# Patient Record
Sex: Female | Born: 1955
Health system: Southern US, Community
[De-identification: ages and names within clinical notes are randomized; demographics above are authoritative.]

## PROBLEM LIST (undated history)

## (undated) DIAGNOSIS — J309 Allergic rhinitis, unspecified: Secondary | ICD-10-CM

## (undated) DIAGNOSIS — K529 Noninfective gastroenteritis and colitis, unspecified: Secondary | ICD-10-CM

## (undated) DIAGNOSIS — K219 Gastro-esophageal reflux disease without esophagitis: Secondary | ICD-10-CM

## (undated) DIAGNOSIS — M459 Ankylosing spondylitis of unspecified sites in spine: Secondary | ICD-10-CM

## (undated) DIAGNOSIS — G4730 Sleep apnea, unspecified: Secondary | ICD-10-CM

## (undated) DIAGNOSIS — M2550 Pain in unspecified joint: Secondary | ICD-10-CM

## (undated) DIAGNOSIS — E669 Obesity, unspecified: Secondary | ICD-10-CM

## (undated) DIAGNOSIS — R252 Cramp and spasm: Secondary | ICD-10-CM

## (undated) DIAGNOSIS — R109 Unspecified abdominal pain: Secondary | ICD-10-CM

## (undated) DIAGNOSIS — Z9884 Bariatric surgery status: Secondary | ICD-10-CM

## (undated) DIAGNOSIS — D7289 Other specified disorders of white blood cells: Secondary | ICD-10-CM

## (undated) DIAGNOSIS — R599 Enlarged lymph nodes, unspecified: Secondary | ICD-10-CM

## (undated) DIAGNOSIS — Z8639 Personal history of other endocrine, nutritional and metabolic disease: Secondary | ICD-10-CM

## (undated) DIAGNOSIS — IMO0001 Reserved for inherently not codable concepts without codable children: Secondary | ICD-10-CM

## (undated) DIAGNOSIS — M1990 Unspecified osteoarthritis, unspecified site: Secondary | ICD-10-CM

## (undated) DIAGNOSIS — R079 Chest pain, unspecified: Secondary | ICD-10-CM

## (undated) DIAGNOSIS — Z5189 Encounter for other specified aftercare: Secondary | ICD-10-CM

## (undated) DIAGNOSIS — I10 Essential (primary) hypertension: Secondary | ICD-10-CM

## (undated) DIAGNOSIS — M549 Dorsalgia, unspecified: Secondary | ICD-10-CM

## (undated) DIAGNOSIS — K449 Diaphragmatic hernia without obstruction or gangrene: Secondary | ICD-10-CM

## (undated) DIAGNOSIS — M069 Rheumatoid arthritis, unspecified: Secondary | ICD-10-CM

## (undated) DIAGNOSIS — E041 Nontoxic single thyroid nodule: Secondary | ICD-10-CM

## (undated) DIAGNOSIS — G4733 Obstructive sleep apnea (adult) (pediatric): Principal | ICD-10-CM

## (undated) DIAGNOSIS — Z862 Personal history of diseases of the blood and blood-forming organs and certain disorders involving the immune mechanism: Secondary | ICD-10-CM

## (undated) DIAGNOSIS — K2970 Gastritis, unspecified, without bleeding: Secondary | ICD-10-CM

## (undated) DIAGNOSIS — E559 Vitamin D deficiency, unspecified: Secondary | ICD-10-CM

## (undated) DIAGNOSIS — S058X9A Other injuries of unspecified eye and orbit, initial encounter: Secondary | ICD-10-CM

## (undated) DIAGNOSIS — E162 Hypoglycemia, unspecified: Secondary | ICD-10-CM

## (undated) DIAGNOSIS — K921 Melena: Secondary | ICD-10-CM

## (undated) DIAGNOSIS — M450 Ankylosing spondylitis of multiple sites in spine: Secondary | ICD-10-CM

## (undated) DIAGNOSIS — K5900 Constipation, unspecified: Secondary | ICD-10-CM

## (undated) DIAGNOSIS — IMO0002 Reserved for concepts with insufficient information to code with codable children: Secondary | ICD-10-CM

## (undated) DIAGNOSIS — M79669 Pain in unspecified lower leg: Secondary | ICD-10-CM

## (undated) DIAGNOSIS — K589 Irritable bowel syndrome without diarrhea: Secondary | ICD-10-CM

## (undated) DIAGNOSIS — E785 Hyperlipidemia, unspecified: Secondary | ICD-10-CM

## (undated) DIAGNOSIS — J302 Other seasonal allergic rhinitis: Secondary | ICD-10-CM

## (undated) DIAGNOSIS — E739 Lactose intolerance, unspecified: Secondary | ICD-10-CM

## (undated) HISTORY — DX: Melena: K92.1

## (undated) HISTORY — DX: Diaphragmatic hernia without obstruction or gangrene: K44.9

## (undated) HISTORY — DX: Sleep apnea, unspecified: G47.30

## (undated) HISTORY — DX: Dorsalgia, unspecified: M54.9

## (undated) HISTORY — PX: UPPER GASTROINTESTINAL ENDOSCOPY: SHX188

## (undated) HISTORY — DX: Noninfective gastroenteritis and colitis, unspecified: K52.9

## (undated) HISTORY — DX: Pain in unspecified lower leg: M79.669

## (undated) HISTORY — DX: Cramp and spasm: R25.2

## (undated) HISTORY — DX: Unspecified osteoarthritis, unspecified site: M19.90

## (undated) HISTORY — DX: Personal history of diseases of the blood and blood-forming organs and certain disorders involving the immune mechanism: Z86.2

## (undated) HISTORY — DX: Allergic rhinitis, unspecified: J30.9

## (undated) HISTORY — DX: Reserved for concepts with insufficient information to code with codable children: IMO0002

## (undated) HISTORY — PX: TOTAL HIP ARTHROPLASTY: SHX124

## (undated) HISTORY — DX: Nontoxic single thyroid nodule: E04.1

## (undated) HISTORY — DX: Lactose intolerance, unspecified: E73.9

## (undated) HISTORY — DX: Ankylosing spondylitis of multiple sites in spine: M45.0

## (undated) HISTORY — DX: Hypoglycemia, unspecified: E16.2

## (undated) HISTORY — DX: Obstructive sleep apnea (adult) (pediatric): G47.33

## (undated) HISTORY — PX: BARIATRIC SURGERY: SHX1103

## (undated) HISTORY — DX: Hyperlipidemia, unspecified: E78.5

## (undated) HISTORY — DX: Essential (primary) hypertension: I10

## (undated) HISTORY — DX: Unspecified abdominal pain: R10.9

## (undated) HISTORY — DX: Chest pain, unspecified: R07.9

## (undated) HISTORY — DX: Rheumatoid arthritis, unspecified: M06.9

## (undated) HISTORY — DX: Other specified disorders of white blood cells: D72.89

## (undated) HISTORY — DX: Gastro-esophageal reflux disease without esophagitis: K21.9

## (undated) HISTORY — DX: Pain in unspecified joint: M25.50

## (undated) HISTORY — DX: Morbid (severe) obesity due to excess calories: E66.01

## (undated) HISTORY — DX: Irritable bowel syndrome without diarrhea: K58.9

## (undated) HISTORY — DX: Encounter for other specified aftercare: Z51.89

## (undated) HISTORY — PX: ESOPHAGOGASTRODUODENOSCOPY: SHX1529

## (undated) HISTORY — DX: Gastritis, unspecified, without bleeding: K29.70

## (undated) HISTORY — PX: COLONOSCOPY: SHX174

## (undated) HISTORY — DX: Ankylosing spondylitis of unspecified sites in spine: M45.9

## (undated) HISTORY — DX: Other seasonal allergic rhinitis: J30.2

## (undated) HISTORY — DX: Bariatric surgery status: Z98.84

## (undated) HISTORY — DX: Vitamin D deficiency, unspecified: E55.9

## (undated) HISTORY — DX: Other injuries of unspecified eye and orbit, initial encounter: S05.8X9A

## (undated) HISTORY — DX: Enlarged lymph nodes, unspecified: R59.9

## (undated) HISTORY — DX: Constipation, unspecified: K59.00

## (undated) HISTORY — DX: Reserved for inherently not codable concepts without codable children: IMO0001

## (undated) HISTORY — DX: Personal history of other endocrine, nutritional and metabolic disease: Z86.39

## (undated) HISTORY — DX: Obesity, unspecified: E66.9

---

## 1994-09-30 HISTORY — PX: BREAST REDUCTION SURGERY: SHX8

## 1998-03-29 ENCOUNTER — Emergency Department (HOSPITAL_COMMUNITY): Admission: EM | Admit: 1998-03-29 | Discharge: 1998-03-29 | Payer: Self-pay | Admitting: Emergency Medicine

## 1998-08-19 ENCOUNTER — Emergency Department (HOSPITAL_COMMUNITY): Admission: EM | Admit: 1998-08-19 | Discharge: 1998-08-19 | Payer: Self-pay | Admitting: Emergency Medicine

## 1998-10-10 ENCOUNTER — Ambulatory Visit (HOSPITAL_COMMUNITY): Admission: RE | Admit: 1998-10-10 | Discharge: 1998-10-10 | Payer: Self-pay | Admitting: Obstetrics & Gynecology

## 1998-10-10 ENCOUNTER — Encounter: Payer: Self-pay | Admitting: Obstetrics & Gynecology

## 1998-10-16 ENCOUNTER — Encounter: Payer: Self-pay | Admitting: Obstetrics & Gynecology

## 1998-10-16 ENCOUNTER — Ambulatory Visit (HOSPITAL_COMMUNITY): Admission: RE | Admit: 1998-10-16 | Discharge: 1998-10-16 | Payer: Self-pay | Admitting: Obstetrics & Gynecology

## 1998-10-20 ENCOUNTER — Encounter: Payer: Self-pay | Admitting: Obstetrics & Gynecology

## 1998-10-20 ENCOUNTER — Ambulatory Visit (HOSPITAL_COMMUNITY): Admission: RE | Admit: 1998-10-20 | Discharge: 1998-10-20 | Payer: Self-pay | Admitting: Obstetrics & Gynecology

## 1999-05-16 ENCOUNTER — Encounter: Payer: Self-pay | Admitting: Obstetrics & Gynecology

## 1999-05-16 ENCOUNTER — Ambulatory Visit (HOSPITAL_COMMUNITY): Admission: RE | Admit: 1999-05-16 | Discharge: 1999-05-16 | Payer: Self-pay | Admitting: Psychology

## 1999-11-20 ENCOUNTER — Ambulatory Visit (HOSPITAL_COMMUNITY): Admission: RE | Admit: 1999-11-20 | Discharge: 1999-11-20 | Payer: Self-pay | Admitting: Obstetrics & Gynecology

## 1999-11-20 ENCOUNTER — Encounter: Payer: Self-pay | Admitting: Obstetrics & Gynecology

## 2000-04-01 ENCOUNTER — Encounter: Payer: Self-pay | Admitting: Cardiology

## 2000-04-01 ENCOUNTER — Ambulatory Visit (HOSPITAL_COMMUNITY): Admission: RE | Admit: 2000-04-01 | Discharge: 2000-04-01 | Payer: Self-pay | Admitting: Cardiology

## 2000-10-23 ENCOUNTER — Ambulatory Visit (HOSPITAL_COMMUNITY): Admission: RE | Admit: 2000-10-23 | Discharge: 2000-10-23 | Payer: Self-pay | Admitting: Cardiology

## 2000-10-23 ENCOUNTER — Encounter: Payer: Self-pay | Admitting: Cardiology

## 2001-02-25 ENCOUNTER — Ambulatory Visit (HOSPITAL_COMMUNITY): Admission: RE | Admit: 2001-02-25 | Discharge: 2001-02-25 | Payer: Self-pay | Admitting: Obstetrics and Gynecology

## 2001-02-25 ENCOUNTER — Encounter: Payer: Self-pay | Admitting: Obstetrics and Gynecology

## 2001-04-15 ENCOUNTER — Emergency Department (HOSPITAL_COMMUNITY): Admission: EM | Admit: 2001-04-15 | Discharge: 2001-04-15 | Payer: Self-pay | Admitting: Emergency Medicine

## 2001-04-17 ENCOUNTER — Encounter: Payer: Self-pay | Admitting: Cardiology

## 2001-04-17 ENCOUNTER — Ambulatory Visit (HOSPITAL_COMMUNITY): Admission: RE | Admit: 2001-04-17 | Discharge: 2001-04-17 | Payer: Self-pay | Admitting: Cardiology

## 2001-04-25 ENCOUNTER — Encounter: Payer: Self-pay | Admitting: Cardiology

## 2001-04-25 ENCOUNTER — Encounter: Admission: RE | Admit: 2001-04-25 | Discharge: 2001-04-25 | Payer: Self-pay | Admitting: Cardiology

## 2001-05-01 ENCOUNTER — Encounter: Admission: RE | Admit: 2001-05-01 | Discharge: 2001-07-30 | Payer: Self-pay | Admitting: Cardiology

## 2001-05-04 ENCOUNTER — Other Ambulatory Visit: Admission: RE | Admit: 2001-05-04 | Discharge: 2001-05-04 | Payer: Self-pay | Admitting: Obstetrics and Gynecology

## 2001-06-13 ENCOUNTER — Emergency Department (HOSPITAL_COMMUNITY): Admission: EM | Admit: 2001-06-13 | Discharge: 2001-06-13 | Payer: Self-pay | Admitting: Emergency Medicine

## 2002-01-08 ENCOUNTER — Encounter: Payer: Self-pay | Admitting: Gastroenterology

## 2002-01-08 ENCOUNTER — Ambulatory Visit (HOSPITAL_COMMUNITY): Admission: RE | Admit: 2002-01-08 | Discharge: 2002-01-08 | Payer: Self-pay | Admitting: Gastroenterology

## 2002-06-04 ENCOUNTER — Other Ambulatory Visit: Admission: RE | Admit: 2002-06-04 | Discharge: 2002-06-04 | Payer: Self-pay | Admitting: Obstetrics and Gynecology

## 2002-07-12 ENCOUNTER — Encounter: Payer: Self-pay | Admitting: Obstetrics and Gynecology

## 2002-07-12 ENCOUNTER — Ambulatory Visit (HOSPITAL_COMMUNITY): Admission: RE | Admit: 2002-07-12 | Discharge: 2002-07-12 | Payer: Self-pay | Admitting: Obstetrics and Gynecology

## 2002-12-16 ENCOUNTER — Encounter: Payer: Self-pay | Admitting: Emergency Medicine

## 2002-12-16 ENCOUNTER — Emergency Department (HOSPITAL_COMMUNITY): Admission: EM | Admit: 2002-12-16 | Discharge: 2002-12-16 | Payer: Self-pay | Admitting: Emergency Medicine

## 2003-05-05 ENCOUNTER — Other Ambulatory Visit: Admission: RE | Admit: 2003-05-05 | Discharge: 2003-05-05 | Payer: Self-pay | Admitting: Obstetrics and Gynecology

## 2003-05-31 ENCOUNTER — Encounter: Admission: RE | Admit: 2003-05-31 | Discharge: 2003-08-29 | Payer: Self-pay | Admitting: Cardiology

## 2003-08-18 ENCOUNTER — Encounter: Admission: RE | Admit: 2003-08-18 | Discharge: 2003-09-21 | Payer: Self-pay | Admitting: Internal Medicine

## 2003-11-01 ENCOUNTER — Encounter: Admission: RE | Admit: 2003-11-01 | Discharge: 2003-11-01 | Payer: Self-pay | Admitting: Obstetrics and Gynecology

## 2004-04-30 ENCOUNTER — Ambulatory Visit (HOSPITAL_COMMUNITY): Admission: RE | Admit: 2004-04-30 | Discharge: 2004-04-30 | Payer: Self-pay | Admitting: General Surgery

## 2004-05-15 ENCOUNTER — Ambulatory Visit (HOSPITAL_COMMUNITY): Admission: RE | Admit: 2004-05-15 | Discharge: 2004-05-15 | Payer: Self-pay | Admitting: Obstetrics and Gynecology

## 2004-05-16 ENCOUNTER — Ambulatory Visit (HOSPITAL_COMMUNITY): Admission: RE | Admit: 2004-05-16 | Discharge: 2004-05-16 | Payer: Self-pay | Admitting: General Surgery

## 2004-05-16 ENCOUNTER — Encounter: Payer: Self-pay | Admitting: Cardiology

## 2004-05-21 ENCOUNTER — Encounter: Admission: RE | Admit: 2004-05-21 | Discharge: 2004-05-21 | Payer: Self-pay | Admitting: General Surgery

## 2004-08-16 ENCOUNTER — Emergency Department (HOSPITAL_COMMUNITY): Admission: EM | Admit: 2004-08-16 | Discharge: 2004-08-16 | Payer: Self-pay | Admitting: Family Medicine

## 2004-09-29 ENCOUNTER — Emergency Department (HOSPITAL_COMMUNITY): Admission: EM | Admit: 2004-09-29 | Discharge: 2004-09-29 | Payer: Self-pay | Admitting: Emergency Medicine

## 2004-10-09 ENCOUNTER — Other Ambulatory Visit: Admission: RE | Admit: 2004-10-09 | Discharge: 2004-10-09 | Payer: Self-pay | Admitting: Obstetrics and Gynecology

## 2005-01-30 ENCOUNTER — Ambulatory Visit: Payer: Self-pay | Admitting: Gastroenterology

## 2005-03-05 ENCOUNTER — Ambulatory Visit: Payer: Self-pay | Admitting: Gastroenterology

## 2005-06-13 ENCOUNTER — Emergency Department (HOSPITAL_COMMUNITY): Admission: EM | Admit: 2005-06-13 | Discharge: 2005-06-13 | Payer: Self-pay | Admitting: *Deleted

## 2005-06-14 ENCOUNTER — Emergency Department (HOSPITAL_COMMUNITY): Admission: EM | Admit: 2005-06-14 | Discharge: 2005-06-14 | Payer: Self-pay | Admitting: Emergency Medicine

## 2005-06-15 ENCOUNTER — Emergency Department (HOSPITAL_COMMUNITY): Admission: EM | Admit: 2005-06-15 | Discharge: 2005-06-16 | Payer: Self-pay | Admitting: Emergency Medicine

## 2005-06-18 ENCOUNTER — Encounter: Admission: RE | Admit: 2005-06-18 | Discharge: 2005-06-18 | Payer: Self-pay | Admitting: Internal Medicine

## 2005-07-07 ENCOUNTER — Emergency Department (HOSPITAL_COMMUNITY): Admission: EM | Admit: 2005-07-07 | Discharge: 2005-07-08 | Payer: Self-pay | Admitting: Emergency Medicine

## 2005-07-08 ENCOUNTER — Encounter: Admission: RE | Admit: 2005-07-08 | Discharge: 2005-09-29 | Payer: Self-pay | Admitting: General Surgery

## 2005-07-13 ENCOUNTER — Emergency Department (HOSPITAL_COMMUNITY): Admission: AD | Admit: 2005-07-13 | Discharge: 2005-07-13 | Payer: Self-pay | Admitting: Family Medicine

## 2005-07-14 ENCOUNTER — Emergency Department (HOSPITAL_COMMUNITY): Admission: EM | Admit: 2005-07-14 | Discharge: 2005-07-14 | Payer: Self-pay | Admitting: Emergency Medicine

## 2005-08-31 ENCOUNTER — Encounter: Admission: RE | Admit: 2005-08-31 | Discharge: 2005-08-31 | Payer: Self-pay | Admitting: Internal Medicine

## 2005-09-03 ENCOUNTER — Ambulatory Visit (HOSPITAL_COMMUNITY): Admission: RE | Admit: 2005-09-03 | Discharge: 2005-09-03 | Payer: Self-pay | Admitting: Internal Medicine

## 2005-10-28 ENCOUNTER — Emergency Department (HOSPITAL_COMMUNITY): Admission: EM | Admit: 2005-10-28 | Discharge: 2005-10-28 | Payer: Self-pay | Admitting: Emergency Medicine

## 2005-12-16 ENCOUNTER — Encounter: Admission: RE | Admit: 2005-12-16 | Discharge: 2005-12-16 | Payer: Self-pay | Admitting: Internal Medicine

## 2005-12-26 ENCOUNTER — Other Ambulatory Visit: Admission: RE | Admit: 2005-12-26 | Discharge: 2005-12-26 | Payer: Self-pay | Admitting: Obstetrics and Gynecology

## 2006-05-29 ENCOUNTER — Ambulatory Visit: Payer: Self-pay | Admitting: Family Medicine

## 2006-06-05 ENCOUNTER — Ambulatory Visit: Payer: Self-pay | Admitting: Family Medicine

## 2006-06-23 ENCOUNTER — Ambulatory Visit: Payer: Self-pay | Admitting: Family Medicine

## 2006-08-04 ENCOUNTER — Ambulatory Visit: Payer: Self-pay | Admitting: Family Medicine

## 2006-08-26 ENCOUNTER — Ambulatory Visit: Payer: Self-pay | Admitting: Cardiology

## 2006-08-27 ENCOUNTER — Ambulatory Visit: Payer: Self-pay | Admitting: Family Medicine

## 2006-08-27 LAB — CONVERTED CEMR LAB
ALT: 48 units/L — ABNORMAL HIGH (ref 0–40)
Albumin: 3.8 g/dL (ref 3.5–5.2)
Alkaline Phosphatase: 87 units/L (ref 39–117)
Hemoglobin: 13.2 g/dL (ref 12.0–15.0)
MCHC: 33.6 g/dL (ref 30.0–36.0)
Monocytes Absolute: 0.5 10*3/uL (ref 0.2–0.7)
Monocytes Relative: 12.9 % — ABNORMAL HIGH (ref 3.0–11.0)
Platelets: 242 10*3/uL (ref 150–400)
RBC: 4.05 M/uL (ref 3.87–5.11)
RDW: 11.9 % (ref 11.5–14.6)
Total Bilirubin: 0.9 mg/dL (ref 0.3–1.2)

## 2006-09-01 ENCOUNTER — Ambulatory Visit: Payer: Self-pay | Admitting: Family Medicine

## 2006-09-12 ENCOUNTER — Ambulatory Visit: Payer: Self-pay | Admitting: Oncology

## 2006-09-15 ENCOUNTER — Ambulatory Visit (HOSPITAL_COMMUNITY): Admission: RE | Admit: 2006-09-15 | Discharge: 2006-09-15 | Payer: Self-pay | Admitting: Obstetrics and Gynecology

## 2006-10-15 ENCOUNTER — Ambulatory Visit: Payer: Self-pay | Admitting: Endocrinology

## 2006-11-12 ENCOUNTER — Ambulatory Visit: Payer: Self-pay | Admitting: Oncology

## 2006-11-17 LAB — CBC WITH DIFFERENTIAL/PLATELET
Basophils Absolute: 0 10*3/uL (ref 0.0–0.1)
EOS%: 1.9 % (ref 0.0–7.0)
HCT: 39.1 % (ref 34.8–46.6)
HGB: 13.3 g/dL (ref 11.6–15.9)
MCH: 32.6 pg (ref 26.0–34.0)
MCV: 95.5 fL (ref 81.0–101.0)
MONO%: 12.2 % (ref 0.0–13.0)
NEUT%: 43 % (ref 39.6–76.8)
Platelets: 256 10*3/uL (ref 145–400)

## 2006-11-18 LAB — COMPREHENSIVE METABOLIC PANEL
Albumin: 4 g/dL (ref 3.5–5.2)
Alkaline Phosphatase: 100 U/L (ref 39–117)
BUN: 20 mg/dL (ref 6–23)
Glucose, Bld: 88 mg/dL (ref 70–99)
Potassium: 4.2 mEq/L (ref 3.5–5.3)
Total Bilirubin: 0.6 mg/dL (ref 0.3–1.2)

## 2006-11-18 LAB — ANA: Anti Nuclear Antibody(ANA): NEGATIVE

## 2006-12-10 ENCOUNTER — Ambulatory Visit: Payer: Self-pay | Admitting: Family Medicine

## 2007-02-03 ENCOUNTER — Ambulatory Visit: Payer: Self-pay | Admitting: Family Medicine

## 2007-02-04 ENCOUNTER — Ambulatory Visit: Payer: Self-pay | Admitting: Family Medicine

## 2007-02-04 LAB — CONVERTED CEMR LAB
Basophils Absolute: 0 10*3/uL (ref 0.0–0.1)
Bilirubin, Direct: 0.1 mg/dL (ref 0.0–0.3)
CO2: 30 meq/L (ref 19–32)
Eosinophils Absolute: 0.1 10*3/uL (ref 0.0–0.6)
GFR calc Af Amer: 169 mL/min
Glucose, Bld: 87 mg/dL (ref 70–99)
HCT: 38.6 % (ref 36.0–46.0)
Hemoglobin: 13.2 g/dL (ref 12.0–15.0)
Iron: 113 ug/dL (ref 42–145)
MCHC: 34.2 g/dL (ref 30.0–36.0)
MCV: 96.2 fL (ref 78.0–100.0)
Monocytes Absolute: 0.5 10*3/uL (ref 0.2–0.7)
Monocytes Relative: 10 % (ref 3.0–11.0)
Neutrophils Relative %: 35.1 % — ABNORMAL LOW (ref 43.0–77.0)
Potassium: 3.8 meq/L (ref 3.5–5.1)
RBC: 4.02 M/uL (ref 3.87–5.11)
Saturation Ratios: 27.9 % (ref 20.0–50.0)
Total Protein: 6.7 g/dL (ref 6.0–8.3)
Transferrin: 289.1 mg/dL (ref >=212.0)
Zinc: 769 (ref 600–1200)

## 2007-04-30 ENCOUNTER — Encounter: Admission: RE | Admit: 2007-04-30 | Discharge: 2007-06-30 | Payer: Self-pay | Admitting: General Surgery

## 2007-06-10 ENCOUNTER — Ambulatory Visit: Payer: Self-pay | Admitting: Family Medicine

## 2007-06-10 DIAGNOSIS — R599 Enlarged lymph nodes, unspecified: Secondary | ICD-10-CM | POA: Insufficient documentation

## 2007-06-10 DIAGNOSIS — Z862 Personal history of diseases of the blood and blood-forming organs and certain disorders involving the immune mechanism: Secondary | ICD-10-CM

## 2007-06-10 DIAGNOSIS — Z8639 Personal history of other endocrine, nutritional and metabolic disease: Secondary | ICD-10-CM

## 2007-06-10 HISTORY — DX: Enlarged lymph nodes, unspecified: R59.9

## 2007-06-10 HISTORY — DX: Personal history of diseases of the blood and blood-forming organs and certain disorders involving the immune mechanism: Z86.2

## 2007-06-10 HISTORY — DX: Personal history of diseases of the blood and blood-forming organs and certain disorders involving the immune mechanism: Z86.39

## 2007-06-26 ENCOUNTER — Encounter: Payer: Self-pay | Admitting: *Deleted

## 2007-06-26 DIAGNOSIS — K589 Irritable bowel syndrome without diarrhea: Secondary | ICD-10-CM

## 2007-06-26 DIAGNOSIS — K219 Gastro-esophageal reflux disease without esophagitis: Secondary | ICD-10-CM

## 2007-06-26 DIAGNOSIS — I10 Essential (primary) hypertension: Secondary | ICD-10-CM | POA: Insufficient documentation

## 2007-06-26 DIAGNOSIS — J309 Allergic rhinitis, unspecified: Secondary | ICD-10-CM | POA: Insufficient documentation

## 2007-06-26 DIAGNOSIS — E041 Nontoxic single thyroid nodule: Secondary | ICD-10-CM

## 2007-06-26 HISTORY — DX: Gastro-esophageal reflux disease without esophagitis: K21.9

## 2007-06-26 HISTORY — DX: Allergic rhinitis, unspecified: J30.9

## 2007-06-26 HISTORY — DX: Morbid (severe) obesity due to excess calories: E66.01

## 2007-06-26 HISTORY — DX: Nontoxic single thyroid nodule: E04.1

## 2007-06-26 HISTORY — DX: Irritable bowel syndrome, unspecified: K58.9

## 2007-07-06 ENCOUNTER — Encounter: Admission: RE | Admit: 2007-07-06 | Discharge: 2007-07-06 | Payer: Self-pay | Admitting: Family Medicine

## 2007-07-15 ENCOUNTER — Telehealth: Payer: Self-pay | Admitting: Family Medicine

## 2007-07-16 ENCOUNTER — Encounter: Payer: Self-pay | Admitting: Family Medicine

## 2007-11-05 ENCOUNTER — Ambulatory Visit (HOSPITAL_COMMUNITY): Admission: RE | Admit: 2007-11-05 | Discharge: 2007-11-05 | Payer: Self-pay | Admitting: Obstetrics and Gynecology

## 2008-01-14 ENCOUNTER — Ambulatory Visit: Payer: Self-pay | Admitting: Family Medicine

## 2008-01-14 DIAGNOSIS — Z9884 Bariatric surgery status: Secondary | ICD-10-CM

## 2008-01-14 HISTORY — DX: Bariatric surgery status: Z98.84

## 2008-01-19 ENCOUNTER — Encounter: Admission: RE | Admit: 2008-01-19 | Discharge: 2008-01-19 | Payer: Self-pay | Admitting: Family Medicine

## 2008-01-20 ENCOUNTER — Encounter (INDEPENDENT_AMBULATORY_CARE_PROVIDER_SITE_OTHER): Payer: Self-pay | Admitting: *Deleted

## 2008-01-20 ENCOUNTER — Telehealth (INDEPENDENT_AMBULATORY_CARE_PROVIDER_SITE_OTHER): Payer: Self-pay | Admitting: *Deleted

## 2008-01-25 ENCOUNTER — Ambulatory Visit: Payer: Self-pay | Admitting: Family Medicine

## 2008-01-27 ENCOUNTER — Encounter (INDEPENDENT_AMBULATORY_CARE_PROVIDER_SITE_OTHER): Payer: Self-pay | Admitting: *Deleted

## 2008-01-27 LAB — CONVERTED CEMR LAB
AST: 38 units/L — ABNORMAL HIGH (ref 0–37)
Albumin: 4.4 g/dL (ref 3.5–5.2)
BUN: 9 mg/dL (ref 6–23)
Chloride: 105 meq/L (ref 96–112)
Cholesterol: 153 mg/dL (ref 0–200)
Creatinine, Ser: 0.6 mg/dL (ref 0.4–1.2)
Free T4: 0.9 ng/dL (ref 0.6–1.6)
GFR calc non Af Amer: 113 mL/min
HDL: 92.4 mg/dL (ref >39.0)
MCHC: 33.3 g/dL (ref 30.0–36.0)
MCV: 97.1 fL (ref 78.0–100.0)
Platelets: 274 10*3/uL (ref 150–400)
RBC: 4.53 M/uL (ref 3.87–5.11)
Total Protein: 7.9 g/dL (ref 6.0–8.3)
VLDL: 12 mg/dL (ref 0–40)
Vit D, 1,25-Dihydroxy: 43 (ref 30–89)
Vitamin B-12: 1047 pg/mL — ABNORMAL HIGH (ref 211–911)

## 2008-01-28 ENCOUNTER — Telehealth (INDEPENDENT_AMBULATORY_CARE_PROVIDER_SITE_OTHER): Payer: Self-pay | Admitting: *Deleted

## 2008-01-29 ENCOUNTER — Ambulatory Visit: Payer: Self-pay | Admitting: Family Medicine

## 2008-02-09 ENCOUNTER — Encounter (INDEPENDENT_AMBULATORY_CARE_PROVIDER_SITE_OTHER): Payer: Self-pay | Admitting: *Deleted

## 2008-02-10 ENCOUNTER — Encounter: Payer: Self-pay | Admitting: Internal Medicine

## 2008-02-10 ENCOUNTER — Telehealth (INDEPENDENT_AMBULATORY_CARE_PROVIDER_SITE_OTHER): Payer: Self-pay | Admitting: *Deleted

## 2008-02-13 LAB — CONVERTED CEMR LAB
Basophils Absolute: 0 10*3/uL (ref 0.0–0.1)
Eosinophils Absolute: 0.1 10*3/uL (ref 0.0–0.7)
HCT: 40.8 % (ref 36.0–46.0)
MCHC: 33.2 g/dL (ref 30.0–36.0)
MCV: 97.9 fL (ref 78.0–100.0)
Monocytes Absolute: 0.5 10*3/uL (ref 0.1–1.0)
Platelets: 267 10*3/uL (ref 150–400)
RDW: 12 % (ref 11.5–14.6)

## 2008-02-15 ENCOUNTER — Encounter (INDEPENDENT_AMBULATORY_CARE_PROVIDER_SITE_OTHER): Payer: Self-pay | Admitting: *Deleted

## 2008-03-01 ENCOUNTER — Encounter: Payer: Self-pay | Admitting: Family Medicine

## 2008-03-24 ENCOUNTER — Encounter: Payer: Self-pay | Admitting: Family Medicine

## 2008-03-24 ENCOUNTER — Encounter: Admission: RE | Admit: 2008-03-24 | Discharge: 2008-03-24 | Payer: Self-pay | Admitting: Endocrinology

## 2008-03-24 ENCOUNTER — Encounter (INDEPENDENT_AMBULATORY_CARE_PROVIDER_SITE_OTHER): Payer: Self-pay | Admitting: Interventional Radiology

## 2008-03-24 ENCOUNTER — Other Ambulatory Visit: Admission: RE | Admit: 2008-03-24 | Discharge: 2008-03-24 | Payer: Self-pay | Admitting: Interventional Radiology

## 2008-03-31 ENCOUNTER — Ambulatory Visit: Payer: Self-pay | Admitting: Internal Medicine

## 2008-03-31 DIAGNOSIS — R109 Unspecified abdominal pain: Secondary | ICD-10-CM

## 2008-03-31 DIAGNOSIS — IMO0001 Reserved for inherently not codable concepts without codable children: Secondary | ICD-10-CM

## 2008-03-31 DIAGNOSIS — M459 Ankylosing spondylitis of unspecified sites in spine: Secondary | ICD-10-CM | POA: Insufficient documentation

## 2008-03-31 HISTORY — DX: Unspecified abdominal pain: R10.9

## 2008-03-31 HISTORY — DX: Reserved for inherently not codable concepts without codable children: IMO0001

## 2008-03-31 HISTORY — DX: Ankylosing spondylitis of unspecified sites in spine: M45.9

## 2008-03-31 LAB — CONVERTED CEMR LAB
Bilirubin Urine: NEGATIVE
Blood in Urine, dipstick: NEGATIVE
Glucose, Urine, Semiquant: NEGATIVE
Ketones, urine, test strip: NEGATIVE
Nitrite: NEGATIVE
Protein, U semiquant: NEGATIVE
Specific Gravity, Urine: 1.02
Urobilinogen, UA: 0.2
pH: 6

## 2008-04-01 ENCOUNTER — Encounter: Payer: Self-pay | Admitting: Internal Medicine

## 2008-04-04 ENCOUNTER — Encounter (INDEPENDENT_AMBULATORY_CARE_PROVIDER_SITE_OTHER): Payer: Self-pay | Admitting: *Deleted

## 2008-04-20 ENCOUNTER — Encounter: Admission: RE | Admit: 2008-04-20 | Discharge: 2008-06-21 | Payer: Self-pay | Admitting: General Surgery

## 2008-05-09 ENCOUNTER — Encounter: Payer: Self-pay | Admitting: Family Medicine

## 2008-05-10 ENCOUNTER — Encounter: Payer: Self-pay | Admitting: Internal Medicine

## 2008-06-04 ENCOUNTER — Emergency Department (HOSPITAL_BASED_OUTPATIENT_CLINIC_OR_DEPARTMENT_OTHER): Admission: EM | Admit: 2008-06-04 | Discharge: 2008-06-04 | Payer: Self-pay | Admitting: Emergency Medicine

## 2008-08-17 ENCOUNTER — Telehealth: Payer: Self-pay | Admitting: Family Medicine

## 2008-09-07 ENCOUNTER — Encounter: Payer: Self-pay | Admitting: Family Medicine

## 2008-09-12 ENCOUNTER — Inpatient Hospital Stay (HOSPITAL_COMMUNITY): Admission: RE | Admit: 2008-09-12 | Discharge: 2008-09-16 | Payer: Self-pay | Admitting: Orthopedic Surgery

## 2008-09-24 ENCOUNTER — Inpatient Hospital Stay (HOSPITAL_COMMUNITY): Admission: EM | Admit: 2008-09-24 | Discharge: 2008-10-04 | Payer: Self-pay | Admitting: Emergency Medicine

## 2008-09-24 ENCOUNTER — Encounter (INDEPENDENT_AMBULATORY_CARE_PROVIDER_SITE_OTHER): Payer: Self-pay | Admitting: Emergency Medicine

## 2008-09-24 ENCOUNTER — Ambulatory Visit: Payer: Self-pay | Admitting: Vascular Surgery

## 2008-09-29 ENCOUNTER — Ambulatory Visit: Payer: Self-pay | Admitting: Physical Medicine & Rehabilitation

## 2008-09-30 HISTORY — PX: OTHER SURGICAL HISTORY: SHX169

## 2008-12-02 ENCOUNTER — Ambulatory Visit: Payer: Self-pay | Admitting: Family Medicine

## 2008-12-02 DIAGNOSIS — K921 Melena: Secondary | ICD-10-CM

## 2008-12-02 HISTORY — DX: Melena: K92.1

## 2008-12-03 LAB — CONVERTED CEMR LAB
AST: 29 units/L (ref 0–37)
Albumin: 3.9 g/dL (ref 3.5–5.2)
Alkaline Phosphatase: 83 units/L (ref 39–117)
BUN: 8 mg/dL (ref 6–23)
Bilirubin, Direct: 0.1 mg/dL (ref 0.0–0.3)
Chloride: 104 meq/L (ref 96–112)
Eosinophils Absolute: 0.1 10*3/uL (ref 0.0–0.7)
Eosinophils Relative: 2.1 % (ref 0.0–5.0)
GFR calc Af Amer: 168 mL/min
GFR calc non Af Amer: 139 mL/min
Glucose, Bld: 86 mg/dL (ref 70–99)
Monocytes Relative: 9.5 % (ref 3.0–12.0)
Neutrophils Relative %: 46.6 % (ref 43.0–77.0)
Platelets: 248 10*3/uL (ref 150–400)
Potassium: 3.8 meq/L (ref 3.5–5.1)
RDW: 13.6 % (ref 11.5–14.6)
Sodium: 140 meq/L (ref 135–145)
WBC: 4.4 10*3/uL — ABNORMAL LOW (ref 4.5–10.5)

## 2008-12-05 ENCOUNTER — Encounter (INDEPENDENT_AMBULATORY_CARE_PROVIDER_SITE_OTHER): Payer: Self-pay | Admitting: *Deleted

## 2008-12-14 ENCOUNTER — Telehealth: Payer: Self-pay | Admitting: Family Medicine

## 2009-01-06 ENCOUNTER — Telehealth (INDEPENDENT_AMBULATORY_CARE_PROVIDER_SITE_OTHER): Payer: Self-pay | Admitting: *Deleted

## 2009-01-24 ENCOUNTER — Encounter: Payer: Self-pay | Admitting: Family Medicine

## 2009-01-30 ENCOUNTER — Ambulatory Visit: Payer: Self-pay | Admitting: Family Medicine

## 2009-01-30 DIAGNOSIS — E162 Hypoglycemia, unspecified: Secondary | ICD-10-CM | POA: Insufficient documentation

## 2009-02-03 ENCOUNTER — Emergency Department (HOSPITAL_COMMUNITY): Admission: EM | Admit: 2009-02-03 | Discharge: 2009-02-03 | Payer: Self-pay | Admitting: Family Medicine

## 2009-03-01 ENCOUNTER — Telehealth (INDEPENDENT_AMBULATORY_CARE_PROVIDER_SITE_OTHER): Payer: Self-pay | Admitting: *Deleted

## 2009-03-17 ENCOUNTER — Ambulatory Visit: Payer: Self-pay | Admitting: Family Medicine

## 2009-03-22 ENCOUNTER — Ambulatory Visit: Payer: Self-pay | Admitting: Family Medicine

## 2009-03-29 ENCOUNTER — Emergency Department (HOSPITAL_COMMUNITY): Admission: EM | Admit: 2009-03-29 | Discharge: 2009-03-29 | Payer: Self-pay | Admitting: Family Medicine

## 2009-03-29 ENCOUNTER — Telehealth (INDEPENDENT_AMBULATORY_CARE_PROVIDER_SITE_OTHER): Payer: Self-pay | Admitting: *Deleted

## 2009-03-30 ENCOUNTER — Encounter (INDEPENDENT_AMBULATORY_CARE_PROVIDER_SITE_OTHER): Payer: Self-pay | Admitting: *Deleted

## 2009-03-30 LAB — CONVERTED CEMR LAB
ALT: 41 units/L — ABNORMAL HIGH (ref 0–35)
Alkaline Phosphatase: 78 units/L (ref 39–117)
BUN: 7 mg/dL (ref 6–23)
Basophils Relative: 0.1 % (ref 0.0–3.0)
Bilirubin, Direct: 0 mg/dL (ref 0.0–0.3)
Calcium: 9.3 mg/dL (ref 8.4–10.5)
Chloride: 102 meq/L (ref 96–112)
Cholesterol: 132 mg/dL (ref 0–200)
Creatinine, Ser: 0.5 mg/dL (ref 0.4–1.2)
Eosinophils Relative: 2.2 % (ref 0.0–5.0)
GFR calc non Af Amer: 167.61 mL/min (ref >60)
H Pylori IgG: NEGATIVE
LDL Cholesterol: 40 mg/dL (ref 0–99)
Lymphocytes Relative: 47.7 % — ABNORMAL HIGH (ref 12.0–46.0)
MCV: 96.3 fL (ref 78.0–100.0)
Monocytes Absolute: 0.4 10*3/uL (ref 0.1–1.0)
Monocytes Relative: 8.6 % (ref 3.0–12.0)
Neutrophils Relative %: 41.4 % — ABNORMAL LOW (ref 43.0–77.0)
Platelets: 248 10*3/uL (ref 150.0–400.0)
RBC: 3.96 M/uL (ref 3.87–5.11)
Saturation Ratios: 27.9 % (ref 20.0–50.0)
Total Bilirubin: 0.8 mg/dL (ref 0.3–1.2)
Total CHOL/HDL Ratio: 2
Total Protein: 7.7 g/dL (ref 6.0–8.3)
Transferrin: 271.7 mg/dL (ref 212.0–360.0)
Triglycerides: 50 mg/dL (ref 0.0–149.0)
VLDL: 10 mg/dL (ref 0.0–40.0)
Vitamin B-12: 495 pg/mL (ref 211–911)
WBC: 4.4 10*3/uL — ABNORMAL LOW (ref 4.5–10.5)

## 2009-04-25 ENCOUNTER — Encounter: Admission: RE | Admit: 2009-04-25 | Discharge: 2009-07-11 | Payer: Self-pay | Admitting: Orthopaedic Surgery

## 2009-06-07 ENCOUNTER — Ambulatory Visit: Payer: Self-pay | Admitting: Diagnostic Radiology

## 2009-06-07 ENCOUNTER — Emergency Department (HOSPITAL_BASED_OUTPATIENT_CLINIC_OR_DEPARTMENT_OTHER): Admission: EM | Admit: 2009-06-07 | Discharge: 2009-06-07 | Payer: Self-pay | Admitting: Emergency Medicine

## 2009-08-01 ENCOUNTER — Encounter: Admission: RE | Admit: 2009-08-01 | Discharge: 2009-09-14 | Payer: Self-pay | Admitting: Orthopaedic Surgery

## 2009-09-07 ENCOUNTER — Encounter: Payer: Self-pay | Admitting: Family Medicine

## 2009-09-20 ENCOUNTER — Telehealth: Payer: Self-pay | Admitting: Family Medicine

## 2009-10-11 ENCOUNTER — Ambulatory Visit: Payer: Self-pay | Admitting: Gastroenterology

## 2009-10-11 DIAGNOSIS — R079 Chest pain, unspecified: Secondary | ICD-10-CM

## 2009-10-11 HISTORY — DX: Chest pain, unspecified: R07.9

## 2009-10-17 ENCOUNTER — Ambulatory Visit (HOSPITAL_COMMUNITY): Admission: RE | Admit: 2009-10-17 | Discharge: 2009-10-17 | Payer: Self-pay | Admitting: Obstetrics and Gynecology

## 2009-10-19 ENCOUNTER — Telehealth: Payer: Self-pay | Admitting: Gastroenterology

## 2009-10-23 ENCOUNTER — Telehealth (INDEPENDENT_AMBULATORY_CARE_PROVIDER_SITE_OTHER): Payer: Self-pay | Admitting: *Deleted

## 2009-10-23 ENCOUNTER — Telehealth: Payer: Self-pay | Admitting: Gastroenterology

## 2009-10-24 ENCOUNTER — Ambulatory Visit: Payer: Self-pay | Admitting: Gastroenterology

## 2009-10-24 HISTORY — PX: COLONOSCOPY: SHX174

## 2010-01-22 ENCOUNTER — Ambulatory Visit: Payer: Self-pay | Admitting: Family Medicine

## 2010-01-22 DIAGNOSIS — S058X9A Other injuries of unspecified eye and orbit, initial encounter: Secondary | ICD-10-CM

## 2010-01-22 HISTORY — DX: Other injuries of unspecified eye and orbit, initial encounter: S05.8X9A

## 2010-01-23 ENCOUNTER — Telehealth: Payer: Self-pay | Admitting: Family Medicine

## 2010-04-06 ENCOUNTER — Ambulatory Visit: Payer: Self-pay | Admitting: Family Medicine

## 2010-04-06 DIAGNOSIS — K5900 Constipation, unspecified: Secondary | ICD-10-CM

## 2010-04-06 HISTORY — DX: Constipation, unspecified: K59.00

## 2010-05-08 ENCOUNTER — Encounter: Admission: RE | Admit: 2010-05-08 | Discharge: 2010-06-25 | Payer: Self-pay | Admitting: Orthopaedic Surgery

## 2010-06-22 ENCOUNTER — Ambulatory Visit: Payer: Self-pay | Admitting: Family Medicine

## 2010-06-27 ENCOUNTER — Ambulatory Visit: Payer: Self-pay | Admitting: Family Medicine

## 2010-06-27 LAB — CONVERTED CEMR LAB
Bilirubin Urine: NEGATIVE
Glucose, Urine, Semiquant: NEGATIVE
Protein, U semiquant: NEGATIVE
Urobilinogen, UA: 0.2
WBC Urine, dipstick: NEGATIVE
pH: 6

## 2010-07-02 LAB — CONVERTED CEMR LAB
Alkaline Phosphatase: 94 units/L (ref 39–117)
Basophils Absolute: 0 10*3/uL (ref 0.0–0.1)
Bilirubin, Direct: 0.1 mg/dL (ref 0.0–0.3)
Calcium: 9.1 mg/dL (ref 8.4–10.5)
GFR calc non Af Amer: 140.52 mL/min (ref >60)
HCT: 41.4 % (ref 36.0–46.0)
HDL: 83.4 mg/dL (ref >39.00)
LDL Cholesterol: 60 mg/dL (ref 0–99)
Lymphs Abs: 1.9 10*3/uL (ref 0.7–4.0)
MCV: 98.7 fL (ref 78.0–100.0)
Monocytes Absolute: 0.3 10*3/uL (ref 0.1–1.0)
Neutrophils Relative %: 32.4 % — ABNORMAL LOW (ref 43.0–77.0)
Platelets: 263 10*3/uL (ref 150.0–400.0)
Potassium: 4 meq/L (ref 3.5–5.1)
RDW: 12.8 % (ref 11.5–14.6)
Sodium: 141 meq/L (ref 135–145)
TSH: 1.32 microintl units/mL (ref 0.35–5.50)
Total Bilirubin: 0.6 mg/dL (ref 0.3–1.2)
Total CHOL/HDL Ratio: 2
Transferrin: 328.6 mg/dL (ref 212.0–360.0)
VLDL: 9.6 mg/dL (ref 0.0–40.0)
Vit D, 25-Hydroxy: 36 ng/mL

## 2010-07-23 ENCOUNTER — Ambulatory Visit: Payer: Self-pay | Admitting: Family Medicine

## 2010-07-23 DIAGNOSIS — D7289 Other specified disorders of white blood cells: Secondary | ICD-10-CM

## 2010-07-23 HISTORY — DX: Other specified disorders of white blood cells: D72.89

## 2010-07-25 LAB — CONVERTED CEMR LAB
Basophils Relative: 0.7 % (ref 0.0–3.0)
Eosinophils Relative: 1.5 % (ref 0.0–5.0)
Hemoglobin: 14.1 g/dL (ref 12.0–15.0)
MCV: 97.7 fL (ref 78.0–100.0)
Monocytes Absolute: 0.3 10*3/uL (ref 0.1–1.0)
Neutrophils Relative %: 43.4 % (ref 43.0–77.0)
RBC: 4.27 M/uL (ref 3.87–5.11)
WBC: 4.2 10*3/uL — ABNORMAL LOW (ref 4.5–10.5)

## 2010-10-21 ENCOUNTER — Encounter: Payer: Self-pay | Admitting: Endocrinology

## 2010-10-21 ENCOUNTER — Encounter: Payer: Self-pay | Admitting: Obstetrics and Gynecology

## 2010-10-21 ENCOUNTER — Encounter: Payer: Self-pay | Admitting: Gastroenterology

## 2010-10-22 ENCOUNTER — Encounter: Payer: Self-pay | Admitting: Internal Medicine

## 2010-11-01 NOTE — Assessment & Plan Note (Signed)
Summary: PAIN IN CHEST AREA/NON CARDIAC/YF   History of Present Illness Visit Type: Initial Consult Primary GI MD: Melvia Heaps MD [facility] Primary Provider: Loreen Freud, MD Chief Complaint: Patient c/o intermittent chest discomfort since having bariatric surgery. She c/o increased burping as well. She denies any other GI symptoms. History of Present Illness:   [name] has returned for evaluation of chest pain.  She has been complaining of intermittent burning chest discomfort.  She denies dysphagia.  She has a history of GERD and an esophageal stricture which has been dilated in the past.  She has taken Nexium intermittently with relief.  Chest discomfort is relieved with belching.  She is status post gastric bypass surgery in 2006   GI Review of Systems    Reports belching, chest pain, and  weight gain.      Denies abdominal pain, acid reflux, bloating, dysphagia with liquids, dysphagia with solids, heartburn, loss of appetite, nausea, vomiting, vomiting blood, and  weight loss.      Reports constipation and  rectal bleeding.     Denies anal fissure, black tarry stools, change in bowel habit, diarrhea, diverticulosis, fecal incontinence, heme positive stool, hemorrhoids, irritable bowel syndrome, jaundice, light color stool, liver problems, and  rectal pain. Preventive Screening-Counseling & Management  Alcohol-Tobacco     Smoking Status: never      Drug Use:  no.      Current Medications (verified): 1)  One Touch Ultra Mini Strips and Lancets .... Check Bs Once Daily 2)  Tylenol Extra Strength 500 Mg Tabs (Acetaminophen) .... Take 2-4 Tablets By Mouth As Needed 3)  Centrum  Tabs (Multiple Vitamins-Minerals) .... Take 1 Tablet By Mouth Once A Day 4)  Calcium (Unknown Dosage) .... Take 1 Tablet By Mouth Once A Day 5)  Potassium (Unknown Dosage) .... Take 1 Tablet By Mouth Once A Day 6)  Vitamin D (Unknown Dosage) .... Take 1 Tablet By Mouth Once A Day  Allergies (verified): 1)   ! Codeine 2)  ! Morphine 3)  ! Sulfa 4)  ! Jonne Ply  Past History:  Past Medical History: Reviewed history from 04/05/2009 and no changes required. 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  Alcohol Use - no Daily Caffeine Use- 1 cup daily Illicit Drug Use - no Drug Use:  no  Review of Systems       The patient complains of arthritis/joint pain, heart rhythm changes, and night sweats.         All other systems were reviewed and were negative   Vital Signs:  Patient profile:   55 year old female Height:      61 inches Weight:      168 pounds BMI:     31.86 BSA:     1.76 Pulse rate:   80 / minute Pulse rhythm:   regular BP sitting:   120 / 78  (left arm)  Vitals Entered By: Hortense Ramal CMA Duncan Dull) (October 11, 2009 3:58 PM)  Physical Exam  Additional Exam:  She is a well-developed well-nourished female  skin: anicteric HEENT: normocephalic; PEERLA; no nasal or pharyngeal abnormalities neck: supple nodes: no cervical lymphadenopathy chest: clear to ausculatation and percussion heart: no murmurs, gallops, or rubs abd: soft, nontender; BS normoactive; no abdominal masses, tenderness, organomegaly rectal: deferred ext: no cynanosis, clubbing, edema skeletal: no deformities neuro: oriented x 3; no focal abnormalities    Impression & Recommendations:  Problem  # 1:  CHEST PAIN UNSPECIFIED (ICD-786.50) Symptoms are most likely related to GERD.  Recommendations #1 the patient was instructed to use Nexium daily regardless of symptoms.  If not improved I would consider upper endoscopy.  Problem # 2:  SPECIAL SCREENING FOR MALIGNANT NEOPLASMS COLON (ICD-V76.51)  Patient will be scheduled for screening colonoscopy  Orders: Colonoscopy (Colon)  Patient Instructions: 1)  Your Colonoscopy is scheduled for 10/24/2009 at 8am 2)  You can pick up your MoviPrep from your pharmacy today 3)  The medication list was reviewed and reconciled.  All changed / newly prescribed medications were explained.  A complete medication list was provided to the patient / caregiver. Prescriptions: MOVIPREP 100 GM  SOLR (PEG-KCL-NACL-NASULF-NA ASC-C) As per prep instructions.  #1 x 0   Entered by:   Merri Ray CMA (AAMA)   Authorized by:   Louis Meckel MD   Signed by:   Merri Ray CMA (AAMA) on 10/11/2009   Method used:   Electronically to        [facility]  [address] [phone]* (retail)       [address]       [place], [place]  [id number]       Ph: [phone] or [phone]       Fax: [phone]   RxID:   [id number] NEXIUM 40 MG CPDR (ESOMEPRAZOLE MAGNESIUM) take one tab daily  #30 x 2   Entered and Authorized by:   Louis Meckel MD   Signed by:   Louis Meckel MD on 10/11/2009   Method used:   [facility]ically to        [facility]  [address] [phone]* (retail)       [address]       [place], [place]  [id number]       Ph: [phone] or [phone]       Fax: [phone]   RxID:   [id number]

## 2010-11-01 NOTE — Progress Notes (Signed)
Summary: Prep Questions  Phone Note Call from Patient Call back at Work Phone [phone]   Caller: Patient Call For: Dr. Arlyce Dice Reason for Call: Talk to Nurse Summary of Call: Pt has an appt. scheduled for a Colonoscopy on 10-24-09 and has had gastic bypass surgery. Cannot drink alot of fluids and wants to know if there is another option she can do. Initial call taken by: Karna Christmas,  October 19, 2009 9:24 AM  Follow-up for Phone Call        Explained moviprep and pt is okay to start it a little earlier to get it all down in time. Follow-up by: Clide Cliff RN,  October 19, 2009 11:29 AM

## 2010-11-01 NOTE — Assessment & Plan Note (Signed)
Summary: cpx/cbs   Vital Signs:  Patient profile:   55 year old female Height:      62.5 inches Weight:      180.8 pounds Temp:     98.7 degrees F oral Pulse rate:   68 / minute Pulse rhythm:   regular BP sitting:   130 / 86  (left arm)  Vitals Entered By: Almeta Monas CMA Con Arganbright Dull) (June 22, 2010 2:05 PM) CC: CPX, No PAP needed and not fasting   History of Present Illness: Pt here for cpe, no pap --and labs will be done another day.    Preventive Screening-Counseling & Management  Alcohol-Tobacco     Alcohol drinks/day: 0     Smoking Status: never  Caffeine-Diet-Exercise     Caffeine use/day: 2     Does Patient Exercise: no     Exercise Counseling: to improve exercise regimen  Hep-HIV-STD-Contraception     Dental Visit-last 6 months yes     Dental Care Counseling: not indicated; dental care within six months     SBE monthly: no     SBE Education/Counseling: to perform regular SBE      Sexual History:  currently monogamous and married.    Current Medications (verified): 1)  One Touch Ultra Mini Strips and Lancets .... Check Bs Once Daily 2)  Tylenol Extra Strength 500 Mg Tabs (Acetaminophen) .... Take 2-4 Tablets By Mouth As Needed 3)  Centrum  Tabs (Multiple Vitamins-Minerals) .... Take 1 Tablet By Mouth Once A Day 4)  Calcium (Unknown Dosage) .... Take 1 Tablet By Mouth Once A Day 5)  Potassium (Unknown Dosage) .... Take 1 Tablet By Mouth Once A Day 6)  Vitamin D (Unknown Dosage) .... Take 1 Tablet By Mouth Once A Day 7)  Nexium 40 Mg Cpdr (Esomeprazole Magnesium) .... Take One Tab Daily 8)  Adipex-P 37.5 Mg Caps (Phentermine Hcl) .Marland Kitchen.. 1 By Mouth Once Daily  Allergies (verified): 1)  ! Codeine 2)  ! Morphine 3)  ! Sulfa 4)  ! 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  Alcohol Use - no Daily Caffeine Use- 1 cup daily Illicit Drug Use - no  Risk Factors: Alcohol Use: 0 (06/22/2010) Caffeine Use: 2 (06/22/2010) Exercise: no (06/22/2010)  Risk Factors: Smoking Status: never (06/22/2010)  Past Surgical History: EGD(03/05/2005) Breast surgery reduction 1996 Left Total hip replacement x 2 Left Hip replacement revision (1st hip failed due to cracked pelvis) Bariatric Surgery EGD 04/2010 colon 01/2010  Family History: Reviewed history from 10/11/2009 and no changes required. Father: LIVING Mother: LIVING Siblings: 1 LIVING Family History of Melanoma-MGM Family History Breast cancer 1st degree relative <50-MGM, Aunt Family History of Arthritis-FATHER Family History Hypertension: MOTHER, FATHER, SISTER Family History Diabetes 1st degree relative: FATHER,SISTER No FH of Colon Cancer:  Social History: Reviewed history from 10/11/2009 and no changes required. 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  Alcohol Use - no Daily Caffeine Use- 1 cup daily Illicit Drug Use - no Does Patient Exercise:  no Caffeine use/day:  2 Dental Care w/in 6 mos.:  yes Sexual History:  currently monogamous, married  Review of Systems      See HPI General:  Denies chills, fatigue, fever, loss of appetite, malaise, sleep disorder, sweats, weakness, and weight loss. Eyes:  Denies blurring, discharge, double vision, eye irritation, eye pain, halos, itching, light sensitivity, red eye, vision loss-1 eye, and vision loss-both eyes; optho q1y. ENT:  Denies decreased hearing, difficulty swallowing, ear  discharge, earache, hoarseness, nasal congestion, nosebleeds, postnasal drainage, ringing in ears, sinus pressure, and sore throat. CV:  Denies bluish discoloration of lips or nails, chest pain or discomfort, difficulty breathing at night, difficulty breathing while lying down, fainting, fatigue, leg cramps with exertion, lightheadness, near fainting, palpitations, shortness of breath with exertion, swelling of feet, swelling of hands, and weight gain. Resp:  Denies chest discomfort, chest pain with inspiration, cough, coughing up blood, excessive snoring, hypersomnolence, morning headaches, pleuritic, shortness of breath, sputum productive, and wheezing. GI:  Denies abdominal pain, bloody stools, change in bowel habits, constipation, dark tarry stools, diarrhea, excessive appetite, gas, hemorrhoids, indigestion, loss of appetite, nausea, vomiting, vomiting blood, and yellowish skin color. GU:  Denies abnormal vaginal bleeding, decreased libido, discharge, dysuria, genital sores, hematuria, incontinence, nocturia, urinary frequency, and urinary hesitancy. MS:  Denies joint pain, joint redness, joint swelling, loss of strength, low back pain, mid back pain, muscle aches, muscle , cramps, muscle weakness, stiffness, and thoracic pain. Derm:  Denies changes in color of skin, changes in nail beds, dryness, excessive perspiration, flushing, hair loss, insect bite(s), itching, lesion(s), poor wound healing, and rash. Neuro:  Complains of memory loss; denies brief paralysis, difficulty with concentration, disturbances in coordination, falling down, headaches, inability to speak, numbness, poor balance, seizures, sensation of room spinning, tingling, tremors, visual disturbances, and weakness. Psych:  Denies alternate hallucination ( auditory/visual), anxiety, depression, easily angered, easily tearful, irritability, mental problems, panic attacks, sense of great danger, suicidal thoughts/plans, thoughts of  violence, unusual visions or sounds, and thoughts /plans of harming others. Heme:  Denies abnormal bruising, bleeding, enlarge lymph nodes, fevers, pallor, and skin discoloration. Allergy:  Denies hives or rash, itching eyes, persistent infections, seasonal allergies, and sneezing.  Physical Exam  General:  Well-developed,well-nourished,in no acute distress; alert,appropriate and cooperative throughout examination Head:  Normocephalic and atraumatic without obvious abnormalities. No apparent alopecia or balding. Eyes:  pupils equal, pupils round, pupils reactive to light, and no injection.   Ears:  External ear exam shows no significant lesions or deformities.  Otoscopic examination reveals clear canals, tympanic membranes are intact bilaterally without bulging, retraction, inflammation or discharge. Hearing is grossly normal bilaterally. Nose:  External nasal examination shows no deformity or inflammation. Nasal mucosa are pink and moist without lesions or exudates. Mouth:  Oral mucosa and oropharynx without lesions or exudates.  Teeth in good repair. Neck:  No deformities, masses, or tenderness noted. Chest Wall:  No deformities, masses, or tenderness noted. Breasts:  No mass, nodules, thickening, tenderness, bulging, retraction, inflamation, nipple discharge or skin changes noted.   Lungs:  Normal respiratory effort, chest expands symmetrically. Lungs are clear to auscultation, no crackles or wheezes. Heart:  normal rate and no murmur.   Abdomen:  Bowel sounds positive,abdomen soft and non-tender without masses, organomegaly or hernias noted. Rectal:  gyn Genitalia:  gyn Msk:  No deformity or scoliosis noted of thoracic or lumbar spine.   Pulses:  R and L carotid,radial,femoral,dorsalis pedis and posterior tibial pulses are full and equal bilaterally Extremities:  No clubbing, cyanosis, edema, or deformity noted with normal full range of motion  of all joints.   Neurologic:  No cranial nerve  deficits noted. Station and gait are normal. Plantar reflexes are down-going bilaterally. DTRs are symmetrical throughout. Sensory, motor and coordinative functions appear intact. Skin:  Intact without suspicious lesions or rashes Cervical Nodes:  No lymphadenopathy noted Axillary Nodes:  No palpable lymphadenopathy Psych:  Cognition and judgment appear intact. Alert and cooperative with normal attention span and concentration. No apparent delusions, illusions, hallucinations   Impression & Recommendations:  Problem # 1:  Preventive Health Care (ICD-V70.0) check fasing labs ghm utd pap mammo per gyn Orders: EKG w/ Interpretation (93000)  Problem # 2:  BARIATRIC SURGERY STATUS (ICD-V45.86) check labs Orders: EKG w/ Interpretation (93000)  Problem # 3:  HYPERTENSION (ICD-401.9)  Orders: EKG w/ Interpretation (93000)  BP today: 130/86 Prior BP: 118/80 (04/06/2010)  Labs Reviewed: K+: 4.1 (03/22/2009) Creat: : 0.5 (03/22/2009)   Chol: 132 (03/22/2009)   HDL: 81.90 (03/22/2009)   LDL: 40 (03/22/2009)   TG: 50.0 (03/22/2009)  Problem # 4:  GERD (ICD-530.81)  Her updated medication list for this problem includes:    Nexium 40 Mg Cpdr (Esomeprazole magnesium) .Marland Kitchen... Take one tab daily  Orders: EKG w/ Interpretation (93000)  Diagnostics Reviewed:  EGD: Findings: Gastritis  Location: [facility]   (03/05/2005) Discussed lifestyle modifications, diet, antacids/medications, and preventive measures. Handout provided.   Problem # 5:  MORBID OBESITY (ICD-278.01) d/w pt diet and exercise---unable to do much exercise secondary to hip pt will join weight watchers adipex 37.5 daily--rto 1 month Ht: 62.5 (06/22/2010)   Wt: 180.8 (06/22/2010)   BMI: 31.86 (10/11/2009)  Complete Medication List: 1)  One Touch Ultra Mini Strips and Lancets  .... Check bs once daily 2)  Tylenol Extra Strength 500 Mg Tabs (Acetaminophen) .... Take 2-4 tablets by mouth as needed 3)  Centrum  Tabs (Multiple vitamins-minerals) .... Take 1 tablet by mouth once a day 4)  Calcium (unknown Dosage)  .... Take 1 tablet by mouth once a day 5)  Potassium (unknown Dosage)  .... Take 1 tablet by mouth once a day 6)  Vitamin D (unknown Dosage)  .... Take 1 tablet by mouth once a day 7)  Nexium 40 Mg Cpdr (Esomeprazole magnesium) .... Take one tab daily 8)  Adipex-p 37.5 Mg Caps (Phentermine hcl) .Marland Kitchen.. 1 by mouth once daily  Other Orders: Admin 1st Vaccine (21308) Flu Vaccine 61yrs + (65784)  Patient Instructions: 1)  V70.0  V45.86-----  CBCD,  bmp, hep, lipid, TSH, UA--, vita D, vita B2, ibc, ferritin 2)  rto 1 month Prescriptions: NEXIUM 40 MG CPDR (ESOMEPRAZOLE MAGNESIUM) take one tab daily  #90 x 3   Entered and Authorized by:   Loreen Freud DO   Signed by:   Loreen Freud DO on 06/22/2010   Method used:   Faxed to ...       [facility])             , [place]         Ph: [phone]       Fax: [phone]   RxID:   [id number] NEXIUM 40 MG CPDR (ESOMEPRAZOLE MAGNESIUM) take one tab daily  #30 x 11   Entered and Authorized by:   Loreen Freud DO   Signed by:   Loreen Freud DO on 06/22/2010   Method used:   Print then Give to Patient   RxID:   [id number] ADIPEX-P 37.5 MG CAPS (PHENTERMINE HCL) 1 by mouth once daily  #30 x 0  Entered and Authorized by:   Loreen Freud DO   Signed by:   Loreen Freud DO on 06/22/2010   Method used:   Print then Give to Patient   RxID:   [id number]    EKG  Procedure date:  06/22/2010  Findings:      Normal sinus rhythm with rate of:  73 bpm    PAP Result Date:  06/06/2010 PAP Result:  normal PAP Next Due:  1 yr Mammogram Result Date:  05/10/2009 Mammogram Result:  normal Mammogram Next Due:  1 yr Flu Vaccine Consent Questions     Do you have a history of severe allergic reactions to this vaccine? no    Any prior history of allergic reactions to egg and/or gelatin? no    Do you have a sensitivity to the preservative  Thimersol? no    Do you have a past history of Guillan-Barre Syndrome? no    Do you currently have an acute febrile illness? no    Have you ever had a severe reaction to latex? no    Vaccine information given and explained to patient? yes    Are you currently pregnant? no    Lot Number:[id number]   Exp Date:03/30/2011   Site Given  Left Deltoid IM PAP Next Due:  1 yr Mammogram Result Date:  05/10/2009 Mammogram Result:  normal Mammogram Next Due:  1 yr   .lbflu

## 2010-11-01 NOTE — Assessment & Plan Note (Signed)
Summary: 1 MONTH FOLLOWUP/KN   Vital Signs:  Patient profile:   55 year old female Height:      62.5 inches Weight:      178.6 pounds BMI:     32.26 Temp:     97.2 degrees F oral Pulse rate:   68 / minute Pulse rhythm:   regular BP sitting:   114 / 72  (right arm) Cuff size:   regular  Vitals Entered By: Almeta Monas CMA Duncan Dull) (July 23, 2010 10:35 AM) CC: 1 mo f/u for weight check   History of Present Illness: Pt here for weight check.  It has not quite been a month.   Pt doing well with diet.  She is still getting munchies.   Current Medications (verified): 1)  One Touch Ultra Mini Strips and Lancets .... Check Bs Once Daily 2)  Tylenol Extra Strength 500 Mg Tabs (Acetaminophen) .... Take 2-4 Tablets By Mouth As Needed 3)  Centrum  Tabs (Multiple Vitamins-Minerals) .... Take 1 Tablet By Mouth Once A Day 4)  Calcium (Unknown Dosage) .... Take 1 Tablet By Mouth Once A Day 5)  Potassium (Unknown Dosage) .... Take 1 Tablet By Mouth Once A Day 6)  Vitamin D (Unknown Dosage) .... Take 1 Tablet By Mouth Once A Day 7)  Nexium 40 Mg Cpdr (Esomeprazole Magnesium) .... Take One Tab Daily 8)  Adipex-P 37.5 Mg Caps (Phentermine Hcl) .Marland Kitchen.. 1 By Mouth Once Daily  Allergies (verified): 1)  ! Codeine 2)  ! Morphine 3)  ! Sulfa 4)  ! Asa  Past History:  Past medical, surgical, family and social histories (including risk factors) reviewed for relevance to current acute and chronic problems.  Past Medical History: Reviewed history from 04/05/2009 and no changes required. GERD Hypertension Arthritis Gastritis Hiatal Hernia  Past Surgical History: Reviewed history from 06/22/2010 and no changes required. EGD(03/05/2005) Breast surgery reduction 1996 Left Total hip replacement x 2 Left Hip replacement revision (1st hip failed due to cracked pelvis) Bariatric Surgery EGD 04/2010 colon 01/2010  Family History: Reviewed history from 10/11/2009 and no changes required. Father:  LIVING Mother: LIVING Siblings: 1 LIVING Family History of Melanoma-MGM Family History Breast cancer 1st degree relative <50-MGM, Aunt Family History of Arthritis-FATHER Family History Hypertension: MOTHER, FATHER, SISTER Family History Diabetes 1st degree relative: FATHER,SISTER No FH of Colon Cancer:  Social History: Reviewed history from 10/11/2009 and no changes required. 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  Alcohol Use - no Daily Caffeine Use- 1 cup daily Illicit Drug Use - no  Review of Systems      See HPI  Physical Exam  General:  Well-developed,well-nourished,in no acute distress; alert,appropriate and cooperative throughout examination Lungs:  Normal respiratory effort, chest expands symmetrically. Lungs are clear to auscultation, no crackles or wheezes. Heart:  normal rate and no murmur.   Psych:  Oriented X3 and normally interactive.     Impression & Recommendations:  Problem # 1:  MORBID OBESITY (ICD-278.01) Assessment Improved refill adipex try decaff coffee at night con't diet and exercise Ht: 62.5 (07/23/2010)   Wt: 178.6 (07/23/2010)   BMI: 32.26 (07/23/2010)  Problem # 2:  OTHER SPECIFIED DISEASE OF WHITE BLOOD CELLS (ICD-288.8)  Orders: Venipuncture (09323) TLB-CBC Platelet - w/Differential (85025-CBCD)  Complete Medication List: 1)  One Touch Ultra Mini Strips and Lancets  .... Check bs once daily 2)  Tylenol Extra Strength 500 Mg Tabs (Acetaminophen) .... Take 2-4 tablets by mouth as needed 3)  Centrum Tabs (Multiple  vitamins-minerals) .... Take 1 tablet by mouth once a day 4)  Calcium (unknown Dosage)  .... Take 1 tablet by mouth once a day 5)  Potassium (unknown Dosage)  .... Take 1 tablet by mouth once a day 6)  Vitamin D (unknown Dosage)  .... Take 1 tablet by mouth once a day 7)  Nexium 40 Mg Cpdr (Esomeprazole magnesium) .... Take one tab daily 8)  Adipex-p 37.5 Mg Caps (Phentermine hcl) .Marland Kitchen.. 1 by mouth once  daily  Patient Instructions: 1)  Please schedule a follow-up appointment in 1 month.  Prescriptions: ADIPEX-P 37.5 MG CAPS (PHENTERMINE HCL) 1 by mouth once daily  #30 x 0   Entered and Authorized by:   Loreen Freud DO   Signed by:   Loreen Freud DO on 07/23/2010   Method used:   Print then Give to Patient   RxID:   [id number]    Orders Added: 1)  Venipuncture [14782] 2)  TLB-CBC Platelet - w/Differential [85025-CBCD] 3)  Est. Patient Level III [95621]  Appended Document: 1 MONTH FOLLOWUP/KN

## 2010-11-01 NOTE — Assessment & Plan Note (Signed)
Summary: STOMACH ISSUES//KN   Vital Signs:  Patient profile:   55 year old female Height:      61 inches Weight:      175 pounds Temp:     98.1 degrees F oral Pulse rate:   66 / minute BP sitting:   118 / 80  (left arm)  Vitals Entered By: Jeremy Johann CMA (April 06, 2010 10:18 AM) CC: constipation, abdominal pain x3weeks Comments REVIEWED MED LIST, PATIENT AGREED DOSE AND INSTRUCTION CORRECT    History of Present Illness: Pt here c/o constipation and abd pain for 3 weeks.  Pt had colon in March and is having EGD next week with surgeon in [place].  Symptomes  for longer than the 3 weeks.  No fever.  Current Medications (verified): 1)  One Touch Ultra Mini Strips and Lancets .... Check Bs Once Daily 2)  Tylenol Extra Strength 500 Mg Tabs (Acetaminophen) .... Take 2-4 Tablets By Mouth As Needed 3)  Centrum  Tabs (Multiple Vitamins-Minerals) .... Take 1 Tablet By Mouth Once A Day 4)  Calcium (Unknown Dosage) .... Take 1 Tablet By Mouth Once A Day 5)  Potassium (Unknown Dosage) .... Take 1 Tablet By Mouth Once A Day 6)  Vitamin D (Unknown Dosage) .... Take 1 Tablet By Mouth Once A Day 7)  Nexium 40 Mg Cpdr (Esomeprazole Magnesium) .... Take One Tab Daily  Allergies: 1)  ! Codeine 2)  ! Morphine 3)  ! Sulfa 4)  ! Asa  Past History:  Past medical, surgical, family and social histories (including risk factors) reviewed for relevance to current acute and chronic problems.  Past Medical History: Reviewed history from 04/05/2009 and no changes required. GERD Hypertension Arthritis Gastritis Hiatal Hernia  Past Surgical History: Reviewed history from 10/11/2009 and no changes required. EGD(03/05/2005) Breast surgery reduction 1996 Left Total hip replacement x 2 Left Hip replacement revision (1st hip failed due to cracked pelvis) Bariatric Surgery  Family History: Reviewed history from 10/11/2009 and no changes required. Father: LIVING Mother: LIVING Siblings: 1  LIVING Family History of Melanoma-MGM Family History Breast cancer 1st degree relative <50-MGM, Aunt Family History of Arthritis-FATHER Family History Hypertension: MOTHER, FATHER, SISTER Family History Diabetes 1st degree relative: FATHER,SISTER No FH of Colon Cancer:  Social History: Reviewed history from 10/11/2009 and no changes required. 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  Alcohol Use - no Daily Caffeine Use- 1 cup daily Illicit Drug Use - no  Review of Systems      See HPI  Physical Exam  General:  Well-developed,well-nourished,in no acute distress; alert,appropriate and cooperative throughout examination Lungs:  Normal respiratory effort, chest expands symmetrically. Lungs are clear to auscultation, no crackles or wheezes. Heart:  normal rate and no murmur.   Abdomen:  Bowel sounds positive,abdomen soft and non-tender without masses, organomegaly or hernias noted. Extremities:  No clubbing, cyanosis, edema, or deformity noted with normal full range of motion of all joints.   Skin:  Intact without suspicious lesions or rashes Psych:  Cognition and judgment appear intact. Alert and cooperative with normal attention span and concentration. No apparent delusions, illusions, hallucinations   Impression & Recommendations:  Problem # 1:  ABDOMINAL PAIN OTHER SPECIFIED SITE (ICD-789.09) Increase nexium to bid Her updated medication list for this problem includes:    Tylenol Extra Strength 500 Mg Tabs (Acetaminophen) .Marland Kitchen... Take 2-4 tablets by mouth as needed  Discussed use of medications, application of heat or cold, and exercises.   Problem # 2:  CONSTIPATION (ICD-564.00) miralax  water increase fiber Discussed dietary fiber measures and increased water intake.   Complete Medication List: 1)  One Touch Ultra Mini Strips and Lancets  .... Check bs once daily 2)  Tylenol Extra Strength 500 Mg Tabs (Acetaminophen) .... Take 2-4 tablets by mouth as  needed 3)  Centrum Tabs (Multiple vitamins-minerals) .... Take 1 tablet by mouth once a day 4)  Calcium (unknown Dosage)  .... Take 1 tablet by mouth once a day 5)  Potassium (unknown Dosage)  .... Take 1 tablet by mouth once a day 6)  Vitamin D (unknown Dosage)  .... Take 1 tablet by mouth once a day 7)  Nexium 40 Mg Cpdr (Esomeprazole magnesium) .... Take one tab daily

## 2010-11-01 NOTE — Progress Notes (Signed)
Summary: Med Change  Phone Note Call from Patient   Summary of Call: Pt was prescribed Vigamox yesterday, she cannot afford. Is there an alternative med she can use for her mouth? Army Fossa CMA  January 23, 2010 3:37 PM   Follow-up for Phone Call        ciloxan sent to pharmacy Follow-up by: Loreen Freud DO,  January 23, 2010 4:32 PM  Additional Follow-up for Phone Call Additional follow up Details #1::        Left message that we sent in a new medication. Army Fossa CMA  January 23, 2010 4:34 PM     New/Updated Medications: CILOXAN 0.3 % SOLN (CIPROFLOXACIN HCL) 2 gtt into affected eye q4 hours Prescriptions: CILOXAN 0.3 % SOLN (CIPROFLOXACIN HCL) 2 gtt into affected eye q4 hours  #7 days x 0   Entered and Authorized by:   Loreen Freud DO   Signed by:   Loreen Freud DO on 01/23/2010   Method used:   Electronically to        [facility]  [address] [phone][facility] (retail)       [address]       [place], [place]  [id number]       Ph: [phone] or [phone]       Fax: [phone]   RxID:   [id number]

## 2010-11-01 NOTE — Letter (Signed)
Summary: [facility] Instructions  [facility]  [address] [place], [place] [address]   Phone: [phone]  Fax: [phone]       [name]    [birth year]    MRN: [id number]        Procedure Day /Date:TUESDAY 10/24/2009     Arrival Time:7:30AM     Procedure Time:8:00AM     Location of Procedure:                    X  [facility] (4th Floor)   PREPARATION FOR COLONOSCOPY WITH MOVIPREP   Starting 5 days prior to your procedure 10/19/2009 do not eat nuts, seeds, popcorn, corn, beans, peas,  salads, or any raw vegetables.  Do not take any fiber supplements (e.g. Metamucil, Citrucel, and Benefiber).  THE DAY BEFORE YOUR PROCEDURE         DATE:10/23/2009  DAY: MONDAY  1.  Drink clear liquids the entire day-NO SOLID FOOD  2.  Do not drink anything colored red or purple.  Avoid juices with pulp.  No orange juice.  3.  Drink at least 64 oz. (8 glasses) of fluid/clear liquids during the day to prevent dehydration and help the prep work efficiently.  CLEAR LIQUIDS INCLUDE: Water Jello Ice Popsicles Tea (sugar ok, no milk/cream) Powdered fruit flavored drinks Coffee (sugar ok, no milk/cream) Gatorade Juice: apple, white grape, white cranberry  Lemonade Clear bullion, consomm, broth Carbonated beverages (any kind) Strained chicken noodle soup Hard Candy                             4.  In the morning, mix first dose of MoviPrep solution:    Empty 1 Pouch A and 1 Pouch B into the disposable container    Add lukewarm drinking water to the top line of the container. Mix to dissolve    Refrigerate (mixed solution should be used within 24 hrs)  5.  Begin drinking the prep at 5:00 p.m. The MoviPrep container is divided by 4 marks.   Every 15 minutes drink the solution down to the next mark (approximately 8 oz) until the full liter is complete.   6.  Follow completed prep with 16 oz of clear liquid of your choice (Nothing red or purple).  Continue  to drink clear liquids until bedtime.  7.  Before going to bed, mix second dose of MoviPrep solution:    Empty 1 Pouch A and 1 Pouch B into the disposable container    Add lukewarm drinking water to the top line of the container. Mix to dissolve    Refrigerate  THE DAY OF YOUR PROCEDURE      DATE: 10/24/2009 DAY: TUESDAY  Beginning at 3:00a.m. (5 hours before procedure):         1. Every 15 minutes, drink the solution down to the next mark (approx 8 oz) until the full liter is complete.  2. Follow completed prep with 16 oz. of clear liquid of your choice.    3. You may drink clear liquids until 6:00AM (2 HOURS BEFORE PROCEDURE).   MEDICATION INSTRUCTIONS  Unless otherwise instructed, you should take regular prescription medications with a small sip of water   as early as possible the morning of your procedure.           OTHER INSTRUCTIONS  You will need a responsible adult at least 53 years of age to accompany you  and drive you home.   This person must remain in the waiting room during your procedure.  Wear loose fitting clothing that is easily removed.  Leave jewelry and other valuables at home.  However, you may wish to bring a book to read or  an iPod/MP3 player to listen to music as you wait for your procedure to start.  Remove all body piercing jewelry and leave at home.  Total time from sign-in until discharge is approximately 2-3 hours.  You should go home directly after your procedure and rest.  You can resume normal activities the  day after your procedure.  The day of your procedure you should not:   Drive   Make legal decisions   Operate machinery   Drink alcohol   Return to work  You will receive specific instructions about eating, activities and medications before you leave.    The above instructions have been reviewed and explained to me by   _______________________    I fully understand and can verbalize these instructions  _____________________________ Date _________

## 2010-11-01 NOTE — Assessment & Plan Note (Signed)
Summary: eye irratation//lch   Vital Signs:  Patient profile:   55 year old female Weight:      174 pounds Pulse rate:   82 / minute Pulse rhythm:   regular BP sitting:   120 / 80  (left arm) Cuff size:   regular  Vitals Entered By: Army Fossa CMA (January 22, 2010 2:14 PM) CC: Pts left eye very red, she states that when she wears contacts it flares up. Been going on for 1 week.   History of Present Illness: Pt c/o L eye red ---It will get better and then when she wears her contacts it flares up again---its been going on about a week.    Current Medications (verified): 1)  One Touch Ultra Mini Strips and Lancets .... Check Bs Once Daily 2)  Tylenol Extra Strength 500 Mg Tabs (Acetaminophen) .... Take 2-4 Tablets By Mouth As Needed 3)  Centrum  Tabs (Multiple Vitamins-Minerals) .... Take 1 Tablet By Mouth Once A Day 4)  Calcium (Unknown Dosage) .... Take 1 Tablet By Mouth Once A Day 5)  Potassium (Unknown Dosage) .... Take 1 Tablet By Mouth Once A Day 6)  Vitamin D (Unknown Dosage) .... Take 1 Tablet By Mouth Once A Day 7)  Nexium 40 Mg Cpdr (Esomeprazole Magnesium) .... Take One Tab Daily 8)  Vigamox 0.5 % Soln (Moxifloxacin Hcl) .Marland Kitchen.. 1 Gtt Three Times A Day For 7 Days  Allergies: 1)  ! Codeine 2)  ! Morphine 3)  ! Sulfa 4)  ! Asa  Past History:  Past medical, surgical, family and social histories (including risk factors) reviewed for relevance to current acute and chronic problems.  Past Medical History: Reviewed history from 04/05/2009 and no changes required. GERD Hypertension Arthritis Gastritis Hiatal Hernia  Past Surgical History: Reviewed history from 10/11/2009 and no changes required. EGD(03/05/2005) Breast surgery reduction 1996 Left Total hip replacement x 2 Left Hip replacement revision (1st hip failed due to cracked pelvis) Bariatric Surgery  Family History: Reviewed history from 10/11/2009 and no changes required. Father: LIVING Mother:  LIVING Siblings: 1 LIVING Family History of Melanoma-MGM Family History Breast cancer 1st degree relative <50-MGM, Aunt Family History of Arthritis-FATHER Family History Hypertension: MOTHER, FATHER, SISTER Family History Diabetes 1st degree relative: FATHER,SISTER No FH of Colon Cancer:  Social History: Reviewed history from 10/11/2009 and no changes required. 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  Alcohol Use - no Daily Caffeine Use- 1 cup daily Illicit Drug Use - no  Review of Systems      See HPI  Physical Exam  General:  Well-developed,well-nourished,in no acute distress; alert,appropriate and cooperative throughout examination Eyes:  + corneal abrasion R low medial aspect sclera no d/c ---seen after flourescene and black lamp used  Psych:  Oriented X3 and normally interactive.     Impression & Recommendations:  Problem # 1:  CORNEAL ABRASION, LEFT (ICD-918.1) vigamox for 7 days to optho if no better in 3-4 days  Complete Medication List: 1)  One Touch Ultra Mini Strips and Lancets  .... Check bs once daily 2)  Tylenol Extra Strength 500 Mg Tabs (Acetaminophen) .... Take 2-4 tablets by mouth as needed 3)  Centrum Tabs (Multiple vitamins-minerals) .... Take 1 tablet by mouth once a day 4)  Calcium (unknown Dosage)  .... Take 1 tablet by mouth once a day 5)  Potassium (unknown Dosage)  .... Take 1 tablet by mouth once a day 6)  Vitamin D (unknown Dosage)  .... Take 1 tablet  by mouth once a day 7)  Nexium 40 Mg Cpdr (Esomeprazole magnesium) .... Take one tab daily 8)  Vigamox 0.5 % Soln (Moxifloxacin hcl) .Marland Kitchen.. 1 gtt three times a day for 7 days Prescriptions: VIGAMOX 0.5 % SOLN (MOXIFLOXACIN HCL) 1 gtt three times a day for 7 days  #7 days x 0   Entered and Authorized by:   Loreen Freud DO   Signed by:   Loreen Freud DO on 01/22/2010   Method used:   Electronically to        [facility] (retail)       [address]       [place], [place]  [address]       Ph: [phone]       Fax: [phone]   RxID:   [id number]

## 2010-11-01 NOTE — Progress Notes (Signed)
  Phone Note Call from Patient   Summary of Call: Returned pts phone call and told her that Dr. Arlyce Dice stated that she did not need antibiotics prior to her colonoscopy.  Pt still concerned and asked if it was too late to cancel.  After speaking with Dr. Arlyce Dice I then called the patients orthopedic surgeons office and was told that she did if fact need an antibiotic and Levaquin had been called into her pharmacy.  Phoned  pt to let her know.  She will take this prior to her procedure per her surgeons order and keep her appt. for 10/24/09.   Initial call taken by: Jennye Boroughs RN,  October 23, 2009 2:58 PM

## 2010-11-01 NOTE — Progress Notes (Signed)
Summary: prep ?  Phone Note Call from Patient Call back at Work Phone [phone]   Caller: Patient Call For: Dr. Arlyce Dice Reason for Call: Talk to Nurse Summary of Call: med questions regarding prep routine Initial call taken by: Vallarie Mare,  October 23, 2009 10:33 AM  Follow-up for Phone Call        Returned pts phone cal.  She wanted to know if she needed an antibiotic before her colonoscopy.  She had a pelvis and hip replacement in May 2010 and states that her paperwork from that surgery states she needs an antibiotic before a colonoscopy. Will ask Dr. Arlyce Dice to advise. Follow-up by: Jennye Boroughs RN,  October 23, 2009 1:24 PM  Additional Follow-up for Phone Call Additional follow up Details #1::        No need for antibiotic prophylaxis for hip revision Additional Follow-up by: Louis Meckel MD,  October 23, 2009 2:25 PM

## 2010-11-01 NOTE — Procedures (Signed)
Summary: Colonoscopy  Patient: [name] Note: All result statuses are Final unless otherwise noted.  Tests: (1) Colonoscopy (COL)   COL Colonoscopy           DONE     [facility]     [address]     [place], [place]  [address]           COLONOSCOPY PROCEDURE REPORT           PATIENT:  [name], [name]  MR#:  [id number]     BIRTHDATE:  September 01, 1956, 53 yrs. old  GENDER:  female           ENDOSCOPIST:  Barbette Hair. Arlyce Dice, MD     Referred by:  Loreen Freud, DO           PROCEDURE DATE:  10/24/2009     PROCEDURE:  Colonoscopy, Diagnostic     ASA CLASS:  Class II     INDICATIONS:  Routine Risk Screening           MEDICATIONS:   Fentanyl 75 mcg IV, Versed 8 mg IV           DESCRIPTION OF PROCEDURE:   After the risks benefits and     alternatives of the procedure were thoroughly explained, informed     consent was obtained.  Digital rectal exam was performed and     revealed no abnormalities.   The LB [id number] endoscope     was introduced through the anus and advanced to the cecum, which     was identified by both the appendix and ileocecal valve, without     limitations.  The quality of the prep was good, using MoviPrep.     The instrument was then slowly withdrawn as the colon was fully     examined.     <<PROCEDUREIMAGES>>           FINDINGS:  A normal appearing cecum, ileocecal valve, and     appendiceal orifice were identified. The ascending, hepatic     flexure, transverse, splenic flexure, descending, sigmoid colon,     and rectum appeared unremarkable (see image1, image2, image3,     image7, and image8).   Retroflexed views in the rectum revealed no     abnormalities.    The scope was then withdrawn from the patient     and the procedure completed.           COMPLICATIONS:  None           ENDOSCOPIC IMPRESSION:     1) Normal colon     RECOMMENDATIONS:     1) Continue current colorectal screening recommendations for     "routine risk" patients  with a repeat colonoscopy in 10 years.           REPEAT EXAM:  In 10 year(s) for Colonoscopy.           ______________________________     Barbette Hair. Arlyce Dice, MD           CC:           n.     eSIGNED:   Barbette Hair. Jamileth Putzier at 10/24/2009 08:39 AM           Geanie Kenning, [id number]  Note: An exclamation mark (!) indicates a result that was not dispersed into the flowsheet. Document Creation Date: 10/24/2009 8:39 AM _______________________________________________________________________  (1) Order result status: Final Collection or observation date-time: 10/24/2009 08:31 Requested date-time:  Receipt  date-time:  Reported date-time:  Referring Physician:   Ordering Physician: Melvia Heaps [phone]) Specimen Source:  Source: Launa Grill Order Number: [id number] Lab site:   Appended Document: Colonoscopy    Clinical Lists Changes  Observations: Added new observation of COLONNXTDUE: 10/2019 (10/24/2009 12:51)

## 2010-11-14 ENCOUNTER — Ambulatory Visit (INDEPENDENT_AMBULATORY_CARE_PROVIDER_SITE_OTHER): Payer: Managed Care, Other (non HMO) | Admitting: Family Medicine

## 2010-11-14 ENCOUNTER — Encounter: Payer: Self-pay | Admitting: Family Medicine

## 2010-11-14 ENCOUNTER — Other Ambulatory Visit: Payer: Self-pay | Admitting: Family Medicine

## 2010-11-14 DIAGNOSIS — R252 Cramp and spasm: Secondary | ICD-10-CM

## 2010-11-14 HISTORY — DX: Cramp and spasm: R25.2

## 2010-11-14 LAB — BASIC METABOLIC PANEL
BUN: 13 mg/dL (ref 6–23)
Chloride: 106 mEq/L (ref 96–112)
GFR: 166.52 mL/min (ref >60.00)
Glucose, Bld: 78 mg/dL (ref 70–99)
Potassium: 4.6 mEq/L (ref 3.5–5.1)

## 2010-11-20 ENCOUNTER — Encounter: Payer: Self-pay | Admitting: Family Medicine

## 2010-11-21 NOTE — Assessment & Plan Note (Signed)
Summary: rto weight loss/cbs.   Vital Signs:  Patient profile:   55 year old female Weight:      182.0 pounds Pulse rate:   68 / minute Pulse rhythm:   regular BP sitting:   120 / 80  (right arm) Cuff size:   large  Vitals Entered By: Almeta Monas CMA Duncan Dull) (November 14, 2010 10:16 AM) CC: weight check--pt c/o cramoing in her toes and leg pain- wants potassium checked   History of Present Illness: Pt here c/o leg cramps at night and she is asking to check K.  Pt also wants to discuss weight loss.    Problems Prior to Update: 1)  Leg Cramps, Nocturnal  (ICD-729.82) 2)  Other Specified Disease of White Blood Cells  (ICD-288.8) 3)  Preventive Health Care  (ICD-V70.0) 4)  Constipation  (ICD-564.00) 5)  Corneal Abrasion, Left  (ICD-918.1) 6)  Chest Pain Unspecified  (ICD-786.50) 7)  Special Screening For Malignant Neoplasms Colon  (ICD-V76.51) 8)  Hypoglycemia, Unspecified  (ICD-251.2) 9)  Blood in Stool  (ICD-578.1) 10)  Abdominal Pain Other Specified Site  (ICD-789.09) 11)  Flank Pain, Right  (ICD-789.09) 12)  Ankylosing Spondylitis  (ICD-720.0) 13)  Muscle Pain  (ICD-729.1) 14)  Family History Diabetes 1st Degree Relative  (ICD-V18.0) 15)  Family History of Asthma  (ICD-V17.5) 16)  Family History Breast Cancer 1st Degree Relative <50  (ICD-V16.3) 17)  Family History of Melanoma  (ICD-V16.8) 18)  Bariatric Surgery Status  (ICD-V45.86) 19)  Ibs  (ICD-564.1) 20)  Morbid Obesity  (ICD-278.01) 21)  Thyroid Cyst  (ICD-246.2) 22)  Hypertension  (ICD-401.9) 23)  Gerd  (ICD-530.81) 24)  Allergic Rhinitis  (ICD-477.9) 25)  Thyroid Nodule, Hx of  (ICD-V12.2) 26)  Lymphadenopathy  (ICD-785.6)  Medications Prior to Update: 1)  One Touch Ultra Mini Strips and Lancets .... Check Bs Once Daily 2)  Tylenol Extra Strength 500 Mg Tabs (Acetaminophen) .... Take 2-4 Tablets By Mouth As Needed 3)  Centrum  Tabs (Multiple Vitamins-Minerals) .... Take 1 Tablet By Mouth Once A Day 4)   Calcium (Unknown Dosage) .... Take 1 Tablet By Mouth Once A Day 5)  Potassium (Unknown Dosage) .... Take 1 Tablet By Mouth Once A Day 6)  Vitamin D (Unknown Dosage) .... Take 1 Tablet By Mouth Once A Day 7)  Nexium 40 Mg Cpdr (Esomeprazole Magnesium) .... Take One Tab Daily 8)  Adipex-P 37.5 Mg Caps (Phentermine Hcl) .Marland Kitchen.. 1 By Mouth Once Daily  Current Medications (verified): 1)  One Touch Ultra Mini Strips and Lancets .... Check Bs Once Daily 2)  Tylenol Extra Strength 500 Mg Tabs (Acetaminophen) .... Take 2-4 Tablets By Mouth As Needed 3)  Centrum  Tabs (Multiple Vitamins-Minerals) .... Take 1 Tablet By Mouth Once A Day 4)  Calcium (Unknown Dosage) .... Take 1 Tablet By Mouth Once A Day 5)  Potassium (Unknown Dosage) .... Take 1 Tablet By Mouth Once A Day 6)  Vitamin D (Unknown Dosage) .... Take 1 Tablet By Mouth Once A Day 7)  Nexium 40 Mg Cpdr (Esomeprazole Magnesium) .... Take One Tab Daily 8)  Adipex-P 37.5 Mg Caps (Phentermine Hcl) .Marland Kitchen.. 1 By Mouth Once Daily  Allergies (verified): 1)  ! Codeine 2)  ! Morphine 3)  ! Sulfa 4)  ! Jonne Ply  Past History:  Past Medical History: Last updated: 04/05/2009 GERD Hypertension Arthritis Gastritis Hiatal Hernia  Past Surgical History: Last updated: 06/22/2010 EGD(03/05/2005) Breast surgery reduction 1996 Left Total hip replacement x 2 Left Hip replacement  revision (1st hip failed due to cracked pelvis) Bariatric Surgery EGD 04/2010 colon 01/2010  Family History: Last updated: 10/11/2009 Father: LIVING Mother: LIVING Siblings: 1 LIVING Family History of Melanoma-MGM Family History Breast cancer 1st degree relative <50-MGM, Aunt Family History of Arthritis-FATHER Family History Hypertension: MOTHER, FATHER, SISTER Family History Diabetes 1st degree relative: FATHER,SISTER No FH of Colon Cancer:  Social History: Last updated: 10/11/2009 Occupation: Quality Consultant Patient has never smoked.  Alcohol Use - no Daily Caffeine  Use- 1 cup daily Illicit Drug Use - no  Risk Factors: Alcohol Use: 0 (06/22/2010) Caffeine Use: 2 (06/22/2010) Exercise: no (06/22/2010)  Risk Factors: Smoking Status: never (06/22/2010)  Family History: Reviewed history from 10/11/2009 and no changes required. Father: LIVING Mother: LIVING Siblings: 1 LIVING Family History of Melanoma-MGM Family History Breast cancer 1st degree relative <50-MGM, Aunt Family History of Arthritis-FATHER Family History Hypertension: MOTHER, FATHER, SISTER Family History Diabetes 1st degree relative: FATHER,SISTER No FH of Colon Cancer:  Social History: Reviewed history from 10/11/2009 and no changes required. 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  Alcohol Use - no Daily Caffeine Use- 1 cup daily Illicit Drug Use - no  Review of Systems      See HPI  Physical Exam  General:  Well-developed,well-nourished,in no acute distress; alert,appropriate and cooperative throughout examination Lungs:  Normal respiratory effort, chest expands symmetrically. Lungs are clear to auscultation, no crackles or wheezes. Heart:  Normal rate and regular rhythm. S1 and S2 normal without gallop, murmur, click, rub or other extra sounds. Extremities:  No clubbing, cyanosis, edema, or deformity noted with normal full range of motion of all joints.   Cervical Nodes:  No lymphadenopathy noted Psych:  Cognition and judgment appear intact. Alert and cooperative with normal attention span and concentration. No apparent delusions, illusions, hallucinations   Impression & Recommendations:  Problem # 1:  LEG CRAMPS, NOCTURNAL (ICD-729.82)  Orders: Venipuncture (32355) Specimen Handling (73220) TLB-BMP (Basic Metabolic Panel-BMET) (80048-METABOL)  Problem # 2:  MORBID OBESITY (ICD-278.01)  pt is slowly gaining weight---adipex no help so pt stopped pt is doing weight watchers and is getting a [place] track--recom bike --so she will start  exercising  Ht: 62.5 (07/23/2010)   Wt: 182.0 (11/14/2010)   BMI: 32.26 (07/23/2010)  Complete Medication List: 1)  One Touch Ultra Mini Strips and Lancets  .... Check bs once daily 2)  Tylenol Extra Strength 500 Mg Tabs (Acetaminophen) .... Take 2-4 tablets by mouth as needed 3)  Centrum Tabs (Multiple vitamins-minerals) .... Take 1 tablet by mouth once a day 4)  Calcium (unknown Dosage)  .... Take 1 tablet by mouth once a day 5)  Potassium (unknown Dosage)  .... Take 1 tablet by mouth once a day 6)  Vitamin D (unknown Dosage)  .... Take 1 tablet by mouth once a day 7)  Nexium 40 Mg Cpdr (Esomeprazole magnesium) .... Take one tab daily 8)  Adipex-p 37.5 Mg Caps (Phentermine hcl) .Marland Kitchen.. 1 by mouth once daily   Orders Added: 1)  Venipuncture [25427] 2)  Specimen Handling [99000] 3)  TLB-BMP (Basic Metabolic Panel-BMET) [80048-METABOL] 4)  Est. Patient Level III [06237]

## 2010-12-06 NOTE — Letter (Signed)
Summary: [facility]  [facility]   Imported By: Lanelle Bal 11/27/2010 13:57:01  _____________________________________________________________________  External Attachment:    Type:   Image     Comment:   External Document

## 2010-12-28 ENCOUNTER — Ambulatory Visit (HOSPITAL_COMMUNITY)
Admission: RE | Admit: 2010-12-28 | Discharge: 2010-12-28 | Disposition: A | Discharge status: Home / Self Care | Payer: Managed Care, Other (non HMO) | Source: Ambulatory Visit | Attending: Orthopedic Surgery | Admitting: Orthopedic Surgery

## 2010-12-28 DIAGNOSIS — M79609 Pain in unspecified limb: Secondary | ICD-10-CM | POA: Insufficient documentation

## 2010-12-28 DIAGNOSIS — M7989 Other specified soft tissue disorders: Secondary | ICD-10-CM | POA: Insufficient documentation

## 2011-01-04 LAB — D-DIMER, QUANTITATIVE: D-Dimer, Quant: 0.53 ug/mL-FEU — ABNORMAL HIGH (ref 0.00–0.48)

## 2011-01-04 LAB — DIFFERENTIAL
Basophils Relative: 1 % (ref 0–1)
Eosinophils Absolute: 0.1 10*3/uL (ref 0.0–0.7)
Neutrophils Relative %: 40 % — ABNORMAL LOW (ref 43–77)

## 2011-01-04 LAB — CBC
MCHC: 33 g/dL (ref 30.0–36.0)
MCV: 97.6 fL (ref 78.0–100.0)
Platelets: 295 10*3/uL (ref 150–400)
RDW: 12.9 % (ref 11.5–15.5)

## 2011-01-04 LAB — BASIC METABOLIC PANEL
BUN: 8 mg/dL (ref 6–23)
CO2: 32 mEq/L (ref 19–32)
Chloride: 100 mEq/L (ref 96–112)
Creatinine, Ser: 0.6 mg/dL (ref 0.4–1.2)

## 2011-01-04 LAB — POCT CARDIAC MARKERS

## 2011-01-11 ENCOUNTER — Other Ambulatory Visit: Payer: Self-pay | Admitting: Orthopedic Surgery

## 2011-01-11 DIAGNOSIS — M545 Low back pain, unspecified: Secondary | ICD-10-CM

## 2011-01-26 ENCOUNTER — Other Ambulatory Visit: Payer: Managed Care, Other (non HMO)

## 2011-01-31 ENCOUNTER — Other Ambulatory Visit: Payer: Self-pay | Admitting: Family Medicine

## 2011-02-01 ENCOUNTER — Other Ambulatory Visit: Payer: Managed Care, Other (non HMO)

## 2011-02-05 ENCOUNTER — Other Ambulatory Visit: Payer: Managed Care, Other (non HMO)

## 2011-02-08 ENCOUNTER — Other Ambulatory Visit (HOSPITAL_COMMUNITY): Payer: Self-pay | Admitting: Obstetrics and Gynecology

## 2011-02-08 DIAGNOSIS — Z1231 Encounter for screening mammogram for malignant neoplasm of breast: Secondary | ICD-10-CM

## 2011-02-12 NOTE — Op Note (Signed)
NAMEJENNELLE, [name]               ACCOUNT NO.:  [id number]   MEDICAL RECORD NO.:  [id number]          PATIENT TYPE:  INP   LOCATION:  1603                         FACILITY:  [facility]   PHYSICIAN:  Madlyn Frankel. Charlann Boxer, M.D.  DATE OF BIRTH:  1956/02/01   DATE OF PROCEDURE:  09/26/2008  DATE OF DISCHARGE:                               OPERATIVE REPORT   PREOPERATIVE DIAGNOSIS:  Failed left hip, particularly with an  acetabular component.   POSTOPERATIVE DIAGNOSIS:  Failed left hip, particularly with an  acetabular component.   FINDINGS:  The patient was noted to have extensive acetabular loss, at  least a type 3 B acetabulum, with extensive loss of medial wall bone,  pelvic discontinuity with very thin remaining posterior wall, thin  remaining anterior wall, and fracture into the anterior column of the  ileum.  There was very little bone between the ischium and pubis as  well.   PROCEDURE:  Resection arthroplasty of the left total hip with placement  of antibiotic beads numbering 31 into the acetabular defect.  This  included removal of a fixed Monoblock 32-mm AML stem.   SURGEON:  Madlyn Frankel. Charlann Boxer, M.D.   ASSISTANT:  Dwyane Luo, PA-C.   ANESTHESIA:  Spinal due to a history of ankylosing spondylitis and  difficult intubation.  She had had spinal block anesthesia in the past  successfully.   BLOOD LOSS:  800 mL.  She did receive 200 mL backup CellSaver blood.   FLUIDS:  Per anesthetic record.   DRAINS:  2 drains were placed; one into the superficial area to drain  the area between the iliotibial band and the subcutaneous tissue, and  then one deep into the capsular area.   INDICATIONS FOR THE PROCEDURE:  Ms. Guilfoil is an unfortunate 55 year old  female with an Index left total hip replacement performed in 1986 with  an underlying diagnosis of ankylosing spondylitis.  She did have  reported revision surgery in 1990.  She had apparently been doing well  with her hip until the past  couple of years.  During that period of time  she was tolerating discomfort.  Unfortunately, she presented recently to  [facility] and was seen by Dr. Rosanne Ashing [place] and noted to  have an acetabular shell that had changed position and was obviously  loose.  She was referred to Dr. Ollen Gross and approximately 2 weeks  ago Dr. [name] performed revision acetabular surgery, maintaining the  32-mm fixed Monoblock AML stem.   She had been in her normal state of health doing physical therapy with  limited weightbearing until she presented to the emergency room on  September 24, 2008.  At that time it was initially felt she had a hip  dislocation, but on review of the radiographs, it was obvious that her  acetabular component had rotated out.   I had a very lengthy discussion with Ms. Villers and her family regarding  her situation.  A CT scan was ordered preoperatively in an attempt to  try to evaluate acetabular bone stock.  The plan was to proceed with a  revision arthroplasty with a plan to remove the femoral stem to allow  for adequate exposure of the acetabulum, possible need for posterior  column plating, as well as restructuring the acetabulum with a Zimmer-  type Tantalum cup.  I felt that I was going to probably need augments to  support what was going on.   A lengthy discussion was had.  The risks and benefits were discussed.  Postoperative course expectations were reviewed including the possible  failure of this type of surgery and the need for more of a salvage-type  component.  Consent was obtained for the procedure discussed above.   PROCEDURE IN DETAIL:  The patient was brought to the operative theater.  Once the spinal block anesthesia was established and preoperative  antibiotics administered, the patient was positioned into the right  lateral decubitus position with the left side up.  The patient's old  incision was used and extended distally to allow for  osteotomy of the  femur.  Sharp dissection was carried down.  There was a large amount of  hematoma that was evacuated but also obtained for culture.   The iliotibial band was opened over the top of the vastus lateralis.  Attention was first directed to the femoral portion.  I elevated the  vastus lateralis anteriorly from the posterior intermuscular septum.  I  had the femur exposed for approximately 16 cm from the tip of the  trochanter.  There were some retained trochanteric wires, which I  removed.     At first, over the anterior cortex, I drilled a few holes to propagate  the osteotomy fracture through.  I then used a thin oscillating saw on  the posterior side of the femur, and then using osteotomes, elevated the  extended trochanter osteotomy fragment.  I then used thin osteotomes to  remove the remaining portion of the stem.   With the stem removed, I was now able to have adequate exposure of the  acetabular shell, which was noted to be obviously loose even with direct  exposure.  I used a 6.5-mm cancellous screw to remove the old liner.  There were 3 screws placed.  Two of them were loose and did not have any  bony contact and the third into the ilium had some contact.  I removed  this with a screwdriver to prevent any problems.  The cup was then  lifted out.   There was an extensive amount of cancellous bone graft that was in this  area.  I removed this carefully due to the fact that there was no medial  wall at all; there were just soft tissues.  I used a combination of a  curette and irrigation to debride and expose this area very carefully.   It was immediately evident upon this portion of the procedure, at the  time of removal of the cup, that there was pelvic dissociation.  There  was an obvious fracture posteriorly at the junction of the posterior  wall and ilium as well as anterior superior.  The fractures were  basically on this left hip at approximately 10 o'clock  and 2 o'clock.   There was zero medial wall.  There was very little bone stock  posteriorly with a very thin posterior wall.   At this point I made a decision that it was in the best interest of the  patient to abort the operation at this point.  I did decide to place  some antibiotic beads due to  fact she just had surgery within a 2-week  period of time to try to help with the potential prevention of  infection.   At this point it was my clinical judgment that the patient was going to  probably require more of a salvage-type operation with perhaps a custom-  made Triflange-type component.  She would need to have a CT scan of her  pelvis to evaluate remaining bone stock and a potential availability of  this type of procedure.  I did at this point use three 16-gauge wires  and reapproximated the femoral osteotomy.   Prior to this though, I did ream distally, opening up the canal to a 12-  mm reaming for the potential placement in the future of a fully-porous  coated-type prosthesis.   Following irrigation of the wound with pulse lavage normal saline  solution, I placed a medium Hemovac drain deep.   I did construct using 1 batch of cement with vancomycin, a strand of  vancomycin-laden cement beads on #1 PDS suture totaling 31.  This was  then placed into the acetabular shell once they had hardened.   At this point I reapproximated the iliotibial band and the gluteal  fascia using #1 PDS suture as a monofilament.  I then reapproximated the  subcu layer using 2-0 Vicryl and used staples to close the skin.  At  this point the skin was cleaned, dried and dressed sterilely with a  sterile dressing.  She was transferred to the recovery room with 2  drains; again, one into the deep capsule layer and one superficial to  the iliotibial band.   She tolerated the procedure well under the spinal block anesthesia.  I  reviewed extensively the condition of her procedure with her family.  At   the time of this dictation, I had a chance to review it with the  patient.  In addition, I was able to review the case with Dr. Kennon Holter, an orthopedic surgeon in [place], who has agreed to evaluate  the patient once we have obtained the CT scan per the Triflange  protocols to evaluate bone stock and quality.      Madlyn Frankel Charlann Boxer, M.D.  Electronically Signed     MDO/MEDQ  D:  09/27/2008  T:  09/28/2008  Job:  [id number]

## 2011-02-12 NOTE — H&P (Signed)
Courtney, [name]               ACCOUNT NO.:  [id number]   MEDICAL RECORD NO.:  [id number]          PATIENT TYPE:  INP   LOCATION:  1603                         FACILITY:  [facility]   PHYSICIAN:  Madlyn Frankel. Charlann Boxer, M.D.  DATE OF BIRTH:  09/10/1956   DATE OF ADMISSION:  09/24/2008  DATE OF DISCHARGE:                              HISTORY & PHYSICAL   ADMISSION DIAGNOSIS:  Left hip dislocation with failed acetabular  component.   SECONDARY DIAGNOSIS:  Includes history of ankylosing spondylitis,  history of nodular thyroid disease, history of thrombocytopenia.   ADMITTING HISTORY:  [name] is a very pleasant 55 year old female with  history of arthritic problems related to an underlying diagnosis of  ankylosing spondylitis.  In 1986 she had an index left total knee  replacement.  Several years later she required acetabular revision.  She  had done well until recently when she had progressive pain that led to  radiographic evaluation and diagnosed a loosened failed left acetabular  component.  On September 13, 2008, she was admitted to the hospital for a  revision left hip surgery.  She underwent surgery on that date.  At that  time she underwent an acetabular revision by Dr. Ollen Gross.  She was  discharged from the hospital after an approximately 4 day hospital stay.  She came to the emergency room today with increasing pain, no new trauma  reported.  Radiographs revealed an acetabular component that has now  shifted from its point on reviewing films from the past.   PAST MEDICAL HISTORY:  1. Includes ankylosing spondylitis.  2. Thrombocytopenia.  3. Nodular thyroid disease.   PAST SURGICAL HISTORY:  1. Includes gastric bypass surgery done in [place] recently.  2. History of left total knee replacement index procedure in 1986.  3. Revision left total hip replacement in 1990.  4. Revision left total hip replacement September 13, 2008.   FAMILY MEDICAL HISTORY.:   Noncontributory.   SOCIAL HISTORY:  She is married.  She is a Merchandiser, retail at [employer].  She  denies smoking or alcohol.   DRUG ALLERGIES:  MORPHINE causes itching and hives.   CURRENT MEDICATIONS:  Include multivitamins, vitamin D and calcium.  In  addition, pain medicine with her most recent surgery.  She was on  Coumadin.   REVIEW OF SYSTEMS:  Is otherwise unremarkable.  No reported fevers,  chills, but notes she had been on ciprofloxacin for a diagnosis of  urinary tract infection postoperatively.   PHYSICAL EXAMINATION:  GENERAL:  Examination finds her to be awake and  alert.  She is in hospital bed supine.  She has pain with any motion of  this left hip and is in some discomfort related despite pain medicine.  HEAD AND NECK:  Within normal limits.  No signs of any asymmetry.  No  slurred speech.  No ocular dysfunction.  CHEST:  Chest is clear to auscultation bilaterally.  ABDOMEN:  Nondistended with positive bowel sounds.  LOWER EXTREMITIES:  Examination reveals slightly externally rotated left  lower extremity.  She has pain with motion but is neurovascularly  stable.  She has a significantly swollen left lower extremity and some  on the right as well.   In the emergency room Dr. Donnetta Hutching had ordered an ultrasound of the  lower extremities to rule out DVT.  This was negative.  Lab work in the  emergency room had revealed a white blood cell count of 7.0, hematocrit  of 28.5, platelet count of 602.  Her electrolytes revealed a sodium of  139, potassium 3.5, BUN 7, creatinine 0.52.  She had a glucose of 90,  INR was 2.0.   RADIOGRAPHS:  Plain films in the ER revealed that she had not dislocated  her hip but had unfortunately dissociated her acetabular component from  the pelvis.  At this point questionable whether or not there is any  acetabular or pelvic discontinuity.   ASSESSMENT:  Failed left acetabular component.   PLAN:  I plan at this point to admit the patient to the  hospital.  Order  CT scan to evaluate for any acetabular or pelvic discontinuity and set  her up for surgery this week.  Surgical instrumentation and components  will need to be tracked down for this.   I discussed with her the possible need for removal of the previously  placed femoral component as it is a monoblock femoral head that would  make it difficult to have adequate exposure of the acetabulum depending  on the amount of work that needs to be done this may need to be removed  to facilitate this.   Further risks and benefits of the procedure will be discussed at length.  Consent will be obtained.  Otherwise we will order lab workup at this  point and try to get her as stable as possible for an operation on  Monday or Tuesday.      Madlyn Frankel Charlann Boxer, M.D.  Electronically Signed     MDO/MEDQ  D:  09/24/2008  T:  09/25/2008  Job:  [id number]

## 2011-02-12 NOTE — Op Note (Signed)
[name], [name]               ACCOUNT NO.:  [id number]   MEDICAL RECORD NO.:  [id number]          PATIENT TYPE:  INP   LOCATION:  1613                         FACILITY:  [facility]   PHYSICIAN:  [name], M.D.    DATE OF BIRTH:  03-22-1956   DATE OF PROCEDURE:  09/12/2008  DATE OF DISCHARGE:                               OPERATIVE REPORT   PREOPERATIVE DIAGNOSIS:  Failed left total hip arthroplasty.   POSTOPERATIVE DIAGNOSIS:  Failed left total hip arthroplasty.   PROCEDURE:  Left acetabular revision with bone grafting.   SURGEON:  [name], M.D.   ASSISTANT:  [name], P.A.C.   ANESTHESIA:  General.   ESTIMATED BLOOD LOSS:  600.   DRAINS:  Hemovac times one.   REPLACEMENT:  250 of Cell Saver.   COMPLICATIONS:  None.   CONDITION:  Stable to recovery room.   BRIEF CLINICAL NOTE:  [name] is a 55 year old female with a long  complex history in regards to her left hip.  She had an Index total hip  arthroplasty and he is followed by an acetabular revision.  The proximal  and 89.  She had progressively worsening groin pain and difficulty  ambulating in the past couple years.  X-rays taken upon on May 1 see her  few months ago and she is noted to have complete loosening of the  acetabular component with migration superiorly and vertically.  She  presents now for acetabular burst total hip revision.   PROCEDURE IN DETAIL:  After successful administration of spinal  anesthetic, the patient was placed in the right lateral decubitus  position with the left side up and held with the hip positioner.  The  left lower extremity was isolated from her perineum with plastic drapes  and prepped and draped in the usual sterile fashion.  A posterolateral  incision was made with a 10 blade through subcutaneous tissue to the  level of the fascia lata which was incised in line with the skin  incision.  The sciatic nerve was palpated and protected and the  posterior  pseudocapsule excised off the femur.  The hip was identified  and dislocated.  She has a mono-block femoral component which was does  not have a modular femoral head.  She had normal femoral stem version  and the stem was well-fixed.  I then cleared some of the tissue  anteriorly and retracted the femur anteriorly to gain acetabular  exposure.   The cup had migrated vertically and superiorly.  I cleaned the  osteolytic debris from around the cup and then was able to easily remove  the acetabular component.  There was no ingrowth on it.  The defect was  mainly central.  The anterior and posterior columns were still intact.  Superiorly there was defect also.  Once we removed the cup and cleared  any membrane from over the bone I did not disrupt the membrane that was  overlying the pelvic soft tissues.   We began reaming at 49 mm, coursing increments of 2-59 mm.  We had a  good rim fit with  the 59.  I then took 60 mL of cancellus allograft and  mixed it with the Symphony graft and then grafted it superiorly and  medially.  I then took a 57 reamer and reamed it reverse and we had  excellent bony coverage throughout the acetabulum at that point.  I took  a 60 mm Pinnacle acetabular multihole shell with the Gription coating.  I impacted it in approximately 40 degrees of abduction and 20 degrees of  anteversion.  I had a very good rim fit with this and transfixed it with  an additional three dome screws.  I then placed the impactor handle back  into the cup and tried to move the cup and the pelvis was moving as a  single unit with the acetabular shell confirming that it was well fixed  with no signs of motion between the shell and the pelvis.  We then  placed the trial 32 mm neutral +4 liner.  When it was reduced the  stability was full extension, full external rotation, approximately 70  degrees flexion, 40 degrees adduction, 60 degrees internal rotation and  90 degrees of flexion and 30  degrees of internal rotation.  We removed  the trial and then placed the permanent 32 mm neutral +4, 10 degrees  line with the 10 degrees elevated rim at the 1 o'clock position.  The  hip was again reduced with similar stability, perhaps a little more  stability in internal rotation and 90 degrees of flexion.  Since this  was a mono-block femoral component I was unable to adjust the length  through the head.  I was pleased with the stability as it was more  stable than it was preop.  I then thoroughly irrigated the wound and did  a soft tissue closure of the posterior pseudocapsule back to the femur  to help enhance stability.  The fascia lata was then closed over a  Hemovac drain with interrupted #1 Vicryl.  The subcu closed with #1 and  #2-0 Vicryl and skin with staples.  The drain was hooked to suction.  The incision was cleaned and dried and a bulky sterile dressing applied.  She was then placed into a knee immobilizer, awakened and transported to  recovery in stable condition.      [name], M.D.  Electronically Signed     FA/MEDQ  D:  09/12/2008  T:  09/13/2008  Job:  [id number]

## 2011-02-12 NOTE — H&P (Signed)
[name], [name]               ACCOUNT NO.:  [id number]   MEDICAL RECORD NO.:  [id number]          PATIENT TYPE:  INP   LOCATION:  NA                           FACILITY:  [facility]   PHYSICIAN:  Ollen Gross, M.D.    DATE OF BIRTH:  May 03, 1956   DATE OF ADMISSION:  09/12/2008  DATE OF DISCHARGE:                              HISTORY & PHYSICAL   CHIEF COMPLAINT:  Left hip pain.   HISTORY OF PRESENT ILLNESS:  The patient is a 55 year old female who has  been seen by Dr. [name], after being referred over by Dr. [name] for  evaluation of painful loose left total hip.  She had a primary hip done  back in 1986 due to degenerative changes from ankylosing spondylitis.  She initially did well, had a fall damaging the prosthesis and had an  acetabular revision about 4 years after.  She recovered from that and  has done well but over the past year she has developed some progressive  pain and worsening limp over the past several months.  She has been seen  in the office by Dr. [name] and found to have what appeared be a  loose acetabular component.  She was referred over to Dr. [name] having  significant amount of acetabular component migration and wear.  It was  felt that she definitely needed acetabular revision plus or minus the  femoral revision.  Risks and benefits of this surgery have been  discussed with the patient.  She elects to proceed with surgery.   ALLERGIES:  MORPHINE causes itching and hives.   INTOLERANCES:  CODEINE which she does not recall the specific reaction.  ASPIRIN, hurts her stomach.  PERCOCET makes her sick.   CURRENT MEDICATIONS:  Centrum multivitamin, Caltrate, vitamin D, iron, B  complex.   PAST MEDICAL HISTORY:  Nodular thyroid disease, history of ankylosing  spondylitis.  History of thrombocytopenia.   PAST SURGICAL HISTORY:  History of gastric bypass.  Hip replacement in  1986.  Hip revision in 1990.  Breast reduction 1986.   SOCIAL HISTORY:   Married, Merchandiser, retail at [employer].  Nonsmoker.  No alcohol.  Spouse will be assisting with care after surgery.  She has a 2-level  home with 4 steps entering.   REVIEW OF SYSTEMS:  GENERAL:  No fevers, chills.  Occasional night  sweats.  NEURO:  No seizures syncope or paralysis.  RESPIRATORY:  No  shortness breath, productive cough or hemoptysis.  CARDIOVASCULAR:  Chest pain, angina, orthopnea.  GI: No nausea, diarrhea.  She does have  some constipation.  GU: No dysuria or discharge.  MUSCULOSKELETAL:  Left  hip.   PHYSICAL EXAM:  VITAL SIGNS:  Pulse 68, respiration 12, blood pressure  118/60.  GENERAL:  55 year old [place] female well nourished, well  developed, no acute distress.  She is alert and cooperative, pleasant.  Good historian.  HEENT:  Normocephalic, atraumatic.  Pupils are reactive.  EOMs intact.  She does wear glasses and contacts.  NECK:  Supple.  CHEST:  Clear.  HEART:  Regular rate and rhythm.  No murmur, S1 S2 noted.  ABDOMEN:  Soft.  Slightly round.  Bowel sounds present.  RECTAL, BREASTS, GENITALIA:  Not done not pertinent present illness.  EXTREMITIES:  Left hip shows flexion of 90, internal rotation 20,  external rotation 20, abduction 20.  She does ambulate with an antalgic  gait.   IMPRESSION:  Failed left total hip arthroplasty.   PLAN:  Left total hip revision with acetabular grafting.   PLAN:  The patient admitted to [facility] to undergo above  surgery.  Surgery will be performed by Dr. Ollen Gross.      Alexzandrew L. Perkins, P.A.C.      Ollen Gross, M.D.  Electronically Signed    ALP/MEDQ  D:  09/11/2008  T:  09/11/2008  Job:  [id number]   cc:   Lelon Perla, DO  [address]  [place], [place] [address]   Reather Littler, M.D.  Fax: [phone]

## 2011-02-12 NOTE — Discharge Summary (Signed)
NAMESHALEN, PETRAK               ACCOUNT NO.:  [id number]   MEDICAL RECORD NO.:  [id number]          PATIENT TYPE:  INP   LOCATION:  1603                         [facility]ACILITY:  [facility]   PHYSICIAN:  Madlyn Frankel. Charlann Boxer, M.D.  DATE OF BIRTH:  [birth year]   DATE OF ADMISSION:  09/24/2008  DATE OF DISCHARGE:  10/03/2008                               DISCHARGE SUMMARY   ADMITTING DIAGNOSES:  1. Failed left total hip replacement.  2. Ankylosing spondylitis.  3. Thrombocytopenia.  4. Nodular thyroid disease.   DISCHARGE DIAGNOSES:  1. Failed left total hip replacement with resection and pelvic      fracture.  2. Ankylosing spondylitis.  3. Thrombocytopenia.  4. Nodular thyroid disease.   HISTORY OF PRESENT ILLNESS:  A 55 year old female with a history of  arthritic problems including ankylosing spondylitis, as well as multiple  hip replacement surgeries.  She recently had a revision left hip  replacement on September 13, 2008.  She was subsequently readmitted due  to increased pain and x-ray showed a migration failure of the left total  hip.  She was admitted for further revision surgery by Dr. Durene Romans.   CONSULTANTS:  [facility] inpatient rehab for request for placement which was  denied.   PROCEDURE:  Her procedure was a resection of the left total hip  replacement with placement of antibiotic spacer beads, as well as  cerclage closure of her distal femur.  Findings showed pelvic  discontinuity with multiple fractures, as well as old bone graft.  Gram  stain was sent during the procedure which was negative for infection.   LABORATORY DATA:  Gram stain negative.  Final gram stain from the wound  negative.  CBC final reading done on the September 28, 2008 showed white  blood cells 16, hemoglobin 9.9, hematocrit 29.8, and her platelets were  323.  Metabolic panel revealed sodium of 132, potassium 3.8, glucose  116, BUN 3, creatinine 0.5.  Her UA was negative for nitrites.  She did  have an  indwelling Foley catheter.  Calcium 7.4.   Radiology - CT pelvis showed removal of left hip arthroplasty with bone  graft.  Grossly stable comminuted pathologic fracture of the left  acetabulum.  New fracture of the proximal left femur with near anatomic  reduction with post cerclage wiring.  Extensive erosive  changes of the  left acetabulum and proximal left femur suggest aggressive particle  disease.  Infection is not excluded.  Left hip complete was done on  September 24, 2008 which showed loosening of the femoral component of the  left hip prosthesis.   HOSPITAL COURSE:  The patient was admitted through the emergency  department and admitted to the orthopedic service.  CT scan was  performed which did show loosening and migration of the acetabular  component with multiple pelvic fractures.  Postoperatively, she did  okay.  She was afebrile.  She did have some itching.  Hemodynamically,  she remained stable.  She utilized the PCA for pain control.  Dr. Charlann Boxer  discussed with the patient the need for CT scan for further evaluation  of her pelvis to determine the proper course of action.  Per discussion  with the patient, they discussed her transfer of care to a physician in  another practice with experience with CT scan, as well as triphalangeal  hip replacement surgery.  She was agreeable to this, as well as for  rehab placement postoperatively.  She was followed through the weekend  for pain control, as well as increased strengthening due for physical  therapy to allow her more independence in her transfers.  She was  nonweightbearing just touchdown with her toe for balance purposes of  this left lower extremity.  She did well through the weekend.  She did  continue to have an indwelling catheter.  We gave her some antibiotics  while this was indwelling.  Seen on October 03, 2008, she was doing okay.  She did have some increased swelling in her left lower extremity.  We  answered  questions about the rehab planning goals which would include  the goals of just increasing her ability to transfer from bed to  sitting, as well as to perform her activities of daily living.  She was  agreeable to this and was stable and ready for discharge on the October 03, 2008.   DISCHARGE DISPOSITION:  Discharged to skilled nurse facility rehab in  stable and improved condition.   DISCHARGE DIET:  Heart healthy.   DISCHARGE WOUND CARE:  Keep wound dry.   DISCHARGE PHYSICAL THERAPY:  She is nonweightbearing on this left lower  extremity.  She can use her toe for touchdown balance purposes but  otherwise is nonweightbearing.  PT goals are going to be to help her  increase her upper body strength, increased her ability to transfer from  bed to sitting, promote independence in as many activities as possible  including activities of daily living.   Discharge skin care and decubitus precautions due to immobility.   DISCHARGE MEDICATIONS:  1. Lovenox 40 mg subcutaneous q.24h. x10 days.  2. Then start enteric-coated aspirin 325 mg one p.o. daily x4 weeks.  3. Robaxin 500 mg one p.o. q.6h. p.r.n. muscle spasm pain.  4. Iron 325 mg one p.o. t.i.d. x3 weeks.  5. Colace 100 mg one p.o. b.i.d. p.r.n. constipation while on      narcotics.  6. MiraLax 17 grams one p.o. daily p.r.n. constipation while on      narcotics.  7. Oxycodone 5 mg one to three p.o. q.3-4h. p.r.n. pain.  8. Tylenol 650 mg one p.o. q.6h. p.r.n. pain in conjunction with      oxycodone.  9. Vitamin D3, 1000 units daily.  10.Potassium chloride 40 mEq daily p.r.n., dependent upon metabolic      values.  11.Calcium 500 mg t.i.d.  12.Protonix 40 mg p.o. b.i.d. p.r.n. for stomach pain.   DISCHARGE FOLLOWUP:  Follow with Dr. Charlann Boxer at phone number [phone] in 2  weeks for a wound check.    ______________________________  Yetta Glassman. [address], [place]      Madlyn Frankel. Charlann Boxer, M.D.  Electronically Signed   BLM/MEDQ  D:   10/03/2008  T:  10/03/2008  Job:  [id number]

## 2011-02-15 NOTE — Consult Note (Signed)
[facility]                          ENDOCRINOLOGY CONSULTATION   [name], [name]                     MRN:          [id number]  DATE:10/15/2006                            DOB:          1956-02-03    REFERRING PHYSICIAN:  Loreen Freud, M.D.   REASON FOR REFERRAL:  Check thyroid.   HISTORY OF PRESENT ILLNESS:  A 55 year old woman who recently had a CT  scan for lymphadenopathy. It also noted multiple thyroid cysts. She  states she is having hot flashes and she is seeing Dr. Rana Snare for  probable menopause.   PAST MEDICAL HISTORY:  Otherwise, healthy.   MEDICATIONS:  She takes no prescription medications.   SOCIAL HISTORY:  She is single. She works Product manager.   FAMILY HISTORY:  Negative for thyroid disease.   REVIEW OF SYSTEMS:  She has lost 100 pounds in the past 18 months, after  gastric bypass surgery. She denies difficulty swallowing or breathing.   PHYSICAL EXAMINATION:  VITAL SIGNS:  Blood pressure 132/70, heart rate  69, temperature 97.4. Weight 147.  GENERAL:  No distress.  SKIN:  Normal texture and temperature. No rash.  HEENT:  No proptosis. No peri-orbital swelling.  NECK:  Slightly enlarged with an irregular surface. I do not appreciate  a discrete nodule.  CHEST:  Clear to auscultation. No respiratory distress.  CARDIOVASCULAR:  No JVD. No edema. Regular rate and rhythm. No murmur.  NEUROLOGIC:  Alert and oriented. Does not appear anxious or depressed.  There is no tremor.   LABORATORY DATA:  CT scan of the neck shows multiple small thyroid  cysts. Also shows a few reactive lymph nodes.   TSH on June 05, 2006 is 1.53.   IMPRESSION:  1. Multiple thyroid cysts, which are usually hereditary.  2. Lymphadenopathy, which is probably reactive.  3. Hot flash type symptoms,which are most commonly menopausal.   PLAN:  1. Check thyroid ultrasound.  2. If it shows only the small thyroid cysts as expected, she  should      plan to return here in a year.  3. We discussed the natural history of a multi-nodular goiter and I      told her that it is possible that she would develop hyperthyroidism      over a period of years.     Sean A. Everardo All, MD  Electronically Signed    SAE/MedQ  DD: 10/17/2006  DT: 10/17/2006  Job #: [id number]   cc:   Loreen Freud, M.D.  Dineen Kid Rana Snare, M.D.

## 2011-02-15 NOTE — Discharge Summary (Signed)
NAMELEEANN, BADY               ACCOUNT NO.:  [id number]   MEDICAL RECORD NO.:  [id number]          PATIENT TYPE:  INP   LOCATION:  1613                         [facility]ACILITY:  [facility]   PHYSICIAN:  Ollen Gross, M.D.    DATE OF BIRTH:  1956-06-02   DATE OF ADMISSION:  09/12/2008  DATE OF DISCHARGE:  09/16/2008                               DISCHARGE SUMMARY   ADMITTING DIAGNOSES:  1. Failed left total hip.  2. Nodular thyroid disease.  3. Ankylosing spondylitis.  4. Thrombocytopenia.   DISCHARGE DIAGNOSES:  1. Failed left total hip arthroplasty status post left acetabular      revision with bone grafting.  2. Postop blood loss anemia, did not require transfusion.  3. Failed left total hip.  4. Nodular thyroid disease.  5. Ankylosing spondylitis.  6. Thrombocytopenia.  7. Urinary tract infection.   PROCEDURE:  On 09/12/2008, left acetabular revision with bone grafting.  Surgeon Dr. [name].  Assistant Avel Peace PA-C.  Anesthesia general.  Consults none.   BRIEF HISTORY:  Ms. Machamer is a 55 year old female with long complex  history of left hip.  She had a total hip done that was followed by an  acetabular revision.  Unfortunately, over the past couple years her pain  has gotten worse.  Over the past few months, she is noted to have  complete loosening of acetabular component with migration superior and  vertically, now presents for revision.   LABORATORY DATA:  Preop CBC:  Hemoglobin 13.4, hematocrit of 41.3, white  cell count 3.9, platelets 271.  Postop hemoglobin 10.3 and then to 9.1.  Last H&H 8.3 and 24.8.  PT/PTT preop 13.2 and 30 respectively.  INR 1.0.  Serial protimes followed per Coumadin protocol.  Last PT/INR 24.4 and  2.1.  Chem panel on admission all within normal limits.  Serial B mets  were followed.  Electrolytes remained within normal limits.  Preop UA:  Small leukocyte esterase, rare epithelials, 3-6 white cell, 0-2 red  cells.  Follow-up UA large  leukocyte esterase, few epithelials 21-50  white cells, few bacteria.   STUDIES:  EKG September 05, 2008, normal sinus rhythm.  No significant  change since last tracing confirmed by Dr. Deborah Chalk.  Two-view chest x-  ray September 05, 2008, stable chest x-ray, no active lung disease.   HOSPITAL COURSE:  The patient admitted to [facility],  tolerated procedure well, later transferred to the recovery room on  orthopedic floor.  Started on PCA and p.o. analgesic pain control  following surgery.  The patient had some soreness and pain over the  night.  She was placed on touchdown weightbearing.  Urine output looked  good.  Discontinued the knee immobilizer.  Had a history of  thrombocytopenia so we followed the platelets closely.  She was about  213 at postop which was good.  By day #2, pain was under a little bit  better control, doing pretty well.  Hemoglobin drifted down to 9.1,  placed her on iron supplements.  Continued with therapy.  She was  getting up and walking about 35 feet on  day #2.  The incision looked  good.  By day #3, still having a fair amount of difficulty getting  around.  We rechecked the urine.  Showed a UTI, so we started Cipro,  platelets were down a little bit, down to 168, so we rechecked.  Continued on the iron.  Hemoglobin was at 9.  She was asymptomatic with  this though.  Slowly progressing with physical therapy, although by the  end of day #3, she was up walking about 190 feet, progressing well and  by day #4, tolerating medications.  Platelets were back up to 195.  She  was therapeutic INR, tolerating therapy, was discharged home.   DISCHARGE/PLAN:  1. Discharged home on 09/16/2008.  2. Discharge diagnoses, please see above.  3. Discharge medications:  Dilaudid, Robaxin, Coumadin and Cipro.  4. Diet:  As tolerated.  5. Activity:  She is touchdown weightbearing, do not advance      weightbearing status.  Hip precautions.  Total hip protocol.  Gait       training, ambulation, ADLs, home health PT, home health nursing.      Follow-up 2 weeks.   DISPOSITION:  Home.   CONDITION ON DISCHARGE:  Improved.      Alexzandrew L. Perkins, P.A.C.      Ollen Gross, M.D.  Electronically Signed    ALP/MEDQ  D:  10/27/2008  T:  10/27/2008  Job:  [id number]   cc:   Ollen Gross, M.D.  Fax: [phone]   Sharlot Gowda, M.D.  Fax: [phone]   Reather Littler, M.D.  Fax: [phone]

## 2011-02-19 ENCOUNTER — Ambulatory Visit (HOSPITAL_COMMUNITY)
Admission: RE | Admit: 2011-02-19 | Discharge: 2011-02-19 | Disposition: A | Discharge status: Home / Self Care | Payer: Managed Care, Other (non HMO) | Source: Ambulatory Visit | Attending: Obstetrics and Gynecology | Admitting: Obstetrics and Gynecology

## 2011-02-19 DIAGNOSIS — Z1231 Encounter for screening mammogram for malignant neoplasm of breast: Secondary | ICD-10-CM | POA: Insufficient documentation

## 2011-05-08 ENCOUNTER — Other Ambulatory Visit: Payer: Self-pay | Admitting: Gastroenterology

## 2011-05-10 ENCOUNTER — Encounter: Payer: Managed Care, Other (non HMO) | Admitting: Family Medicine

## 2011-05-10 ENCOUNTER — Encounter: Payer: Self-pay | Admitting: Family Medicine

## 2011-05-11 NOTE — Progress Notes (Signed)
This encounter was created in error - please disregard.

## 2011-05-17 ENCOUNTER — Encounter: Payer: Self-pay | Admitting: Family Medicine

## 2011-05-17 ENCOUNTER — Ambulatory Visit (INDEPENDENT_AMBULATORY_CARE_PROVIDER_SITE_OTHER): Payer: Managed Care, Other (non HMO) | Admitting: Family Medicine

## 2011-05-17 VITALS — BP 120/78 | HR 69 | Temp 98.7°F | Wt 187.0 lb

## 2011-05-17 DIAGNOSIS — R079 Chest pain, unspecified: Secondary | ICD-10-CM

## 2011-05-17 DIAGNOSIS — K219 Gastro-esophageal reflux disease without esophagitis: Secondary | ICD-10-CM

## 2011-05-17 NOTE — Assessment & Plan Note (Signed)
D/w pt not running out of med---discomfort relieved with GI cocktail.  Pt will p/u rx from pharmacy Refer to GI EKG NSR

## 2011-05-17 NOTE — Progress Notes (Signed)
  Subjective:    Patient ID: [name], female    DOB: 1956-06-06, 55 y.o.   MRN: [id number]  HPI Pt here for bp check ---she thought it was running high at home but states it must be her cuff.  Pt also c/o increased cp and gerd symptoms.  She ran out of her nexium but has had a lot of chest discomfort.      Review of Systems  Constitutional: Negative for fever, chills, diaphoresis, activity change, appetite change, fatigue and unexpected weight change.  Cardiovascular: Positive for chest pain and leg swelling. Negative for palpitations.  Gastrointestinal: Positive for abdominal pain. Negative for nausea, vomiting, diarrhea, constipation and blood in stool.  Genitourinary: Negative for dysuria, frequency, hematuria and flank pain.       Objective:   Physical Exam  Constitutional: She is oriented to person, place, and time. She appears well-nourished.  Neck: Normal range of motion. Neck supple.  Cardiovascular: Normal rate, regular rhythm and normal heart sounds.   No murmur heard. Pulmonary/Chest: Effort normal and breath sounds normal. No respiratory distress. She has no wheezes. She has no rales. She exhibits no tenderness.  Abdominal: Soft. There is tenderness. There is no rebound and no guarding.       midepigastric tenderness  Neurological: She is alert and oriented to person, place, and time.  Psychiatric: She has a normal mood and affect. Her behavior is normal. Thought content normal.          Assessment & Plan:

## 2011-05-17 NOTE — Patient Instructions (Signed)
Esophagitis (Heartburn) Esophagitis (heartburn) is a painful, burning sensation in the chest. It may feel worse in certain positions, such as lying down or bending over. It is caused by stomach acid backing up into the tube that carries food from the mouth down to the stomach (lower esophagus). TREATMENT There are a number of non-prescription medicines used to treat heartburn, including:  Antacids.   Acid reducers (also called H-2 blockers).   Proton-pump inhibitors.  HOME CARE INSTRUCTIONS  Raise the head of your bead by putting blocks under the legs.   Eat 2-3 hours before going to bed.   Stop smoking.   Try to reach and maintain a healthy weight.   Do not eat just a few very large meals. Instead, eat many smaller meals throughout the day.   Try to identify foods and beverages that make your symptoms worse, and avoid these.   Avoid tight clothing.   Do not exercise right after eating.  SEEK IMMEDIATE MEDICAL CARE IF YOU:  Have severe chest pain that goes down your arm, or into your jaw or neck.   Feel sweaty, dizzy, or lightheaded.   Are short of breath.   Throw up (vomit) blood.   Have difficulty or pain with swallowing.   Have bloody or black, tarry stools.   Have bouts of heartburn more than three times a week for more than two weeks.  Document Released: 10/24/2004 Document Re-Released: 12/11/2009 Exit[employer] Patient Information 2011 [employer]. 

## 2011-05-28 ENCOUNTER — Other Ambulatory Visit: Payer: Self-pay | Admitting: Otolaryngology

## 2011-05-28 DIAGNOSIS — E049 Nontoxic goiter, unspecified: Secondary | ICD-10-CM

## 2011-05-31 ENCOUNTER — Ambulatory Visit
Admission: RE | Admit: 2011-05-31 | Discharge: 2011-05-31 | Disposition: A | Discharge status: Home / Self Care | Payer: Managed Care, Other (non HMO) | Source: Ambulatory Visit | Attending: Otolaryngology | Admitting: Otolaryngology

## 2011-05-31 DIAGNOSIS — E049 Nontoxic goiter, unspecified: Secondary | ICD-10-CM

## 2011-06-17 ENCOUNTER — Other Ambulatory Visit: Payer: Self-pay | Admitting: Family Medicine

## 2011-06-18 MED ORDER — ESOMEPRAZOLE MAGNESIUM 40 MG PO CPDR: 40.0000 mg | DELAYED_RELEASE_CAPSULE | Freq: Every day | ORAL | Status: DC

## 2011-06-18 NOTE — Telephone Encounter (Signed)
Faxed.   KP 

## 2011-06-24 ENCOUNTER — Ambulatory Visit: Payer: Managed Care, Other (non HMO) | Admitting: Gastroenterology

## 2011-07-04 LAB — CBC
HCT: 24.8 % — ABNORMAL LOW (ref 36.0–46.0)
HCT: 26 % — ABNORMAL LOW (ref 36.0–46.0)
HCT: 26.8 % — ABNORMAL LOW (ref 36.0–46.0)
HCT: 31.1 % — ABNORMAL LOW (ref 36.0–46.0)
HCT: 32.1 % — ABNORMAL LOW (ref 36.0–46.0)
HCT: 33.5 % — ABNORMAL LOW (ref 36.0–46.0)
HCT: 33.6 % — ABNORMAL LOW (ref 36.0–46.0)
HCT: 41.3 % (ref 36.0–46.0)
Hemoglobin: 10.5 g/dL — ABNORMAL LOW (ref 12.0–15.0)
Hemoglobin: 9 g/dL — ABNORMAL LOW (ref 12.0–15.0)
Hemoglobin: 9.1 g/dL — ABNORMAL LOW (ref 12.0–15.0)
Hemoglobin: 9.5 g/dL — ABNORMAL LOW (ref 12.0–15.0)
Hemoglobin: 9.9 g/dL — ABNORMAL LOW (ref 12.0–15.0)
MCHC: 32.6 g/dL (ref 30.0–36.0)
MCHC: 32.7 g/dL (ref 30.0–36.0)
MCHC: 33.3 g/dL (ref 30.0–36.0)
MCHC: 33.4 g/dL (ref 30.0–36.0)
MCHC: 33.7 g/dL (ref 30.0–36.0)
MCHC: 33.8 g/dL (ref 30.0–36.0)
MCV: 94.7 fL (ref 78.0–100.0)
MCV: 95 fL (ref 78.0–100.0)
MCV: 96.3 fL (ref 78.0–100.0)
MCV: 97.5 fL (ref 78.0–100.0)
MCV: 98.3 fL (ref 78.0–100.0)
Platelets: 176 10*3/uL (ref 150–400)
Platelets: 213 10*3/uL (ref 150–400)
Platelets: 323 10*3/uL (ref 150–400)
Platelets: 447 10*3/uL — ABNORMAL HIGH (ref 150–400)
Platelets: 475 10*3/uL — ABNORMAL HIGH (ref 150–400)
Platelets: 518 10*3/uL — ABNORMAL HIGH (ref 150–400)
Platelets: 523 10*3/uL — ABNORMAL HIGH (ref 150–400)
RBC: 2.52 MIL/uL — ABNORMAL LOW (ref 3.87–5.11)
RBC: 2.92 MIL/uL — ABNORMAL LOW (ref 3.87–5.11)
RBC: 3.17 MIL/uL — ABNORMAL LOW (ref 3.87–5.11)
RDW: 12.5 % (ref 11.5–15.5)
RDW: 12.5 % (ref 11.5–15.5)
RDW: 12.8 % (ref 11.5–15.5)
RDW: 13.7 % (ref 11.5–15.5)
RDW: 14.5 % (ref 11.5–15.5)
RDW: 14.5 % (ref 11.5–15.5)
RDW: 14.7 % (ref 11.5–15.5)
WBC: 10 10*3/uL (ref 4.0–10.5)
WBC: 12.6 10*3/uL — ABNORMAL HIGH (ref 4.0–10.5)
WBC: 13.4 10*3/uL — ABNORMAL HIGH (ref 4.0–10.5)
WBC: 16 10*3/uL — ABNORMAL HIGH (ref 4.0–10.5)
WBC: 3.9 10*3/uL — ABNORMAL LOW (ref 4.0–10.5)
WBC: 8.6 10*3/uL (ref 4.0–10.5)

## 2011-07-04 LAB — GRAM STAIN: Gram Stain: NONE SEEN

## 2011-07-04 LAB — BASIC METABOLIC PANEL
BUN: 1 mg/dL — ABNORMAL LOW (ref 6–23)
BUN: 2 mg/dL — ABNORMAL LOW (ref 6–23)
BUN: 5 mg/dL — ABNORMAL LOW (ref 6–23)
BUN: 5 mg/dL — ABNORMAL LOW (ref 6–23)
BUN: 8 mg/dL (ref 6–23)
CO2: 26 mEq/L (ref 19–32)
CO2: 29 mEq/L (ref 19–32)
CO2: 29 mEq/L (ref 19–32)
Calcium: 7.4 mg/dL — ABNORMAL LOW (ref 8.4–10.5)
Calcium: 7.7 mg/dL — ABNORMAL LOW (ref 8.4–10.5)
Calcium: 8.2 mg/dL — ABNORMAL LOW (ref 8.4–10.5)
Chloride: 103 mEq/L (ref 96–112)
Chloride: 103 mEq/L (ref 96–112)
Creatinine, Ser: 0.49 mg/dL (ref 0.4–1.2)
Creatinine, Ser: 0.49 mg/dL (ref 0.4–1.2)
Creatinine, Ser: 0.52 mg/dL (ref 0.4–1.2)
Creatinine, Ser: 0.53 mg/dL (ref 0.4–1.2)
GFR calc Af Amer: >60 mL/min (ref >60)
GFR calc non Af Amer: >60 mL/min (ref >60)
GFR calc non Af Amer: >60 mL/min (ref >60)
GFR calc non Af Amer: >60 mL/min (ref >60)
Glucose, Bld: 116 mg/dL — ABNORMAL HIGH (ref 70–99)
Glucose, Bld: 120 mg/dL — ABNORMAL HIGH (ref 70–99)
Glucose, Bld: 132 mg/dL — ABNORMAL HIGH (ref 70–99)
Glucose, Bld: 91 mg/dL (ref 70–99)
Potassium: 3.8 mEq/L (ref 3.5–5.1)
Potassium: 3.9 mEq/L (ref 3.5–5.1)
Potassium: 4 mEq/L (ref 3.5–5.1)
Potassium: 4.1 mEq/L (ref 3.5–5.1)
Sodium: 132 mEq/L — ABNORMAL LOW (ref 135–145)
Sodium: 135 mEq/L (ref 135–145)

## 2011-07-04 LAB — ANAEROBIC CULTURE
Gram Stain: NONE SEEN
Gram Stain: NONE SEEN

## 2011-07-04 LAB — COMPREHENSIVE METABOLIC PANEL
CO2: 27 mEq/L (ref 19–32)
CO2: 29 mEq/L (ref 19–32)
Calcium: 9 mg/dL (ref 8.4–10.5)
Calcium: 9.1 mg/dL (ref 8.4–10.5)
Creatinine, Ser: 0.52 mg/dL (ref 0.4–1.2)
Creatinine, Ser: 0.54 mg/dL (ref 0.4–1.2)
GFR calc Af Amer: >60 mL/min (ref >60)
GFR calc non Af Amer: >60 mL/min (ref >60)
GFR calc non Af Amer: >60 mL/min (ref >60)
Glucose, Bld: 76 mg/dL (ref 70–99)
Glucose, Bld: 90 mg/dL (ref 70–99)
Total Protein: 7.3 g/dL (ref 6.0–8.3)

## 2011-07-04 LAB — APTT
aPTT: 29 seconds (ref 24–37)
aPTT: 30 seconds (ref 24–37)

## 2011-07-04 LAB — CROSSMATCH: ABO/RH(D): B NEG

## 2011-07-04 LAB — URINALYSIS, ROUTINE W REFLEX MICROSCOPIC
Bilirubin Urine: NEGATIVE
Hgb urine dipstick: NEGATIVE
Ketones, ur: 40 mg/dL — AB
Ketones, ur: NEGATIVE mg/dL
Nitrite: NEGATIVE
Nitrite: NEGATIVE
Nitrite: NEGATIVE
Protein, ur: NEGATIVE mg/dL
Protein, ur: NEGATIVE mg/dL
Specific Gravity, Urine: 1.012 (ref 1.005–1.030)
Urobilinogen, UA: 0.2 mg/dL (ref 0.0–1.0)
Urobilinogen, UA: 1 mg/dL (ref 0.0–1.0)
pH: 6 (ref 5.0–8.0)
pH: 7 (ref 5.0–8.0)

## 2011-07-04 LAB — PROTIME-INR
INR: 1.9 — ABNORMAL HIGH (ref 0.00–1.49)
Prothrombin Time: 13.2 seconds (ref 11.6–15.2)
Prothrombin Time: 16.7 seconds — ABNORMAL HIGH (ref 11.6–15.2)
Prothrombin Time: 23.5 seconds — ABNORMAL HIGH (ref 11.6–15.2)
Prothrombin Time: 24.1 seconds — ABNORMAL HIGH (ref 11.6–15.2)

## 2011-07-04 LAB — DIFFERENTIAL
Eosinophils Absolute: 0.2 10*3/uL (ref 0.0–0.7)
Lymphocytes Relative: 34 % (ref 12–46)
Lymphs Abs: 2.3 10*3/uL (ref 0.7–4.0)
Neutrophils Relative %: 54 % (ref 43–77)

## 2011-07-04 LAB — ABO/RH: ABO/RH(D): B NEG

## 2011-07-04 LAB — TYPE AND SCREEN

## 2011-07-04 LAB — URINE MICROSCOPIC-ADD ON

## 2011-07-04 LAB — WOUND CULTURE
Culture: NO GROWTH
Gram Stain: NONE SEEN

## 2011-07-04 LAB — D-DIMER, QUANTITATIVE: D-Dimer, Quant: 1.38 ug/mL-FEU — ABNORMAL HIGH (ref 0.00–0.48)

## 2011-07-04 LAB — PREPARE RBC (CROSSMATCH)

## 2011-11-25 ENCOUNTER — Ambulatory Visit: Payer: Managed Care, Other (non HMO) | Admitting: Family Medicine

## 2012-01-07 ENCOUNTER — Ambulatory Visit: Payer: Managed Care, Other (non HMO) | Admitting: Family Medicine

## 2012-01-07 DIAGNOSIS — Z0289 Encounter for other administrative examinations: Secondary | ICD-10-CM

## 2012-04-09 ENCOUNTER — Ambulatory Visit (INDEPENDENT_AMBULATORY_CARE_PROVIDER_SITE_OTHER): Payer: BC Managed Care – PPO | Admitting: Family Medicine

## 2012-04-09 ENCOUNTER — Encounter: Payer: Self-pay | Admitting: Family Medicine

## 2012-04-09 VITALS — BP 122/76 | HR 81 | Temp 98.1°F | Wt 187.6 lb

## 2012-04-09 DIAGNOSIS — S139XXA Sprain of joints and ligaments of unspecified parts of neck, initial encounter: Secondary | ICD-10-CM

## 2012-04-09 DIAGNOSIS — M459 Ankylosing spondylitis of unspecified sites in spine: Secondary | ICD-10-CM

## 2012-04-09 DIAGNOSIS — S161XXA Strain of muscle, fascia and tendon at neck level, initial encounter: Secondary | ICD-10-CM

## 2012-04-09 DIAGNOSIS — IMO0002 Reserved for concepts with insufficient information to code with codable children: Secondary | ICD-10-CM

## 2012-04-09 MED ORDER — TRAMADOL HCL 50 MG PO TABS: 50.0000 mg | ORAL_TABLET | Freq: Three times a day (TID) | ORAL | Status: AC | PRN

## 2012-04-09 MED ORDER — CYCLOBENZAPRINE HCL 10 MG PO TABS: 10.0000 mg | ORAL_TABLET | Freq: Three times a day (TID) | ORAL | Status: AC | PRN

## 2012-04-09 MED ORDER — PREDNISONE 10 MG PO TABS: ORAL_TABLET | ORAL | Status: DC

## 2012-04-09 NOTE — Progress Notes (Signed)
  Subjective:    Patient ID: [name], female    DOB: [birth year], 56 y.o.   MRN: [id number]  HPI Pt here c/o neck pain since --2 days after [place] a roller co[place] [employer].  Pain is at base of scalp on r and hurt s all the way to shoulder on right.  + difficult to turn head.   Review of Systems As above    Objective:   Physical Exam  Constitutional: She is oriented to person, place, and time. She appears well-developed and well-nourished.  Musculoskeletal: She exhibits tenderness.       Muscle spasms R trap with dec rom   Neurological: She is alert and oriented to person, place, and time.  Psychiatric: She has a normal mood and affect. Her behavior is normal. Judgment and thought content normal.          Assessment & Plan:

## 2012-04-09 NOTE — Patient Instructions (Addendum)

## 2012-04-10 DIAGNOSIS — IMO0002 Reserved for concepts with insufficient information to code with codable children: Secondary | ICD-10-CM | POA: Insufficient documentation

## 2012-04-10 HISTORY — DX: Reserved for concepts with insufficient information to code with codable children: IMO0002

## 2012-04-10 NOTE — Assessment & Plan Note (Signed)
Flexeril 10 mg  Steroid taper vicodin rto prn

## 2012-05-12 ENCOUNTER — Ambulatory Visit
Admission: RE | Admit: 2012-05-12 | Discharge: 2012-05-12 | Disposition: A | Discharge status: Home / Self Care | Payer: BC Managed Care – PPO | Source: Ambulatory Visit | Attending: Rheumatology | Admitting: Rheumatology

## 2012-05-12 ENCOUNTER — Other Ambulatory Visit: Payer: Self-pay | Admitting: Rheumatology

## 2012-05-12 DIAGNOSIS — M549 Dorsalgia, unspecified: Secondary | ICD-10-CM

## 2012-05-12 DIAGNOSIS — M542 Cervicalgia: Secondary | ICD-10-CM

## 2012-05-19 ENCOUNTER — Other Ambulatory Visit (HOSPITAL_COMMUNITY): Payer: Self-pay | Admitting: Obstetrics and Gynecology

## 2012-05-19 DIAGNOSIS — Z1231 Encounter for screening mammogram for malignant neoplasm of breast: Secondary | ICD-10-CM

## 2012-06-22 ENCOUNTER — Ambulatory Visit (HOSPITAL_COMMUNITY)
Admission: RE | Admit: 2012-06-22 | Discharge: 2012-06-22 | Disposition: A | Discharge status: Home / Self Care | Payer: BC Managed Care – PPO | Source: Ambulatory Visit | Attending: Obstetrics and Gynecology | Admitting: Obstetrics and Gynecology

## 2012-06-22 DIAGNOSIS — Z1231 Encounter for screening mammogram for malignant neoplasm of breast: Secondary | ICD-10-CM | POA: Insufficient documentation

## 2012-07-01 ENCOUNTER — Other Ambulatory Visit: Payer: Self-pay | Admitting: Gastroenterology

## 2012-07-02 ENCOUNTER — Other Ambulatory Visit: Payer: Self-pay | Admitting: Family Medicine

## 2012-10-13 ENCOUNTER — Encounter: Payer: Self-pay | Admitting: Family Medicine

## 2012-10-13 ENCOUNTER — Ambulatory Visit (INDEPENDENT_AMBULATORY_CARE_PROVIDER_SITE_OTHER): Payer: BC Managed Care – PPO | Admitting: Family Medicine

## 2012-10-13 ENCOUNTER — Ambulatory Visit (HOSPITAL_BASED_OUTPATIENT_CLINIC_OR_DEPARTMENT_OTHER)
Admission: RE | Admit: 2012-10-13 | Discharge: 2012-10-13 | Disposition: A | Discharge status: Home / Self Care | Payer: BC Managed Care – PPO | Source: Ambulatory Visit | Attending: Family Medicine | Admitting: Family Medicine

## 2012-10-13 ENCOUNTER — Other Ambulatory Visit: Payer: Self-pay | Admitting: Family Medicine

## 2012-10-13 VITALS — BP 132/74 | HR 81 | Temp 98.5°F | Wt 180.2 lb

## 2012-10-13 DIAGNOSIS — M79609 Pain in unspecified limb: Secondary | ICD-10-CM

## 2012-10-13 DIAGNOSIS — M79669 Pain in unspecified lower leg: Secondary | ICD-10-CM

## 2012-10-13 HISTORY — DX: Pain in unspecified lower leg: M79.669

## 2012-10-13 NOTE — Assessment & Plan Note (Signed)
Check doppler r/o dvt Elevate leg

## 2012-10-13 NOTE — Progress Notes (Signed)
  Subjective:    Patient ID: [name], female    DOB: 03/09/1956, 57 y.o.   MRN: [id number]  HPI Pt here c/o L calf pain x 1 1/2 days. No cp, no sob. No known injury.   Review of Systems As above    Objective:   Physical Exam  BP 132/74  Pulse 81  Temp 98.5 F (36.9 C) (Oral)  Wt 180 lb 3.2 oz (81.738 kg)  SpO2 99% [name] appearance: alert, cooperative, appears stated age and no distress Extremities: no swelling, not hot to touch,  + calf pain with palpation L calf     Assessment & Plan:

## 2012-11-30 ENCOUNTER — Ambulatory Visit: Payer: BC Managed Care – PPO | Admitting: Cardiology

## 2012-12-23 ENCOUNTER — Ambulatory Visit: Payer: BC Managed Care – PPO | Admitting: Cardiology

## 2012-12-24 ENCOUNTER — Encounter: Payer: Self-pay | Admitting: Cardiology

## 2012-12-24 ENCOUNTER — Ambulatory Visit (INDEPENDENT_AMBULATORY_CARE_PROVIDER_SITE_OTHER): Payer: BC Managed Care – PPO | Admitting: Cardiology

## 2012-12-24 VITALS — BP 143/83 | HR 66 | Ht 62.0 in | Wt 182.6 lb

## 2012-12-24 DIAGNOSIS — R079 Chest pain, unspecified: Secondary | ICD-10-CM

## 2012-12-24 NOTE — Patient Instructions (Addendum)
Your physician has requested that you have a lexiscan myoview. For further information please visit [email or web address]. Please follow instruction sheet, as given.  Your physician recommends that you schedule a follow-up appointment as needed with Dr McLean.   

## 2012-12-26 NOTE — Progress Notes (Signed)
Patient ID: [name], female   DOB: 1956/06/10, 57 y.o.   MRN: [id number] PCP: Dr. Laury Axon  57 yo with history of ankylosing spondylitis presents for evaluation of chest pain.  Patient has been having central chest aching that radiates from her upper back.  This has been off and on for years but has been worse recently.  There is no trigger.  It is not exertional.  Sometimes it occurs after meals but not always.  It can last for a few minutes to a half hour.  She is short of breath walking up steps but does fine walking on flat ground.   ECG: NSR, normal  PMH: 1. Ankylosing spondylitis: Follows with a rheumatologist.  History of hip/pelvis replacement in 2010.  2. Echo 2005: EF 55-65%, normal. 3. GERD 4. H/o gastric bypass 5. Atypical chest pain.   SH: On disability, nonsmoker, lives in [place], married  FH: Diabetes  ROS: All systems reviewed and negative except as per HPI.  Current Outpatient Prescriptions  Medication Sig Dispense Refill  . Acetaminophen (TYLENOL EXTRA STRENGTH PO) Take by mouth.        . calcium carbonate 200 MG capsule Take 250 mg by mouth 2 (two) times daily with a meal.        . multivitamin-iron-minerals-folic acid (CENTRUM) chewable tablet Chew 1 tablet by mouth daily.      Marland Kitchen NEXIUM 40 MG capsule TAKE 1 CAPSULE BY MOUTH DAILY BEFORE BREAKFAST.  30 capsule  3  . ONE TOUCH ULTRA TEST test strip USE ONCE DAILY  50 each  10  . POTASSIUM CHLORIDE CR PO Take by mouth.        . predniSONE (STERAPRED UNI-PAK) 10 MG tablet Take 10 mg by mouth as needed.       No current facility-administered medications for this visit.    BP 143/83  Pulse 66  Ht 5\' 2"  (1.575 m)  Wt 182 lb 9.6 oz (82.827 kg)  BMI 33.39 kg/m2 General: NAD Neck: No JVD, no thyromegaly or thyroid nodule.  Lungs: Clear to auscultation bilaterally with normal respiratory effort. CV: Nondisplaced PMI.  Heart regular S1/S2, no S3/S4, no murmur.  No peripheral edema.  No carotid bruit.  Normal  pedal pulses.  Abdomen: Soft, nontender, no hepatosplenomegaly, no distention.  Skin: Intact without lesions or rashes.  Neurologic: Alert and oriented x 3.  Psych: Normal affect. Extremities: No clubbing or cyanosis.  HEENT: Normal.   Assessment/Plan: 57 yo with history of atypical chest pain that has been occurring more recently lately.  She does not have classic risk factors but I suspect the ankylosing spondylitis, which is an inflammatory disease, does put her at somewhat higher risk for CAD.  She cannot walk on a treadmill (history of pelvis/hip replacement).  Therefore, I will arrange a Lexiscan Sestamibi stress test.  If this is normal, suspect noncardiac chest pain and she can followup as needed.   Marca Ancona 12/26/2012

## 2013-01-01 ENCOUNTER — Ambulatory Visit: Payer: BC Managed Care – PPO | Admitting: Cardiology

## 2013-01-21 ENCOUNTER — Ambulatory Visit (HOSPITAL_COMMUNITY): Payer: BC Managed Care – PPO | Attending: Cardiology | Admitting: Radiology

## 2013-01-21 VITALS — BP 127/82 | HR 61 | Ht 62.0 in | Wt 182.0 lb

## 2013-01-21 DIAGNOSIS — R0789 Other chest pain: Secondary | ICD-10-CM | POA: Insufficient documentation

## 2013-01-21 DIAGNOSIS — R5381 Other malaise: Secondary | ICD-10-CM | POA: Insufficient documentation

## 2013-01-21 DIAGNOSIS — R0989 Other specified symptoms and signs involving the circulatory and respiratory systems: Secondary | ICD-10-CM | POA: Insufficient documentation

## 2013-01-21 DIAGNOSIS — R079 Chest pain, unspecified: Secondary | ICD-10-CM

## 2013-01-21 DIAGNOSIS — R5383 Other fatigue: Secondary | ICD-10-CM | POA: Insufficient documentation

## 2013-01-21 DIAGNOSIS — R000 Tachycardia, unspecified: Secondary | ICD-10-CM | POA: Insufficient documentation

## 2013-01-21 DIAGNOSIS — R0609 Other forms of dyspnea: Secondary | ICD-10-CM | POA: Insufficient documentation

## 2013-01-21 DIAGNOSIS — R002 Palpitations: Secondary | ICD-10-CM | POA: Insufficient documentation

## 2013-01-21 DIAGNOSIS — R0602 Shortness of breath: Secondary | ICD-10-CM

## 2013-01-21 DIAGNOSIS — I10 Essential (primary) hypertension: Secondary | ICD-10-CM | POA: Insufficient documentation

## 2013-01-21 DIAGNOSIS — E669 Obesity, unspecified: Secondary | ICD-10-CM | POA: Insufficient documentation

## 2013-01-21 MED ORDER — REGADENOSON 0.4 MG/5ML IV SOLN
0.4000 mg | Freq: Once | INTRAVENOUS | Status: AC
  Administered 2013-01-21: 0.4 mg via INTRAVENOUS

## 2013-01-21 MED ORDER — TECHNETIUM TC 99M SESTAMIBI GENERIC - CARDIOLITE
11.0000 | Freq: Once | INTRAVENOUS | Status: AC | PRN
  Administered 2013-01-21: 11 via INTRAVENOUS

## 2013-01-21 MED ORDER — TECHNETIUM TC 99M SESTAMIBI GENERIC - CARDIOLITE
33.0000 | Freq: Once | INTRAVENOUS | Status: AC | PRN
  Administered 2013-01-21: 33 via INTRAVENOUS

## 2013-01-21 NOTE — Progress Notes (Addendum)
[facility] SITE 3 NUCLEAR MED [address] [place], [place] [address] [phone]    Cardiology Nuclear Med Study  [name] is a 57 y.o. female     MRN : [id number]     DOB: [birth year]  Procedure Date: 01/21/2013  Nuclear Med Background Indication for Stress Test:  Evaluation for Ischemia History:  '05 Echo:EF=65% Cardiac Risk Factors: Hypertension and Obesity  Symptoms:  Chest Pressure.  (last date of chest discomfort was about 3-weeks ago), DOE, Fatigue, Palpitations and Rapid HR   Nuclear Pre-Procedure Caffeine/Decaff Intake:  None > 12 hrs NPO After: 9:00pm   Lungs:  Clear. O2 Sat: 98% on room air. IV 0.9% NS with Angio Cath:  22g  IV Site: R Antecubital x 1, tolerated well IV Started by:  Irean Hong, RN  Chest Size (in):  38 Cup Size: DD  Height: 5\' 2"  (1.575 m)  Weight:  182 lb (82.555 kg)  BMI:  Body mass index is 33.28 kg/(m^2). Tech Comments:  n/a    Nuclear Med Study 1 or 2 day study: 1 day  Stress Test Type:  Lexiscan  Reading MD: Marca Ancona, MD  Order Authorizing Provider:  Marca Ancona, MD  Resting Radionuclide: Technetium 60m Sestamibi  Resting Radionuclide Dose: 11.0 mCi   Stress Radionuclide:  Technetium 64m Sestamibi  Stress Radionuclide Dose: 33.0 mCi           Stress Protocol Rest HR: 61 Stress HR: 137  Rest BP: 127/82 Stress BP: 145/85  Exercise Time (min): 2:00 METS: n/a   Predicted Max HR: 166 bpm % Max HR: 82.53 bpm Rate Pressure Product: 21308   Dose of Adenosine (mg):  n/a Dose of Lexiscan: 0.4 mg  Dose of Atropine (mg): n/a Dose of Dobutamine: n/a mcg/kg/min (at max HR)  Stress Test Technologist: Smiley Houseman, CMA-N  Nuclear Technologist:  Domenic Polite, CNMT     Rest Procedure:  Myocardial perfusion imaging was performed at rest 45 minutes following the intravenous administration of Technetium 66m Sestamibi.  Rest ECG: NSR - Normal EKG  Stress Procedure:  The patient received IV Lexiscan 0.4 mg over  15-seconds with concurrent low level exercise and then Technetium 11m Sestamibi was injected at 30-seconds while the patient continued walking one more minute.  Quantitative spect images were obtained after a 45-minute delay.  Stress ECG: No significant change from baseline ECG  QPS Raw Data Images:  Normal; no motion artifact; normal heart/lung ratio. Stress Images:  Small, mild apical perfusion defect. Rest Images:  Normal homogeneous uptake in all areas of the myocardium. Subtraction (SDS):  Reversible small, mild apical perfusion defect. Transient Ischemic Dilatation (Normal <1.22):  0.99 Lung/Heart Ratio (Normal <0.45):  0.40  Quantitative Gated Spect Images QGS EDV:  62 ml QGS ESV:  17 ml  Impression Exercise Capacity:  Lexiscan with low level exercise. BP Response:  Normal blood pressure response. Clinical Symptoms:  Short of breath.  ECG Impression:  No significant ST segment change suggestive of ischemia. Comparison with Prior Nuclear Study: No previous nuclear study performed  Overall Impression:  Low risk stress nuclear study.  Reversible, small mild apical perfusion defect.  Cannot rule out small area of ischemia.  However, could be shifting breast artifact given prominent breast shadowing.   LV Ejection Fraction: 73%.  LV Wall Motion:  NL LV Function; NL Wall Motion  Marca Ancona 01/21/2013  Low risk study, probably normal.  Marca Ancona 01/24/2013

## 2013-01-25 NOTE — Progress Notes (Signed)
Pt.notified

## 2013-03-16 ENCOUNTER — Encounter: Payer: Self-pay | Admitting: Gastroenterology

## 2013-03-16 ENCOUNTER — Telehealth: Payer: Self-pay | Admitting: Gastroenterology

## 2013-03-16 NOTE — Telephone Encounter (Signed)
Pt c/o stomach pain and acid reflux. Requests sooner appt. Pts appt moved to tomorrow at 9:30am. Pt aware of appt date and time.

## 2013-03-17 ENCOUNTER — Encounter: Payer: Self-pay | Admitting: Gastroenterology

## 2013-03-17 ENCOUNTER — Ambulatory Visit (INDEPENDENT_AMBULATORY_CARE_PROVIDER_SITE_OTHER): Payer: BC Managed Care – PPO | Admitting: Gastroenterology

## 2013-03-17 VITALS — BP 100/60 | HR 100 | Temp 98.5°F | Ht 62.0 in | Wt 186.2 lb

## 2013-03-17 DIAGNOSIS — K529 Noninfective gastroenteritis and colitis, unspecified: Secondary | ICD-10-CM

## 2013-03-17 DIAGNOSIS — K5289 Other specified noninfective gastroenteritis and colitis: Secondary | ICD-10-CM

## 2013-03-17 HISTORY — DX: Noninfective gastroenteritis and colitis, unspecified: K52.9

## 2013-03-17 MED ORDER — HYOSCYAMINE SULFATE ER 0.375 MG PO TBCR: EXTENDED_RELEASE_TABLET | ORAL | Status: DC

## 2013-03-17 NOTE — Patient Instructions (Addendum)
Call back in 5 days if no improvement

## 2013-03-17 NOTE — Assessment & Plan Note (Addendum)
Two-day history of nausea, upper abdominal pain and diarrhea. Symptoms most likely due to gastroenteritis.  Recommendations #1 hyomax when necessary

## 2013-03-17 NOTE — Progress Notes (Signed)
History of Present Illness:  Pleasant 57 year old Afro-American female with ankylosing spondylitis, GERD here for evaluation of nausea and abdominal pain. For the past 2 days she's had ongoing nausea, low-grade fevers, upper abdominal discomfort and diarrhea. She has a history of GERD that is usually well controlled with Nexium. In addition she complains of burning chest discomfort. Recent myocardial perfusion exam was normal. Colonoscopy in 2011 was normal. 2006 endoscopy demonstrated a 3 cm hiatal hernia and chronic gastritis.    Review of Systems: Diffuse joint aches. Pertinent positive and negative review of systems were noted in the above HPI section. All other review of systems were otherwise negative.    Current Medications, Allergies, Past Medical History, Past Surgical History, Family History and Social History were reviewed in [employer] electronic medical record  Vital signs were reviewed in today's medical record. Physical Exam: General: Well developed , well nourished, no acute distress Skin: anicteric Head: Normocephalic and atraumatic Eyes:  sclerae anicteric, EOMI Ears: Normal auditory acuity Mouth: No deformity or lesions Lungs: Clear throughout to auscultation Heart: Regular rate and rhythm; no murmurs, rubs or bruits Abdomen: Soft,  and non distended. No masses, hepatosplenomegaly or hernias noted. Normal Bowel sounds. There is mild tenderness in the midepigastrium Rectal:deferred Musculoskeletal: Symmetrical with no gross deformities  Pulses:  Normal pulses noted Extremities: No clubbing, cyanosis, edema or deformities noted Neurological: Alert oriented x 4, grossly nonfocal Psychological:  Alert and cooperative. Normal mood and affect

## 2013-04-19 ENCOUNTER — Ambulatory Visit: Payer: BC Managed Care – PPO | Admitting: Gastroenterology

## 2013-04-20 ENCOUNTER — Ambulatory Visit (INDEPENDENT_AMBULATORY_CARE_PROVIDER_SITE_OTHER): Payer: Medicare Other | Admitting: Family Medicine

## 2013-04-20 ENCOUNTER — Encounter: Payer: Self-pay | Admitting: Family Medicine

## 2013-04-20 DIAGNOSIS — IMO0002 Reserved for concepts with insufficient information to code with codable children: Secondary | ICD-10-CM

## 2013-04-20 DIAGNOSIS — E162 Hypoglycemia, unspecified: Secondary | ICD-10-CM

## 2013-04-20 HISTORY — DX: Reserved for concepts with insufficient information to code with codable children: IMO0002

## 2013-04-20 NOTE — Progress Notes (Signed)
  Subjective:    Patient ID: [name], female    DOB: 1956-06-06, 57 y.o.   MRN: [id number]  HPIpt here to discuss weight gain.  She has been on a lot of steroids and has gained a lot of weight. She is requesting a nutrition referral.     Review of Systems    as above Objective:   Physical Exam BP 120/88  Pulse 76  Temp(Src) 98.5 F (36.9 C) (Oral)  Wt 190 lb 12.8 oz (86.546 kg)  BMI 34.89 kg/m2  SpO2 97% [name] appearance: alert, cooperative, appears stated age and no distress Throat: lips, mucosa, and tongue normal; teeth and gums normal Neck: no adenopathy, no carotid bruit, no JVD, supple, symmetrical, trachea midline and thyroid not enlarged, symmetric, no tenderness/mass/nodules Lungs: clear to auscultation bilaterally Heart: S1, S2 normal Extremities: extremities normal, atraumatic, no cyanosis or edema        Assessment & Plan:

## 2013-04-20 NOTE — Patient Instructions (Signed)
Calorie Counting Diet A calorie counting diet requires you to eat the number of calories that are right for you in a day. Calories are the measurement of how much energy you get from the food you eat. Eating the right amount of calories is important for staying at a healthy weight. If you eat too many calories, your body will store them as fat and you may gain weight. If you eat too few calories, you may lose weight. Counting the number of calories you eat during a day will help you know if you are eating the right amount. A Registered Dietitian can determine how many calories you need in a day. The amount of calories needed varies from person to person. If your goal is to lose weight, you will need to eat fewer calories. Losing weight can benefit you if you are overweight or have health problems such as heart disease, high blood pressure, or diabetes. If your goal is to gain weight, you will need to eat more calories. Gaining weight may be necessary if you have a certain health problem that causes your body to need more energy. TIPS Whether you are increasing or decreasing the number of calories you eat during a day, it may be hard to get used to changes in what you eat and drink. The following are tips to help you keep track of the number of calories you eat.  Measure foods at home with measuring cups. This helps you know the amount of food and number of calories you are eating.  Restaurants often serve food in amounts that are larger than 1 serving. While eating out, estimate how many servings of a food you are given. For example, a serving of cooked rice is  cup or about the size of half of a fist. Knowing serving sizes will help you be aware of how much food you are eating at restaurants.  Ask for smaller portion sizes or child-size portions at restaurants.  Plan to eat half of a meal at a restaurant. Take the rest home or share the other half with a friend.  Read the Nutrition Facts panel on  food labels for calorie content and serving size. You can find out how many servings are in a package, the size of a serving, and the number of calories each serving has.  For example, a package might contain 3 cookies. The Nutrition Facts panel on that package says that 1 serving is 1 cookie. Below that, it will say there are 3 servings in the container. The calories section of the Nutrition Facts label says there are 90 calories. This means there are 90 calories in 1 cookie (1 serving). If you eat 1 cookie you have eaten 90 calories. If you eat all 3 cookies, you have eaten 270 calories (3 servings x 90 calories = 270 calories). The list below tells you how big or small some common portion sizes are.  1 oz.........4 stacked dice.  3 oz.........Deck of cards.  1 tsp........Tip of little finger.  1 tbs........Thumb.  2 tbs........Golf ball.   cup.......Half of a fist.  1 cup........A fist. KEEP A FOOD LOG Write down every food item you eat, the amount you eat, and the number of calories in each food you eat during the day. At the end of the day, you can add up the total number of calories you have eaten. It may help to keep a list like the one below. Find out the calorie information by reading the   Nutrition Facts panel on food labels. Breakfast  Bran cereal (1 cup, 110 calories).  Fat-free milk ( cup, 45 calories). Snack  Apple (1 medium, 80 calories). Lunch  Spinach (1 cup, 20 calories).  Tomato ( medium, 20 calories).  Chicken breast strips (3 oz, 165 calories).  Shredded cheddar cheese ( cup, 110 calories).  Light Italian dressing (2 tbs, 60 calories).  Whole-wheat bread (1 slice, 80 calories).  Tub margarine (1 tsp, 35 calories).  Vegetable soup (1 cup, 160 calories). Dinner  Pork chop (3 oz, 190 calories).  Brown rice (1 cup, 215 calories).  Steamed broccoli ( cup, 20 calories).  Strawberries (1  cup, 65 calories).  Whipped cream (1 tbs, 50  calories). Daily Calorie Total: 1425 Document Released: 09/16/2005 Document Revised: 12/09/2011 Document Reviewed: 03/13/2007 Exit[employer] Patient Information 2014 [employer].  

## 2013-04-20 NOTE — Assessment & Plan Note (Signed)
Refer to nutrition.

## 2013-04-20 NOTE — Assessment & Plan Note (Signed)
Check labs May need bmd rto for cpe

## 2013-04-21 ENCOUNTER — Telehealth: Payer: Self-pay | Admitting: Family Medicine

## 2013-04-21 LAB — HEPATIC FUNCTION PANEL
ALT: 21 U/L (ref 0–35)
Alkaline Phosphatase: 70 U/L (ref 39–117)
Bilirubin, Direct: 0.1 mg/dL (ref 0.0–0.3)
Total Bilirubin: 0.5 mg/dL (ref 0.3–1.2)

## 2013-04-21 LAB — LIPID PANEL
HDL: 83.6 mg/dL (ref >39.00)
LDL Cholesterol: 44 mg/dL (ref 0–99)
Total CHOL/HDL Ratio: 2
Triglycerides: 57 mg/dL (ref 0.0–149.0)

## 2013-04-21 LAB — POCT URINALYSIS DIPSTICK
Blood, UA: NEGATIVE
Glucose, UA: NEGATIVE
Spec Grav, UA: 1.01
Urobilinogen, UA: 0.2

## 2013-04-21 LAB — BASIC METABOLIC PANEL
CO2: 25 mEq/L (ref 19–32)
Calcium: 9 mg/dL (ref 8.4–10.5)
Glucose, Bld: 81 mg/dL (ref 70–99)
Potassium: 4.3 mEq/L (ref 3.5–5.1)
Sodium: 138 mEq/L (ref 135–145)

## 2013-04-21 NOTE — Telephone Encounter (Signed)
Patient states we were referring her to a nutritionist, however her insurance will not cover this. She states they will cover something called "intense behavior therapy" and it must be monitored by PCP. Patient would like to discuss her options.

## 2013-04-22 NOTE — Telephone Encounter (Signed)
You can ask Dr Dellia Cloud if he does that or if he know someone who does--- for weight management

## 2013-04-22 NOTE — Telephone Encounter (Signed)
Spoke with patient and she will wait for Dr. Laury Axon to return to address questions.  Please advise.

## 2013-07-11 ENCOUNTER — Other Ambulatory Visit: Payer: Self-pay | Admitting: Family Medicine

## 2013-07-15 ENCOUNTER — Ambulatory Visit: Payer: Medicare Other

## 2013-07-27 ENCOUNTER — Ambulatory Visit: Payer: Medicare Other

## 2013-08-05 ENCOUNTER — Other Ambulatory Visit: Payer: Self-pay

## 2013-09-16 ENCOUNTER — Ambulatory Visit (INDEPENDENT_AMBULATORY_CARE_PROVIDER_SITE_OTHER): Payer: Medicare Other | Admitting: Family Medicine

## 2013-09-16 ENCOUNTER — Encounter: Payer: Self-pay | Admitting: Family Medicine

## 2013-09-16 VITALS — BP 110/74 | HR 92 | Temp 98.3°F | Wt 191.0 lb

## 2013-09-16 DIAGNOSIS — J209 Acute bronchitis, unspecified: Secondary | ICD-10-CM

## 2013-09-16 MED ORDER — BENZONATATE 200 MG PO CAPS: 200.0000 mg | ORAL_CAPSULE | Freq: Two times a day (BID) | ORAL | Status: DC | PRN

## 2013-09-16 MED ORDER — FLUTICASONE PROPIONATE 50 MCG/ACT NA SUSP: 2.0000 | Freq: Every day | NASAL | Status: DC

## 2013-09-16 MED ORDER — AZITHROMYCIN 250 MG PO TABS: ORAL_TABLET | ORAL | Status: DC

## 2013-09-16 NOTE — Patient Instructions (Signed)

## 2013-09-16 NOTE — Progress Notes (Signed)
  Subjective:     [name] is a 57 y.o. female here for evaluation of a cough. Onset of symptoms was 1 week ago. Symptoms have been gradually worsening since that time. The cough is productive and is aggravated by cold air, infection and reclining position. Associated symptoms include: postnasal drip, shortness of breath and sputum production. Patient does not have a history of asthma. Patient does not have a history of environmental allergens. Patient has not traveled recently. Patient does not have a history of smoking. Patient has not had a previous chest x-ray. Patient has not had a PPD done.  The following portions of the patient's history were reviewed and updated as appropriate: allergies, current medications, past family history, past medical history, past social history, past surgical history and problem list.  Review of Systems Pertinent items are noted in HPI.    Objective:    Oxygen saturation 98% on room air BP 110/74  Pulse 92  Temp(Src) 98.3 F (36.8 C) (Oral)  Wt 191 lb (86.637 kg)  SpO2 98% Ears: normal TM's and external ear canals both ears Nose: green discharge, moderate congestion, turbinates red, swollen, no sinus tenderness Throat: abnormal findings: mild oropharyngeal erythema and PND Neck: mild anterior cervical adenopathy, supple, symmetrical, trachea midline and thyroid not enlarged, symmetric, no tenderness/mass/nodules Lungs: diminished breath sounds RML Heart: S1, S2 normal    Assessment:    Acute Bronchitis    Plan:    Antibiotics per medication orders. Antitussives per medication orders. Avoid exposure to tobacco smoke and fumes. Call if shortness of breath worsens, blood in sputum, change in character of cough, development of fever or chills, inability to maintain nutrition and hydration. Avoid exposure to tobacco smoke and fumes. Trial of antihistamines.

## 2013-09-16 NOTE — Progress Notes (Signed)
Pre visit review using our clinic review tool, if applicable. No additional management support is needed unless otherwise documented below in the visit note. 

## 2014-02-25 ENCOUNTER — Ambulatory Visit (INDEPENDENT_AMBULATORY_CARE_PROVIDER_SITE_OTHER): Payer: Medicare Other | Admitting: Endocrinology

## 2014-02-25 ENCOUNTER — Encounter: Payer: Self-pay | Admitting: Endocrinology

## 2014-02-25 VITALS — BP 126/84 | HR 74 | Temp 98.3°F | Resp 14 | Ht 62.0 in | Wt 195.2 lb

## 2014-02-25 DIAGNOSIS — E041 Nontoxic single thyroid nodule: Secondary | ICD-10-CM

## 2014-02-25 DIAGNOSIS — R400 Somnolence: Secondary | ICD-10-CM

## 2014-02-25 DIAGNOSIS — G4710 Hypersomnia, unspecified: Secondary | ICD-10-CM

## 2014-02-25 DIAGNOSIS — R635 Abnormal weight gain: Secondary | ICD-10-CM

## 2014-02-25 LAB — GLUCOSE, RANDOM: Glucose, Bld: 96 mg/dL (ref 70–99)

## 2014-02-25 LAB — TSH: TSH: 0.37 u[IU]/mL (ref 0.35–4.50)

## 2014-02-25 LAB — T4, FREE: FREE T4: 0.73 ng/dL (ref 0.60–1.60)

## 2014-02-25 NOTE — Progress Notes (Signed)
Patient ID: [name], female   DOB: 05/03/1956, 58 y.o.   MRN: [id number]    Reason for Appointment:  Thyroid nodule and other problems    History of Present Illness:   THYROID nodule: The patient's thyroid enlargement was first discovered in 2008 when she was being evaluated for cervical lymph nodes  Thyroid biopsy in 2009 of a 1.4 cm complex dominant nodule showed a colloid nodule She has had a stable thyroid nodule on ultrasound exams previously  Last ultrasound in 2012 reported the following:  Focal nodules: 13 x 13 x 20 mm hypoechoic solid, deep mid right (previously 11 x 14 x 20)  7 x 7 x 8 mm complex mostly solid, inferior right  There are at least five additional small nodules bilaterally, none measuring greater than 5 mm maximum diameter  She has had no difficulty with swallowing  Does not feel like she has any choking sensation in her neck or pressure in any position  Has mild neck discomfort which is not new She has not been treated with thyroid suppression  Previous thyroid levels have been consistently normal  Lab Results  Component Value Date   FREET4 0.73 02/25/2014   FREET4 0.9 01/25/2008   TSH 0.37 02/25/2014   TSH 1.32 06/27/2010   TSH 1.79 03/22/2009       Medication List       This list is accurate as of: 02/25/14 11:59 PM.  Always use your most recent med list.               azithromycin 250 MG tablet  Commonly known as:  ZITHROMAX Z-PAK  As directed     benzonatate 200 MG capsule  Commonly known as:  TESSALON  Take 1 capsule (200 mg total) by mouth 2 (two) times daily as needed for cough.     calcium carbonate 200 MG capsule  Take 250 mg by mouth 2 (two) times daily with a meal.     fluticasone 50 MCG/ACT nasal spray  Commonly known as:  FLONASE  Place 2 sprays into both nostrils daily.     multivitamin-iron-minerals-folic acid chewable tablet  Chew 1 tablet by mouth daily.     NEXIUM 40 MG capsule  Generic drug:  esomeprazole   TAKE 1 CAPSULE BY MOUTH DAILY BEFORE BREAKFAST.     ONE TOUCH ULTRA TEST test strip  Generic drug:  glucose blood  USE ONCE DAILY     POTASSIUM CHLORIDE CR PO  Take by mouth.     predniSONE 10 MG tablet  Commonly known as:  STERAPRED UNI-PAK  Take 10 mg by mouth as needed.     TYLENOL EXTRA STRENGTH PO  Take by mouth.        Allergies:  Allergies  Allergen Reactions  . Aspirin     REACTION: intolerance-gi upset  . Codeine   . Morphine   . Sulfonamide Derivatives     Past Medical History  Diagnosis Date  . GERD (gastroesophageal reflux disease)   . Hypertension   . Arthritis   . Gastritis   . Hiatal hernia   . Ankylosing spondylitis of multiple sites in spine     Past Surgical History  Procedure Laterality Date  . Esophagogastroduodenoscopy    . Breast reduction surgery  1996  . Total hip arthroplasty      x 2 left, one revision  . Bariatric surgery    . Pelvis replacement      Family History  Problem  Relation Age of Onset  . Melanoma Maternal Grandmother   . Breast cancer Maternal Grandmother     and Aunt  . Arthritis Father   . Hypertension Mother   . Hypertension Father   . Hypertension Sister   . Diabetes Father   . Diabetes Sister     Social History:  reports that she has never smoked. She does not have any smokeless tobacco history on file. She reports that she does not drink alcohol or use illicit drugs.   Review of Systems:  She is complaining about progressive weight gain since about  2011  She thinks she has gained a total of about  25lb   Notably she has had a history of gastric bypass surgery in 2006 and her weight in 2009 was 158 pounds She has not been exercising because of fatigue Not consistent with diet.  She is also concerned about feeling tired for about 1 year or so. Her main complaint is that she falls asleep especially after meals. Also has some history of snoring and has not had any discussion about this with her  PCP  There is no history of high blood pressure.              No history of Diabetes which is concerned about diabetes since there is a strong family history; she does get a little shaky before meals if she is late.      No history of depression         Examination:   BP 126/84  Pulse 74  Temp(Src) 98.3 F (36.8 C)  Resp 14  Ht 5\' 2"  (1.575 m)  Wt 195 lb 3.2 oz (88.542 kg)  BMI 35.69 kg/m2  SpO2 97%   General Appearance: pleasant, does not look cushingoid, generalized obesity present         Eyes: No abnormal prominence or eyelid swelling.          Neck: The thyroid is palpable with a nodule about 1.5-to cm on the right on swallowing. Left side is not palpable She is unable to extend her neck much because of her ankylosing spondylitis There is no lymphadenopathy .    Cardiovascular: Normal  heart sounds, no murmur Neurological: REFLEXES: at biceps are normal.  Extremities: No edema  Assessment/Plan:  1. Multinodular goiter with a small stable nodule since at least 2009 She has had a previous biopsy in May showed colloid nodule Discussed with the patient that since clinically the thyroid nodule is stable in had not changed in size between 2009 and 2012 no further ultrasound exams are needed She did not have any local pressure symptoms either  2. Fatigue and some daytime somnolence. She has had progressive weight gain and because of her history of snoring she may well have sleep apnea. Discussed implications of sleep apnea and need for evaluation and treatment  3. Weight gain. She is probably regained some of the weight she had lost with her gastric bypass surgery. She is currently not exercising and she was reminded about starting at least a walking program. Hopefully she will be more motivated once her fatigue is improved Also needs to followup with PCP regarding general care and considering nutritional counseling  4. Family history of diabetes and weight gain: Will do a  screening for glucose  Total face-to-face time including counseling = 30 minutes  Reather Littler 03/01/2014

## 2014-03-01 NOTE — Progress Notes (Signed)
Quick Note:  Please let patient know that the glucose and thyroid are normal  She will be scheduled for sleep study. To followup with PCP. Followup for thyroid in one year ______

## 2014-03-15 ENCOUNTER — Telehealth: Payer: Self-pay

## 2014-03-15 NOTE — Telephone Encounter (Signed)
Left message for call back Identifiable  Pap- 06/06/10 [facility]- 10/24/09- normal; repeat in 10 years--10/2019 [facility]- 06/22/12- negative Td-DUE

## 2014-03-15 NOTE — Telephone Encounter (Signed)
Medication List and allergies:  Reviewed and updated  90 day supply/mail order: n/a Local prescriptions:  [id number] - [place], [place] - [address]  Immunization due:  Tdap  A/P: Personal, family and PSH:  Reviewed and updated Pap- 2014-normal per patient; Dr. Rana SnareLowe at [facility]- 10/24/09- normal; repeat in 10 years (10/2019) [facility]- 2014-normal per patient Tdap-DUE  To discuss with Chelsy Parrales: Nothing at this time.

## 2014-03-16 ENCOUNTER — Other Ambulatory Visit: Payer: Self-pay | Admitting: Family Medicine

## 2014-03-16 ENCOUNTER — Encounter: Payer: Self-pay | Admitting: Family Medicine

## 2014-03-16 ENCOUNTER — Ambulatory Visit (INDEPENDENT_AMBULATORY_CARE_PROVIDER_SITE_OTHER): Payer: Medicare Other | Admitting: Family Medicine

## 2014-03-16 VITALS — BP 116/74 | HR 60 | Temp 98.2°F | Wt 192.4 lb

## 2014-03-16 DIAGNOSIS — I10 Essential (primary) hypertension: Secondary | ICD-10-CM

## 2014-03-16 DIAGNOSIS — K912 Postsurgical malabsorption, not elsewhere classified: Secondary | ICD-10-CM

## 2014-03-16 DIAGNOSIS — E669 Obesity, unspecified: Secondary | ICD-10-CM

## 2014-03-16 DIAGNOSIS — Z23 Encounter for immunization: Secondary | ICD-10-CM

## 2014-03-16 DIAGNOSIS — Z0000 Encounter for general adult medical examination without abnormal findings: Secondary | ICD-10-CM

## 2014-03-16 HISTORY — DX: Obesity, unspecified: E66.9

## 2014-03-16 LAB — BASIC METABOLIC PANEL
BUN: 11 mg/dL (ref 6–23)
CALCIUM: 9.2 mg/dL (ref 8.4–10.5)
CHLORIDE: 105 meq/L (ref 96–112)
CO2: 27 meq/L (ref 19–32)
CREATININE: 0.6 mg/dL (ref 0.4–1.2)
GFR: 138.55 mL/min (ref >60.00)
Glucose, Bld: 79 mg/dL (ref 70–99)
Potassium: 3.7 mEq/L (ref 3.5–5.1)
Sodium: 140 mEq/L (ref 135–145)

## 2014-03-16 LAB — CBC WITH DIFFERENTIAL/PLATELET
BASOS ABS: 0 10*3/uL (ref 0.0–0.1)
Basophils Relative: 0.6 % (ref 0.0–3.0)
EOS ABS: 0.1 10*3/uL (ref 0.0–0.7)
Eosinophils Relative: 1.5 % (ref 0.0–5.0)
HCT: 43.5 % (ref 36.0–46.0)
Hemoglobin: 14.2 g/dL (ref 12.0–15.0)
Lymphocytes Relative: 53.7 % — ABNORMAL HIGH (ref 12.0–46.0)
Lymphs Abs: 2 10*3/uL (ref 0.7–4.0)
MCHC: 32.7 g/dL (ref 30.0–36.0)
MCV: 97.1 fl (ref 78.0–100.0)
MONO ABS: 0.4 10*3/uL (ref 0.1–1.0)
Monocytes Relative: 9.5 % (ref 3.0–12.0)
NEUTROS PCT: 34.7 % — AB (ref 43.0–77.0)
Neutro Abs: 1.3 10*3/uL — ABNORMAL LOW (ref 1.4–7.7)
PLATELETS: 313 10*3/uL (ref 150.0–400.0)
RBC: 4.48 Mil/uL (ref 3.87–5.11)
RDW: 13.3 % (ref 11.5–15.5)
WBC: 3.7 10*3/uL — ABNORMAL LOW (ref 4.0–10.5)

## 2014-03-16 LAB — LIPID PANEL
Cholesterol: 139 mg/dL (ref 0–200)
HDL: 87.9 mg/dL (ref >39.00)
LDL CALC: 36 mg/dL (ref 0–99)
NonHDL: 51.1
TRIGLYCERIDES: 76 mg/dL (ref 0.0–149.0)
Total CHOL/HDL Ratio: 2
VLDL: 15.2 mg/dL (ref 0.0–40.0)

## 2014-03-16 LAB — HEPATIC FUNCTION PANEL
ALT: 20 U/L (ref 0–35)
AST: 25 U/L (ref 0–37)
Albumin: 4.4 g/dL (ref 3.5–5.2)
Alkaline Phosphatase: 71 U/L (ref 39–117)
BILIRUBIN DIRECT: 0 mg/dL (ref 0.0–0.3)
BILIRUBIN TOTAL: 0.4 mg/dL (ref 0.2–1.2)
Total Protein: 7.9 g/dL (ref 6.0–8.3)

## 2014-03-16 LAB — VITAMIN B12: Vitamin B-12: 686 pg/mL (ref 211–911)

## 2014-03-16 LAB — TSH: TSH: 0.49 u[IU]/mL (ref 0.35–4.50)

## 2014-03-16 NOTE — Progress Notes (Signed)
Pre visit review using our clinic review tool, if applicable. No additional management support is needed unless otherwise documented below in the visit note. 

## 2014-03-16 NOTE — Addendum Note (Signed)
Addended by: Silvio PateHOMPSON, SHEENA D on: 03/16/2014 11:32 AM   Modules accepted: Orders

## 2014-03-16 NOTE — Patient Instructions (Signed)
Preventive Care for Adults A healthy lifestyle and preventive care can promote health and wellness. Preventive health guidelines for women include the following key practices.  A routine yearly physical is a good way to check with your health care provider about your health and preventive screening. It is a chance to share any concerns and updates on your health and to receive a thorough exam.  Visit your dentist for a routine exam and preventive care every 6 months. Brush your teeth twice a day and floss once a day. Good oral hygiene prevents tooth decay and gum disease.  The frequency of eye exams is based on your age, health, family medical history, use of contact lenses, and other factors. Follow your health care provider's recommendations for frequency of eye exams.  Eat a healthy diet. Foods like vegetables, fruits, whole grains, low-fat dairy products, and lean protein foods contain the nutrients you need without too many calories. Decrease your intake of foods high in solid fats, added sugars, and salt. Eat the right amount of calories for you.Get information about a proper diet from your health care provider, if necessary.  Regular physical exercise is one of the most important things you can do for your health. Most adults should get at least 150 minutes of moderate-intensity exercise (any activity that increases your heart rate and causes you to sweat) each week. In addition, most adults need muscle-strengthening exercises on 2 or more days a week.  Maintain a healthy weight. The body mass index (BMI) is a screening tool to identify possible weight problems. It provides an estimate of body fat based on height and weight. Your health care provider can find your BMI, and can help you achieve or maintain a healthy weight.For adults 20 years and older:  A BMI below 18.5 is considered underweight.  A BMI of 18.5 to 24.9 is normal.  A BMI of 25 to 29.9 is considered overweight.  A BMI of  30 and above is considered obese.  Maintain normal blood lipids and cholesterol levels by exercising and minimizing your intake of saturated fat. Eat a balanced diet with plenty of fruit and vegetables. Blood tests for lipids and cholesterol should begin at age 52 and be repeated every 5 years. If your lipid or cholesterol levels are high, you are over 50, or you are at high risk for heart disease, you may need your cholesterol levels checked more frequently.Ongoing high lipid and cholesterol levels should be treated with medicines if diet and exercise are not working.  If you smoke, find out from your health care provider how to quit. If you do not use tobacco, do not start.  Lung cancer screening is recommended for adults aged 37-80 years who are at high risk for developing lung cancer because of a history of smoking. A yearly low-dose CT scan of the lungs is recommended for people who have at least a 30-pack-year history of smoking and are a current smoker or have quit within the past 15 years. A pack year of smoking is smoking an average of 1 pack of cigarettes a day for 1 year (for example: 1 pack a day for 30 years or 2 packs a day for 15 years). Yearly screening should continue until the smoker has stopped smoking for at least 15 years. Yearly screening should be stopped for people who develop a health problem that would prevent them from having lung cancer treatment.  If you are pregnant, do not drink alcohol. If you are breastfeeding,  be very cautious about drinking alcohol. If you are not pregnant and choose to drink alcohol, do not have more than 1 drink per day. One drink is considered to be 12 ounces (355 mL) of beer, 5 ounces (148 mL) of wine, or 1.5 ounces (44 mL) of liquor.  Avoid use of street drugs. Do not share needles with anyone. Ask for help if you need support or instructions about stopping the use of drugs.  High blood pressure causes heart disease and increases the risk of  stroke. Your blood pressure should be checked at least every 1 to 2 years. Ongoing high blood pressure should be treated with medicines if weight loss and exercise do not work.  If you are 75-52 years old, ask your health care provider if you should take aspirin to prevent strokes.  Diabetes screening involves taking a blood sample to check your fasting blood sugar level. This should be done once every 3 years, after age 15, if you are within normal weight and without risk factors for diabetes. Testing should be considered at a younger age or be carried out more frequently if you are overweight and have at least 1 risk factor for diabetes.  Breast cancer screening is essential preventive care for women. You should practice "breast self-awareness." This means understanding the normal appearance and feel of your breasts and may include breast self-examination. Any changes detected, no matter how small, should be reported to a health care provider. Women in their 58s and 30s should have a clinical breast exam (CBE) by a health care provider as part of a regular health exam every 1 to 3 years. After age 16, women should have a CBE every year. Starting at age 53, women should consider having a mammogram (breast X-ray test) every year. Women who have a family history of breast cancer should talk to their health care provider about genetic screening. Women at a high risk of breast cancer should talk to their health care providers about having an MRI and a mammogram every year.  Breast cancer gene (BRCA)-related cancer risk assessment is recommended for women who have family members with BRCA-related cancers. BRCA-related cancers include breast, ovarian, tubal, and peritoneal cancers. Having family members with these cancers may be associated with an increased risk for harmful changes (mutations) in the breast cancer genes BRCA1 and BRCA2. Results of the assessment will determine the need for genetic counseling and  BRCA1 and BRCA2 testing.  Routine pelvic exams to screen for cancer are no longer recommended for nonpregnant women who are considered low risk for cancer of the pelvic organs (ovaries, uterus, and vagina) and who do not have symptoms. Ask your health care provider if a screening pelvic exam is right for you.  If you have had past treatment for cervical cancer or a condition that could lead to cancer, you need Pap tests and screening for cancer for at least 20 years after your treatment. If Pap tests have been discontinued, your risk factors (such as having a new sexual partner) need to be reassessed to determine if screening should be resumed. Some women have medical problems that increase the chance of getting cervical cancer. In these cases, your health care provider may recommend more frequent screening and Pap tests.  The HPV test is an additional test that may be used for cervical cancer screening. The HPV test looks for the virus that can cause the cell changes on the cervix. The cells collected during the Pap test can be  tested for HPV. The HPV test could be used to screen women aged 47 years and older, and should be used in women of any age who have unclear Pap test results. After the age of 36, women should have HPV testing at the same frequency as a Pap test.  Colorectal cancer can be detected and often prevented. Most routine colorectal cancer screening begins at the age of 38 years and continues through age 58 years. However, your health care provider may recommend screening at an earlier age if you have risk factors for colon cancer. On a yearly basis, your health care provider may provide home test kits to check for hidden blood in the stool. Use of a small camera at the end of a tube, to directly examine the colon (sigmoidoscopy or colonoscopy), can detect the earliest forms of colorectal cancer. Talk to your health care provider about this at age 64, when routine screening begins. Direct  exam of the colon should be repeated every 5-10 years through age 21 years, unless early forms of pre-cancerous polyps or small growths are found.  People who are at an increased risk for hepatitis B should be screened for this virus. You are considered at high risk for hepatitis B if:  You were born in a country where hepatitis B occurs often. Talk with your health care provider about which countries are considered high risk.  Your parents were born in a high-risk country and you have not received a shot to protect against hepatitis B (hepatitis B vaccine).  You have HIV or AIDS.  You use needles to inject street drugs.  You live with, or have sex with, someone who has Hepatitis B.  You get hemodialysis treatment.  You take certain medicines for conditions like cancer, organ transplantation, and autoimmune conditions.  Hepatitis C blood testing is recommended for all people born from [birth year] through 1965 and any individual with known risks for hepatitis C.  Practice safe sex. Use condoms and avoid high-risk sexual practices to reduce the spread of sexually transmitted infections (STIs). STIs include gonorrhea, chlamydia, syphilis, trichomonas, herpes, HPV, and human immunodeficiency virus (HIV). Herpes, HIV, and HPV are viral illnesses that have no cure. They can result in disability, cancer, and death.  You should be screened for sexually transmitted illnesses (STIs) including gonorrhea and chlamydia if:  You are sexually active and are younger than 24 years.  You are older than 24 years and your health care provider tells you that you are at risk for this type of infection.  Your sexual activity has changed since you were last screened and you are at an increased risk for chlamydia or gonorrhea. Ask your health care provider if you are at risk.  If you are at risk of being infected with HIV, it is recommended that you take a prescription medicine daily to prevent HIV infection. This is  called preexposure prophylaxis (PrEP). You are considered at risk if:  You are a heterosexual woman, are sexually active, and are at increased risk for HIV infection.  You take drugs by injection.  You are sexually active with a partner who has HIV.  Talk with your health care provider about whether you are at high risk of being infected with HIV. If you choose to begin PrEP, you should first be tested for HIV. You should then be tested every 3 months for as long as you are taking PrEP.  Osteoporosis is a disease in which the bones lose minerals and strength  with aging. This can result in serious bone fractures or breaks. The risk of osteoporosis can be identified using a bone density scan. Women ages 65 years and over and women at risk for fractures or osteoporosis should discuss screening with their health care providers. Ask your health care provider whether you should take a calcium supplement or vitamin D to reduce the rate of osteoporosis.  Menopause can be associated with physical symptoms and risks. Hormone replacement therapy is available to decrease symptoms and risks. You should talk to your health care provider about whether hormone replacement therapy is right for you.  Use sunscreen. Apply sunscreen liberally and repeatedly throughout the day. You should seek shade when your shadow is shorter than you. Protect yourself by wearing long sleeves, pants, a wide-brimmed hat, and sunglasses year round, whenever you are outdoors.  Once a month, do a whole body skin exam, using a mirror to look at the skin on your back. Tell your health care provider of new moles, moles that have irregular borders, moles that are larger than a pencil eraser, or moles that have changed in shape or color.  Stay current with required vaccines (immunizations).  Influenza vaccine. All adults should be immunized every year.  Tetanus, diphtheria, and acellular pertussis (Td, Tdap) vaccine. Pregnant women should  receive 1 dose of Tdap vaccine during each pregnancy. The dose should be obtained regardless of the length of time since the last dose. Immunization is preferred during the 27th-36th week of gestation. An adult who has not previously received Tdap or who does not know her vaccine status should receive 1 dose of Tdap. This initial dose should be followed by tetanus and diphtheria toxoids (Td) booster doses every 10 years. Adults with an unknown or incomplete history of completing a 3-dose immunization series with Td-containing vaccines should begin or complete a primary immunization series including a Tdap dose. Adults should receive a Td booster every 10 years.  Varicella vaccine. An adult without evidence of immunity to varicella should receive 2 doses or a second dose if she has previously received 1 dose. Pregnant females who do not have evidence of immunity should receive the first dose after pregnancy. This first dose should be obtained before leaving the health care facility. The second dose should be obtained 4-8 weeks after the first dose.  Human papillomavirus (HPV) vaccine. Females aged 13-26 years who have not received the vaccine previously should obtain the 3-dose series. The vaccine is not recommended for use in pregnant females. However, pregnancy testing is not needed before receiving a dose. If a female is found to be pregnant after receiving a dose, no treatment is needed. In that case, the remaining doses should be delayed until after the pregnancy. Immunization is recommended for any person with an immunocompromised condition through the age of 26 years if she did not get any or all doses earlier. During the 3-dose series, the second dose should be obtained 4-8 weeks after the first dose. The third dose should be obtained 24 weeks after the first dose and 16 weeks after the second dose.  Zoster vaccine. One dose is recommended for adults aged 60 years or older unless certain conditions are  present.  Measles, mumps, and rubella (MMR) vaccine. Adults born before 1957 generally are considered immune to measles and mumps. Adults born in 1957 or later should have 1 or more doses of MMR vaccine unless there is a contraindication to the vaccine or there is laboratory evidence of immunity to   each of the three diseases. A routine second dose of MMR vaccine should be obtained at least 28 days after the first dose for students attending postsecondary schools, health care workers, or international travelers. People who received inactivated measles vaccine or an unknown type of measles vaccine during 1963-1967 should receive 2 doses of MMR vaccine. People who received inactivated mumps vaccine or an unknown type of mumps vaccine before 1979 and are at high risk for mumps infection should consider immunization with 2 doses of MMR vaccine. For females of childbearing age, rubella immunity should be determined. If there is no evidence of immunity, females who are not pregnant should be vaccinated. If there is no evidence of immunity, females who are pregnant should delay immunization until after pregnancy. Unvaccinated health care workers born before 1957 who lack laboratory evidence of measles, mumps, or rubella immunity or laboratory confirmation of disease should consider measles and mumps immunization with 2 doses of MMR vaccine or rubella immunization with 1 dose of MMR vaccine.  Pneumococcal 13-valent conjugate (PCV13) vaccine. When indicated, a person who is uncertain of her immunization history and has no record of immunization should receive the PCV13 vaccine. An adult aged 19 years or older who has certain medical conditions and has not been previously immunized should receive 1 dose of PCV13 vaccine. This PCV13 should be followed with a dose of pneumococcal polysaccharide (PPSV23) vaccine. The PPSV23 vaccine dose should be obtained at least 8 weeks after the dose of PCV13 vaccine. An adult aged 19  years or older who has certain medical conditions and previously received 1 or more doses of PPSV23 vaccine should receive 1 dose of PCV13. The PCV13 vaccine dose should be obtained 1 or more years after the last PPSV23 vaccine dose.  Pneumococcal polysaccharide (PPSV23) vaccine. When PCV13 is also indicated, PCV13 should be obtained first. All adults aged 65 years and older should be immunized. An adult younger than age 65 years who has certain medical conditions should be immunized. Any person who resides in a nursing home or long-term care facility should be immunized. An adult smoker should be immunized. People with an immunocompromised condition and certain other conditions should receive both PCV13 and PPSV23 vaccines. People with human immunodeficiency virus (HIV) infection should be immunized as soon as possible after diagnosis. Immunization during chemotherapy or radiation therapy should be avoided. Routine use of PPSV23 vaccine is not recommended for American Indians, [place] Natives, or people younger than 65 years unless there are medical conditions that require PPSV23 vaccine. When indicated, people who have unknown immunization and have no record of immunization should receive PPSV23 vaccine. One-time revaccination 5 years after the first dose of PPSV23 is recommended for people aged 19-64 years who have chronic kidney failure, nephrotic syndrome, asplenia, or immunocompromised conditions. People who received 1-2 doses of PPSV23 before age 65 years should receive another dose of PPSV23 vaccine at age 65 years or later if at least 5 years have passed since the previous dose. Doses of PPSV23 are not needed for people immunized with PPSV23 at or after age 65 years.  Meningococcal vaccine. Adults with asplenia or persistent complement component deficiencies should receive 2 doses of quadrivalent meningococcal conjugate (MenACWY-D) vaccine. The doses should be obtained at least 2 months apart.  Microbiologists working with certain meningococcal bacteria, military recruits, people at risk during an outbreak, and people who travel to or live in countries with a high rate of meningitis should be immunized. A first-year college student up through age   21 years who is living in a residence hall should receive a dose if she did not receive a dose on or after her 16th birthday. Adults who have certain high-risk conditions should receive one or more doses of vaccine.  Hepatitis A vaccine. Adults who wish to be protected from this disease, have certain high-risk conditions, work with hepatitis A-infected animals, work in hepatitis A research labs, or travel to or work in countries with a high rate of hepatitis A should be immunized. Adults who were previously unvaccinated and who anticipate close contact with an international adoptee during the first 60 days after arrival in the [place] from a country with a high rate of hepatitis A should be immunized.  Hepatitis B vaccine. Adults who wish to be protected from this disease, have certain high-risk conditions, may be exposed to blood or other infectious body fluids, are household contacts or sex partners of hepatitis B positive people, are clients or workers in certain care facilities, or travel to or work in countries with a high rate of hepatitis B should be immunized.  Haemophilus influenzae type b (Hib) vaccine. A previously unvaccinated person with asplenia or sickle cell disease or having a scheduled splenectomy should receive 1 dose of Hib vaccine. Regardless of previous immunization, a recipient of a hematopoietic stem cell transplant should receive a 3-dose series 6-12 months after her successful transplant. Hib vaccine is not recommended for adults with HIV infection. Preventive Services / Frequency Ages 43 to 39years  Blood pressure check.** / Every 1 to 2 years.  Lipid and cholesterol check.** / Every 5 years beginning at age  75.  Clinical breast exam.** / Every 3 years for women in their 32s and 74s.  BRCA-related cancer risk assessment.** / For women who have family members with a BRCA-related cancer (breast, ovarian, tubal, or peritoneal cancers).  Pap test.** / Every 2 years from ages 65 through 91. Every 3 years starting at age 34 through age 93 or 72 with a history of 3 consecutive normal Pap tests.  HPV screening.** / Every 3 years from ages 46 through ages 53 to 26 with a history of 3 consecutive normal Pap tests.  Hepatitis C blood test.** / For any individual with known risks for hepatitis C.  Skin self-exam. / Monthly.  Influenza vaccine. / Every year.  Tetanus, diphtheria, and acellular pertussis (Tdap, Td) vaccine.** / Consult your health care provider. Pregnant women should receive 1 dose of Tdap vaccine during each pregnancy. 1 dose of Td every 10 years.  Varicella vaccine.** / Consult your health care provider. Pregnant females who do not have evidence of immunity should receive the first dose after pregnancy.  HPV vaccine. / 3 doses over 6 months, if 70 and younger. The vaccine is not recommended for use in pregnant females. However, pregnancy testing is not needed before receiving a dose.  Measles, mumps, rubella (MMR) vaccine.** / You need at least 1 dose of MMR if you were born in 1957 or later. You may also need a 2nd dose. For females of childbearing age, rubella immunity should be determined. If there is no evidence of immunity, females who are not pregnant should be vaccinated. If there is no evidence of immunity, females who are pregnant should delay immunization until after pregnancy.  Pneumococcal 13-valent conjugate (PCV13) vaccine.** / Consult your health care provider.  Pneumococcal polysaccharide (PPSV23) vaccine.** / 1 to 2 doses if you smoke cigarettes or if you have certain conditions.  Meningococcal vaccine.** /  1 dose if you are age 70 to 51 years and a [place] living in a residence hall, or have one of several medical conditions, you need to get vaccinated against meningococcal disease. You may also need additional booster doses.  Hepatitis A vaccine.** / Consult your health care provider.  Hepatitis B vaccine.** / Consult your health care provider.  Haemophilus influenzae type b (Hib) vaccine.** / Consult your health care provider. Ages 40 to 64years  Blood pressure check.** / Every 1 to 2 years.  Lipid and cholesterol check.** / Every 5 years beginning at age 58 years.  Lung cancer screening. / Every year if you are aged 56-80 years and have a 30-pack-year history of smoking and currently smoke or have quit within the past 15 years. Yearly screening is stopped once you have quit smoking for at least 15 years or develop a health problem that would prevent you from having lung cancer treatment.  Clinical breast exam.** / Every year after age 35 years.  BRCA-related cancer risk assessment.** / For women who have family members with a BRCA-related cancer (breast, ovarian, tubal, or peritoneal cancers).  Mammogram.** / Every year beginning at age 109 years and continuing for as long as you are in good health. Consult with your health care provider.  Pap test.** / Every 3 years starting at age 44 years through age 94 or 70 years with a history of 3 consecutive normal Pap tests.  HPV screening.** / Every 3 years from ages 109 years through ages 50 to 30 years with a history of 3 consecutive normal Pap tests.  Fecal occult blood test (FOBT) of stool. / Every year beginning at age 73 years and continuing until age 59 years. You may not need to do this test if you get a colonoscopy every 10 years.  Flexible sigmoidoscopy or colonoscopy.** / Every 5 years for a flexible sigmoidoscopy or every 10 years for a colonoscopy beginning at age 68 years and continuing until age 12 years.  Hepatitis C blood test.** / For all people born from [birth year] through  1965 and any individual with known risks for hepatitis C.  Skin self-exam. / Monthly.  Influenza vaccine. / Every year.  Tetanus, diphtheria, and acellular pertussis (Tdap/Td) vaccine.** / Consult your health care provider. Pregnant women should receive 1 dose of Tdap vaccine during each pregnancy. 1 dose of Td every 10 years.  Varicella vaccine.** / Consult your health care provider. Pregnant females who do not have evidence of immunity should receive the first dose after pregnancy.  Zoster vaccine.** / 1 dose for adults aged 2 years or older.  Measles, mumps, rubella (MMR) vaccine.** / You need at least 1 dose of MMR if you were born in 1957 or later. You may also need a 2nd dose. For females of childbearing age, rubella immunity should be determined. If there is no evidence of immunity, females who are not pregnant should be vaccinated. If there is no evidence of immunity, females who are pregnant should delay immunization until after pregnancy.  Pneumococcal 13-valent conjugate (PCV13) vaccine.** / Consult your health care provider.  Pneumococcal polysaccharide (PPSV23) vaccine.** / 1 to 2 doses if you smoke cigarettes or if you have certain conditions.  Meningococcal vaccine.** / Consult your health care provider.  Hepatitis A vaccine.** / Consult your health care provider.  Hepatitis B vaccine.** / Consult your health care provider.  Haemophilus influenzae type b (Hib) vaccine.** / Consult your health care provider. Ages 48 years  and over  Blood pressure check.** / Every 1 to 2 years.  Lipid and cholesterol check.** / Every 5 years beginning at age 84 years.  Lung cancer screening. / Every year if you are aged 50-80 years and have a 30-pack-year history of smoking and currently smoke or have quit within the past 15 years. Yearly screening is stopped once you have quit smoking for at least 15 years or develop a health problem that would prevent you from having lung cancer  treatment.  Clinical breast exam.** / Every year after age 24 years.  BRCA-related cancer risk assessment.** / For women who have family members with a BRCA-related cancer (breast, ovarian, tubal, or peritoneal cancers).  Mammogram.** / Every year beginning at age 14 years and continuing for as long as you are in good health. Consult with your health care provider.  Pap test.** / Every 3 years starting at age 17 years through age 31 or 74 years with 3 consecutive normal Pap tests. Testing can be stopped between 65 and 70 years with 3 consecutive normal Pap tests and no abnormal Pap or HPV tests in the past 10 years.  HPV screening.** / Every 3 years from ages 30 years through ages 70 or 28 years with a history of 3 consecutive normal Pap tests. Testing can be stopped between 65 and 70 years with 3 consecutive normal Pap tests and no abnormal Pap or HPV tests in the past 10 years.  Fecal occult blood test (FOBT) of stool. / Every year beginning at age 64 years and continuing until age 92 years. You may not need to do this test if you get a colonoscopy every 10 years.  Flexible sigmoidoscopy or colonoscopy.** / Every 5 years for a flexible sigmoidoscopy or every 10 years for a colonoscopy beginning at age 73 years and continuing until age 39 years.  Hepatitis C blood test.** / For all people born from [birth year] through 1963 and any individual with known risks for hepatitis C.  Osteoporosis screening.** / A one-time screening for women ages 35 years and over and women at risk for fractures or osteoporosis.  Skin self-exam. / Monthly.  Influenza vaccine. / Every year.  Tetanus, diphtheria, and acellular pertussis (Tdap/Td) vaccine.** / 1 dose of Td every 10 years.  Varicella vaccine.** / Consult your health care provider.  Zoster vaccine.** / 1 dose for adults aged 59 years or older.  Pneumococcal 13-valent conjugate (PCV13) vaccine.** / Consult your health care provider.  Pneumococcal  polysaccharide (PPSV23) vaccine.** / 1 dose for all adults aged 8 years and older.  Meningococcal vaccine.** / Consult your health care provider.  Hepatitis A vaccine.** / Consult your health care provider.  Hepatitis B vaccine.** / Consult your health care provider.  Haemophilus influenzae type b (Hib) vaccine.** / Consult your health care provider. ** Family history and personal history of risk and conditions may change your health care provider's recommendations. Document Released: 11/12/2001 Document Revised: 09/21/2013 Document Reviewed: 02/11/2011 [facility] Patient Information 2015 [place], [place]. This information is not intended to replace advice given to you by your health care provider. Make sure you discuss any questions you have with your health care provider.

## 2014-03-16 NOTE — Progress Notes (Signed)
Subjective:    [name] is a 58 y.o. female who presents for a welcome to Medicare exam.   Cardiac risk factors: obesity (BMI >= 30 kg/m2) and sedentary lifestyle.  Activities of Daily Living  In your present state of health, do you have any difficulty performing the following activities?:  Preparing food and eating?: No Bathing yourself: No Getting dressed: No Using the toilet:No Moving around from place to place: No In the past year have you fallen or had a near fall?:No  Current exercise habits: The patient does not participate in regular exercise at present.   Dietary issues discussed: pt is watching her diet and trying to eat healthy   Depression Screen (Note: if answer to either of the following is "Yes", then a more complete depression screening is indicated)  Q1: Over the past two weeks, have you felt down, depressed or hopeless?no Q2: Over the past two weeks, have you felt little interest or pleasure in doing things? no   The following portions of the patient's history were reviewed and updated as appropriate:  She  has a past medical history of GERD (gastroesophageal reflux disease); Hypertension; Arthritis; Gastritis; Hiatal hernia; Ankylosing spondylitis of multiple sites in spine; and Thyroid nodule. She  does not have any pertinent problems on file. She  has past surgical history that includes Esophagogastroduodenoscopy; Breast reduction surgery (1996); Total hip arthroplasty; Bariatric Surgery; and pelvis replacement. Her family history includes Arthritis in her father; Breast cancer in her maternal grandmother; Diabetes in her father and sister; Hypertension in her father, mother, and sister; Melanoma in her maternal grandmother. She  reports that she has never smoked. She does not have any smokeless tobacco history on file. She reports that she does not drink alcohol or use illicit drugs. She has a current medication list which includes the following  prescription(s): acetaminophen, biotin, calcium carbonate, fluticasone, multivitamin-iron-minerals-folic acid, nexium, one touch ultra test, potassium chloride, and prednisone. Current Outpatient Prescriptions on File Prior to Visit  Medication Sig Dispense Refill  . Acetaminophen (TYLENOL EXTRA STRENGTH PO) Take by mouth.        . Biotin 5000 MCG TABS Take 1 tablet by mouth daily.      . calcium carbonate 200 MG capsule Take 250 mg by mouth 2 (two) times daily with a meal.        . fluticasone (FLONASE) 50 MCG/ACT nasal spray Place 2 sprays into both nostrils daily.  16 g  6  . multivitamin-iron-minerals-folic acid (CENTRUM) chewable tablet Chew 1 tablet by mouth daily.      Marland Kitchen. NEXIUM 40 MG capsule TAKE 1 CAPSULE BY MOUTH DAILY BEFORE BREAKFAST.  30 capsule  11  . ONE TOUCH ULTRA TEST test strip USE ONCE DAILY  50 each  10  . POTASSIUM CHLORIDE CR PO Take 595 mg by mouth.       . predniSONE (DELTASONE) 5 MG tablet Take 5 mg by mouth as needed.       No current facility-administered medications on file prior to visit.   She is allergic to aspirin; codeine; morphine; and sulfonamide derivatives.. Review of Systems  Review of Systems  Constitutional: Negative for activity change, appetite change and fatigue.  HENT: Negative for hearing loss, congestion, tinnitus and ear discharge.   Eyes: Negative for visual disturbance (see optho q1y -- vision corrected to 20/20 with glasses).  Respiratory: Negative for cough, chest tightness and shortness of breath.   Cardiovascular: Negative for chest pain, palpitations and leg swelling.  Gastrointestinal: Negative for abdominal pain, diarrhea, constipation and abdominal distention.  Genitourinary: Negative for urgency, frequency, decreased urine volume and difficulty urinating.  Musculoskeletal: Negative for back pain, arthralgias and gait problem.  Skin: Negative for color change, pallor and rash.  Neurological: Negative for dizziness, light-headedness,  numbness and headaches.  Hematological: Negative for adenopathy. Does not bruise/bleed easily.  Psychiatric/Behavioral: Negative for suicidal ideas, confusion, sleep disturbance, self-injury, dysphoric mood, decreased concentration and agitation.  Pt is able to read and write and can do all ADLs No risk for falling No abuse/ violence in home      Objective:     Vision by Snellen chart: opth Blood pressure 116/74, pulse 60, temperature 98.2 F (36.8 C), temperature source Oral, weight 192 lb 6.4 oz (87.272 kg), SpO2 98.00%. Body mass index is 35.18 kg/(m^2). BP 116/74  Pulse 60  Temp(Src) 98.2 F (36.8 C) (Oral)  Wt 192 lb 6.4 oz (87.272 kg)  SpO2 98% General appearance: alert, cooperative, appears stated age, no distress and moderately obese Head: Normocephalic, without obvious abnormality, atraumatic Eyes: conjunctivae/corneas clear. PERRL, EOM's intact. Fundi benign. Ears: normal TM's and external ear canals both ears Nose: Nares normal. Septum midline. Mucosa normal. No drainage or sinus tenderness. Throat: lips, mucosa, and tongue normal; teeth and gums normal Neck: no adenopathy, no carotid bruit, no JVD, supple, symmetrical, trachea midline and thyroid not enlarged, symmetric, no tenderness/mass/nodules Back: symmetric, no curvature. ROM normal. No CVA tenderness. Lungs: clear to auscultation bilaterally Breasts: gyn Heart: regular rate and rhythm, S1, S2 normal, no murmur, click, rub or gallop Abdomen: soft, non-tender; bowel sounds normal; no masses,  no organomegaly Pelvic: deferred--- gyn Extremities: extremities normal, atraumatic, no cyanosis or edema Pulses: 2+ and symmetric Skin: Skin color, texture, turgor normal. No rashes or lesions Lymph nodes: Cervical, supraclavicular, and axillary nodes normal. Neurologic: Alert and oriented X 3, normal strength and tone. Normal symmetric reflexes. Normal coordination and gait Psych-- no depression, no anxiety       Assessment:    cpe     Plan:    ghm utd Check labs See avs During the course of the visit the patient was educated and counseled about appropriate screening and preventive services including:   Influenza vaccine  Td vaccine  Screening electrocardiogram  Screening mammography  Screening Pap smear and pelvic exam   Bone densitometry screening  Colorectal cancer screening  Diabetes screening  Glaucoma screening  Advanced directives: has NO advanced directive - not interested in additional information  Patient Instructions (the written plan) was given to the patient.

## 2014-03-17 LAB — POCT URINALYSIS DIPSTICK
Bilirubin, UA: NEGATIVE
GLUCOSE UA: NEGATIVE
Ketones, UA: NEGATIVE
Leukocytes, UA: NEGATIVE
Nitrite, UA: NEGATIVE
Protein, UA: NEGATIVE
RBC UA: NEGATIVE
Spec Grav, UA: <=1.005
Urobilinogen, UA: 0.2
pH, UA: 6.5

## 2014-03-19 LAB — VITAMIN D 1,25 DIHYDROXY
VITAMIN D3 1, 25 (OH): 119 pg/mL
Vitamin D 1, 25 (OH)2 Total: 119 pg/mL — ABNORMAL HIGH (ref 18–72)

## 2014-03-21 ENCOUNTER — Telehealth: Payer: Self-pay | Admitting: Endocrinology

## 2014-03-21 NOTE — Telephone Encounter (Signed)
Patient states Dr. Lucianne MussKumar as supposed to set up her sleep study  Please advise patient   Thank You :)

## 2014-03-21 NOTE — Telephone Encounter (Signed)
Please see below, I don't see the referral in her chart?

## 2014-03-22 ENCOUNTER — Telehealth: Payer: Self-pay | Admitting: *Deleted

## 2014-03-22 NOTE — Telephone Encounter (Signed)
Referral was done on 02/25/14, please check with referral coordinator

## 2014-03-22 NOTE — Telephone Encounter (Signed)
[facility]

## 2014-03-22 NOTE — Telephone Encounter (Signed)
I [facility] with [place] [facility], she said you need to enter the sleep study as an order, not a referral.

## 2014-03-23 ENCOUNTER — Other Ambulatory Visit: Payer: Self-pay | Admitting: Endocrinology

## 2014-03-23 DIAGNOSIS — R400 Somnolence: Secondary | ICD-10-CM

## 2014-03-23 NOTE — Telephone Encounter (Signed)
Order is in, she needs to verify its ok

## 2014-03-24 ENCOUNTER — Other Ambulatory Visit: Payer: Self-pay | Admitting: *Deleted

## 2014-03-24 ENCOUNTER — Telehealth: Payer: Self-pay | Admitting: Endocrinology

## 2014-03-24 MED ORDER — PHENTERMINE-TOPIRAMATE ER 3.75-23 MG PO CP24: ORAL_CAPSULE | ORAL | Status: DC

## 2014-03-24 MED ORDER — PHENTERMINE-TOPIRAMATE ER 7.5-46 MG PO CP24: ORAL_CAPSULE | ORAL | Status: DC

## 2014-03-24 NOTE — Telephone Encounter (Signed)
Patient states that she and Dr. Lucianne MussKumar have talked about putting her on weight loss pills. Patient states that she would like to proceed with that. Also, patient states that no one has called her to schedule the sleep study.

## 2014-03-24 NOTE — Telephone Encounter (Signed)
She can start Qsymia 3.75 mg daily for 2 weeks and then 7.5 mg for 30 days and see me back in followup after that. She may need to find out if it is  covered by insurance first

## 2014-03-24 NOTE — Telephone Encounter (Signed)
Please see below, [name] did get the orders for the sleep study and should be scheduling that soon.

## 2014-03-24 NOTE — Telephone Encounter (Signed)
Patient called back stating that her insurance does not cover this medication but she states that she does want Dr. Lucianne MussKumar to write her a written prescription for this. [facility] because it is [facility] there. Patient is requesting a call when rx is ready for pickup.

## 2014-04-06 ENCOUNTER — Encounter: Payer: Self-pay | Admitting: Family Medicine

## 2014-04-06 ENCOUNTER — Encounter: Payer: Self-pay | Admitting: Endocrinology

## 2014-06-07 ENCOUNTER — Ambulatory Visit (HOSPITAL_BASED_OUTPATIENT_CLINIC_OR_DEPARTMENT_OTHER): Payer: Medicare Other

## 2014-07-15 ENCOUNTER — Other Ambulatory Visit: Payer: Self-pay

## 2014-08-01 ENCOUNTER — Encounter (HOSPITAL_BASED_OUTPATIENT_CLINIC_OR_DEPARTMENT_OTHER): Payer: Medicare Other

## 2014-08-02 ENCOUNTER — Other Ambulatory Visit: Payer: Self-pay | Admitting: Obstetrics and Gynecology

## 2014-08-03 LAB — CYTOLOGY - PAP

## 2014-08-15 ENCOUNTER — Encounter (HOSPITAL_BASED_OUTPATIENT_CLINIC_OR_DEPARTMENT_OTHER): Payer: Medicare Other

## 2014-10-08 ENCOUNTER — Other Ambulatory Visit: Payer: Self-pay | Admitting: Family Medicine

## 2014-11-15 DIAGNOSIS — R635 Abnormal weight gain: Secondary | ICD-10-CM | POA: Diagnosis not present

## 2014-11-15 DIAGNOSIS — L723 Sebaceous cyst: Secondary | ICD-10-CM | POA: Diagnosis not present

## 2014-11-15 DIAGNOSIS — A6000 Herpesviral infection of urogenital system, unspecified: Secondary | ICD-10-CM | POA: Diagnosis not present

## 2014-11-15 DIAGNOSIS — L639 Alopecia areata, unspecified: Secondary | ICD-10-CM | POA: Diagnosis not present

## 2015-04-13 DIAGNOSIS — L658 Other specified nonscarring hair loss: Secondary | ICD-10-CM | POA: Diagnosis not present

## 2015-04-13 DIAGNOSIS — L669 Cicatricial alopecia, unspecified: Secondary | ICD-10-CM | POA: Diagnosis not present

## 2015-05-01 ENCOUNTER — Telehealth: Payer: Self-pay | Admitting: Family Medicine

## 2015-05-01 ENCOUNTER — Encounter: Payer: Self-pay | Admitting: Family Medicine

## 2015-05-01 DIAGNOSIS — Z0289 Encounter for other administrative examinations: Secondary | ICD-10-CM

## 2015-05-01 NOTE — Telephone Encounter (Signed)
yes

## 2015-05-01 NOTE — Telephone Encounter (Signed)
Pt will be no show today 05/01/15 3:00pm, pt left vm 8/1 8:13am to reschedule, called pt and rescheduled for 05/11/15 10:30am and she states she couldn't come this afternoon because she could wait that long to eat, advised pt she could come in and do labs different day, pt decline, I advised of $50 no show and pt said ok. Charge for no show?

## 2015-05-10 ENCOUNTER — Telehealth: Payer: Self-pay | Admitting: Behavioral Health

## 2015-05-10 NOTE — Telephone Encounter (Signed)
Unable to reach patient at time of Pre-Visit Call.  Left message for patient to return call when available.    

## 2015-05-11 ENCOUNTER — Encounter: Payer: Self-pay | Admitting: Family Medicine

## 2015-05-11 ENCOUNTER — Ambulatory Visit (INDEPENDENT_AMBULATORY_CARE_PROVIDER_SITE_OTHER): Payer: Medicare Other | Admitting: Family Medicine

## 2015-05-11 VITALS — BP 128/82 | HR 69 | Temp 98.9°F | Ht 62.0 in | Wt 195.8 lb

## 2015-05-11 DIAGNOSIS — I10 Essential (primary) hypertension: Secondary | ICD-10-CM | POA: Diagnosis not present

## 2015-05-11 DIAGNOSIS — Z0000 Encounter for general adult medical examination without abnormal findings: Secondary | ICD-10-CM | POA: Diagnosis not present

## 2015-05-11 DIAGNOSIS — J302 Other seasonal allergic rhinitis: Secondary | ICD-10-CM

## 2015-05-11 DIAGNOSIS — Z9884 Bariatric surgery status: Secondary | ICD-10-CM | POA: Diagnosis not present

## 2015-05-11 LAB — HEPATIC FUNCTION PANEL
ALT: 21 U/L (ref 0–35)
AST: 27 U/L (ref 0–37)
Albumin: 4.1 g/dL (ref 3.5–5.2)
Alkaline Phosphatase: 74 U/L (ref 39–117)
BILIRUBIN TOTAL: 0.7 mg/dL (ref 0.2–1.2)
Bilirubin, Direct: 0.2 mg/dL (ref 0.0–0.3)
TOTAL PROTEIN: 7.6 g/dL (ref 6.0–8.3)

## 2015-05-11 LAB — CBC WITH DIFFERENTIAL/PLATELET
BASOS ABS: 0 10*3/uL (ref 0.0–0.1)
Basophils Relative: 0.5 % (ref 0.0–3.0)
Eosinophils Absolute: 0.1 10*3/uL (ref 0.0–0.7)
Eosinophils Relative: 1.3 % (ref 0.0–5.0)
HCT: 41.4 % (ref 36.0–46.0)
Hemoglobin: 13.5 g/dL (ref 12.0–15.0)
LYMPHS ABS: 2.3 10*3/uL (ref 0.7–4.0)
Lymphocytes Relative: 55.2 % — ABNORMAL HIGH (ref 12.0–46.0)
MCHC: 32.6 g/dL (ref 30.0–36.0)
MCV: 97.4 fl (ref 78.0–100.0)
MONO ABS: 0.4 10*3/uL (ref 0.1–1.0)
Monocytes Relative: 10.3 % (ref 3.0–12.0)
Neutro Abs: 1.3 10*3/uL — ABNORMAL LOW (ref 1.4–7.7)
Neutrophils Relative %: 32.7 % — ABNORMAL LOW (ref 43.0–77.0)
PLATELETS: 328 10*3/uL (ref 150.0–400.0)
RBC: 4.25 Mil/uL (ref 3.87–5.11)
RDW: 13.3 % (ref 11.5–15.5)
WBC: 4.1 10*3/uL (ref 4.0–10.5)

## 2015-05-11 LAB — POCT URINALYSIS DIPSTICK
Bilirubin, UA: NEGATIVE
Glucose, UA: NEGATIVE
KETONES UA: NEGATIVE
Leukocytes, UA: NEGATIVE
Nitrite, UA: NEGATIVE
PH UA: 6
PROTEIN UA: NEGATIVE
RBC UA: NEGATIVE
UROBILINOGEN UA: 2

## 2015-05-11 LAB — BASIC METABOLIC PANEL
BUN: 10 mg/dL (ref 6–23)
CO2: 28 meq/L (ref 19–32)
Calcium: 9.3 mg/dL (ref 8.4–10.5)
Chloride: 102 mEq/L (ref 96–112)
Creatinine, Ser: 0.53 mg/dL (ref 0.40–1.20)
GFR: 153.1 mL/min (ref >60.00)
GLUCOSE: 89 mg/dL (ref 70–99)
POTASSIUM: 3.7 meq/L (ref 3.5–5.1)
SODIUM: 140 meq/L (ref 135–145)

## 2015-05-11 LAB — LIPID PANEL
CHOL/HDL RATIO: 2
Cholesterol: 154 mg/dL (ref 0–200)
HDL: 78.7 mg/dL (ref >39.00)
LDL Cholesterol: 66 mg/dL (ref 0–99)
NonHDL: 74.87
Triglycerides: 44 mg/dL (ref 0.0–149.0)
VLDL: 8.8 mg/dL (ref 0.0–40.0)

## 2015-05-11 LAB — VITAMIN B12: VITAMIN B 12: 508 pg/mL (ref 211–911)

## 2015-05-11 LAB — TSH: TSH: 1.63 u[IU]/mL (ref 0.35–4.50)

## 2015-05-11 NOTE — Progress Notes (Signed)
Subjective:     [name] is a 59 y.o. female and is here for a comprehensive physical exam. The patient reports problems - headaches frequently,  she is concerned about bp and her R ear has been sensitive..  Social History   Social History  . Marital Status: Married    Spouse Name: N/A  . Number of Children: N/A  . Years of Education: N/A   Occupational History  . Not on file.   Social History Main Topics  . Smoking status: Never Smoker   . Smokeless tobacco: Not on file  . Alcohol Use: No  . Drug Use: No  . Sexual Activity: Not on file   Other Topics Concern  . Not on file   Social History Narrative   Occupation: Sports coach   Daily Caffeine use- 1 cup daily         Health Maintenance  Topic Date Due  . Hepatitis C Screening  01/13/1956  . MAMMOGRAM  06/22/2014  . INFLUENZA VACCINE  05/01/2015  . HIV Screening  05/10/2016 (Originally 09/14/1971)  . PAP SMEAR  08/02/2017  . COLONOSCOPY  10/25/2019  . TETANUS/TDAP  03/16/2024    The following portions of the patient's history were reviewed and updated as appropriate:  She  has a past medical history of GERD (gastroesophageal reflux disease); Hypertension; Arthritis; Gastritis; Hiatal hernia; Ankylosing spondylitis of multiple sites in spine; and Thyroid nodule. She  does not have any pertinent problems on file. She  has past surgical history that includes Esophagogastroduodenoscopy; Breast reduction surgery (1996); Total hip arthroplasty; Bariatric Surgery; and pelvis replacement. Her family history includes Arthritis in her father; Breast cancer in her maternal grandmother; Diabetes in her father and sister; Hypertension in her father, mother, and sister; Melanoma in her maternal grandmother. She  reports that she has never smoked. She does not have any smokeless tobacco history on file. She reports that she does not drink alcohol or use illicit drugs. She has a current medication list which includes  the following prescription(s): acetaminophen, biotin, calcium carbonate, fluticasone, multivitamin-iron-minerals-folic acid, nexium, one touch ultra test, potassium chloride, and prednisone. Current Outpatient Prescriptions on File Prior to Visit  Medication Sig Dispense Refill  . Acetaminophen (TYLENOL EXTRA STRENGTH PO) Take by mouth.      . Biotin 5000 MCG TABS Take 1 tablet by mouth daily.    . calcium carbonate 200 MG capsule Take 250 mg by mouth 2 (two) times daily with a meal.      . fluticasone (FLONASE) 50 MCG/ACT nasal spray Place 2 sprays into both nostrils daily. 16 g 6  . multivitamin-iron-minerals-folic acid (CENTRUM) chewable tablet Chew 1 tablet by mouth daily.    Marland Kitchen NEXIUM 40 MG capsule TAKE 1 CAPSULE BY MOUTH DAILY BEFORE BREAKFAST. 30 capsule 5  . ONE TOUCH ULTRA TEST test strip USE ONCE DAILY 50 each 10  . POTASSIUM CHLORIDE CR PO Take 595 mg by mouth.     . predniSONE (DELTASONE) 5 MG tablet Take 5 mg by mouth as needed.     No current facility-administered medications on file prior to visit.   She is allergic to aspirin; codeine; morphine; and sulfonamide derivatives..  Review of Systems Review of Systems  Constitutional: Negative for activity change, appetite change and fatigue.  HENT: Negative for hearing loss, congestion, tinnitus and ear discharge.  dentist q102m Eyes: Negative for visual disturbance (see optho q1y -- vision corrected to 20/20 with glasses).  Respiratory: Negative for cough, chest tightness  and shortness of breath.   Cardiovascular: Negative for chest pain, palpitations and leg swelling.  Gastrointestinal: Negative for abdominal pain, diarrhea, constipation and abdominal distention.  Genitourinary: Negative for urgency, frequency, decreased urine volume and difficulty urinating.  Musculoskeletal: Negative for back pain, arthralgias and gait problem.  Skin: Negative for color change, pallor and rash.  Neurological: Negative for dizziness,  light-headedness, numbness and headaches.  Hematological: Negative for adenopathy. Does not bruise/bleed easily.  Psychiatric/Behavioral: Negative for suicidal ideas, confusion, sleep disturbance, self-injury, dysphoric mood, decreased concentration and agitation.       Objective:    BP 128/82 mmHg  Pulse 69  Temp(Src) 98.9 F (37.2 C) (Oral)  Ht  (1.575 m)  Wt 195 lb 12.8 oz (88.814 kg)  BMI 35.80 kg/m2  SpO2 98% General appearance: alert, cooperative, appears stated age and no distress Head: Normocephalic, without obvious abnormality, atraumatic Eyes: conjunctivae/corneas clear. PERRL, EOM's intact. Fundi benign. Ears: normal TM's and external ear canals both ears Nose: Nares normal. Septum midline. Mucosa normal. No drainage or sinus tenderness. Throat: lips, mucosa, and tongue normal; teeth and gums normal Neck: no adenopathy, no carotid bruit, no JVD, supple, symmetrical, trachea midline and thyroid not enlarged, symmetric, no tenderness/mass/nodules Back: symmetric, no curvature. ROM normal. No CVA tenderness. Lungs: clear to auscultation bilaterally Breasts: gyn Heart: regular rate and rhythm, S1, S2 normal, no murmur, click, rub or gallop Abdomen: soft, non-tender; bowel sounds normal; no masses,  no organomegaly Pelvic: deferred--gyn Extremities: extremities normal, atraumatic, no cyanosis or edema Pulses: 2+ and symmetric Skin: Skin color, texture, turgor normal. No rashes or lesions Lymph nodes: Cervical, supraclavicular, and axillary nodes normal. Neurologic: Alert and oriented X 3, normal strength and tone. Normal symmetric reflexes. Normal coordination and gait Psych--no depression, no anxiety      Assessment:    Healthy female exam.      Plan:    ghm utd  Check labs See After Visit Summary for Counseling Recommendations   1. Preventative health care See above - Basic metabolic panel - CBC with Differential/Platelet - Hepatic function panel -  Lipid panel - POCT urinalysis dipstick - TSH

## 2015-05-11 NOTE — Patient Instructions (Signed)
Preventive Care for Adults A healthy lifestyle and preventive care can promote health and wellness. Preventive health guidelines for women include the following key practices.  A routine yearly physical is a good way to check with your health care provider about your health and preventive screening. It is a chance to share any concerns and updates on your health and to receive a thorough exam.  Visit your dentist for a routine exam and preventive care every 6 months. Brush your teeth twice a day and floss once a day. Good oral hygiene prevents tooth decay and gum disease.  The frequency of eye exams is based on your age, health, family medical history, use of contact lenses, and other factors. Follow your health care provider's recommendations for frequency of eye exams.  Eat a healthy diet. Foods like vegetables, fruits, whole grains, low-fat dairy products, and lean protein foods contain the nutrients you need without too many calories. Decrease your intake of foods high in solid fats, added sugars, and salt. Eat the right amount of calories for you.Get information about a proper diet from your health care provider, if necessary.  Regular physical exercise is one of the most important things you can do for your health. Most adults should get at least 150 minutes of moderate-intensity exercise (any activity that increases your heart rate and causes you to sweat) each week. In addition, most adults need muscle-strengthening exercises on 2 or more days a week.  Maintain a healthy weight. The body mass index (BMI) is a screening tool to identify possible weight problems. It provides an estimate of body fat based on height and weight. Your health care provider can find your BMI and can help you achieve or maintain a healthy weight.For adults 20 years and older:  A BMI below 18.5 is considered underweight.  A BMI of 18.5 to 24.9 is normal.  A BMI of 25 to 29.9 is considered overweight.  A BMI of  30 and above is considered obese.  Maintain normal blood lipids and cholesterol levels by exercising and minimizing your intake of saturated fat. Eat a balanced diet with plenty of fruit and vegetables. Blood tests for lipids and cholesterol should begin at age 3 and be repeated every 5 years. If your lipid or cholesterol levels are high, you are over 50, or you are at high risk for heart disease, you may need your cholesterol levels checked more frequently.Ongoing high lipid and cholesterol levels should be treated with medicines if diet and exercise are not working.  If you smoke, find out from your health care provider how to quit. If you do not use tobacco, do not start.  Lung cancer screening is recommended for adults aged 32-80 years who are at high risk for developing lung cancer because of a history of smoking. A yearly low-dose CT scan of the lungs is recommended for people who have at least a 30-pack-year history of smoking and are a current smoker or have quit within the past 15 years. A pack year of smoking is smoking an average of 1 pack of cigarettes a day for 1 year (for example: 1 pack a day for 30 years or 2 packs a day for 15 years). Yearly screening should continue until the smoker has stopped smoking for at least 15 years. Yearly screening should be stopped for people who develop a health problem that would prevent them from having lung cancer treatment.  If you are pregnant, do not drink alcohol. If you are breastfeeding,  be very cautious about drinking alcohol. If you are not pregnant and choose to drink alcohol, do not have more than 1 drink per day. One drink is considered to be 12 ounces (355 mL) of beer, 5 ounces (148 mL) of wine, or 1.5 ounces (44 mL) of liquor.  Avoid use of street drugs. Do not share needles with anyone. Ask for help if you need support or instructions about stopping the use of drugs.  High blood pressure causes heart disease and increases the risk of  stroke. Your blood pressure should be checked at least every 1 to 2 years. Ongoing high blood pressure should be treated with medicines if weight loss and exercise do not work.  If you are 75-52 years old, ask your health care provider if you should take aspirin to prevent strokes.  Diabetes screening involves taking a blood sample to check your fasting blood sugar level. This should be done once every 3 years, after age 15, if you are within normal weight and without risk factors for diabetes. Testing should be considered at a younger age or be carried out more frequently if you are overweight and have at least 1 risk factor for diabetes.  Breast cancer screening is essential preventive care for women. You should practice "breast self-awareness." This means understanding the normal appearance and feel of your breasts and may include breast self-examination. Any changes detected, no matter how small, should be reported to a health care provider. Women in their 58s and 30s should have a clinical breast exam (CBE) by a health care provider as part of a regular health exam every 1 to 3 years. After age 16, women should have a CBE every year. Starting at age 53, women should consider having a mammogram (breast X-ray test) every year. Women who have a family history of breast cancer should talk to their health care provider about genetic screening. Women at a high risk of breast cancer should talk to their health care providers about having an MRI and a mammogram every year.  Breast cancer gene (BRCA)-related cancer risk assessment is recommended for women who have family members with BRCA-related cancers. BRCA-related cancers include breast, ovarian, tubal, and peritoneal cancers. Having family members with these cancers may be associated with an increased risk for harmful changes (mutations) in the breast cancer genes BRCA1 and BRCA2. Results of the assessment will determine the need for genetic counseling and  BRCA1 and BRCA2 testing.  Routine pelvic exams to screen for cancer are no longer recommended for nonpregnant women who are considered low risk for cancer of the pelvic organs (ovaries, uterus, and vagina) and who do not have symptoms. Ask your health care provider if a screening pelvic exam is right for you.  If you have had past treatment for cervical cancer or a condition that could lead to cancer, you need Pap tests and screening for cancer for at least 20 years after your treatment. If Pap tests have been discontinued, your risk factors (such as having a new sexual partner) need to be reassessed to determine if screening should be resumed. Some women have medical problems that increase the chance of getting cervical cancer. In these cases, your health care provider may recommend more frequent screening and Pap tests.  The HPV test is an additional test that may be used for cervical cancer screening. The HPV test looks for the virus that can cause the cell changes on the cervix. The cells collected during the Pap test can be  tested for HPV. The HPV test could be used to screen women aged 55 years and older, and should be used in women of any age who have unclear Pap test results. After the age of 15, women should have HPV testing at the same frequency as a Pap test.  Colorectal cancer can be detected and often prevented. Most routine colorectal cancer screening begins at the age of 64 years and continues through age 69 years. However, your health care provider may recommend screening at an earlier age if you have risk factors for colon cancer. On a yearly basis, your health care provider may provide home test kits to check for hidden blood in the stool. Use of a small camera at the end of a tube, to directly examine the colon (sigmoidoscopy or colonoscopy), can detect the earliest forms of colorectal cancer. Talk to your health care provider about this at age 52, when routine screening begins. Direct  exam of the colon should be repeated every 5-10 years through age 50 years, unless early forms of pre-cancerous polyps or small growths are found.  People who are at an increased risk for hepatitis B should be screened for this virus. You are considered at high risk for hepatitis B if:  You were born in a country where hepatitis B occurs often. Talk with your health care provider about which countries are considered high risk.  Your parents were born in a high-risk country and you have not received a shot to protect against hepatitis B (hepatitis B vaccine).  You have HIV or AIDS.  You use needles to inject street drugs.  You live with, or have sex with, someone who has hepatitis B.  You get hemodialysis treatment.  You take certain medicines for conditions like cancer, organ transplantation, and autoimmune conditions.  Hepatitis C blood testing is recommended for all people born from [birth year] through 1963 and any individual with known risks for hepatitis C.  Practice safe sex. Use condoms and avoid high-risk sexual practices to reduce the spread of sexually transmitted infections (STIs). STIs include gonorrhea, chlamydia, syphilis, trichomonas, herpes, HPV, and human immunodeficiency virus (HIV). Herpes, HIV, and HPV are viral illnesses that have no cure. They can result in disability, cancer, and death.  You should be screened for sexually transmitted illnesses (STIs) including gonorrhea and chlamydia if:  You are sexually active and are younger than 24 years.  You are older than 24 years and your health care provider tells you that you are at risk for this type of infection.  Your sexual activity has changed since you were last screened and you are at an increased risk for chlamydia or gonorrhea. Ask your health care provider if you are at risk.  If you are at risk of being infected with HIV, it is recommended that you take a prescription medicine daily to prevent HIV infection. This is  called preexposure prophylaxis (PrEP). You are considered at risk if:  You are a heterosexual woman, are sexually active, and are at increased risk for HIV infection.  You take drugs by injection.  You are sexually active with a partner who has HIV.  Talk with your health care provider about whether you are at high risk of being infected with HIV. If you choose to begin PrEP, you should first be tested for HIV. You should then be tested every 3 months for as long as you are taking PrEP.  Osteoporosis is a disease in which the bones lose minerals and strength  with aging. This can result in serious bone fractures or breaks. The risk of osteoporosis can be identified using a bone density scan. Women ages 65 years and over and women at risk for fractures or osteoporosis should discuss screening with their health care providers. Ask your health care provider whether you should take a calcium supplement or vitamin D to reduce the rate of osteoporosis.  Menopause can be associated with physical symptoms and risks. Hormone replacement therapy is available to decrease symptoms and risks. You should talk to your health care provider about whether hormone replacement therapy is right for you.  Use sunscreen. Apply sunscreen liberally and repeatedly throughout the day. You should seek shade when your shadow is shorter than you. Protect yourself by wearing long sleeves, pants, a wide-brimmed hat, and sunglasses year round, whenever you are outdoors.  Once a month, do a whole body skin exam, using a mirror to look at the skin on your back. Tell your health care provider of new moles, moles that have irregular borders, moles that are larger than a pencil eraser, or moles that have changed in shape or color.  Stay current with required vaccines (immunizations).  Influenza vaccine. All adults should be immunized every year.  Tetanus, diphtheria, and acellular pertussis (Td, Tdap) vaccine. Pregnant women should  receive 1 dose of Tdap vaccine during each pregnancy. The dose should be obtained regardless of the length of time since the last dose. Immunization is preferred during the 27th-36th week of gestation. An adult who has not previously received Tdap or who does not know her vaccine status should receive 1 dose of Tdap. This initial dose should be followed by tetanus and diphtheria toxoids (Td) booster doses every 10 years. Adults with an unknown or incomplete history of completing a 3-dose immunization series with Td-containing vaccines should begin or complete a primary immunization series including a Tdap dose. Adults should receive a Td booster every 10 years.  Varicella vaccine. An adult without evidence of immunity to varicella should receive 2 doses or a second dose if she has previously received 1 dose. Pregnant females who do not have evidence of immunity should receive the first dose after pregnancy. This first dose should be obtained before leaving the health care facility. The second dose should be obtained 4-8 weeks after the first dose.  Human papillomavirus (HPV) vaccine. Females aged 13-26 years who have not received the vaccine previously should obtain the 3-dose series. The vaccine is not recommended for use in pregnant females. However, pregnancy testing is not needed before receiving a dose. If a female is found to be pregnant after receiving a dose, no treatment is needed. In that case, the remaining doses should be delayed until after the pregnancy. Immunization is recommended for any person with an immunocompromised condition through the age of 26 years if she did not get any or all doses earlier. During the 3-dose series, the second dose should be obtained 4-8 weeks after the first dose. The third dose should be obtained 24 weeks after the first dose and 16 weeks after the second dose.  Zoster vaccine. One dose is recommended for adults aged 60 years or older unless certain conditions are  present.  Measles, mumps, and rubella (MMR) vaccine. Adults born before 1957 generally are considered immune to measles and mumps. Adults born in 1957 or later should have 1 or more doses of MMR vaccine unless there is a contraindication to the vaccine or there is laboratory evidence of immunity to   each of the three diseases. A routine second dose of MMR vaccine should be obtained at least 28 days after the first dose for students attending postsecondary schools, health care workers, or international travelers. People who received inactivated measles vaccine or an unknown type of measles vaccine during 1963-1967 should receive 2 doses of MMR vaccine. People who received inactivated mumps vaccine or an unknown type of mumps vaccine before 1979 and are at high risk for mumps infection should consider immunization with 2 doses of MMR vaccine. For females of childbearing age, rubella immunity should be determined. If there is no evidence of immunity, females who are not pregnant should be vaccinated. If there is no evidence of immunity, females who are pregnant should delay immunization until after pregnancy. Unvaccinated health care workers born before 1957 who lack laboratory evidence of measles, mumps, or rubella immunity or laboratory confirmation of disease should consider measles and mumps immunization with 2 doses of MMR vaccine or rubella immunization with 1 dose of MMR vaccine.  Pneumococcal 13-valent conjugate (PCV13) vaccine. When indicated, a person who is uncertain of her immunization history and has no record of immunization should receive the PCV13 vaccine. An adult aged 19 years or older who has certain medical conditions and has not been previously immunized should receive 1 dose of PCV13 vaccine. This PCV13 should be followed with a dose of pneumococcal polysaccharide (PPSV23) vaccine. The PPSV23 vaccine dose should be obtained at least 8 weeks after the dose of PCV13 vaccine. An adult aged 19  years or older who has certain medical conditions and previously received 1 or more doses of PPSV23 vaccine should receive 1 dose of PCV13. The PCV13 vaccine dose should be obtained 1 or more years after the last PPSV23 vaccine dose.  Pneumococcal polysaccharide (PPSV23) vaccine. When PCV13 is also indicated, PCV13 should be obtained first. All adults aged 65 years and older should be immunized. An adult younger than age 65 years who has certain medical conditions should be immunized. Any person who resides in a nursing home or long-term care facility should be immunized. An adult smoker should be immunized. People with an immunocompromised condition and certain other conditions should receive both PCV13 and PPSV23 vaccines. People with human immunodeficiency virus (HIV) infection should be immunized as soon as possible after diagnosis. Immunization during chemotherapy or radiation therapy should be avoided. Routine use of PPSV23 vaccine is not recommended for American Indians, [place] Natives, or people younger than 65 years unless there are medical conditions that require PPSV23 vaccine. When indicated, people who have unknown immunization and have no record of immunization should receive PPSV23 vaccine. One-time revaccination 5 years after the first dose of PPSV23 is recommended for people aged 19-64 years who have chronic kidney failure, nephrotic syndrome, asplenia, or immunocompromised conditions. People who received 1-2 doses of PPSV23 before age 65 years should receive another dose of PPSV23 vaccine at age 65 years or later if at least 5 years have passed since the previous dose. Doses of PPSV23 are not needed for people immunized with PPSV23 at or after age 65 years.  Meningococcal vaccine. Adults with asplenia or persistent complement component deficiencies should receive 2 doses of quadrivalent meningococcal conjugate (MenACWY-D) vaccine. The doses should be obtained at least 2 months apart.  Microbiologists working with certain meningococcal bacteria, military recruits, people at risk during an outbreak, and people who travel to or live in countries with a high rate of meningitis should be immunized. A first-year college student up through age   21 years who is living in a residence hall should receive a dose if she did not receive a dose on or after her 16th birthday. Adults who have certain high-risk conditions should receive one or more doses of vaccine.  Hepatitis A vaccine. Adults who wish to be protected from this disease, have certain high-risk conditions, work with hepatitis A-infected animals, work in hepatitis A research labs, or travel to or work in countries with a high rate of hepatitis A should be immunized. Adults who were previously unvaccinated and who anticipate close contact with an international adoptee during the first 60 days after arrival in the [place] from a country with a high rate of hepatitis A should be immunized.  Hepatitis B vaccine. Adults who wish to be protected from this disease, have certain high-risk conditions, may be exposed to blood or other infectious body fluids, are household contacts or sex partners of hepatitis B positive people, are clients or workers in certain care facilities, or travel to or work in countries with a high rate of hepatitis B should be immunized.  Haemophilus influenzae type b (Hib) vaccine. A previously unvaccinated person with asplenia or sickle cell disease or having a scheduled splenectomy should receive 1 dose of Hib vaccine. Regardless of previous immunization, a recipient of a hematopoietic stem cell transplant should receive a 3-dose series 6-12 months after her successful transplant. Hib vaccine is not recommended for adults with HIV infection. Preventive Services / Frequency Ages 64 to 68 years  Blood pressure check.** / Every 1 to 2 years.  Lipid and cholesterol check.** / Every 5 years beginning at age  22.  Clinical breast exam.** / Every 3 years for women in their 88s and 53s.  BRCA-related cancer risk assessment.** / For women who have family members with a BRCA-related cancer (breast, ovarian, tubal, or peritoneal cancers).  Pap test.** / Every 2 years from ages 90 through 51. Every 3 years starting at age 21 through age 56 or 3 with a history of 3 consecutive normal Pap tests.  HPV screening.** / Every 3 years from ages 24 through ages 1 to 46 with a history of 3 consecutive normal Pap tests.  Hepatitis C blood test.** / For any individual with known risks for hepatitis C.  Skin self-exam. / Monthly.  Influenza vaccine. / Every year.  Tetanus, diphtheria, and acellular pertussis (Tdap, Td) vaccine.** / Consult your health care provider. Pregnant women should receive 1 dose of Tdap vaccine during each pregnancy. 1 dose of Td every 10 years.  Varicella vaccine.** / Consult your health care provider. Pregnant females who do not have evidence of immunity should receive the first dose after pregnancy.  HPV vaccine. / 3 doses over 6 months, if 72 and younger. The vaccine is not recommended for use in pregnant females. However, pregnancy testing is not needed before receiving a dose.  Measles, mumps, rubella (MMR) vaccine.** / You need at least 1 dose of MMR if you were born in 1957 or later. You may also need a 2nd dose. For females of childbearing age, rubella immunity should be determined. If there is no evidence of immunity, females who are not pregnant should be vaccinated. If there is no evidence of immunity, females who are pregnant should delay immunization until after pregnancy.  Pneumococcal 13-valent conjugate (PCV13) vaccine.** / Consult your health care provider.  Pneumococcal polysaccharide (PPSV23) vaccine.** / 1 to 2 doses if you smoke cigarettes or if you have certain conditions.  Meningococcal vaccine.** /  1 dose if you are age 19 to 21 years and a first-year college  student living in a residence hall, or have one of several medical conditions, you need to get vaccinated against meningococcal disease. You may also need additional booster doses.  Hepatitis A vaccine.** / Consult your health care provider.  Hepatitis B vaccine.** / Consult your health care provider.  Haemophilus influenzae type b (Hib) vaccine.** / Consult your health care provider. Ages 40 to 64 years  Blood pressure check.** / Every 1 to 2 years.  Lipid and cholesterol check.** / Every 5 years beginning at age 20 years.  Lung cancer screening. / Every year if you are aged 55-80 years and have a 30-pack-year history of smoking and currently smoke or have quit within the past 15 years. Yearly screening is stopped once you have quit smoking for at least 15 years or develop a health problem that would prevent you from having lung cancer treatment.  Clinical breast exam.** / Every year after age 40 years.  BRCA-related cancer risk assessment.** / For women who have family members with a BRCA-related cancer (breast, ovarian, tubal, or peritoneal cancers).  Mammogram.** / Every year beginning at age 40 years and continuing for as long as you are in good health. Consult with your health care provider.  Pap test.** / Every 3 years starting at age 30 years through age 65 or 70 years with a history of 3 consecutive normal Pap tests.  HPV screening.** / Every 3 years from ages 30 years through ages 65 to 70 years with a history of 3 consecutive normal Pap tests.  Fecal occult blood test (FOBT) of stool. / Every year beginning at age 50 years and continuing until age 75 years. You may not need to do this test if you get a colonoscopy every 10 years.  Flexible sigmoidoscopy or colonoscopy.** / Every 5 years for a flexible sigmoidoscopy or every 10 years for a colonoscopy beginning at age 50 years and continuing until age 75 years.  Hepatitis C blood test.** / For all people born from 1945 through  1965 and any individual with known risks for hepatitis C.  Skin self-exam. / Monthly.  Influenza vaccine. / Every year.  Tetanus, diphtheria, and acellular pertussis (Tdap/Td) vaccine.** / Consult your health care provider. Pregnant women should receive 1 dose of Tdap vaccine during each pregnancy. 1 dose of Td every 10 years.  Varicella vaccine.** / Consult your health care provider. Pregnant females who do not have evidence of immunity should receive the first dose after pregnancy.  Zoster vaccine.** / 1 dose for adults aged 60 years or older.  Measles, mumps, rubella (MMR) vaccine.** / You need at least 1 dose of MMR if you were born in 1957 or later. You may also need a 2nd dose. For females of childbearing age, rubella immunity should be determined. If there is no evidence of immunity, females who are not pregnant should be vaccinated. If there is no evidence of immunity, females who are pregnant should delay immunization until after pregnancy.  Pneumococcal 13-valent conjugate (PCV13) vaccine.** / Consult your health care provider.  Pneumococcal polysaccharide (PPSV23) vaccine.** / 1 to 2 doses if you smoke cigarettes or if you have certain conditions.  Meningococcal vaccine.** / Consult your health care provider.  Hepatitis A vaccine.** / Consult your health care provider.  Hepatitis B vaccine.** / Consult your health care provider.  Haemophilus influenzae type b (Hib) vaccine.** / Consult your health care provider. Ages 65   years and over  Blood pressure check.** / Every 1 to 2 years.  Lipid and cholesterol check.** / Every 5 years beginning at age 22 years.  Lung cancer screening. / Every year if you are aged 73-80 years and have a 30-pack-year history of smoking and currently smoke or have quit within the past 15 years. Yearly screening is stopped once you have quit smoking for at least 15 years or develop a health problem that would prevent you from having lung cancer  treatment.  Clinical breast exam.** / Every year after age 4 years.  BRCA-related cancer risk assessment.** / For women who have family members with a BRCA-related cancer (breast, ovarian, tubal, or peritoneal cancers).  Mammogram.** / Every year beginning at age 40 years and continuing for as long as you are in good health. Consult with your health care provider.  Pap test.** / Every 3 years starting at age 9 years through age 34 or 91 years with 3 consecutive normal Pap tests. Testing can be stopped between 65 and 70 years with 3 consecutive normal Pap tests and no abnormal Pap or HPV tests in the past 10 years.  HPV screening.** / Every 3 years from ages 57 years through ages 64 or 45 years with a history of 3 consecutive normal Pap tests. Testing can be stopped between 65 and 70 years with 3 consecutive normal Pap tests and no abnormal Pap or HPV tests in the past 10 years.  Fecal occult blood test (FOBT) of stool. / Every year beginning at age 15 years and continuing until age 17 years. You may not need to do this test if you get a colonoscopy every 10 years.  Flexible sigmoidoscopy or colonoscopy.** / Every 5 years for a flexible sigmoidoscopy or every 10 years for a colonoscopy beginning at age 86 years and continuing until age 71 years.  Hepatitis C blood test.** / For all people born from [birth year] through 1963 and any individual with known risks for hepatitis C.  Osteoporosis screening.** / A one-time screening for women ages 83 years and over and women at risk for fractures or osteoporosis.  Skin self-exam. / Monthly.  Influenza vaccine. / Every year.  Tetanus, diphtheria, and acellular pertussis (Tdap/Td) vaccine.** / 1 dose of Td every 10 years.  Varicella vaccine.** / Consult your health care provider.  Zoster vaccine.** / 1 dose for adults aged 61 years or older.  Pneumococcal 13-valent conjugate (PCV13) vaccine.** / Consult your health care provider.  Pneumococcal  polysaccharide (PPSV23) vaccine.** / 1 dose for all adults aged 28 years and older.  Meningococcal vaccine.** / Consult your health care provider.  Hepatitis A vaccine.** / Consult your health care provider.  Hepatitis B vaccine.** / Consult your health care provider.  Haemophilus influenzae type b (Hib) vaccine.** / Consult your health care provider. ** Family history and personal history of risk and conditions may change your health care provider's recommendations. Document Released: 11/12/2001 Document Revised: 01/31/2014 Document Reviewed: 02/11/2011 [facility] Patient Information 2015 [place], [place]. This information is not intended to replace advice given to you by your health care provider. Make sure you discuss any questions you have with your health care provider.

## 2015-05-11 NOTE — Progress Notes (Signed)
Pre visit review using our clinic review tool, if applicable. No additional management support is needed unless otherwise documented below in the visit note. 

## 2015-05-14 DIAGNOSIS — J302 Other seasonal allergic rhinitis: Secondary | ICD-10-CM | POA: Insufficient documentation

## 2015-05-14 HISTORY — DX: Other seasonal allergic rhinitis: J30.2

## 2015-05-14 LAB — VITAMIN D 1,25 DIHYDROXY
Vitamin D 1, 25 (OH)2 Total: 59 pg/mL (ref 18–72)
Vitamin D2 1, 25 (OH)2: <8 pg/mL
Vitamin D3 1, 25 (OH)2: 59 pg/mL

## 2015-05-14 NOTE — Assessment & Plan Note (Signed)
con't antihistamine and steroid nasal spray Probably the cause of the ear sensitivity and headaches rto prn

## 2015-06-06 ENCOUNTER — Telehealth: Payer: Self-pay | Admitting: Family Medicine

## 2015-06-06 DIAGNOSIS — E049 Nontoxic goiter, unspecified: Secondary | ICD-10-CM

## 2015-06-06 NOTE — Telephone Encounter (Signed)
Hx of Goiter. Please advise     KP

## 2015-06-06 NOTE — Telephone Encounter (Signed)
Relation to WU:[id number] Call back number:[phone]   Reason for call:  Patient requesting a referral to [facility]: Bernadene Person MD [address] [place], [place], [place] [address] Phone: [phone]. Patient is unhappy with initial referral. Please advise

## 2015-06-06 NOTE — Telephone Encounter (Signed)
Ok to refer.

## 2015-06-07 NOTE — Telephone Encounter (Signed)
Ref has been placed.     KP 

## 2015-06-15 ENCOUNTER — Ambulatory Visit (INDEPENDENT_AMBULATORY_CARE_PROVIDER_SITE_OTHER): Payer: Medicare Other | Admitting: Endocrinology

## 2015-06-15 ENCOUNTER — Encounter: Payer: Self-pay | Admitting: Endocrinology

## 2015-06-15 VITALS — BP 130/82 | HR 84 | Temp 98.3°F | Resp 16 | Ht 62.0 in | Wt 197.8 lb

## 2015-06-15 DIAGNOSIS — E041 Nontoxic single thyroid nodule: Secondary | ICD-10-CM | POA: Diagnosis not present

## 2015-06-15 DIAGNOSIS — R635 Abnormal weight gain: Secondary | ICD-10-CM

## 2015-06-15 NOTE — Progress Notes (Signed)
Patient ID: [name], female   DOB: Jun 09, 1956, 59 y.o.   MRN: [id number]    Reason for Appointment:  Thyroid nodule and other problems    History of Present Illness:   THYROID nodule: The patient's thyroid enlargement was first discovered in 2008 when she was being evaluated for cervical lymph nodes  Thyroid biopsy in 2009 of a 1.4 cm complex dominant  Right nodule showed a colloid nodule She has had a stable thyroid nodule on ultrasound exams previously  Last ultrasound in 2012 reported the following:  Focal nodules: 13 x 13 x 20 mm hypoechoic solid, deep mid right (previously 11 x 14 x 20)  7 x 7 x 8 mm complex mostly solid, inferior right  There are at least five additional small nodules bilaterally, none measuring greater than 5 mm maximum diameter  She has had no difficulty with swallowing  Does not feel like she has any choking sensation in her neck or pressure in any position   She is concerned that her nodule has not been checked recently   She has not been treated with thyroid suppression   her thyroid levels have been consistently normal  Lab Results  Component Value Date   TSH 1.63 05/11/2015   TSH 0.49 03/16/2014   TSH 0.37 02/25/2014   FREET4 0.73 02/25/2014   FREET4 0.9 01/25/2008        Medication List       This list is accurate as of: 06/15/15  9:34 PM.  Always use your most recent med list.               Biotin 5000 MCG Tabs  Take 1 tablet by mouth daily.     calcium carbonate 200 MG capsule  Take 250 mg by mouth 2 (two) times daily with a meal.     fluticasone 50 MCG/ACT nasal spray  Commonly known as:  FLONASE  Place 2 sprays into both nostrils daily.     multivitamin-iron-minerals-folic acid chewable tablet  Chew 1 tablet by mouth daily.     NEXIUM 40 MG capsule  Generic drug:  esomeprazole  TAKE 1 CAPSULE BY MOUTH DAILY BEFORE BREAKFAST.     ONE TOUCH ULTRA TEST test strip  Generic drug:  glucose blood  USE  ONCE DAILY     POTASSIUM CHLORIDE CR PO  Take 595 mg by mouth.     predniSONE 5 MG tablet  Commonly known as:  DELTASONE  Take 5 mg by mouth as needed.     pyridoxine 100 MG tablet  Commonly known as:  B-6  Take 100 mg by mouth daily.     TYLENOL EXTRA STRENGTH PO  Take by mouth.        Allergies:  Allergies  Allergen Reactions  . Aspirin     REACTION: intolerance-gi upset  . Codeine   . Morphine   . Sulfonamide Derivatives     Past Medical History  Diagnosis Date  . GERD (gastroesophageal reflux disease)   . Hypertension   . Arthritis   . Gastritis   . Hiatal hernia   . Ankylosing spondylitis of multiple sites in spine   . Thyroid nodule     Past Surgical History  Procedure Laterality Date  . Esophagogastroduodenoscopy    . Breast reduction surgery  1996  . Total hip arthroplasty      x 2 left, one revision  . Bariatric surgery    . Pelvis replacement  Family History  Problem Relation Age of Onset  . Melanoma Maternal Grandmother   . Breast cancer Maternal Grandmother     and Aunt  . Arthritis Father   . Hypertension Mother   . Hypertension Father   . Hypertension Sister   . Diabetes Father   . Diabetes Sister     Social History:  reports that she has never smoked. She does not have any smokeless tobacco history on file. She reports that she does not drink alcohol or use illicit drugs.   Review of Systems  She is complaining about progressive weight gain since about  2011  She thinks she has gained  More weight but it appears to be about the same as last year.  Notably she has had a history of gastric bypass surgery in 2006 and her weight in 2009 was 158 pounds Not consistent with diet  And did not want to see dietitian.   She has been told to start exercising but previously had not done so because of fatigue.   She said that she can do some biking but has not been allowed by her or to be surgeon to do much walking or other weightbearing  exercises.  Wt Readings from Last 3 Encounters:  06/15/15 197 lb 12.8 oz (89.721 kg)  05/11/15 195 lb 12.8 oz (88.814 kg)  03/16/14 192 lb 6.4 oz (87.272 kg)       Also has some history of  Low motivation level and has not had any discussion about this with her PCP    She has a normal vitamin D level         Examination:   BP 130/82 mmHg  Pulse 84  Temp(Src) 98.3 F (36.8 C)  Resp 16  Ht  (1.575 m)  Wt 197 lb 12.8 oz (89.721 kg)  BMI 36.17 kg/m2  SpO2 97%          Neck: The thyroid is palpable with a nodule about 1.5-2 cm on the right on swallowing , smooth and slightly firm. Left side is not palpable    Assessment/Plan:  1. Multinodular goiter with a small stable  Right-sided nodule since at least 2009 She has had a previous biopsy  which showed colloid nodule Discussed with the patient that since clinically the thyroid nodule is stable and also had not changed in size between 2009 and 2012 no further ultrasound exams are needed  Thyroid function is normal  She will follow-up in one year   2. Weight gain. She is not exercising and though she is eating small portions she can benefit from improving the type of foods that she is eating    She needs to discuss possible depression with PCP also    [facility] 06/15/2015

## 2015-07-04 ENCOUNTER — Telehealth: Payer: Self-pay | Admitting: Family Medicine

## 2015-07-04 NOTE — Telephone Encounter (Signed)
Pt calling about $50 bill for no show 05/01/15. She said that she needs you to check with Dr. Laury Axon. I advised pt that the charge was approved by Dr. Laury Axon. She said that she needs a call.

## 2015-07-11 DIAGNOSIS — Z96642 Presence of left artificial hip joint: Secondary | ICD-10-CM | POA: Diagnosis not present

## 2015-07-11 DIAGNOSIS — M25552 Pain in left hip: Secondary | ICD-10-CM | POA: Diagnosis not present

## 2015-07-11 DIAGNOSIS — Z471 Aftercare following joint replacement surgery: Secondary | ICD-10-CM | POA: Diagnosis not present

## 2015-08-07 DIAGNOSIS — Z01419 Encounter for gynecological examination (general) (routine) without abnormal findings: Secondary | ICD-10-CM | POA: Diagnosis not present

## 2015-08-07 DIAGNOSIS — E041 Nontoxic single thyroid nodule: Secondary | ICD-10-CM | POA: Diagnosis not present

## 2015-08-07 DIAGNOSIS — Z1231 Encounter for screening mammogram for malignant neoplasm of breast: Secondary | ICD-10-CM | POA: Diagnosis not present

## 2015-08-07 DIAGNOSIS — Z6836 Body mass index (BMI) 36.0-36.9, adult: Secondary | ICD-10-CM | POA: Diagnosis not present

## 2015-08-25 ENCOUNTER — Institutional Professional Consult (permissible substitution): Payer: Medicare Other | Admitting: Internal Medicine

## 2015-10-06 ENCOUNTER — Ambulatory Visit (INDEPENDENT_AMBULATORY_CARE_PROVIDER_SITE_OTHER): Payer: Medicare Other | Admitting: Gastroenterology

## 2015-10-06 ENCOUNTER — Encounter: Payer: Self-pay | Admitting: Gastroenterology

## 2015-10-06 VITALS — BP 138/88 | HR 74 | Ht 62.0 in | Wt 201.0 lb

## 2015-10-06 DIAGNOSIS — Z9889 Other specified postprocedural states: Secondary | ICD-10-CM

## 2015-10-06 DIAGNOSIS — Z9884 Bariatric surgery status: Secondary | ICD-10-CM

## 2015-10-06 DIAGNOSIS — K219 Gastro-esophageal reflux disease without esophagitis: Secondary | ICD-10-CM | POA: Diagnosis not present

## 2015-10-06 MED ORDER — ESOMEPRAZOLE MAGNESIUM 40 MG PO CPDR: 40.0000 mg | DELAYED_RELEASE_CAPSULE | Freq: Every day | ORAL | Status: DC

## 2015-10-06 NOTE — Progress Notes (Signed)
HPI :  59 y/o female here for follow up. Former patient of Dr. Arlyce Dice, new to me. She has a history of ankylosing spondylitis and GERD. She is s/p Roux-en-Y gastric bypass.  Patient here for today for history of reflux. She reports she has had intermittent flare ups of symptoms which causes her chest discomfort. This has occurred more often in the past few weeks. She endorses a fullness in her chest when this occurs. She has seen a cardiologist for this issue. She had a low risk nuclear stress test in 2014 which was negative when done for these symptoms. She does endorse some pyrosis and regurgittion when she eats too much. She reports it occurs randomly and without prediction, she doesn't necessarily notice a change after meals but has symptoms at night, can bother her when she lies down. She takes 40mg  Nexium, but takes it only PRN. She can take it up to BID when she has symptoms. She reports the nexium will help her symptoms significantly when she takes it. She had gastric bypass in 2006, Roux-en-Y. She does not crack the capsule prior to taking nexium. No odynophagia. No dysphagia. No abdominal pains. No exertional symptoms. She denies NSAID use.   Endoscopic history Colonoscopy 2011 was normal.  Endoscopy 2006 demonstrated a 3 cm hiatal hernia and chronic gastritis.     Past Medical History  Diagnosis Date  . GERD (gastroesophageal reflux disease)   . Hypertension   . Arthritis   . Gastritis   . Hiatal hernia   . Ankylosing spondylitis of multiple sites in spine (HCC)   . Thyroid nodule      Past Surgical History  Procedure Laterality Date  . Esophagogastroduodenoscopy    . Breast reduction surgery  1996  . Total hip arthroplasty      x 2 left, one revision  . Bariatric surgery    . Pelvis replacement     Family History  Problem Relation Age of Onset  . Melanoma Maternal Grandmother   . Breast cancer Maternal Grandmother     and Aunt  . Arthritis Father   . Hypertension  Mother   . Hypertension Father   . Hypertension Sister   . Diabetes Father   . Diabetes Sister    Social History  Substance Use Topics  . Smoking status: Never Smoker   . Smokeless tobacco: Never Used  . Alcohol Use: No   Current Outpatient Prescriptions  Medication Sig Dispense Refill  . Acetaminophen (TYLENOL EXTRA STRENGTH PO) Take by mouth.      . Biotin 5000 MCG TABS Take 1 tablet by mouth daily.    . calcium carbonate 200 MG capsule Take 250 mg by mouth 2 (two) times daily with a meal.      . multivitamin-iron-minerals-folic acid (CENTRUM) chewable tablet Chew 1 tablet by mouth daily.    Marland Kitchen NEXIUM 40 MG capsule TAKE 1 CAPSULE BY MOUTH DAILY BEFORE BREAKFAST. 30 capsule 5  . ONE TOUCH ULTRA TEST test strip USE ONCE DAILY 50 each 10  . POTASSIUM CHLORIDE CR PO Take 595 mg by mouth.     . predniSONE (DELTASONE) 5 MG tablet Take 5 mg by mouth as needed.    . pyridoxine (B-6) 100 MG tablet Take 100 mg by mouth daily.     No current facility-administered medications for this visit.   Allergies  Allergen Reactions  . Aspirin     REACTION: intolerance-gi upset  . Codeine   . Morphine   .  Sulfonamide Derivatives      Review of Systems: All systems reviewed and negative except where noted in HPI.   Lab Results  Component Value Date   WBC 4.1 05/11/2015   HGB 13.5 05/11/2015   HCT 41.4 05/11/2015   MCV 97.4 05/11/2015   PLT 328.0 05/11/2015    Lab Results  Component Value Date   CREATININE 0.53 05/11/2015   BUN 10 05/11/2015   NA 140 05/11/2015   K 3.7 05/11/2015   CL 102 05/11/2015   CO2 28 05/11/2015    Lab Results  Component Value Date   ALT 21 05/11/2015   AST 27 05/11/2015   ALKPHOS 74 05/11/2015   BILITOT 0.7 05/11/2015     Physical Exam: BP 138/88 mmHg  Pulse 74  Ht 5\' 2"  (1.575 m)  Wt 201 lb (91.173 kg)  BMI 36.75 kg/m2 Constitutional: Pleasant,well-developed, female in no acute distress. HEENT: Normocephalic and atraumatic. Conjunctivae are  normal. No scleral icterus. Neck supple.  Cardiovascular: Normal rate, regular rhythm.  Pulmonary/chest: Effort normal and breath sounds normal. No wheezing, rales or rhonchi. Abdominal: Soft, nondistended, nontender. Bowel sounds active throughout. There are no masses palpable. No hepatomegaly. Extremities: no edema Lymphadenopathy: No cervical adenopathy noted. Neurological: Alert and oriented to person place and time. Skin: Skin is warm and dry. No rashes noted. Psychiatric: Normal mood and affect. Behavior is normal.   ASSESSMENT AND PLAN: 60 y/o female with history of Roux-en-Y gastric bypass, here for evaluation for reflux symptoms / chest discomfort. Her symptoms are reliably relieved with nexium, but she does not take it routinely. She has had a low risk nuclear stress test to further evaluate these symptoms in the past. GERD is very likely driving her symptoms. Given her gastric bypass status, recommend she crack her nexium capsules open and mix with food when she takes it to improve absorption. The risks of long term PPIs with current data include increased risk for chronic kidney disease, increased risk of fracture, increased risk of C diff, increased risk of pneumonia (short term usage), potentially increased risk of dementia, potentially increased risk of B12 / calcium deficiency, and rare risk of hypomagnesemia. Patient was counseled to use the lowest daily use of PPI needed to control symptoms. Renal function is currently normal and would recommend checking this yearly while on PPI therapy. She can consider a trial of Zantac moving forward as well, however given her active symptoms will continue lowest dose of PPI needed at present time. Otherwise, in light of her gastric bypass history, she is regaining weight and asks about repeat EGD to assess if the gastric pouch has enlarged. I don't see an indication for an EGD at this time, however I recommend she see her bariatric surgeon, which  she is scheduled to do in a few weeks, and ask him about this, to determine if EGD is warranted if they were to consider a revision. She agreed and can follow up PRN.   Ileene PatrickSteven Dquan Cortopassi, MD [facility] Pager [phone]

## 2015-10-25 ENCOUNTER — Telehealth: Payer: Self-pay | Admitting: Family Medicine

## 2015-10-31 DIAGNOSIS — L669 Cicatricial alopecia, unspecified: Secondary | ICD-10-CM | POA: Diagnosis not present

## 2015-10-31 DIAGNOSIS — L658 Other specified nonscarring hair loss: Secondary | ICD-10-CM | POA: Diagnosis not present

## 2015-11-01 NOTE — Telephone Encounter (Signed)
.  mychart

## 2015-11-03 ENCOUNTER — Encounter: Payer: Self-pay | Admitting: Gastroenterology

## 2015-11-20 ENCOUNTER — Encounter: Payer: Self-pay | Admitting: *Deleted

## 2015-11-20 ENCOUNTER — Ambulatory Visit: Payer: Medicare Other | Admitting: Endocrinology

## 2015-11-20 ENCOUNTER — Telehealth: Payer: Self-pay | Admitting: Endocrinology

## 2015-11-20 NOTE — Telephone Encounter (Signed)
Letter mailed

## 2015-11-20 NOTE — Telephone Encounter (Signed)
Patient no showed today's appt. Please advise on how to follow up. °A. No follow up necessary. °B. Follow up urgent. Contact patient immediately. °C. Follow up necessary. Contact patient and schedule visit in ___ days. °D. Follow up advised. Contact patient and schedule visit in ____weeks. ° °

## 2015-12-08 ENCOUNTER — Encounter: Payer: Self-pay | Admitting: Pulmonary Disease

## 2015-12-08 ENCOUNTER — Ambulatory Visit (INDEPENDENT_AMBULATORY_CARE_PROVIDER_SITE_OTHER): Payer: Medicare Other | Admitting: Pulmonary Disease

## 2015-12-08 VITALS — BP 124/82 | HR 72 | Ht 61.0 in | Wt 200.0 lb

## 2015-12-08 DIAGNOSIS — R0683 Snoring: Secondary | ICD-10-CM | POA: Diagnosis not present

## 2015-12-08 DIAGNOSIS — Z23 Encounter for immunization: Secondary | ICD-10-CM | POA: Diagnosis not present

## 2015-12-08 NOTE — Progress Notes (Signed)
Past Medical History She  has a past medical history of GERD (gastroesophageal reflux disease); Hypertension; Arthritis; Gastritis; Hiatal hernia; Ankylosing spondylitis of multiple sites in spine [facility](HCC); and Thyroid nodule.  Past Surgical History She  has past surgical history that includes Esophagogastroduodenoscopy; Breast reduction surgery (1996); Total hip arthroplasty; Bariatric Surgery; and pelvis replacement.  Current Outpatient Prescriptions on File Prior to Visit  Medication Sig  . Acetaminophen (TYLENOL EXTRA STRENGTH PO) Take by mouth.    . Biotin 5000 MCG TABS Take 1 tablet by mouth daily.  . calcium carbonate 200 MG capsule Take 250 mg by mouth 2 (two) times daily with a meal.    . esomeprazole (NEXIUM) 40 MG capsule Take 1 capsule (40 mg total) by mouth daily. (Patient taking differently: Take 40 mg by mouth daily as needed. )  . multivitamin-iron-minerals-folic acid (CENTRUM) chewable tablet Chew 1 tablet by mouth daily.  . ONE TOUCH ULTRA TEST test strip USE ONCE DAILY  . POTASSIUM CHLORIDE CR PO Take 595 mg by mouth.   . predniSONE (DELTASONE) 5 MG tablet Take 5 mg by mouth as needed.  . pyridoxine (B-6) 100 MG tablet Take 100 mg by mouth daily.   No current facility-administered medications on file prior to visit.    Allergies  Allergen Reactions  . Aspirin     REACTION: intolerance-gi upset  . Codeine   . Morphine   . Sulfonamide Derivatives     Family History Her family history includes Arthritis in her father; Breast cancer in her maternal grandmother; Diabetes in her father and sister; Hypertension in her father, mother, and sister; Melanoma in her maternal grandmother.  Social History She  reports that she has never smoked. She has never used smokeless tobacco. She reports that she does not drink alcohol or use illicit drugs.  Review of systems Constitutional: Positive for unexpected weight change. Negative for fever.  HENT: Negative for congestion, dental  problem, ear pain, nosebleeds, postnasal drip, rhinorrhea, sinus pressure, sneezing, sore throat and trouble swallowing.   Eyes: Negative for redness and itching.  Respiratory: Positive for shortness of breath. Negative for cough, chest tightness and wheezing.   Cardiovascular: Positive for chest pain and palpitations ( irregular). Negative for leg swelling.  Gastrointestinal: Negative for nausea and vomiting.       Acid heartburn  Genitourinary: Negative for dysuria.  Musculoskeletal: Positive for joint swelling.  Skin: Negative for rash.  Neurological: Positive for headaches.  Hematological: Does not bruise/bleed easily.  Psychiatric/Behavioral: Negative for dysphoric mood. The patient is not nervous/anxious.    Chief Complaint  Patient presents with  . SLEEP CONSULT    Referred by Candice Campavid Lowe for snoring/restlessness and night sweats. Epworth Score: 15     Vital signs BP 124/82 mmHg  Pulse 72  Ht 5\' 1"  (1.549 m)  Wt 200 lb (90.719 kg)  BMI 37.81 kg/m2  SpO2 98%  History of Present Illness [name] is a 60 y.o. female for evaluation of sleep problems.  She has noticed trouble with her sleep for years.  Her husband says her snoring has gotten to be unbearable, especially since she started to regain weight.  She will stop breathing while asleep and her mouth gets dry.    She goes to sleep at 10 pm.  She falls asleep within minutes.  She wakes up a couple times to use the bathroom.  She gets out of bed at 530 am.  She feels tired in the morning.  She denies morning  headache.  She takes tylenol arthritis at nigh to help with pain and this helps her sleep.  She drinks coffee and energy teas during the day.  She denies sleep walking, sleep talking, bruxism, or nightmares.  There is no history of restless legs.  She denies sleep hallucinations, sleep paralysis, or cataplexy.  The Epworth score is 15 out of 24.   Physical Exam:  General - No distress ENT - No sinus  tenderness, no oral exudate, no LAN, no thyromegaly, TM clear, pupils equal/reactive, MP 4, enlarged tongue Cardiac - s1s2 regular, no murmur, pulses symmetric Chest - No wheeze/rales/dullness, good air entry, normal respiratory excursion Back - No focal tenderness Abd - Soft, non-tender, no organomegaly, + bowel sounds Ext - No edema Neuro - Normal strength, cranial nerves intact Skin - No rashes Psych - Normal mood, and behavior  Discussion: She has snoring, sleep disruption, witnessed apnea, and daytime sleepiness.  She has a history of hypertension.  I am concerned she could have sleep apnea.  We discussed how sleep apnea can affect various health problems, including risks for hypertension, cardiovascular disease, and diabetes.  We also discussed how sleep disruption can increase risks for accidents, such as while driving.  Weight loss as a means of improving sleep apnea was also reviewed.  Additional treatment options discussed were CPAP therapy, oral appliance, and surgical intervention.  Assessment/plan:  Snoring. Plan: - will arrange for in lab sleep study >> explained that her insurance might not cover in lab study, and then she might need to do home sleep study  Obesity. Plan: - discussed options to assist with weight loss  Ankylosing spondylitis. Plan: - she is to f/u with Dr. Kellie Simmering with rheumatology to discuss plan for prednisone   Patient Instructions  Will arrange for in lab sleep study Will call to arrange for follow up after sleep study reviewed      Coralyn Helling, M.D. Pager [phone] 12/08/2015, 2:28 PM

## 2015-12-08 NOTE — Patient Instructions (Signed)
Will arrange for in lab sleep study Will call to arrange for follow up after sleep study reviewed  

## 2015-12-08 NOTE — Progress Notes (Signed)
   Subjective:    Patient ID: [name], female    DOB: 05/03/1956, 60 y.o.   MRN: [id number]  HPI    Review of Systems  Constitutional: Positive for unexpected weight change. Negative for fever.  HENT: Negative for congestion, dental problem, ear pain, nosebleeds, postnasal drip, rhinorrhea, sinus pressure, sneezing, sore throat and trouble swallowing.   Eyes: Negative for redness and itching.  Respiratory: Positive for shortness of breath. Negative for cough, chest tightness and wheezing.   Cardiovascular: Positive for chest pain and palpitations ( irregular). Negative for leg swelling.  Gastrointestinal: Negative for nausea and vomiting.       Acid heartburn  Genitourinary: Negative for dysuria.  Musculoskeletal: Positive for joint swelling.  Skin: Negative for rash.  Neurological: Positive for headaches.  Hematological: Does not bruise/bleed easily.  Psychiatric/Behavioral: Negative for dysphoric mood. The patient is not nervous/anxious.        Objective:   Physical Exam        Assessment & Plan:

## 2016-02-12 ENCOUNTER — Ambulatory Visit (HOSPITAL_BASED_OUTPATIENT_CLINIC_OR_DEPARTMENT_OTHER): Payer: Medicare Other

## 2016-02-28 DIAGNOSIS — R635 Abnormal weight gain: Secondary | ICD-10-CM | POA: Diagnosis not present

## 2016-03-04 ENCOUNTER — Telehealth: Payer: Self-pay | Admitting: Behavioral Health

## 2016-03-04 NOTE — Telephone Encounter (Signed)
Pt. confirmed appointment for Medicare Wellness Visit with Bridgett LarssonAshlee, RN on 03/05/16 at 9:30 AM.

## 2016-03-05 ENCOUNTER — Ambulatory Visit (INDEPENDENT_AMBULATORY_CARE_PROVIDER_SITE_OTHER): Payer: Medicare Other

## 2016-03-05 VITALS — BP 138/82 | HR 72 | Ht 62.0 in | Wt 195.0 lb

## 2016-03-05 DIAGNOSIS — Z0000 Encounter for general adult medical examination without abnormal findings: Secondary | ICD-10-CM | POA: Diagnosis not present

## 2016-03-05 NOTE — Progress Notes (Signed)
Pre visit review using our clinic review tool, if applicable. No additional management support is needed unless otherwise documented below in the visit note. 

## 2016-03-05 NOTE — Progress Notes (Addendum)
Subjective:   [name] is a 60 y.o. female who presents for an Initial Medicare Annual Wellness Visit.  Review of Systems: No ROS   Cardiac Risk Factors include: sedentary lifestyle;obesity (BMI >30kg/m2) Sleep patterns:  Sleeps at least 6 hours per night.   Wakes up several times during the night due hot flashes and to void.   Home Safety/Smoke Alarms:  Feels safe at home.  Lives with husband in 2 story home.  Smoke alarms present.  Alarm system.   Firearm Safety:  Kept in a safe place.   Seat Belt Safety/Bike Helmet:  Always wears seat belt.    Counseling:   Eye Exam- Wear corrective glasses.  12/2014-[facility].   Dental-  Goes every 6 months.  [employer].   Female:  Pap- 08/02/14     Mammo- Per patient-completed with Dr. Rana Snare     CCS-10/24/09-normal, repeat in 10 years.--Dr. Arlyce Dice     Objective:    Today's Vitals   03/05/16 0931  BP: 138/82  Pulse: 72  Height:  (1.575 m)  Weight: 195 lb (88.451 kg)  SpO2: 97%  PainSc: 3   PainLoc: Neck   Body mass index is 35.66 kg/(m^2).   Current Medications (verified) Outpatient Encounter Prescriptions as of 03/05/2016  Medication Sig  . Acetaminophen (TYLENOL EXTRA STRENGTH PO) Take by mouth.    . Biotin 5000 MCG TABS Take 1 tablet by mouth daily.  . calcium carbonate 200 MG capsule Take 250 mg by mouth 2 (two) times daily with a meal.    . esomeprazole (NEXIUM) 40 MG capsule Take 1 capsule (40 mg total) by mouth daily. (Patient taking differently: Take 40 mg by mouth daily as needed. )  . multivitamin-iron-minerals-folic acid (CENTRUM) chewable tablet Chew 1 tablet by mouth daily.  . ONE TOUCH ULTRA TEST test strip USE ONCE DAILY  . POTASSIUM CHLORIDE CR PO Take 595 mg by mouth.   . predniSONE (DELTASONE) 5 MG tablet Take 5 mg by mouth as needed.  . pyridoxine (B-6) 100 MG tablet Take 100 mg by mouth daily.  Marland Kitchen FLUOCINOLONE ACETONIDE SCALP 0.01 % OIL as needed. Reported on 03/05/2016   No facility-administered  encounter medications on file as of 03/05/2016.    Allergies (verified) Aspirin; Codeine; Morphine; and Sulfonamide derivatives   History: Past Medical History  Diagnosis Date  . GERD (gastroesophageal reflux disease)   . Hypertension   . Arthritis   . Gastritis   . Hiatal hernia   . Ankylosing spondylitis of multiple sites in spine (HCC)   . Thyroid nodule    Past Surgical History  Procedure Laterality Date  . Esophagogastroduodenoscopy    . Breast reduction surgery  1996  . Total hip arthroplasty      x 2 left, one revision  . Bariatric surgery    . Pelvis replacement  2010   Family History  Problem Relation Age of Onset  . Melanoma Maternal Grandmother   . Breast cancer Maternal Grandmother     and Aunt  . Arthritis Father   . Hypertension Mother   . Hypertension Father   . Hypertension Sister   . Diabetes Father   . Diabetes Sister    Social History   Occupational History  . retired    Social History Main Topics  . Smoking status: Never Smoker   . Smokeless tobacco: Never Used  . Alcohol Use: No  . Drug Use: No  . Sexual Activity: Not on file  Tobacco Counseling Counseling given: No   Activities of Daily Living In your present state of health, do you have any difficulty performing the following activities: 03/05/2016 05/11/2015  Hearing? N N  Vision? N N  Difficulty concentrating or making decisions? N N  Walking or climbing stairs? Y N  Dressing or bathing? N N  Doing errands, shopping? N N  Preparing Food and eating ? N -  Using the Toilet? N -  In the past six months, have you accidently leaked urine? N -  Do you have problems with loss of bowel control? N -  Managing your Medications? N -  Managing your Finances? N -  Housekeeping or managing your Housekeeping? Y -    Immunizations and Health Maintenance Immunization History  Administered Date(s) Administered  . Influenza Whole 06/22/2010  . Tdap 03/16/2014   Health Maintenance Due    Topic Date Due  . Hepatitis C Screening  Apr 20, 1956  . MAMMOGRAM  06/22/2014    Patient Care Team: Donato SchultzYvonne R Lowne Chase, DO as PCP - General Candice Campavid Lowe, MD as Consulting Physician (Obstetrics and Gynecology) Charlie Pitteravid Voellinger, MD as Referring Physician (Surgery) Coralyn HellingVineet Sood, MD as Consulting Physician (Pulmonary Disease)  Dr. Cloyd StagersStroud  Indicate any recent Medical Services you may have received from other than [facility] providers in the past year (date may be approximate).     Assessment:   This is a routine wellness examination for [name].   Hearing/Vision screen  Hearing Screening   125Hz  250Hz  500Hz  1000Hz  2000Hz  4000Hz  8000Hz   Right ear:   Pass Pass Pass Pass   Left ear:   Pass Pass Pass Pass   Comments: No hearing difficulties.   Vision Screening Comments: Wears corrective glasses Last eye exam:  12/2014 [facility]   Dietary issues and exercise activities discussed: Current Exercise Habits: The patient does not participate in regular exercise at present   Diet:  Eat 3 meals per day.  Breakfast:  Protein shake and egg white; cheese toast egg white and [place] bacon; green smoothie shakes. Mid-morning snack:  Fruit or popcorn  Lunch:  Sandwich with chips; burger with a salad; salad chicken.  Snacks:  Oreo cookies; fruit; nuts; yogurt  Dinner:  Meat and salad.  Dessert:  Ice cream cone with frozen yogurt or 2 chocolate chip cookies.  Tries to limit carb intake due to gastric bypass.    Uses [employer]itness[employer].     Goals    . Lose 50lbs by December 2017.        Depression Screen PHQ 2/9 Scores 03/05/2016  PHQ - 2 Score 1    Fall Risk Fall Risk  03/05/2016  Falls in the past year? Yes  Number falls in past yr: 2 or more  Injury with Fall? No  Risk for fall due to : Impaired balance/gait;History of fall(s)  Follow up Education provided;Falls prevention discussed    Cognitive Function: MMSE - Mini Mental State Exam 03/05/2016  Orientation to time 5  Orientation to Place 5   Registration 3  Attention/ Calculation 5  Recall 3  Language- name 2 objects 2  Language- repeat 1  Language- follow 3 step command 3  Language- read & follow direction 1  Write a sentence 1  Copy design 1  Total score 30    Screening Tests Health Maintenance  Topic Date Due  . Hepatitis C Screening  Apr 20, 1956  . MAMMOGRAM  06/22/2014  . HIV Screening  05/10/2016 (Originally 09/14/1973)  . INFLUENZA VACCINE  04/30/2016  .  PAP SMEAR  08/02/2017  . COLONOSCOPY  10/25/2019  . TETANUS/TDAP  03/16/2024      Plan:  Continue doing brain stimulating activities (puzzles, reading, adult coloring books, staying active) to keep memory sharp.   Eat heart healthy diet (full of fruits, vegetables, whole grains, lean protein, water--limit salt, fat, and sugar intake) and increase physical activity as tolerated.  Continue to use [employer]  Continue to see weight loss doctor at [facility].  Take medications as prescribed.    Follow up with Dr. Laury Axon as scheduled.     During the course of the visit, Arleigh was educated and counseled about the following appropriate screening and preventive services:   Vaccines to include Pneumoccal, Influenza, Hepatitis B, Td, Zostavax, HCV  Electrocardiogram  Cardiovascular disease screening  Colorectal cancer screening  Bone density screening  Diabetes screening  Glaucoma screening  Mammography/PAP  Nutrition counseling  Smoking cessation counseling  Patient Instructions (the written plan) were given to the patient.    Tylene Fantasia, RN   03/05/2016

## 2016-03-05 NOTE — Patient Instructions (Addendum)
Continue doing brain stimulating activities (puzzles, reading, adult coloring books, staying active) to keep memory sharp.   Eat heart healthy diet (full of fruits, vegetables, whole grains, lean protein, water--limit salt, fat, and sugar intake) and increase physical activity as tolerated.  Continue to use [employer]  Continue to see weight loss doctor in [facility].  Take medications as prescribed.    Follow up with Dr. Laury Axon as scheduled.     Fat and Cholesterol Restricted Diet High levels of fat and cholesterol in your blood may lead to various health problems, such as diseases of the heart, blood vessels, gallbladder, liver, and pancreas. Fats are concentrated sources of energy that come in various forms. Certain types of fat, including saturated fat, may be harmful in excess. Cholesterol is a substance needed by your body in small amounts. Your body makes all the cholesterol it needs. Excess cholesterol comes from the food you eat. When you have high levels of cholesterol and saturated fat in your blood, health problems can develop because the excess fat and cholesterol will gather along the walls of your blood vessels, causing them to narrow. Choosing the right foods will help you control your intake of fat and cholesterol. This will help keep the levels of these substances in your blood within normal limits and reduce your risk of disease. WHAT IS MY PLAN? Your health care provider recommends that you:  Get no more than __________ % of the total calories in your daily diet from fat.  Limit your intake of saturated fat to less than ______% of your total calories each day.  Limit the amount of cholesterol in your diet to less than _________mg per day. WHAT TYPES OF FAT SHOULD I CHOOSE?  Choose healthy fats more often. Choose monounsaturated and polyunsaturated fats, such as olive and canola oil, flaxseeds, walnuts, almonds, and seeds.  Eat more omega-3 fats. Good choices include  salmon, mackerel, sardines, tuna, flaxseed oil, and ground flaxseeds. Aim to eat fish at least two times a week.  Limit saturated fats. Saturated fats are primarily found in animal products, such as meats, butter, and cream. Plant sources of saturated fats include palm oil, palm kernel oil, and coconut oil.  Avoid foods with partially hydrogenated oils in them. These contain trans fats. Examples of foods that contain trans fats are stick margarine, some tub margarines, cookies, crackers, and other baked goods. WHAT GENERAL GUIDELINES DO I NEED TO FOLLOW? These guidelines for healthy eating will help you control your intake of fat and cholesterol:  Check food labels carefully to identify foods with trans fats or high amounts of saturated fat.  Fill one half of your plate with vegetables and green salads.  Fill one fourth of your plate with whole grains. Look for the word "whole" as the first word in the ingredient list.  Fill one fourth of your plate with lean protein foods.  Limit fruit to two servings a day. Choose fruit instead of juice.  Eat more foods that contain soluble fiber. Examples of foods that contain this type of fiber are apples, broccoli, carrots, beans, peas, and barley. Aim to get 20-30 g of fiber per day.  Eat more home-cooked food and less restaurant, buffet, and fast food.  Limit or avoid alcohol.  Limit foods high in starch and sugar.  Limit fried foods.  Cook foods using methods other than frying. Baking, boiling, grilling, and broiling are all great options.  Lose weight if you are overweight. Losing just 5-10%  of your initial body weight can help your overall health and prevent diseases such as diabetes and heart disease. WHAT FOODS CAN I EAT? Grains Whole grains, such as whole wheat or whole grain breads, crackers, cereals, and pasta. Unsweetened oatmeal, bulgur, barley, quinoa, or brown rice. Corn or whole wheat flour tortillas. Vegetables Fresh or frozen  vegetables (raw, steamed, roasted, or grilled). Green salads. Fruits All fresh, canned (in natural juice), or frozen fruits. Meat and Other Protein Products Ground beef (85% or leaner), grass-fed beef, or beef trimmed of fat. Skinless chicken or [place]. Ground chicken or [place]. Pork trimmed of fat. All fish and seafood. Eggs. Dried beans, peas, or lentils. Unsalted nuts or seeds. Unsalted canned or dry beans. Dairy Low-fat dairy products, such as skim or 1% milk, 2% or reduced-fat cheeses, low-fat ricotta or cottage cheese, or plain low-fat yogurt. Fats and Oils Tub margarines without trans fats. Light or reduced-fat mayonnaise and salad dressings. Avocado. Olive, canola, sesame, or safflower oils. Natural peanut or almond butter (choose ones without added sugar and oil). The items listed above may not be a complete list of recommended foods or beverages. Contact your dietitian for more options. WHAT FOODS ARE NOT RECOMMENDED? Grains White bread. White pasta. White rice. Cornbread. Bagels, pastries, and croissants. Crackers that contain trans fat. Vegetables White potatoes. Corn. Creamed or fried vegetables. Vegetables in a cheese sauce. Fruits Dried fruits. Canned fruit in light or heavy syrup. Fruit juice. Meat and Other Protein Products Fatty cuts of meat. Ribs, chicken wings, bacon, sausage, bologna, salami, chitterlings, fatback, hot dogs, bratwurst, and packaged luncheon meats. Liver and organ meats. Dairy Whole or 2% milk, cream, half-and-half, and cream cheese. Whole milk cheeses. Whole-fat or sweetened yogurt. Full-fat cheeses. Nondairy creamers and whipped toppings. Processed cheese, cheese spreads, or cheese curds. Sweets and Desserts Corn syrup, sugars, honey, and molasses. Candy. Jam and jelly. Syrup. Sweetened cereals. Cookies, pies, cakes, donuts, muffins, and ice cream. Fats and Oils Butter, stick margarine, lard, shortening, ghee, or bacon fat. Coconut, palm kernel, or palm  oils. Beverages Alcohol. Sweetened drinks (such as sodas, lemonade, and fruit drinks or punches). The items listed above may not be a complete list of foods and beverages to avoid. Contact your dietitian for more information.   This information is not intended to replace advice given to you by your health care provider. Make sure you discuss any questions you have with your health care provider.   Document Released: 09/16/2005 Document Revised: 10/07/2014 Document Reviewed: 12/15/2013 Elsevier Interactive Patient Education 2016 Elsevier Inc.  Fat and Cholesterol Restricted Diet Getting too much fat and cholesterol in your diet may cause health problems. Following this diet helps keep your fat and cholesterol at normal levels. This can keep you from getting sick. WHAT TYPES OF FAT SHOULD I CHOOSE?  Choose monosaturated and polyunsaturated fats. These are found in foods such as olive oil, canola oil, flaxseeds, walnuts, almonds, and seeds.  Eat more omega-3 fats. Good choices include salmon, mackerel, sardines, tuna, flaxseed oil, and ground flaxseeds.  Limit saturated fats. These are in animal products such as meats, butter, and cream. They can also be in plant products such as palm oil, palm kernel oil, and coconut oil.   Avoid foods with partially hydrogenated oils in them. These contain trans fats. Examples of foods that have trans fats are stick margarine, some tub margarines, cookies, crackers, and other baked goods. WHAT GENERAL GUIDELINES DO I NEED TO FOLLOW?   Check food labels. Look for  the words "trans fat" and "saturated fat."  When preparing a meal:  Fill half of your plate with vegetables and green salads.  Fill one fourth of your plate with whole grains. Look for the word "whole" as the first word in the ingredient list.  Fill one fourth of your plate with lean protein foods.  Limit fruit to two servings a day. Choose fruit instead of juice.  Eat more foods with  soluble fiber. Examples of foods with this type of fiber are apples, broccoli, carrots, beans, peas, and barley. Try to get 20-30 g (grams) of fiber per day.  Eat more home-cooked foods. Eat less at restaurants and buffets.  Limit or avoid alcohol.  Limit foods high in starch and sugar.  Limit fried foods.  Cook foods without frying them. Baking, boiling, grilling, and broiling are all great options.  Lose weight if you are overweight. Losing even a small amount of weight can help your overall health. It can also help prevent diseases such as diabetes and heart disease. WHAT FOODS CAN I EAT? Grains Whole grains, such as whole wheat or whole grain breads, crackers, cereals, and pasta. Unsweetened oatmeal, bulgur, barley, quinoa, or brown rice. Corn or whole wheat flour tortillas. Vegetables Fresh or frozen vegetables (raw, steamed, roasted, or grilled). Green salads. Fruits All fresh, canned (in natural juice), or frozen fruits. Meat and Other Protein Products Ground beef (85% or leaner), grass-fed beef, or beef trimmed of fat. Skinless chicken or [place]. Ground chicken or [place]. Pork trimmed of fat. All fish and seafood. Eggs. Dried beans, peas, or lentils. Unsalted nuts or seeds. Unsalted canned or dry beans. Dairy Low-fat dairy products, such as skim or 1% milk, 2% or reduced-fat cheeses, low-fat ricotta or cottage cheese, or plain low-fat yogurt. Fats and Oils Tub margarines without trans fats. Light or reduced-fat mayonnaise and salad dressings. Avocado. Olive, canola, sesame, or safflower oils. Natural peanut or almond butter (choose ones without added sugar and oil). The items listed above may not be a complete list of recommended foods or beverages. Contact your dietitian for more options. WHAT FOODS ARE NOT RECOMMENDED? Grains White bread. White pasta. White rice. Cornbread. Bagels, pastries, and croissants. Crackers that contain trans fat. Vegetables White potatoes. Corn.  Creamed or fried vegetables. Vegetables in a cheese sauce. Fruits Dried fruits. Canned fruit in light or heavy syrup. Fruit juice. Meat and Other Protein Products Fatty cuts of meat. Ribs, chicken wings, bacon, sausage, bologna, salami, chitterlings, fatback, hot dogs, bratwurst, and packaged luncheon meats. Liver and organ meats. Dairy Whole or 2% milk, cream, half-and-half, and cream cheese. Whole milk cheeses. Whole-fat or sweetened yogurt. Full-fat cheeses. Nondairy creamers and whipped toppings. Processed cheese, cheese spreads, or cheese curds. Sweets and Desserts Corn syrup, sugars, honey, and molasses. Candy. Jam and jelly. Syrup. Sweetened cereals. Cookies, pies, cakes, donuts, muffins, and ice cream. Fats and Oils Butter, stick margarine, lard, shortening, ghee, or bacon fat. Coconut, palm kernel, or palm oils. Beverages Alcohol. Sweetened drinks (such as sodas, lemonade, and fruit drinks or punches). The items listed above may not be a complete list of foods and beverages to avoid. Contact your dietitian for more information.   This information is not intended to replace advice given to you by your health care provider. Make sure you discuss any questions you have with your health care provider.   Document Released: 03/17/2012 Document Revised: 10/07/2014 Document Reviewed: 12/16/2013 Elsevier Interactive Patient Education 2016 [employer].  Fall Prevention in the Home  Falls can cause injuries. They can happen to people of all ages. There are many things you can do to make your home safe and to help prevent falls.  WHAT CAN I DO ON THE OUTSIDE OF MY HOME?  Regularly fix the edges of walkways and driveways and fix any cracks.  Remove anything that might make you trip as you walk through a door, such as a raised step or threshold.  Trim any bushes or trees on the path to your home.  Use bright outdoor lighting.  Clear any walking paths of anything that might make someone  trip, such as rocks or tools.  Regularly check to see if handrails are loose or broken. Make sure that both sides of any steps have handrails.  Any raised decks and porches should have guardrails on the edges.  Have any leaves, snow, or ice cleared regularly.  Use sand or salt on walking paths during winter.  Clean up any spills in your garage right away. This includes oil or grease spills. WHAT CAN I DO IN THE BATHROOM?   Use night lights.  Install grab bars by the toilet and in the tub and shower. Do not use towel bars as grab bars.  Use non-skid mats or decals in the tub or shower.  If you need to sit down in the shower, use a plastic, non-slip stool.  Keep the floor dry. Clean up any water that spills on the floor as soon as it happens.  Remove soap buildup in the tub or shower regularly.  Attach bath mats securely with double-sided non-slip rug tape.  Do not have throw rugs and other things on the floor that can make you trip. WHAT CAN I DO IN THE BEDROOM?  Use night lights.  Make sure that you have a light by your bed that is easy to reach.  Do not use any sheets or blankets that are too big for your bed. They should not hang down onto the floor.  Have a firm chair that has side arms. You can use this for support while you get dressed.  Do not have throw rugs and other things on the floor that can make you trip. WHAT CAN I DO IN THE KITCHEN?  Clean up any spills right away.  Avoid walking on wet floors.  Keep items that you use a lot in easy-to-reach places.  If you need to reach something above you, use a strong step stool that has a grab bar.  Keep electrical cords out of the way.  Do not use floor polish or wax that makes floors slippery. If you must use wax, use non-skid floor wax.  Do not have throw rugs and other things on the floor that can make you trip. WHAT CAN I DO WITH MY STAIRS?  Do not leave any items on the stairs.  Make sure that there  are handrails on both sides of the stairs and use them. Fix handrails that are broken or loose. Make sure that handrails are as long as the stairways.  Check any carpeting to make sure that it is firmly attached to the stairs. Fix any carpet that is loose or worn.  Avoid having throw rugs at the top or bottom of the stairs. If you do have throw rugs, attach them to the floor with carpet tape.  Make sure that you have a light switch at the top of the stairs and the bottom of the stairs. If you do not have  them, ask someone to add them for you. WHAT ELSE CAN I DO TO HELP PREVENT FALLS?  Wear shoes that:  Do not have high heels.  Have rubber bottoms.  Are comfortable and fit you well.  Are closed at the toe. Do not wear sandals.  If you use a stepladder:  Make sure that it is fully opened. Do not climb a closed stepladder.  Make sure that both sides of the stepladder are locked into place.  Ask someone to hold it for you, if possible.  Clearly mark and make sure that you can see:  Any grab bars or handrails.  First and last steps.  Where the edge of each step is.  Use tools that help you move around (mobility aids) if they are needed. These include:  Canes.  Walkers.  Scooters.  Crutches.  Turn on the lights when you go into a dark area. Replace any light bulbs as soon as they burn out.  Set up your furniture so you have a clear path. Avoid moving your furniture around.  If any of your floors are uneven, fix them.  If there are any pets around you, be aware of where they are.  Review your medicines with your doctor. Some medicines can make you feel dizzy. This can increase your chance of falling. Ask your doctor what other things that you can do to help prevent falls.   This information is not intended to replace advice given to you by your health care provider. Make sure you discuss any questions you have with your health care provider.   Document Released:  07/13/2009 Document Revised: 01/31/2015 Document Reviewed: 10/21/2014 Elsevier Interactive Patient Education [employer]

## 2016-03-07 ENCOUNTER — Ambulatory Visit (HOSPITAL_BASED_OUTPATIENT_CLINIC_OR_DEPARTMENT_OTHER): Payer: Medicare Other | Attending: Pulmonary Disease | Admitting: Pulmonary Disease

## 2016-03-07 VITALS — Ht 62.0 in | Wt 194.0 lb

## 2016-03-07 DIAGNOSIS — R0683 Snoring: Secondary | ICD-10-CM | POA: Insufficient documentation

## 2016-03-07 DIAGNOSIS — G4733 Obstructive sleep apnea (adult) (pediatric): Secondary | ICD-10-CM | POA: Diagnosis not present

## 2016-03-08 ENCOUNTER — Encounter: Payer: Self-pay | Admitting: Pulmonary Disease

## 2016-03-08 ENCOUNTER — Telehealth: Payer: Self-pay | Admitting: Pulmonary Disease

## 2016-03-08 DIAGNOSIS — G4733 Obstructive sleep apnea (adult) (pediatric): Secondary | ICD-10-CM

## 2016-03-08 HISTORY — DX: Obstructive sleep apnea (adult) (pediatric): G47.33

## 2016-03-08 NOTE — Procedures (Signed)
Patient Name: [name], [name] Study Date: 03/07/2016 Gender: Female D.O.B: [birth year] Age (years): [age] Referring Provider: Coralyn HellingVineet Makenzey Nanni MD, ABSM Height (inches): 62 Interpreting Physician: Coralyn HellingVineet Sricharan Lacomb MD, ABSM Weight (lbs): 194 RPSGT: Neeriemer, Holly BMI: 35 MRN: [id number] Neck Size: 13.00  CLINICAL INFORMATION Sleep Study Type: NPSG Indication for sleep study: Snoring Epworth Sleepiness Score: 14  SLEEP STUDY TECHNIQUE As per the AASM Manual for the Scoring of Sleep and Associated Events v2.3 (April 2016) with a hypopnea requiring 4% desaturations. The channels recorded and monitored were frontal, central and occipital EEG, electrooculogram (EOG), submentalis EMG (chin), nasal and oral airflow, thoracic and abdominal wall motion, anterior tibialis EMG, snore microphone, electrocardiogram, and pulse oximetry.  MEDICATIONS Patient's medications include: reviewed in electronic medical record. Medications self-administered by patient during sleep study : No sleep medicine administered.  SLEEP ARCHITECTURE The study was initiated at 10:01:30 PM and ended at 4:48:09 AM. Sleep onset time was 25.9 minutes and the sleep efficiency was 79.7%. The total sleep time was 324.0 minutes. Stage REM latency was 169.0 minutes. The patient spent 12.65% of the night in stage N1 sleep, 62.96% in stage N2 sleep, 0.93% in stage N3 and 23.46% in REM. Alpha intrusion was absent. Supine sleep was 70.53%.  RESPIRATORY PARAMETERS The overall apnea/hypopnea index (AHI) was 15.9 per hour. There were 83 total apneas, including 83 obstructive, 0 central and 0 mixed apneas. There were 3 hypopneas and 10 RERAs. The AHI during Stage REM sleep was 66.3 per hour. AHI while supine was 15.5 per hour. The mean oxygen saturation was 96.93%. The minimum SpO2 during sleep was 84.00%. Soft snoring was noted during this study.  CARDIAC DATA The 2 lead EKG demonstrated sinus rhythm. The mean heart rate was 65.62 beats per  minute. Other EKG findings include: None.  LEG MOVEMENT DATA The total PLMS were 0 with a resulting PLMS index of 0.00. Associated arousal with leg movement index was 0.0 .  IMPRESSIONS This study shows moderate obstructive sleep apnea with an AHI of 15.9 and SaO2 low of 84%.  She had a significant REM effect with the majority of her events happening in REM sleep.  The REM AHI was 66.3.  DIAGNOSIS - Obstructive Sleep Apnea (327.23 [G47.33 ICD-10])  RECOMMENDATIONS Additional therapies include weight loss, CPAP, oral appliance, or surgical assessment.   Coralyn HellingVineet Diana Armijo, MD, ABSM Diplomate, American Board of Sleep Medicine 03/08/2016, 11:05 AM  NPI: [id number]

## 2016-03-08 NOTE — Telephone Encounter (Signed)
PSG 03/07/16 >> AHI 15.9, SaO2 low 84%.  REM AHI 66.3.   Will have my nurse inform pt that sleep study shows moderate sleep apnea.  Options are 1) CPAP now, 2) ROV first.  If She is agreeable to CPAP, then please send order for auto CPAP range 5 to 15 cm H2O with heated humidity and mask of choice.  Have download sent 1 month after starting CPAP and set up ROV 2 months after starting CPAP >> ROV can be with me or nurse practitioner.

## 2016-03-11 ENCOUNTER — Other Ambulatory Visit (HOSPITAL_BASED_OUTPATIENT_CLINIC_OR_DEPARTMENT_OTHER): Payer: Self-pay

## 2016-03-11 DIAGNOSIS — R0683 Snoring: Secondary | ICD-10-CM

## 2016-03-14 DIAGNOSIS — Z6836 Body mass index (BMI) 36.0-36.9, adult: Secondary | ICD-10-CM | POA: Diagnosis not present

## 2016-03-14 DIAGNOSIS — G4733 Obstructive sleep apnea (adult) (pediatric): Secondary | ICD-10-CM | POA: Diagnosis not present

## 2016-03-14 DIAGNOSIS — I10 Essential (primary) hypertension: Secondary | ICD-10-CM | POA: Diagnosis not present

## 2016-03-14 DIAGNOSIS — M450 Ankylosing spondylitis of multiple sites in spine: Secondary | ICD-10-CM | POA: Diagnosis not present

## 2016-03-14 DIAGNOSIS — E669 Obesity, unspecified: Secondary | ICD-10-CM | POA: Diagnosis not present

## 2016-03-15 NOTE — Telephone Encounter (Signed)
Results have been explained to patient, pt expressed understanding. Appt scheduled with TP to review results 03/19/16 at 3pm.  Nothing further needed.

## 2016-03-19 ENCOUNTER — Ambulatory Visit: Payer: Medicare Other | Admitting: Adult Health

## 2016-03-21 DIAGNOSIS — Z713 Dietary counseling and surveillance: Secondary | ICD-10-CM | POA: Diagnosis not present

## 2016-03-21 DIAGNOSIS — Z6835 Body mass index (BMI) 35.0-35.9, adult: Secondary | ICD-10-CM | POA: Diagnosis not present

## 2016-03-21 DIAGNOSIS — E669 Obesity, unspecified: Secondary | ICD-10-CM | POA: Diagnosis not present

## 2016-04-12 ENCOUNTER — Ambulatory Visit (INDEPENDENT_AMBULATORY_CARE_PROVIDER_SITE_OTHER): Payer: Medicare Other | Admitting: Adult Health

## 2016-04-12 ENCOUNTER — Encounter: Payer: Self-pay | Admitting: Adult Health

## 2016-04-12 VITALS — BP 118/72 | HR 70 | Temp 98.1°F | Ht 61.0 in | Wt 201.0 lb

## 2016-04-12 DIAGNOSIS — G4733 Obstructive sleep apnea (adult) (pediatric): Secondary | ICD-10-CM | POA: Diagnosis not present

## 2016-04-12 NOTE — Patient Instructions (Signed)
Begin CPAP at bedtime Wear CPAP at least 4 hours nightly Work on weight loss Do not drive if sleepy Download in 6 weeks   Follow up with Dr. Craige CottaSood or Banessa Mao in 2 months and As needed

## 2016-04-12 NOTE — Progress Notes (Signed)
Subjective:    Patient ID: [name], female    DOB: 02-06-1956, 60 y.o.   MRN: [id number]  HPI 60 yo female seen for sleep consult 11/2015 for snoring and daytime sleepiness. Found to have moderate OSA .   TEST  PSG 03/07/16 >> AHI 15.9, SaO2 low 84%. REM AHI 66.3.  04/12/2016 Follow up : OSA  Pt Returns for a four-month follow-up. Patient was seen for sleep consult in March 2017. Patient underwent a sleep study 03/07/2016 that showed moderate sleep apnea. AHI 15.9, SaO2 low 84%.. We discussed her sleep study results. We reviewed treatment options including weight loss, oral appliance and C Pap machine. Patient would like to proceed with a C Pap . Patient has had previous gastric bypass 2006 . However, has unfortunately gained back a lot of  her weight. (lost 100lbs ) . Is trying to lose her weight.    Past Medical History  Diagnosis Date  . GERD (gastroesophageal reflux disease)   . Hypertension   . Arthritis   . Gastritis   . Hiatal hernia   . Ankylosing spondylitis of multiple sites in spine (HCC)   . Thyroid nodule   . OSA (obstructive sleep apnea) 03/08/2016   Current Outpatient Prescriptions on File Prior to Visit  Medication Sig Dispense Refill  . Acetaminophen (TYLENOL EXTRA STRENGTH PO) Take by mouth.      . Biotin 5000 MCG TABS Take 1 tablet by mouth daily.    . calcium carbonate 200 MG capsule Take 250 mg by mouth 2 (two) times daily with a meal.      . esomeprazole (NEXIUM) 40 MG capsule Take 1 capsule (40 mg total) by mouth daily. (Patient taking differently: Take 40 mg by mouth daily as needed. ) 90 capsule 3  . multivitamin-iron-minerals-folic acid (CENTRUM) chewable tablet Chew 1 tablet by mouth daily.    . ONE TOUCH ULTRA TEST test strip USE ONCE DAILY 50 each 10  . POTASSIUM CHLORIDE CR PO Take 595 mg by mouth.     . predniSONE (DELTASONE) 5 MG tablet Take 5 mg by mouth as needed.    . pyridoxine (B-6) 100 MG tablet Take 100 mg by mouth daily.    Marland Kitchen  FLUOCINOLONE ACETONIDE SCALP 0.01 % OIL as needed. Reported on 04/12/2016     No current facility-administered medications on file prior to visit.      Review of Systems Constitutional:   No  weight loss, night sweats,  Fevers, chills, fatigue, or  lassitude.  HEENT:   No headaches,  Difficulty swallowing,  Tooth/dental problems, or  Sore throat,                No sneezing, itching, ear ache, nasal congestion, post nasal drip,   CV:  No chest pain,  Orthopnea, PND, swelling in lower extremities, anasarca, dizziness, palpitations, syncope.   GI  No heartburn, indigestion, abdominal pain, nausea, vomiting, diarrhea, change in bowel habits, loss of appetite, bloody stools.   Resp: No shortness of breath with exertion or at rest.  No excess mucus, no productive cough,  No non-productive cough,  No coughing up of blood.  No change in color of mucus.  No wheezing.  No chest wall deformity  Skin: no rash or lesions.  GU: no dysuria, change in color of urine, no urgency or frequency.  No flank pain, no hematuria   MS:  No joint pain or swelling.  No decreased range of motion.  No back pain.  Psych:  No change in mood or affect. No depression or anxiety.  No memory loss.         Objective:   Physical Exam Filed Vitals:   04/12/16 1053  BP: 118/72  Pulse: 70  Temp: 98.1 F (36.7 C)  TempSrc: Oral  Height: 5\' 1"  (1.549 m)  Weight: 201 lb (91.173 kg)  SpO2: 96%   GEN: A/Ox3; pleasant , NAD, well nourished   HEENT:  [place]/AT,  EACs-clear, TMs-wnl, NOSE-clear, THROAT-clear, no lesions, no postnasal drip or exudate noted. Class 2 MP airway   NECK:  Supple w/ fair ROM; no JVD; normal carotid impulses w/o bruits; no thyromegaly or nodules palpated; no lymphadenopathy.  RESP  Clear  P & A; w/o, wheezes/ rales/ or rhonchi.no accessory muscle use, no dullness to percussion  CARD:  RRR, no m/r/g  , no peripheral edema, pulses intact, no cyanosis or clubbing.  GI:   Soft & nt; nml bowel  sounds; no organomegaly or masses detected.  Musco: Warm bil, no deformities or joint swelling noted.   Neuro: alert, no focal deficits noted.    Skin: Warm, no lesions or rashes  Dosia Yodice NP-C  [facility] 04/12/2016        Assessment & Plan:

## 2016-04-12 NOTE — Assessment & Plan Note (Signed)
Moderate OSA  Begin CPAP   Plan  Begin CPAP at bedtime Wear CPAP at least 4 hours nightly Work on weight loss Do not drive if sleepy Download in 6 weeks   Follow up with Dr. Craige CottaSood or Monai Hindes in 2 months and As needed

## 2016-04-12 NOTE — Progress Notes (Signed)
Reviewed and agree with assessment/plan.  Coralyn HellingVineet Yobana Culliton, MD [facility] 04/12/2016, 2:27 PM Pager:  [phone]

## 2016-04-19 DIAGNOSIS — G4733 Obstructive sleep apnea (adult) (pediatric): Secondary | ICD-10-CM | POA: Diagnosis not present

## 2016-04-26 ENCOUNTER — Telehealth: Payer: Self-pay | Admitting: Adult Health

## 2016-04-26 NOTE — Telephone Encounter (Signed)
Spoke with pt, states that her cpap is not working well for her- states she is claustrophobic and has difficulty wearing the mask, after wearing it will be SOB and coughing in the morning.  Pt states she will not be able to tolerate cpap machine, and wants to know what her other options are for OSA.    VS please advise.  Thanks.

## 2016-04-26 NOTE — Telephone Encounter (Signed)
Alternative would be oral appliance.  She can check with her dentist to get this made, or we can refer her to Dr. Althea Grimmer to assess for oral appliance to treat obstructive sleep apnea.

## 2016-04-29 NOTE — Telephone Encounter (Signed)
Spoke with pt, aware of recs.  Pt states she will check with her dentist and will call back if she needs a referral to Dr. Myrtis Ser.  Nothing further needed at this time.

## 2016-05-24 ENCOUNTER — Ambulatory Visit (HOSPITAL_COMMUNITY)
Admission: EM | Admit: 2016-05-24 | Discharge: 2016-05-24 | Disposition: A | Discharge status: Home / Self Care | Payer: Medicare Other | Attending: Family Medicine | Admitting: Family Medicine

## 2016-05-24 ENCOUNTER — Telehealth: Payer: Self-pay | Admitting: Family Medicine

## 2016-05-24 ENCOUNTER — Encounter (HOSPITAL_COMMUNITY): Payer: Self-pay | Admitting: *Deleted

## 2016-05-24 DIAGNOSIS — J300 Vasomotor rhinitis: Secondary | ICD-10-CM

## 2016-05-24 MED ORDER — PSEUDOEPH-BROMPHEN-DM 30-2-10 MG/5ML PO SYRP: 10.0000 mL | ORAL_SOLUTION | Freq: Four times a day (QID) | ORAL | 1 refills | Status: DC | PRN

## 2016-05-24 MED ORDER — IPRATROPIUM BROMIDE 0.06 % NA SOLN: 2.0000 | Freq: Four times a day (QID) | NASAL | 1 refills | Status: DC

## 2016-05-24 NOTE — ED Triage Notes (Signed)
Pt  Reports   Symptoms  Of    Cough     Congested         X  2  Days     Symptoms       Not  releived  By  otc  meds  Cough

## 2016-05-24 NOTE — Telephone Encounter (Signed)
Pt would like to know if PCP could call her in a antibiotics because she is having some mucus and congestion.  Pharmacy: [facility] [place] [place]    CB: [phone]

## 2016-05-24 NOTE — Telephone Encounter (Signed)
Pt has been scheduled.  °

## 2016-05-24 NOTE — ED Provider Notes (Signed)
[facility]    CSN: [id number] Arrival date & time: 05/24/16  7829  First Provider Contact:  First MD Initiated Contact with Patient 05/24/16 1852        History   Chief Complaint No chief complaint on file.   HPI [name] is a 60 y.o. female.   The history is provided by the patient and the spouse.  URI  Presenting symptoms: congestion, cough, rhinorrhea and sore throat   Presenting symptoms: no fever   Severity:  Mild Onset quality:  Gradual Duration:  2 days Progression:  Unchanged Chronicity:  New Relieved by:  None tried Worsened by:  Nothing Ineffective treatments:  None tried Associated symptoms: arthralgias   Associated symptoms: no wheezing     Past Medical History:  Diagnosis Date  . Ankylosing spondylitis of multiple sites in spine (HCC)   . Arthritis   . Gastritis   . GERD (gastroesophageal reflux disease)   . Hiatal hernia   . Hypertension   . OSA (obstructive sleep apnea) 03/08/2016  . Thyroid nodule     Patient Active Problem List   Diagnosis Date Noted  . OSA (obstructive sleep apnea) 03/08/2016  . Seasonal allergies 05/14/2015  . Obesity (BMI 30-39.9) 03/16/2014  . Long-term current use of steroids 04/20/2013  . Noninfectious gastroenteritis and colitis 03/17/2013  . Calf pain 10/13/2012  . Acute sprain or strain of cervical region 04/10/2012  . LEG CRAMPS, NOCTURNAL 11/14/2010  . OTHER SPECIFIED DISEASE OF WHITE BLOOD CELLS 07/23/2010  . CONSTIPATION 04/06/2010  . CORNEAL ABRASION, LEFT 01/22/2010  . CHEST PAIN UNSPECIFIED 10/11/2009  . HYPOGLYCEMIA, UNSPECIFIED 01/30/2009  . BLOOD IN STOOL 12/02/2008  . ANKYLOSING SPONDYLITIS 03/31/2008  . MUSCLE PAIN 03/31/2008  . BARIATRIC SURGERY STATUS 01/14/2008  . THYROID CYST 06/26/2007  . MORBID OBESITY 06/26/2007  . HYPERTENSION 06/26/2007  . GERD 06/26/2007  . IBS 06/26/2007  . LYMPHADENOPATHY 06/10/2007  . THYROID NODULE, HX OF 06/10/2007    Past Surgical History:   Procedure Laterality Date  . BARIATRIC SURGERY    . BREAST REDUCTION SURGERY  1996  . ESOPHAGOGASTRODUODENOSCOPY    . pelvis replacement  2010  . TOTAL HIP ARTHROPLASTY     x 2 left, one revision    OB History    No data available       Home Medications    Prior to Admission medications   Medication Sig Start Date End Date Taking? Authorizing Provider  Acetaminophen (TYLENOL EXTRA STRENGTH PO) Take by mouth.      Historical Provider, MD  Biotin 5000 MCG TABS Take 1 tablet by mouth daily.    Historical Provider, MD  calcium carbonate 200 MG capsule Take 250 mg by mouth 2 (two) times daily with a meal.      Historical Provider, MD  esomeprazole (NEXIUM) 40 MG capsule Take 1 capsule (40 mg total) by mouth daily. Patient taking differently: Take 40 mg by mouth daily as needed.  10/06/15   Ruffin Frederick, MD  FLUOCINOLONE ACETONIDE SCALP 0.01 % OIL as needed. Reported on 04/12/2016 04/13/15   Historical Provider, MD  multivitamin-iron-minerals-folic acid (CENTRUM) chewable tablet Chew 1 tablet by mouth daily.    Historical Provider, MD  ONE TOUCH ULTRA TEST test strip USE ONCE DAILY 01/31/11   Grayling Congress Lowne Chase, DO  Phentermine-Topiramate (QSYMIA) 7.5-46 MG CP24 Take by mouth. Reported on 04/12/2016 03/14/16   Historical Provider, MD  POTASSIUM CHLORIDE CR PO Take 595 mg by mouth.  Historical Provider, MD  predniSONE (DELTASONE) 5 MG tablet Take 5 mg by mouth as needed.    Historical Provider, MD  pyridoxine (B-6) 100 MG tablet Take 100 mg by mouth daily.    Historical Provider, MD    Family History Family History  Problem Relation Age of Onset  . Melanoma Maternal Grandmother   . Breast cancer Maternal Grandmother     and Aunt  . Arthritis Father   . Hypertension Mother   . Hypertension Father   . Hypertension Sister   . Diabetes Father   . Diabetes Sister     Social History Social History  Substance Use Topics  . Smoking status: Never Smoker  . Smokeless  tobacco: Never Used  . Alcohol use No     Allergies   Aspirin; Codeine; Morphine; and Sulfonamide derivatives   Review of Systems Review of Systems  Constitutional: Negative.  Negative for fever.  HENT: Positive for congestion, postnasal drip, rhinorrhea and sore throat.   Respiratory: Positive for cough. Negative for shortness of breath and wheezing.   Cardiovascular: Negative.   Gastrointestinal: Negative.   Musculoskeletal: Positive for arthralgias.  All other systems reviewed and are negative.    Physical Exam Triage Vital Signs ED Triage Vitals [05/24/16 1852]  Enc Vitals Group     BP 131/86     Pulse Rate 92     Resp 16     Temp 98.6 F (37 C)     Temp Source Oral     SpO2 99 %     Weight      Height      Head Circumference      Peak Flow      Pain Score      Pain Loc      Pain Edu?      Excl. in GC?    No data found.   Updated Vital Signs BP 131/86 (BP Location: Left Arm)   Pulse 92   Temp 98.6 F (37 C) (Oral)   Resp 16   SpO2 99%   Visual Acuity Right Eye Distance:   Left Eye Distance:   Bilateral Distance:    Right Eye Near:   Left Eye Near:    Bilateral Near:     Physical Exam  Constitutional: She appears well-developed and well-nourished. No distress.  HENT:  Right Ear: External ear normal.  Left Ear: External ear normal.  Nose: Mucosal edema and rhinorrhea present.  Mouth/Throat: Oropharynx is clear and moist. No oropharyngeal exudate.  Eyes: Pupils are equal, round, and reactive to light.  Neck: Normal range of motion. Neck supple.  Cardiovascular: Normal rate, regular rhythm, normal heart sounds and intact distal pulses.   Pulmonary/Chest: Effort normal and breath sounds normal.  Skin: Skin is warm and dry.  Nursing note and vitals reviewed.    UC Treatments / Results  Labs (all labs ordered are listed, but only abnormal results are displayed) Labs Reviewed - No data to display  EKG  EKG Interpretation None        Radiology No results found.  Procedures Procedures (including critical care time)  Medications Ordered in UC Medications - No data to display   Initial Impression / Assessment and Plan / UC Course  I have reviewed the triage vital signs and the nursing notes.  Pertinent labs & imaging results that were available during my care of the patient were reviewed by me and considered in my medical decision making (see chart for details).  Clinical Course      Final Clinical Impressions(s) / UC Diagnoses   Final diagnoses:  None    New Prescriptions New Prescriptions   No medications on file     Linna HoffJames D Kindl, MD 05/24/16 1905

## 2016-05-24 NOTE — Telephone Encounter (Signed)
She needs to be seen.      KP 

## 2016-05-27 ENCOUNTER — Ambulatory Visit: Payer: Medicare Other | Admitting: Family Medicine

## 2016-06-10 DIAGNOSIS — M25552 Pain in left hip: Secondary | ICD-10-CM | POA: Diagnosis not present

## 2016-06-10 DIAGNOSIS — M79605 Pain in left leg: Secondary | ICD-10-CM | POA: Diagnosis not present

## 2016-06-11 ENCOUNTER — Encounter: Payer: Self-pay | Admitting: Family Medicine

## 2016-06-11 ENCOUNTER — Ambulatory Visit (INDEPENDENT_AMBULATORY_CARE_PROVIDER_SITE_OTHER): Payer: Medicare Other | Admitting: Family Medicine

## 2016-06-11 VITALS — BP 123/84 | HR 74 | Temp 97.8°F | Ht 61.0 in | Wt 201.4 lb

## 2016-06-11 DIAGNOSIS — K219 Gastro-esophageal reflux disease without esophagitis: Secondary | ICD-10-CM

## 2016-06-11 DIAGNOSIS — R079 Chest pain, unspecified: Secondary | ICD-10-CM

## 2016-06-11 DIAGNOSIS — R739 Hyperglycemia, unspecified: Secondary | ICD-10-CM

## 2016-06-11 DIAGNOSIS — Z0000 Encounter for general adult medical examination without abnormal findings: Secondary | ICD-10-CM | POA: Diagnosis not present

## 2016-06-11 DIAGNOSIS — I10 Essential (primary) hypertension: Secondary | ICD-10-CM

## 2016-06-11 LAB — COMPREHENSIVE METABOLIC PANEL
ALBUMIN: 4.3 g/dL (ref 3.5–5.2)
ALT: 19 U/L (ref 0–35)
AST: 23 U/L (ref 0–37)
Alkaline Phosphatase: 67 U/L (ref 39–117)
BILIRUBIN TOTAL: 0.6 mg/dL (ref 0.2–1.2)
BUN: 11 mg/dL (ref 6–23)
CALCIUM: 9.5 mg/dL (ref 8.4–10.5)
CO2: 30 meq/L (ref 19–32)
CREATININE: 0.64 mg/dL (ref 0.40–1.20)
Chloride: 101 mEq/L (ref 96–112)
GFR: 122.68 mL/min (ref >60.00)
Glucose, Bld: 112 mg/dL — ABNORMAL HIGH (ref 70–99)
Potassium: 3.2 mEq/L — ABNORMAL LOW (ref 3.5–5.1)
Sodium: 139 mEq/L (ref 135–145)
Total Protein: 8 g/dL (ref 6.0–8.3)

## 2016-06-11 LAB — POCT URINALYSIS DIPSTICK
Bilirubin, UA: NEGATIVE
GLUCOSE UA: NEGATIVE
Ketones, UA: NEGATIVE
Leukocytes, UA: NEGATIVE
NITRITE UA: NEGATIVE
PROTEIN UA: NEGATIVE
RBC UA: NEGATIVE
Spec Grav, UA: 1.01
UROBILINOGEN UA: 0.2
pH, UA: 6

## 2016-06-11 LAB — CBC WITH DIFFERENTIAL/PLATELET
BASOS ABS: 0 10*3/uL (ref 0.0–0.1)
Basophils Relative: 0.3 % (ref 0.0–3.0)
EOS ABS: 0 10*3/uL (ref 0.0–0.7)
Eosinophils Relative: 0.2 % (ref 0.0–5.0)
HEMATOCRIT: 40 % (ref 36.0–46.0)
HEMOGLOBIN: 13.5 g/dL (ref 12.0–15.0)
LYMPHS PCT: 31.1 % (ref 12.0–46.0)
Lymphs Abs: 2.4 10*3/uL (ref 0.7–4.0)
MCHC: 33.7 g/dL (ref 30.0–36.0)
MCV: 93.9 fl (ref 78.0–100.0)
Monocytes Absolute: 0.8 10*3/uL (ref 0.1–1.0)
Monocytes Relative: 9.9 % (ref 3.0–12.0)
Neutro Abs: 4.5 10*3/uL (ref 1.4–7.7)
Neutrophils Relative %: 58.5 % (ref 43.0–77.0)
Platelets: 361 10*3/uL (ref 150.0–400.0)
RBC: 4.26 Mil/uL (ref 3.87–5.11)
RDW: 13.9 % (ref 11.5–15.5)
WBC: 7.7 10*3/uL (ref 4.0–10.5)

## 2016-06-11 LAB — LIPID PANEL
CHOL/HDL RATIO: 2
CHOLESTEROL: 171 mg/dL (ref 0–200)
HDL: 94.5 mg/dL (ref >39.00)
LDL Cholesterol: 65 mg/dL (ref 0–99)
NonHDL: 76.94
TRIGLYCERIDES: 60 mg/dL (ref 0.0–149.0)
VLDL: 12 mg/dL (ref 0.0–40.0)

## 2016-06-11 LAB — HEMOGLOBIN A1C: Hgb A1c MFr Bld: 5.5 % (ref 4.6–6.5)

## 2016-06-11 MED ORDER — ESOMEPRAZOLE MAGNESIUM 40 MG PO CPDR: 40.0000 mg | DELAYED_RELEASE_CAPSULE | Freq: Two times a day (BID) | ORAL | 3 refills | Status: DC

## 2016-06-11 NOTE — Progress Notes (Signed)
Subjective:     [name] is a 60 y.o. female and is here for a comprehensive physical exam. The patient reports problems - chest pains due to gerd most likely.  she has had cardiac workup in the past.  .-- w/u was negative No other complaints.    Social History   Social History  . Marital status: Married    Spouse name: N/A  . Number of children: N/A  . Years of education: N/A   Occupational History  . retired    Social History Main Topics  . Smoking status: Never Smoker  . Smokeless tobacco: Never Used  . Alcohol use No  . Drug use: No  . Sexual activity: Not on file   Other Topics Concern  . Not on file   Social History Narrative   Occupation: Sports coachQuality Consultant   Daily Caffeine use- 1 cup daily         Health Maintenance  Topic Date Due  . INFLUENZA VACCINE  06/11/2017 (Originally 04/30/2016)  . Hepatitis C Screening  06/11/2017 (Originally 06-28-1956)  . HIV Screening  06/11/2017 (Originally 09/14/1973)  . MAMMOGRAM  08/12/2017  . PAP SMEAR  08/12/2018  . COLONOSCOPY  10/25/2019  . TETANUS/TDAP  03/16/2024    The following portions of the patient's history were reviewed and updated as appropriate:  She  has a past medical history of Ankylosing spondylitis of multiple sites in spine (HCC); Arthritis; Gastritis; GERD (gastroesophageal reflux disease); Hiatal hernia; Hypertension; OSA (obstructive sleep apnea) (03/08/2016); and Thyroid nodule. She  does not have any pertinent problems on file. She  has a past surgical history that includes Esophagogastroduodenoscopy; Breast reduction surgery (1996); Total hip arthroplasty; Bariatric Surgery; and pelvis replacement (2010). Her family history includes Arthritis in her father; Breast cancer in her maternal grandmother; Diabetes in her father and sister; Hypertension in her father, mother, and sister; Melanoma in her maternal grandmother. She  reports that she has never smoked. She has never used smokeless tobacco. She  reports that she does not drink alcohol or use drugs. She has a current medication list which includes the following prescription(s): acetaminophen, biotin, brompheniramine-pseudoephedrine-dm, calcium carbonate, esomeprazole, fluocinolone acetonide scalp, ipratropium, multivitamin-iron-minerals-folic acid, one touch ultra test, phentermine-topiramate, potassium chloride, prednisone, and pyridoxine. Current Outpatient Prescriptions on File Prior to Visit  Medication Sig Dispense Refill  . Acetaminophen (TYLENOL EXTRA STRENGTH PO) Take by mouth.      . Biotin 5000 MCG TABS Take 1 tablet by mouth daily.    . brompheniramine-pseudoephedrine-DM 30-2-10 MG/5ML syrup Take 10 mLs by mouth 4 (four) times daily as needed. 120 mL 1  . calcium carbonate 200 MG capsule Take 250 mg by mouth 2 (two) times daily with a meal.      . FLUOCINOLONE ACETONIDE SCALP 0.01 % OIL as needed. Reported on 04/12/2016    . ipratropium (ATROVENT) 0.06 % nasal spray Place 2 sprays into both nostrils 4 (four) times daily. 15 mL 1  . multivitamin-iron-minerals-folic acid (CENTRUM) chewable tablet Chew 1 tablet by mouth daily.    . ONE TOUCH ULTRA TEST test strip USE ONCE DAILY 50 each 10  . Phentermine-Topiramate (QSYMIA) 7.5-46 MG CP24 Take by mouth. Reported on 04/12/2016    . POTASSIUM CHLORIDE CR PO Take 595 mg by mouth.     . predniSONE (DELTASONE) 5 MG tablet Take 5 mg by mouth as needed.    . pyridoxine (B-6) 100 MG tablet Take 100 mg by mouth daily.     No current  facility-administered medications on file prior to visit.   .  Review of Systems Review of Systems  Constitutional: Negative for activity change, appetite change and fatigue.  HENT: Negative for hearing loss, congestion, tinnitus and ear discharge.  dentist q71m Eyes: Negative for visual disturbance (see optho q1y -- vision corrected to 20/20 with glasses).  Respiratory: Negative for cough, chest tightness and shortness of breath.   Cardiovascular: Negative  for chest pain, palpitations and leg swelling.  Gastrointestinal: Negative for abdominal pain, diarrhea, constipation and abdominal distention.  Genitourinary: Negative for urgency, frequency, decreased urine volume and difficulty urinating.  Musculoskeletal: Negative for back pain, arthralgias and gait problem.  Skin: Negative for color change, pallor and rash.  Neurological: Negative for dizziness, light-headedness, numbness and headaches.  Hematological: Negative for adenopathy. Does not bruise/bleed easily.  Psychiatric/Behavioral: Negative for suicidal ideas, confusion, sleep disturbance, self-injury, dysphoric mood, decreased concentration and agitation.       Objective:    BP 123/84 (BP Location: Right Arm, Patient Position: Sitting, Cuff Size: Normal)   Pulse 74   Temp 97.8 F (36.6 C) (Oral)   Ht 5\' 1"  (1.549 m)   Wt 201 lb 6.4 oz (91.4 kg)   SpO2 98%   BMI 38.05 kg/m  General appearance: alert, cooperative, appears stated age and no distress Head: Normocephalic, without obvious abnormality, atraumatic Eyes: conjunctivae/corneas clear. PERRL, EOM's intact. Fundi benign. Ears: normal TM's and external ear canals both ears Nose: Nares normal. Septum midline. Mucosa normal. No drainage or sinus tenderness. Throat: lips, mucosa, and tongue normal; teeth and gums normal Neck: no adenopathy, no carotid bruit, no JVD, supple, symmetrical, trachea midline and thyroid not enlarged, symmetric, no tenderness/mass/nodules Back: symmetric, no curvature. ROM normal. No CVA tenderness. Lungs: clear to auscultation bilaterally Breasts: normal appearance, no masses or tenderness Heart: S1, S2 normal Abdomen: soft, non-tender; bowel sounds normal; no masses,  no organomegaly Pelvic: deferred Extremities: extremities normal, atraumatic, no cyanosis or edema Pulses: 2+ and symmetric Skin: Skin color, texture, turgor normal. No rashes or lesions Lymph nodes: Cervical, supraclavicular, and  axillary nodes normal. Neurologic: Alert and oriented X 3, normal strength and tone. Normal symmetric reflexes. Normal coordination and gait    Assessment:    Healthy female exam.      Plan:    ghm utd Check labs See After Visit Summary for Counseling Recommendations    1. Chest pain, unspecified chest pain type Pain resolved with GI cocktail  - EKG 12-Lead  2. Preventative health care See above - CBC with Differential/Platelet - Comprehensive metabolic panel - Lipid panel - POCT urinalysis dipstick  3. Essential hypertension stable - CBC with Differential/Platelet - Comprehensive metabolic panel - Lipid panel - POCT urinalysis dipstick  4. Gastroesophageal reflux disease, esophagitis presence not specified Chest pain resolved with GI cocktail F/u bariatric surgeon to see if egd needed per gi -- see last note - esomeprazole (NEXIUM) 40 MG capsule; Take 1 capsule (40 mg total) by mouth 2 (two) times daily before a meal.  Dispense: 180 capsule; Refill: 3  5. Hyperglycemia   - Hemoglobin A1c

## 2016-06-11 NOTE — Patient Instructions (Signed)
Preventive Care for Adults, Female A healthy lifestyle and preventive care can promote health and wellness. Preventive health guidelines for women include the following key practices.  A routine yearly physical is a good way to check with your health care provider about your health and preventive screening. It is a chance to share any concerns and updates on your health and to receive a thorough exam.  Visit your dentist for a routine exam and preventive care every 6 months. Brush your teeth twice a day and floss once a day. Good oral hygiene prevents tooth decay and gum disease.  The frequency of eye exams is based on your age, health, family medical history, use of contact lenses, and other factors. Follow your health care provider's recommendations for frequency of eye exams.  Eat a healthy diet. Foods like vegetables, fruits, whole grains, low-fat dairy products, and lean protein foods contain the nutrients you need without too many calories. Decrease your intake of foods high in solid fats, added sugars, and salt. Eat the right amount of calories for you.Get information about a proper diet from your health care provider, if necessary.  Regular physical exercise is one of the most important things you can do for your health. Most adults should get at least 150 minutes of moderate-intensity exercise (any activity that increases your heart rate and causes you to sweat) each week. In addition, most adults need muscle-strengthening exercises on 2 or more days a week.  Maintain a healthy weight. The body mass index (BMI) is a screening tool to identify possible weight problems. It provides an estimate of body fat based on height and weight. Your health care provider can find your BMI and can help you achieve or maintain a healthy weight.For adults 20 years and older:  A BMI below 18.5 is considered underweight.  A BMI of 18.5 to 24.9 is normal.  A BMI of 25 to 29.9 is considered overweight.  A  BMI of 30 and above is considered obese.  Maintain normal blood lipids and cholesterol levels by exercising and minimizing your intake of saturated fat. Eat a balanced diet with plenty of fruit and vegetables. Blood tests for lipids and cholesterol should begin at age 14 and be repeated every 5 years. If your lipid or cholesterol levels are high, you are over 50, or you are at high risk for heart disease, you may need your cholesterol levels checked more frequently.Ongoing high lipid and cholesterol levels should be treated with medicines if diet and exercise are not working.  If you smoke, find out from your health care provider how to quit. If you do not use tobacco, do not start.  Lung cancer screening is recommended for adults aged 58-80 years who are at high risk for developing lung cancer because of a history of smoking. A yearly low-dose CT scan of the lungs is recommended for people who have at least a 30-pack-year history of smoking and are a current smoker or have quit within the past 15 years. A pack year of smoking is smoking an average of 1 pack of cigarettes a day for 1 year (for example: 1 pack a day for 30 years or 2 packs a day for 15 years). Yearly screening should continue until the smoker has stopped smoking for at least 15 years. Yearly screening should be stopped for people who develop a health problem that would prevent them from having lung cancer treatment.  If you are pregnant, do not drink alcohol. If you are  breastfeeding, be very cautious about drinking alcohol. If you are not pregnant and choose to drink alcohol, do not have more than 1 drink per day. One drink is considered to be 12 ounces (355 mL) of beer, 5 ounces (148 mL) of wine, or 1.5 ounces (44 mL) of liquor.  Avoid use of street drugs. Do not share needles with anyone. Ask for help if you need support or instructions about stopping the use of drugs.  High blood pressure causes heart disease and increases the risk  of stroke. Your blood pressure should be checked at least every 1 to 2 years. Ongoing high blood pressure should be treated with medicines if weight loss and exercise do not work.  If you are 64-80 years old, ask your health care provider if you should take aspirin to prevent strokes.  Diabetes screening is done by taking a blood sample to check your blood glucose level after you have not eaten for a certain period of time (fasting). If you are not overweight and you do not have risk factors for diabetes, you should be screened once every 3 years starting at age 19. If you are overweight or obese and you are 22-21 years of age, you should be screened for diabetes every year as part of your cardiovascular risk assessment.  Breast cancer screening is essential preventive care for women. You should practice "breast self-awareness." This means understanding the normal appearance and feel of your breasts and may include breast self-examination. Any changes detected, no matter how small, should be reported to a health care provider. Women in their 85s and 30s should have a clinical breast exam (CBE) by a health care provider as part of a regular health exam every 1 to 3 years. After age 80, women should have a CBE every year. Starting at age 75, women should consider having a mammogram (breast X-ray test) every year. Women who have a family history of breast cancer should talk to their health care provider about genetic screening. Women at a high risk of breast cancer should talk to their health care providers about having an MRI and a mammogram every year.  Breast cancer gene (BRCA)-related cancer risk assessment is recommended for women who have family members with BRCA-related cancers. BRCA-related cancers include breast, ovarian, tubal, and peritoneal cancers. Having family members with these cancers may be associated with an increased risk for harmful changes (mutations) in the breast cancer genes BRCA1 and  BRCA2. Results of the assessment will determine the need for genetic counseling and BRCA1 and BRCA2 testing.  Your health care provider may recommend that you be screened regularly for cancer of the pelvic organs (ovaries, uterus, and vagina). This screening involves a pelvic examination, including checking for microscopic changes to the surface of your cervix (Pap test). You may be encouraged to have this screening done every 3 years, beginning at age 36.  For women ages 64-65, health care providers may recommend pelvic exams and Pap testing every 3 years, or they may recommend the Pap and pelvic exam, combined with testing for human papilloma virus (HPV), every 5 years. Some types of HPV increase your risk of cervical cancer. Testing for HPV may also be done on women of any age with unclear Pap test results.  Other health care providers may not recommend any screening for nonpregnant women who are considered low risk for pelvic cancer and who do not have symptoms. Ask your health care provider if a screening pelvic exam is right for  you.  If you have had past treatment for cervical cancer or a condition that could lead to cancer, you need Pap tests and screening for cancer for at least 20 years after your treatment. If Pap tests have been discontinued, your risk factors (such as having a new sexual partner) need to be reassessed to determine if screening should resume. Some women have medical problems that increase the chance of getting cervical cancer. In these cases, your health care provider may recommend more frequent screening and Pap tests.  Colorectal cancer can be detected and often prevented. Most routine colorectal cancer screening begins at the age of 50 years and continues through age 75 years. However, your health care provider may recommend screening at an earlier age if you have risk factors for colon cancer. On a yearly basis, your health care provider may provide home test kits to check  for hidden blood in the stool. Use of a small camera at the end of a tube, to directly examine the colon (sigmoidoscopy or colonoscopy), can detect the earliest forms of colorectal cancer. Talk to your health care provider about this at age 50, when routine screening begins. Direct exam of the colon should be repeated every 5-10 years through age 75 years, unless early forms of precancerous polyps or small growths are found.  People who are at an increased risk for hepatitis B should be screened for this virus. You are considered at high risk for hepatitis B if:  You were born in a country where hepatitis B occurs often. Talk with your health care provider about which countries are considered high risk.  Your parents were born in a high-risk country and you have not received a shot to protect against hepatitis B (hepatitis B vaccine).  You have HIV or AIDS.  You use needles to inject street drugs.  You live with, or have sex with, someone who has hepatitis B.  You get hemodialysis treatment.  You take certain medicines for conditions like cancer, organ transplantation, and autoimmune conditions.  Hepatitis C blood testing is recommended for all people born from 1945 through 1965 and any individual with known risks for hepatitis C.  Practice safe sex. Use condoms and avoid high-risk sexual practices to reduce the spread of sexually transmitted infections (STIs). STIs include gonorrhea, chlamydia, syphilis, trichomonas, herpes, HPV, and human immunodeficiency virus (HIV). Herpes, HIV, and HPV are viral illnesses that have no cure. They can result in disability, cancer, and death.  You should be screened for sexually transmitted illnesses (STIs) including gonorrhea and chlamydia if:  You are sexually active and are younger than 24 years.  You are older than 24 years and your health care provider tells you that you are at risk for this type of infection.  Your sexual activity has changed  since you were last screened and you are at an increased risk for chlamydia or gonorrhea. Ask your health care provider if you are at risk.  If you are at risk of being infected with HIV, it is recommended that you take a prescription medicine daily to prevent HIV infection. This is called preexposure prophylaxis (PrEP). You are considered at risk if:  You are sexually active and do not regularly use condoms or know the HIV status of your partner(s).  You take drugs by injection.  You are sexually active with a partner who has HIV.  Talk with your health care provider about whether you are at high risk of being infected with HIV. If   you choose to begin PrEP, you should first be tested for HIV. You should then be tested every 3 months for as long as you are taking PrEP.  Osteoporosis is a disease in which the bones lose minerals and strength with aging. This can result in serious bone fractures or breaks. The risk of osteoporosis can be identified using a bone density scan. Women ages 67 years and over and women at risk for fractures or osteoporosis should discuss screening with their health care providers. Ask your health care provider whether you should take a calcium supplement or vitamin D to reduce the rate of osteoporosis.  Menopause can be associated with physical symptoms and risks. Hormone replacement therapy is available to decrease symptoms and risks. You should talk to your health care provider about whether hormone replacement therapy is right for you.  Use sunscreen. Apply sunscreen liberally and repeatedly throughout the day. You should seek shade when your shadow is shorter than you. Protect yourself by wearing long sleeves, pants, a wide-brimmed hat, and sunglasses year round, whenever you are outdoors.  Once a month, do a whole body skin exam, using a mirror to look at the skin on your back. Tell your health care provider of new moles, moles that have irregular borders, moles that  are larger than a pencil eraser, or moles that have changed in shape or color.  Stay current with required vaccines (immunizations).  Influenza vaccine. All adults should be immunized every year.  Tetanus, diphtheria, and acellular pertussis (Td, Tdap) vaccine. Pregnant women should receive 1 dose of Tdap vaccine during each pregnancy. The dose should be obtained regardless of the length of time since the last dose. Immunization is preferred during the 27th-36th week of gestation. An adult who has not previously received Tdap or who does not know her vaccine status should receive 1 dose of Tdap. This initial dose should be followed by tetanus and diphtheria toxoids (Td) booster doses every 10 years. Adults with an unknown or incomplete history of completing a 3-dose immunization series with Td-containing vaccines should begin or complete a primary immunization series including a Tdap dose. Adults should receive a Td booster every 10 years.  Varicella vaccine. An adult without evidence of immunity to varicella should receive 2 doses or a second dose if she has previously received 1 dose. Pregnant females who do not have evidence of immunity should receive the first dose after pregnancy. This first dose should be obtained before leaving the health care facility. The second dose should be obtained 4-8 weeks after the first dose.  Human papillomavirus (HPV) vaccine. Females aged 13-26 years who have not received the vaccine previously should obtain the 3-dose series. The vaccine is not recommended for use in pregnant females. However, pregnancy testing is not needed before receiving a dose. If a female is found to be pregnant after receiving a dose, no treatment is needed. In that case, the remaining doses should be delayed until after the pregnancy. Immunization is recommended for any person with an immunocompromised condition through the age of 61 years if she did not get any or all doses earlier. During the  3-dose series, the second dose should be obtained 4-8 weeks after the first dose. The third dose should be obtained 24 weeks after the first dose and 16 weeks after the second dose.  Zoster vaccine. One dose is recommended for adults aged 30 years or older unless certain conditions are present.  Measles, mumps, and rubella (MMR) vaccine. Adults born  before 1957 generally are considered immune to measles and mumps. Adults born in 1957 or later should have 1 or more doses of MMR vaccine unless there is a contraindication to the vaccine or there is laboratory evidence of immunity to each of the three diseases. A routine second dose of MMR vaccine should be obtained at least 28 days after the first dose for students attending postsecondary schools, health care workers, or international travelers. People who received inactivated measles vaccine or an unknown type of measles vaccine during 1963-1967 should receive 2 doses of MMR vaccine. People who received inactivated mumps vaccine or an unknown type of mumps vaccine before 1979 and are at high risk for mumps infection should consider immunization with 2 doses of MMR vaccine. For females of childbearing age, rubella immunity should be determined. If there is no evidence of immunity, females who are not pregnant should be vaccinated. If there is no evidence of immunity, females who are pregnant should delay immunization until after pregnancy. Unvaccinated health care workers born before 1957 who lack laboratory evidence of measles, mumps, or rubella immunity or laboratory confirmation of disease should consider measles and mumps immunization with 2 doses of MMR vaccine or rubella immunization with 1 dose of MMR vaccine.  Pneumococcal 13-valent conjugate (PCV13) vaccine. When indicated, a person who is uncertain of his immunization history and has no record of immunization should receive the PCV13 vaccine. All adults 65 years of age and older should receive this  vaccine. An adult aged 19 years or older who has certain medical conditions and has not been previously immunized should receive 1 dose of PCV13 vaccine. This PCV13 should be followed with a dose of pneumococcal polysaccharide (PPSV23) vaccine. Adults who are at high risk for pneumococcal disease should obtain the PPSV23 vaccine at least 8 weeks after the dose of PCV13 vaccine. Adults older than 58 years of age who have normal immune system function should obtain the PPSV23 vaccine dose at least 1 year after the dose of PCV13 vaccine.  Pneumococcal polysaccharide (PPSV23) vaccine. When PCV13 is also indicated, PCV13 should be obtained first. All adults aged 65 years and older should be immunized. An adult younger than age 65 years who has certain medical conditions should be immunized. Any person who resides in a nursing home or long-term care facility should be immunized. An adult smoker should be immunized. People with an immunocompromised condition and certain other conditions should receive both PCV13 and PPSV23 vaccines. People with human immunodeficiency virus (HIV) infection should be immunized as soon as possible after diagnosis. Immunization during chemotherapy or radiation therapy should be avoided. Routine use of PPSV23 vaccine is not recommended for American Indians, [place] Natives, or people younger than 65 years unless there are medical conditions that require PPSV23 vaccine. When indicated, people who have unknown immunization and have no record of immunization should receive PPSV23 vaccine. One-time revaccination 5 years after the first dose of PPSV23 is recommended for people aged 19-64 years who have chronic kidney failure, nephrotic syndrome, asplenia, or immunocompromised conditions. People who received 1-2 doses of PPSV23 before age 65 years should receive another dose of PPSV23 vaccine at age 65 years or later if at least 5 years have passed since the previous dose. Doses of PPSV23 are not  needed for people immunized with PPSV23 at or after age 65 years.  Meningococcal vaccine. Adults with asplenia or persistent complement component deficiencies should receive 2 doses of quadrivalent meningococcal conjugate (MenACWY-D) vaccine. The doses should be obtained   at least 2 months apart. Microbiologists working with certain meningococcal bacteria, [place] recruits, people at risk during an outbreak, and people who travel to or live in countries with a high rate of meningitis should be immunized. A first-year college student up through age 34 years who is living in a residence hall should receive a dose if she did not receive a dose on or after her 16th birthday. Adults who have certain high-risk conditions should receive one or more doses of vaccine.  Hepatitis A vaccine. Adults who wish to be protected from this disease, have certain high-risk conditions, work with hepatitis A-infected animals, work in hepatitis A research labs, or travel to or work in countries with a high rate of hepatitis A should be immunized. Adults who were previously unvaccinated and who anticipate close contact with an international adoptee during the first 60 days after arrival in the [place] from a country with a high rate of hepatitis A should be immunized.  Hepatitis B vaccine. Adults who wish to be protected from this disease, have certain high-risk conditions, may be exposed to blood or other infectious body fluids, are household contacts or sex partners of hepatitis B positive people, are clients or workers in certain care facilities, or travel to or work in countries with a high rate of hepatitis B should be immunized.  Haemophilus influenzae type b (Hib) vaccine. A previously unvaccinated person with asplenia or sickle cell disease or having a scheduled splenectomy should receive 1 dose of Hib vaccine. Regardless of previous immunization, a recipient of a hematopoietic stem cell transplant should receive a  3-dose series 6-12 months after her successful transplant. Hib vaccine is not recommended for adults with HIV infection. Preventive Services / Frequency Ages 35 to 4 years  Blood pressure check.** / Every 3-5 years.  Lipid and cholesterol check.** / Every 5 years beginning at age 60.  Clinical breast exam.** / Every 3 years for women in their 71s and 10s.  BRCA-related cancer risk assessment.** / For women who have family members with a BRCA-related cancer (breast, ovarian, tubal, or peritoneal cancers).  Pap test.** / Every 2 years from ages 76 through 26. Every 3 years starting at age 61 through age 76 or 93 with a history of 3 consecutive normal Pap tests.  HPV screening.** / Every 3 years from ages 37 through ages 60 to 51 with a history of 3 consecutive normal Pap tests.  Hepatitis C blood test.** / For any individual with known risks for hepatitis C.  Skin self-exam. / Monthly.  Influenza vaccine. / Every year.  Tetanus, diphtheria, and acellular pertussis (Tdap, Td) vaccine.** / Consult your health care provider. Pregnant women should receive 1 dose of Tdap vaccine during each pregnancy. 1 dose of Td every 10 years.  Varicella vaccine.** / Consult your health care provider. Pregnant females who do not have evidence of immunity should receive the first dose after pregnancy.  HPV vaccine. / 3 doses over 6 months, if 93 and younger. The vaccine is not recommended for use in pregnant females. However, pregnancy testing is not needed before receiving a dose.  Measles, mumps, rubella (MMR) vaccine.** / You need at least 1 dose of MMR if you were born in 1957 or later. You may also need a 2nd dose. For females of childbearing age, rubella immunity should be determined. If there is no evidence of immunity, females who are not pregnant should be vaccinated. If there is no evidence of immunity, females who are  pregnant should delay immunization until after pregnancy.  Pneumococcal  13-valent conjugate (PCV13) vaccine.** / Consult your health care provider.  Pneumococcal polysaccharide (PPSV23) vaccine.** / 1 to 2 doses if you smoke cigarettes or if you have certain conditions.  Meningococcal vaccine.** / 1 dose if you are age 21 to 36 years and a Market researcher living in a residence hall, or have one of several medical conditions, you need to get vaccinated against meningococcal disease. You may also need additional booster doses.  Hepatitis A vaccine.** / Consult your health care provider.  Hepatitis B vaccine.** / Consult your health care provider.  Haemophilus influenzae type b (Hib) vaccine.** / Consult your health care provider. Ages 74 to 51 years  Blood pressure check.** / Every year.  Lipid and cholesterol check.** / Every 5 years beginning at age 72 years.  Lung cancer screening. / Every year if you are aged 69-80 years and have a 30-pack-year history of smoking and currently smoke or have quit within the past 15 years. Yearly screening is stopped once you have quit smoking for at least 15 years or develop a health problem that would prevent you from having lung cancer treatment.  Clinical breast exam.** / Every year after age 74 years.  BRCA-related cancer risk assessment.** / For women who have family members with a BRCA-related cancer (breast, ovarian, tubal, or peritoneal cancers).  Mammogram.** / Every year beginning at age 47 years and continuing for as long as you are in good health. Consult with your health care provider.  Pap test.** / Every 3 years starting at age 77 years through age 18 or 69 years with a history of 3 consecutive normal Pap tests.  HPV screening.** / Every 3 years from ages 40 years through ages 84 to 54 years with a history of 3 consecutive normal Pap tests.  Fecal occult blood test (FOBT) of stool. / Every year beginning at age 73 years and continuing until age 31 years. You may not need to do this test if you get  a colonoscopy every 10 years.  Flexible sigmoidoscopy or colonoscopy.** / Every 5 years for a flexible sigmoidoscopy or every 10 years for a colonoscopy beginning at age 60 years and continuing until age 3 years.  Hepatitis C blood test.** / For all people born from [birth year] through 1963 and any individual with known risks for hepatitis C.  Skin self-exam. / Monthly.  Influenza vaccine. / Every year.  Tetanus, diphtheria, and acellular pertussis (Tdap/Td) vaccine.** / Consult your health care provider. Pregnant women should receive 1 dose of Tdap vaccine during each pregnancy. 1 dose of Td every 10 years.  Varicella vaccine.** / Consult your health care provider. Pregnant females who do not have evidence of immunity should receive the first dose after pregnancy.  Zoster vaccine.** / 1 dose for adults aged 36 years or older.  Measles, mumps, rubella (MMR) vaccine.** / You need at least 1 dose of MMR if you were born in 1957 or later. You may also need a second dose. For females of childbearing age, rubella immunity should be determined. If there is no evidence of immunity, females who are not pregnant should be vaccinated. If there is no evidence of immunity, females who are pregnant should delay immunization until after pregnancy.  Pneumococcal 13-valent conjugate (PCV13) vaccine.** / Consult your health care provider.  Pneumococcal polysaccharide (PPSV23) vaccine.** / 1 to 2 doses if you smoke cigarettes or if you have certain conditions.  Meningococcal vaccine.** /  Consult your health care provider.  Hepatitis A vaccine.** / Consult your health care provider.  Hepatitis B vaccine.** / Consult your health care provider.  Haemophilus influenzae type b (Hib) vaccine.** / Consult your health care provider. Ages 64 years and over  Blood pressure check.** / Every year.  Lipid and cholesterol check.** / Every 5 years beginning at age 23 years.  Lung cancer screening. / Every year if you  are aged 16-80 years and have a 30-pack-year history of smoking and currently smoke or have quit within the past 15 years. Yearly screening is stopped once you have quit smoking for at least 15 years or develop a health problem that would prevent you from having lung cancer treatment.  Clinical breast exam.** / Every year after age 74 years.  BRCA-related cancer risk assessment.** / For women who have family members with a BRCA-related cancer (breast, ovarian, tubal, or peritoneal cancers).  Mammogram.** / Every year beginning at age 44 years and continuing for as long as you are in good health. Consult with your health care provider.  Pap test.** / Every 3 years starting at age 58 years through age 22 or 39 years with 3 consecutive normal Pap tests. Testing can be stopped between 65 and 70 years with 3 consecutive normal Pap tests and no abnormal Pap or HPV tests in the past 10 years.  HPV screening.** / Every 3 years from ages 64 years through ages 70 or 61 years with a history of 3 consecutive normal Pap tests. Testing can be stopped between 65 and 70 years with 3 consecutive normal Pap tests and no abnormal Pap or HPV tests in the past 10 years.  Fecal occult blood test (FOBT) of stool. / Every year beginning at age 40 years and continuing until age 27 years. You may not need to do this test if you get a colonoscopy every 10 years.  Flexible sigmoidoscopy or colonoscopy.** / Every 5 years for a flexible sigmoidoscopy or every 10 years for a colonoscopy beginning at age 7 years and continuing until age 32 years.  Hepatitis C blood test.** / For all people born from [birth year] through 1963 and any individual with known risks for hepatitis C.  Osteoporosis screening.** / A one-time screening for women ages 30 years and over and women at risk for fractures or osteoporosis.  Skin self-exam. / Monthly.  Influenza vaccine. / Every year.  Tetanus, diphtheria, and acellular pertussis (Tdap/Td)  vaccine.** / 1 dose of Td every 10 years.  Varicella vaccine.** / Consult your health care provider.  Zoster vaccine.** / 1 dose for adults aged 35 years or older.  Pneumococcal 13-valent conjugate (PCV13) vaccine.** / Consult your health care provider.  Pneumococcal polysaccharide (PPSV23) vaccine.** / 1 dose for all adults aged 46 years and older.  Meningococcal vaccine.** / Consult your health care provider.  Hepatitis A vaccine.** / Consult your health care provider.  Hepatitis B vaccine.** / Consult your health care provider.  Haemophilus influenzae type b (Hib) vaccine.** / Consult your health care provider. ** Family history and personal history of risk and conditions may change your health care provider's recommendations.   This information is not intended to replace advice given to you by your health care provider. Make sure you discuss any questions you have with your health care provider.   Document Released: 11/12/2001 Document Revised: 10/07/2014 Document Reviewed: 02/11/2011 Elsevier Interactive Patient Education [employer].

## 2016-06-11 NOTE — Progress Notes (Signed)
Pre visit review using our clinic review tool, if applicable. No additional management support is needed unless otherwise documented below in the visit note. 

## 2016-06-24 ENCOUNTER — Other Ambulatory Visit (INDEPENDENT_AMBULATORY_CARE_PROVIDER_SITE_OTHER): Payer: Medicare Other

## 2016-06-24 ENCOUNTER — Ambulatory Visit (INDEPENDENT_AMBULATORY_CARE_PROVIDER_SITE_OTHER): Payer: Medicare Other | Admitting: Family Medicine

## 2016-06-24 DIAGNOSIS — Z0000 Encounter for general adult medical examination without abnormal findings: Secondary | ICD-10-CM

## 2016-06-24 DIAGNOSIS — E876 Hypokalemia: Secondary | ICD-10-CM | POA: Diagnosis not present

## 2016-06-24 LAB — BASIC METABOLIC PANEL
BUN: 11 mg/dL (ref 6–23)
CHLORIDE: 104 meq/L (ref 96–112)
CO2: 29 meq/L (ref 19–32)
Calcium: 8.8 mg/dL (ref 8.4–10.5)
Creatinine, Ser: 0.74 mg/dL (ref 0.40–1.20)
GFR: 103.75 mL/min (ref >60.00)
GLUCOSE: 84 mg/dL (ref 70–99)
POTASSIUM: 4.7 meq/L (ref 3.5–5.1)
Sodium: 139 mEq/L (ref 135–145)

## 2016-06-24 NOTE — Progress Notes (Signed)
Pre visit review using our clinic review tool, if applicable. No additional management support is needed unless otherwise documented below in the visit note. 

## 2016-06-26 ENCOUNTER — Ambulatory Visit: Payer: Medicare Other | Admitting: Pulmonary Disease

## 2016-08-06 DIAGNOSIS — Z9884 Bariatric surgery status: Secondary | ICD-10-CM | POA: Diagnosis not present

## 2016-08-06 DIAGNOSIS — R1013 Epigastric pain: Secondary | ICD-10-CM | POA: Diagnosis not present

## 2016-08-12 DIAGNOSIS — E559 Vitamin D deficiency, unspecified: Secondary | ICD-10-CM | POA: Diagnosis not present

## 2016-08-12 DIAGNOSIS — E039 Hypothyroidism, unspecified: Secondary | ICD-10-CM | POA: Diagnosis not present

## 2016-08-12 DIAGNOSIS — N958 Other specified menopausal and perimenopausal disorders: Secondary | ICD-10-CM | POA: Diagnosis not present

## 2016-08-12 DIAGNOSIS — D519 Vitamin B12 deficiency anemia, unspecified: Secondary | ICD-10-CM | POA: Diagnosis not present

## 2016-08-12 DIAGNOSIS — Z8639 Personal history of other endocrine, nutritional and metabolic disease: Secondary | ICD-10-CM | POA: Diagnosis not present

## 2016-08-12 DIAGNOSIS — Z13820 Encounter for screening for osteoporosis: Secondary | ICD-10-CM | POA: Diagnosis not present

## 2016-08-12 DIAGNOSIS — Z1231 Encounter for screening mammogram for malignant neoplasm of breast: Secondary | ICD-10-CM | POA: Diagnosis not present

## 2016-08-12 DIAGNOSIS — Z01419 Encounter for gynecological examination (general) (routine) without abnormal findings: Secondary | ICD-10-CM | POA: Diagnosis not present

## 2016-08-29 DIAGNOSIS — M79605 Pain in left leg: Secondary | ICD-10-CM | POA: Diagnosis not present

## 2016-10-21 ENCOUNTER — Ambulatory Visit: Payer: Medicare Other | Admitting: Gastroenterology

## 2016-11-13 DIAGNOSIS — L723 Sebaceous cyst: Secondary | ICD-10-CM | POA: Diagnosis not present

## 2016-11-13 DIAGNOSIS — Z833 Family history of diabetes mellitus: Secondary | ICD-10-CM | POA: Diagnosis not present

## 2016-11-13 DIAGNOSIS — E559 Vitamin D deficiency, unspecified: Secondary | ICD-10-CM | POA: Diagnosis not present

## 2016-11-20 ENCOUNTER — Ambulatory Visit: Payer: Medicare Other | Admitting: Gastroenterology

## 2016-12-09 ENCOUNTER — Ambulatory Visit: Payer: Medicare Other | Admitting: Family Medicine

## 2017-01-16 ENCOUNTER — Other Ambulatory Visit: Payer: Self-pay | Admitting: Gastroenterology

## 2017-03-11 DIAGNOSIS — Z9884 Bariatric surgery status: Secondary | ICD-10-CM | POA: Diagnosis not present

## 2017-03-11 DIAGNOSIS — Z8679 Personal history of other diseases of the circulatory system: Secondary | ICD-10-CM | POA: Diagnosis not present

## 2017-03-11 DIAGNOSIS — Z79899 Other long term (current) drug therapy: Secondary | ICD-10-CM | POA: Diagnosis not present

## 2017-03-11 DIAGNOSIS — E041 Nontoxic single thyroid nodule: Secondary | ICD-10-CM | POA: Diagnosis not present

## 2017-03-11 DIAGNOSIS — G4733 Obstructive sleep apnea (adult) (pediatric): Secondary | ICD-10-CM | POA: Diagnosis not present

## 2017-03-11 DIAGNOSIS — Z8249 Family history of ischemic heart disease and other diseases of the circulatory system: Secondary | ICD-10-CM | POA: Diagnosis not present

## 2017-03-18 DIAGNOSIS — Z9884 Bariatric surgery status: Secondary | ICD-10-CM | POA: Diagnosis not present

## 2017-05-22 ENCOUNTER — Telehealth: Payer: Self-pay | Admitting: Family Medicine

## 2017-05-22 ENCOUNTER — Ambulatory Visit: Payer: Medicare Other | Admitting: Family Medicine

## 2017-05-22 NOTE — Telephone Encounter (Signed)
Patient cancelled her 11am appointment stating she does not feel well, patient [name] to Thursday 05/29/17 and would like $50 no show fee waived, charge or no charge

## 2017-05-23 NOTE — Telephone Encounter (Signed)
No charge. 

## 2017-05-29 ENCOUNTER — Ambulatory Visit (INDEPENDENT_AMBULATORY_CARE_PROVIDER_SITE_OTHER): Payer: Medicare Other | Admitting: Family Medicine

## 2017-05-29 ENCOUNTER — Encounter: Payer: Self-pay | Admitting: Family Medicine

## 2017-05-29 VITALS — BP 136/84 | HR 77 | Temp 98.8°F | Wt 196.8 lb

## 2017-05-29 DIAGNOSIS — M459 Ankylosing spondylitis of unspecified sites in spine: Secondary | ICD-10-CM

## 2017-05-29 NOTE — Progress Notes (Signed)
Patient ID: Courtney GeneralBeverly L Moldovan, female    DOB: 09-07-1956  Age: 61 y.o. MRN: [id number]    Subjective:  Subjective  HPI [name] presents for referral for rheumatology.  Her rheumatologist retired and she needs a new one.  No other complaints.    Review of Systems  Constitutional: Negative for activity change, appetite change, fatigue and unexpected weight change.  Respiratory: Negative for cough and shortness of breath.   Cardiovascular: Negative for chest pain and palpitations.  Musculoskeletal: Positive for arthralgias. Negative for myalgias.  Psychiatric/Behavioral: Negative for behavioral problems and dysphoric mood. The patient is not nervous/anxious.     History Past Medical History:  Diagnosis Date  . Ankylosing spondylitis of multiple sites in spine (HCC)   . Arthritis   . Gastritis   . GERD (gastroesophageal reflux disease)   . Hiatal hernia   . Hypertension   . OSA (obstructive sleep apnea) 03/08/2016  . Thyroid nodule     She has a past surgical history that includes Esophagogastroduodenoscopy; Breast reduction surgery (1996); Total hip arthroplasty; Bariatric Surgery; and pelvis replacement (2010).   Her family history includes Arthritis in her father; Breast cancer in her maternal grandmother; Diabetes in her father and sister; Hypertension in her father, mother, and sister; Melanoma in her maternal grandmother.She reports that she has never smoked. She has never used smokeless tobacco. She reports that she does not drink alcohol or use drugs.  Current Outpatient Prescriptions on File Prior to Visit  Medication Sig Dispense Refill  . Acetaminophen (TYLENOL EXTRA STRENGTH PO) Take by mouth.      . Biotin 5000 MCG TABS Take 1 tablet by mouth daily.    . brompheniramine-pseudoephedrine-DM 30-2-10 MG/5ML syrup Take 10 mLs by mouth 4 (four) times daily as needed. 120 mL 1  . calcium carbonate 200 MG capsule Take 250 mg by mouth 2 (two) times daily with a meal.      .  esomeprazole (NEXIUM) 40 MG capsule Take 1 capsule (40 mg total) by mouth 2 (two) times daily before a meal. 180 capsule 3  . FLUOCINOLONE ACETONIDE SCALP 0.01 % OIL as needed. Reported on 04/12/2016    . ipratropium (ATROVENT) 0.06 % nasal spray Place 2 sprays into both nostrils 4 (four) times daily. 15 mL 1  . multivitamin-iron-minerals-folic acid (CENTRUM) chewable tablet Chew 1 tablet by mouth daily.    Marland Kitchen. NEXIUM 40 MG capsule TAKE 1 CAPSULE (40 MG TOTAL) BY MOUTH DAILY. 90 capsule 1  . ONE TOUCH ULTRA TEST test strip USE ONCE DAILY 50 each 10  . Phentermine-Topiramate (QSYMIA) 7.5-46 MG CP24 Take by mouth. Reported on 04/12/2016    . POTASSIUM CHLORIDE CR PO Take 595 mg by mouth.     . predniSONE (DELTASONE) 5 MG tablet Take 5 mg by mouth as needed.    . pyridoxine (B-6) 100 MG tablet Take 100 mg by mouth daily.     No current facility-administered medications on file prior to visit.      Objective:  Objective  Physical Exam  Constitutional: She is oriented to person, place, and time. She appears well-developed and well-nourished.  HENT:  Head: Normocephalic and atraumatic.  Eyes: Conjunctivae and EOM are normal.  Neck: Normal range of motion. Neck supple. No JVD present. Carotid bruit is not present. No thyromegaly present.  Cardiovascular: Normal rate, regular rhythm and normal heart sounds.   No murmur heard. Pulmonary/Chest: Effort normal and breath sounds normal. No respiratory distress. She has no wheezes. She  has no rales. She exhibits no tenderness.  Musculoskeletal: She exhibits no edema.  Neurological: She is alert and oriented to person, place, and time.  Psychiatric: She has a normal mood and affect.  Nursing note and vitals reviewed.  BP 136/84 (BP Location: Right Arm, Patient Position: Sitting, Cuff Size: Normal)   Pulse 77   Temp 98.8 F (37.1 C) (Oral)   Wt 196 lb 12.8 oz (89.3 kg)   SpO2 99%   BMI 37.19 kg/m  Wt Readings from Last 3 Encounters:  05/29/17 196  lb 12.8 oz (89.3 kg)  06/11/16 201 lb 6.4 oz (91.4 kg)  04/12/16 201 lb (91.2 kg)     Lab Results  Component Value Date   WBC 7.7 06/11/2016   HGB 13.5 06/11/2016   HCT 40.0 06/11/2016   PLT 361.0 06/11/2016   GLUCOSE 84 06/24/2016   CHOL 171 06/11/2016   TRIG 60.0 06/11/2016   HDL 94.50 06/11/2016   LDLCALC 65 06/11/2016   ALT 19 06/11/2016   AST 23 06/11/2016   NA 139 06/24/2016   K 4.7 06/24/2016   CL 104 06/24/2016   CREATININE 0.74 06/24/2016   BUN 11 06/24/2016   CO2 29 06/24/2016   TSH 1.63 05/11/2015   INR 1.2 09/26/2008   HGBA1C 5.5 06/11/2016    No results found.   Assessment & Plan:  Plan  I am having Ms. Ziesmer maintain her ONE TOUCH ULTRA TEST, calcium carbonate, POTASSIUM CHLORIDE CR PO, Acetaminophen (TYLENOL EXTRA STRENGTH PO), multivitamin-iron-minerals-folic acid, Biotin, predniSONE, pyridoxine, Fluocinolone Acetonide Scalp, Phentermine-Topiramate, ipratropium, brompheniramine-pseudoephedrine-DM, esomeprazole, NEXIUM, ferrous sulfate, and sucralfate.  Meds ordered this encounter  Medications  . ferrous sulfate 325 (65 FE) MG EC tablet    Sig: Take 1 tablet by mouth daily.  . sucralfate (CARAFATE) 1 g tablet    Sig: Take 1 tablet by mouth daily.    Problem List Items Addressed This Visit      Unprioritized   ANKYLOSING SPONDYLITIS - Primary   Relevant Orders   Ambulatory referral to Rheumatology      Follow-up: Return if symptoms worsen or fail to improve.  Donato Schultz, DO

## 2017-08-08 DIAGNOSIS — H2000 Unspecified acute and subacute iridocyclitis: Secondary | ICD-10-CM | POA: Diagnosis not present

## 2017-08-29 ENCOUNTER — Ambulatory Visit: Payer: Medicare Other

## 2017-09-18 ENCOUNTER — Other Ambulatory Visit (HOSPITAL_COMMUNITY): Payer: Self-pay | Admitting: *Deleted

## 2017-09-18 DIAGNOSIS — I10 Essential (primary) hypertension: Secondary | ICD-10-CM

## 2017-10-06 DIAGNOSIS — R002 Palpitations: Secondary | ICD-10-CM | POA: Diagnosis not present

## 2017-10-06 DIAGNOSIS — E079 Disorder of thyroid, unspecified: Secondary | ICD-10-CM | POA: Diagnosis not present

## 2017-10-06 DIAGNOSIS — Z01419 Encounter for gynecological examination (general) (routine) without abnormal findings: Secondary | ICD-10-CM | POA: Diagnosis not present

## 2017-10-06 DIAGNOSIS — N951 Menopausal and female climacteric states: Secondary | ICD-10-CM | POA: Diagnosis not present

## 2017-10-06 DIAGNOSIS — Z1231 Encounter for screening mammogram for malignant neoplasm of breast: Secondary | ICD-10-CM | POA: Diagnosis not present

## 2017-10-08 ENCOUNTER — Ambulatory Visit: Payer: Medicare Other | Admitting: Cardiology

## 2017-10-08 ENCOUNTER — Encounter: Payer: Self-pay | Admitting: Cardiology

## 2017-10-08 VITALS — BP 120/90 | HR 84 | Ht 61.0 in | Wt 200.1 lb

## 2017-10-08 DIAGNOSIS — M450 Ankylosing spondylitis of multiple sites in spine: Secondary | ICD-10-CM

## 2017-10-08 DIAGNOSIS — R079 Chest pain, unspecified: Secondary | ICD-10-CM

## 2017-10-08 DIAGNOSIS — R002 Palpitations: Secondary | ICD-10-CM | POA: Diagnosis not present

## 2017-10-08 NOTE — Patient Instructions (Signed)
Medication Instructions:  The current medical regimen is effective;  continue present plan and medications.  Testing/Procedures: Your physician has requested that you have a lexiscan myoview. For further information please visit [email or web address]. Please follow instruction sheet, as given.  Follow-Up: Follow up as needed after the above testing.  Thank you for choosing [facility]!!     

## 2017-10-08 NOTE — Progress Notes (Signed)
Cardiology Office Note:    Date:  10/09/2017   ID:  [name], DOB November 19, 1955, MRN [id number]  PCP:  Donato Schultz, DO  Cardiologist:  No primary care provider on file.   Referring MD: Zola Button, Grayling Congress, *     History of Present Illness:    [name] is a 62 y.o. female previously seen by Dr. Shirlee Latch here for evaluation of chest pain at request of Dr. Laury Axon.  In review of prior notes, she was having central aching radiating to her upper back off and on for many years, no trigger, nonexertional sometimes after meals but not always with some shortness of breath when walking upstairs.  No states that she is having chest pain again sometimes radiating to her left arm, upper back.  It seems to be confusing to her whether or not this is coming from a GI source or perhaps from her ankylosing spondylitis.  She has had a prior gastric bypass.  Sometimes when she eats too much sugar or eats too big of a meal she can feel some discomfort.  Sometimes as well she will feel palpitations, her heart race.  She wears a fit bit and today for instance it said 108 bpm.  This is when she was coming into the office.  No syncope, bleeding, orthopnea, PND  Past medical history includes: 1. Ankylosing spondylitis: Follows with a rheumatologist.  History of hip/pelvis replacement in 2010.  2. Echo 2005: EF 55-65%, normal. 3. GERD 4. H/o gastric bypass 5. Atypical chest pain.   She is on disability, non-smoker [place] in [place]  Past Medical History:  Diagnosis Date  . Abdominal pain, other specified site 03/31/2008   Centricity Description: FLANK PAIN, RIGHT Qualifier: Diagnosis of  By: Alwyn Ren MD, Chrissie Noa   Centricity Description: ABDOMINAL PAIN OTHER SPECIFIED SITE Qualifier: Diagnosis of  By: Janit Bern    . Acute sprain or strain of cervical region 04/10/2012  . ALLERGIC RHINITIS 06/26/2007   Qualifier: Diagnosis of  By: Charlsie Quest RMA, Lucy    . ANKYLOSING SPONDYLITIS  03/31/2008   Qualifier: Diagnosis of  By: Alwyn Ren MD, Chrissie Noa    . Ankylosing spondylitis of multiple sites in spine (HCC)   . Arthritis   . Bariatric surgery status 01/14/2008   Qualifier: Diagnosis of  By: Janit Bern    . Blood in stool 12/02/2008   Qualifier: Diagnosis of  By: Janit Bern    . Calf pain 10/13/2012  . CHEST PAIN UNSPECIFIED 10/11/2009   Qualifier: Diagnosis of  By: Arlyce Dice MD, Barbette Hair   . CONSTIPATION 04/06/2010   Qualifier: Diagnosis of  By: Janit Bern    . CORNEAL ABRASION, LEFT 01/22/2010   Qualifier: Diagnosis of  By: Janit Bern    . Gastritis   . GERD 06/26/2007   Qualifier: Diagnosis of  By: Charlsie Quest RMA, Lucy    . GERD (gastroesophageal reflux disease)   . Hiatal hernia   . Hypertension   . Hypoglycemia, unspecified 01/30/2009   Qualifier: Diagnosis of  By: Janit Bern    . IBS 06/26/2007   Qualifier: Diagnosis of  By: Charlsie Quest RMA, Lucy    . LEG CRAMPS, NOCTURNAL 11/14/2010   Qualifier: Diagnosis of  By: Floydene Flock    . Long-term current use of steroids 04/20/2013  . LYMPHADENOPATHY 06/10/2007   Qualifier: Diagnosis of  By: Janit Bern    . MORBID OBESITY 06/26/2007   Qualifier: Diagnosis of  By: Samara Snide    . MUSCLE PAIN 03/31/2008   Qualifier: Diagnosis of  By: Alwyn Ren MD, Chrissie Noa    . Noninfectious gastroenteritis and colitis 03/17/2013  . Obesity (BMI 30-39.9) 03/16/2014  . OSA (obstructive sleep apnea) 03/08/2016  . 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 Seasonal allergies 05/14/2015  . THYROID CYST 06/26/2007   Qualifier: Diagnosis of  By: Charlsie Quest RMA, Lucy    . Thyroid nodule   . THYROID NODULE, HX OF 06/10/2007   Qualifier: Diagnosis of  By: Janit Bern      Past Surgical History:  Procedure Laterality Date  . BARIATRIC SURGERY    . BREAST REDUCTION SURGERY  1996  . ESOPHAGOGASTRODUODENOSCOPY    . pelvis replacement  2010  . TOTAL HIP ARTHROPLASTY       x 2 left, one revision    Current Medications: Current Meds  Medication Sig  . Acetaminophen (TYLENOL EXTRA STRENGTH PO) Take by mouth.    . Biotin 5000 MCG TABS Take 1 tablet by mouth daily.  . brompheniramine-pseudoephedrine-DM 30-2-10 MG/5ML syrup Take 10 mLs by mouth 4 (four) times daily as needed.  . calcium carbonate 200 MG capsule Take 250 mg by mouth 2 (two) times daily with a meal.    . ferrous sulfate 325 (65 FE) MG EC tablet Take 1 tablet by mouth daily.  Marland Kitchen FLUOCINOLONE ACETONIDE SCALP 0.01 % OIL as needed. Reported on 04/12/2016  . ipratropium (ATROVENT) 0.06 % nasal spray Place 2 sprays into both nostrils 4 (four) times daily.  . multivitamin-iron-minerals-folic acid (CENTRUM) chewable tablet Chew 1 tablet by mouth daily.  Marland Kitchen NEXIUM 40 MG capsule TAKE 1 CAPSULE (40 MG TOTAL) BY MOUTH DAILY.  . ONE TOUCH ULTRA TEST test strip USE ONCE DAILY  . Phentermine-Topiramate (QSYMIA) 7.5-46 MG CP24 Take by mouth. Reported on 04/12/2016  . POTASSIUM CHLORIDE CR PO Take 595 mg by mouth.   . pyridoxine (B-6) 100 MG tablet Take 100 mg by mouth daily.  . sucralfate (CARAFATE) 1 g tablet Take 1 tablet by mouth daily.  . [DISCONTINUED] predniSONE (DELTASONE) 5 MG tablet Take 5 mg by mouth as needed.     Allergies:   Aspirin; Codeine; Morphine; and Sulfonamide derivatives   Social History   Socioeconomic History  . Marital status: Married    Spouse name: None  . Number of children: None  . Years of education: None  . Highest education level: None  Social Needs  . Financial resource strain: None  . Food insecurity - worry: None  . Food insecurity - inability: None  . Transportation needs - medical: None  . Transportation needs - non-medical: None  Occupational History  . Occupation: retired  Tobacco Use  . Smoking status: Never Smoker  . Smokeless tobacco: Never Used  Substance and Sexual Activity  . Alcohol use: No    Alcohol/week: 0.0 oz  . Drug use: No  . Sexual activity:  None  Other Topics Concern  . None  Social History Narrative   Occupation: Sports coach   Daily Caffeine use- 1 cup daily        Family History: The patient's family history includes Arthritis in her father; Breast cancer in her maternal grandmother; Diabetes in her father and sister; Hypertension in her father, mother, and sister; Melanoma in her maternal grandmother. No early CAD  ROS:   Please see the history of present  illness.     All other systems reviewed and are negative.  EKGs/Labs/Other Studies Reviewed:    The following studies were reviewed today: Nuclear stress test 01/22/13: Overall low risk but no significant ischemia.  EKG:  EKG is  ordered today.  The ekg ordered today demonstrates 10/08/17-sinus rhythm, poor R wave progression heart rate 84 bpm otherwise unremarkable.  Recent Labs: No results found for requested labs within last 8760 hours.  Recent Lipid Panel    Component Value Date/Time   CHOL 171 06/11/2016 1044   TRIG 60.0 06/11/2016 1044   HDL 94.50 06/11/2016 1044   CHOLHDL 2 06/11/2016 1044   VLDL 12.0 06/11/2016 1044   LDLCALC 65 06/11/2016 1044   Creatinine 0.7  Physical Exam:    VS:  BP 120/90   Pulse 84   Ht 5\' 1"  (1.549 m)   Wt 200 lb 1.9 oz (90.8 kg)   SpO2 97%   BMI 37.81 kg/m     Wt Readings from Last 3 Encounters:  10/08/17 200 lb 1.9 oz (90.8 kg)  05/29/17 196 lb 12.8 oz (89.3 kg)  06/11/16 201 lb 6.4 oz (91.4 kg)     GEN:  Well nourished, well developed in no acute distress HEENT: Normal NECK: No JVD; No carotid bruits LYMPHATICS: No lymphadenopathy CARDIAC: RRR, no murmurs, rubs, gallops RESPIRATORY:  Clear to auscultation without rales, wheezing or rhonchi  ABDOMEN: Soft, non-tender, non-distended MUSCULOSKELETAL:  No edema; No deformity  SKIN: Warm and dry NEUROLOGIC:  Alert and oriented x 3 PSYCHIATRIC:  Normal affect   ASSESSMENT:    1. Chest pain, unspecified type   2. Palpitation   3. Ankylosing  spondylitis of multiple sites in spine [facility](HCC)    PLAN:    In order of problems listed above:  62 year old with atypical chest pain, ankylosing spondylitis - Prior nuclear stress test approximately 5 years ago was low risk.  It would not be unreasonable to repeat this study given her ankylosing spondylitis, inflammatory disease which can increase risks of CAD.  Unable to walk treadmill.  History of hip replacement.  Lexiscan. -Otherwise, excellent primary prevention with wonderful cholesterol, excellent hemoglobin A1c.  Continue to advocate weight loss, exercise.  Palpitations -Fitbit demonstrated heart rate of 108 bpm.  Reassurance.  No associated symptoms other than the sensation of increased heart rate.  Continue to monitor.  Medication Adjustments/Labs and Tests Ordered: Current medicines are reviewed at length with the patient today.  Concerns regarding medicines are outlined above.  Orders Placed This Encounter  Procedures  . Myocardial Perfusion Imaging  . EKG 12-Lead   No orders of the defined types were placed in this encounter.   Signed, Donato SchultzMark Breella Vanostrand, MD  10/09/2017 6:34 AM    [facility]

## 2017-10-13 ENCOUNTER — Telehealth (HOSPITAL_COMMUNITY): Payer: Self-pay | Admitting: *Deleted

## 2017-10-13 NOTE — Telephone Encounter (Signed)
Patient given detailed instructions per Myocardial Perfusion Study Information Sheet for the test on 10/06/17 at 0730. Patient notified to arrive 15 minutes early and that it is imperative to arrive on time for appointment to keep from having the test rescheduled.  If you need to cancel or reschedule your appointment, please call the office within 24 hours of your appointment. . Patient verbalized understanding.Lonna Rabold, Adelene IdlerCynthia W

## 2017-10-16 ENCOUNTER — Ambulatory Visit (HOSPITAL_COMMUNITY): Payer: Medicare Other | Attending: Internal Medicine

## 2017-10-16 DIAGNOSIS — R0609 Other forms of dyspnea: Secondary | ICD-10-CM | POA: Insufficient documentation

## 2017-10-16 DIAGNOSIS — R9439 Abnormal result of other cardiovascular function study: Secondary | ICD-10-CM | POA: Diagnosis not present

## 2017-10-16 DIAGNOSIS — I2510 Atherosclerotic heart disease of native coronary artery without angina pectoris: Secondary | ICD-10-CM | POA: Diagnosis not present

## 2017-10-16 DIAGNOSIS — R079 Chest pain, unspecified: Secondary | ICD-10-CM | POA: Insufficient documentation

## 2017-10-16 MED ORDER — REGADENOSON 0.4 MG/5ML IV SOLN
0.4000 mg | Freq: Once | INTRAVENOUS | Status: AC
  Administered 2017-10-16: 0.4 mg via INTRAVENOUS

## 2017-10-16 MED ORDER — TECHNETIUM TC 99M TETROFOSMIN IV KIT
31.5000 | PACK | Freq: Once | INTRAVENOUS | Status: AC | PRN
  Administered 2017-10-16: 31.5 via INTRAVENOUS
  Filled 2017-10-16: qty 32

## 2017-10-17 ENCOUNTER — Ambulatory Visit (HOSPITAL_COMMUNITY): Payer: Medicare Other | Attending: Cardiology

## 2017-10-17 LAB — MYOCARDIAL PERFUSION IMAGING
CHL CUP NUCLEAR SDS: 10
CHL CUP NUCLEAR SRS: 1
CHL CUP NUCLEAR SSS: 11
LV dias vol: 68 mL (ref 46–106)
LVSYSVOL: 27 mL
NUC STRESS TID: 0.94
Peak HR: 125 {beats}/min
RATE: 0.26
Rest HR: 83 {beats}/min

## 2017-10-17 MED ORDER — TECHNETIUM TC 99M TETROFOSMIN IV KIT
32.0000 | PACK | Freq: Once | INTRAVENOUS | Status: AC | PRN
  Administered 2017-10-17: 32 via INTRAVENOUS
  Filled 2017-10-17: qty 32

## 2017-10-23 ENCOUNTER — Ambulatory Visit (HOSPITAL_COMMUNITY): Payer: Medicare Other

## 2017-11-10 ENCOUNTER — Ambulatory Visit: Payer: Self-pay | Admitting: Rheumatology

## 2017-11-11 NOTE — Progress Notes (Signed)
Office Visit Note  Patient: [name]             Date of Birth: 1956/08/31           MRN: [id number]             PCP: Donato Schultz, DO Referring: Donato Schultz, * Visit Date: 11/17/2017 Occupation: @GUAROCC @    Subjective:  Ankylosing spondylitis    History of Present Illness: [name] is a 62 y.o. female seen in consultation per request of her PCP.  According to patient at age 40 she had left total hip replacement.  10 years later she slipped down the stairs and had revision of left total hip replacement.  In 2009 she had second revision which lasted for only short time.  She had to undergo hip and pelvis replacement in [place] in 2010.  She recalls in 1976 she was having a lot of neck and base of her skull discomfort for which she was seen by several physicians and the symptoms eventually resolved.  Over the last 20 years she has had increased neck pain and stiffness and decreased range of motion.  She has been under care of Dr. Leida Lauth who diagnosed her with ankylosing spondylitis and started her on Enbrel.  According to patient she read about the side effects of Enbrel and discontinued the medication.  She states she has taken only prednisone taper as needed for flares.  After Dr. Leida Lauth retired she started seeing Dr. Kellie Simmering.  Who also advised Enbrel but she decided to take only prednisone tapers.  She states she usually takes prednisone 5 mg p.o. daily for short.  And then stops it.  She is also tried physical therapy.  Recently she has been experiencing increased pain in her hands and stiffness and some swelling.  She is also had right knee joint swelling and pain for multiple years which has been causing more discomfort lately.  She has decreased range of motion of her C-spine.  She has some stiffness in her C-spine and lower back. She has Hx of iritis.  Her last flare was about 3 months ago.  She states she is to have frequent flares but now the flares  have become less frequent.  Activities of Daily Living:  Patient reports morning stiffness for 1 hour.   Patient Reports nocturnal pain.  Difficulty dressing/grooming: Denies Difficulty climbing stairs: Denies Difficulty getting out of chair: Denies Difficulty using hands for taps, buttons, cutlery, and/or writing: Denies   Review of Systems  Constitutional: Negative for fatigue and weakness.  HENT: Negative for mouth sores, mouth dryness and nose dryness.   Eyes: Negative for pain, redness, visual disturbance and dryness.  Respiratory: Negative for cough, hemoptysis, shortness of breath and difficulty breathing.   Cardiovascular: Negative for chest pain, palpitations, hypertension, irregular heartbeat and swelling in legs/feet.  Gastrointestinal: Negative for blood in stool, constipation and diarrhea.  Endocrine: Negative for increased urination.  Genitourinary: Negative for painful urination.  Musculoskeletal: Positive for arthralgias, joint pain, joint swelling and morning stiffness. Negative for myalgias, muscle weakness, muscle tenderness and myalgias.  Skin: Positive for hair loss. Negative for color change, pallor, rash, nodules/bumps, redness, skin tightness, ulcers and sensitivity to sunlight.  Allergic/Immunologic: Negative for susceptible to infections.  Neurological: Negative for dizziness, numbness and headaches.  Hematological: Negative for swollen glands.  Psychiatric/Behavioral: Negative for depressed mood and sleep disturbance. The patient is not nervous/anxious.     PMFS History:  Patient  Active Problem List   Diagnosis Date Noted  . OSA (obstructive sleep apnea) 03/08/2016  . Seasonal allergies 05/14/2015  . Obesity (BMI 30-39.9) 03/16/2014  . Long-term current use of steroids 04/20/2013  . Noninfectious gastroenteritis and colitis 03/17/2013  . Calf pain 10/13/2012  . Acute sprain or strain of cervical region 04/10/2012  . LEG CRAMPS, NOCTURNAL 11/14/2010  .  OTHER SPECIFIED DISEASE OF WHITE BLOOD CELLS 07/23/2010  . CONSTIPATION 04/06/2010  . CORNEAL ABRASION, LEFT 01/22/2010  . CHEST PAIN UNSPECIFIED 10/11/2009  . HYPOGLYCEMIA, UNSPECIFIED 01/30/2009  . BLOOD IN STOOL 12/02/2008  . ANKYLOSING SPONDYLITIS 03/31/2008  . MUSCLE PAIN 03/31/2008  . BARIATRIC SURGERY STATUS 01/14/2008  . THYROID CYST 06/26/2007  . MORBID OBESITY 06/26/2007  . HYPERTENSION 06/26/2007  . GERD 06/26/2007  . IBS 06/26/2007  . LYMPHADENOPATHY 06/10/2007  . THYROID NODULE, HX OF 06/10/2007    Past Medical History:  Diagnosis Date  . Abdominal pain, other specified site 03/31/2008   Centricity Description: FLANK PAIN, RIGHT Qualifier: Diagnosis of  By: Alwyn RenHopper MD, Chrissie NoaWilliam   Centricity Description: ABDOMINAL PAIN OTHER SPECIFIED SITE Qualifier: Diagnosis of  By: Janit BernLowne DO, Yvonne    . Acute sprain or strain of cervical region 04/10/2012  . ALLERGIC RHINITIS 06/26/2007   Qualifier: Diagnosis of  By: Charlsie QuestBrand RMA, Lucy    . ANKYLOSING SPONDYLITIS 03/31/2008   Qualifier: Diagnosis of  By: Alwyn RenHopper MD, Chrissie NoaWilliam    . Ankylosing spondylitis of multiple sites in spine (HCC)   . Arthritis   . Bariatric surgery status 01/14/2008   Qualifier: Diagnosis of  By: Janit BernLowne DO, Yvonne    . Blood in stool 12/02/2008   Qualifier: Diagnosis of  By: Janit BernLowne DO, Yvonne    . Calf pain 10/13/2012  . CHEST PAIN UNSPECIFIED 10/11/2009   Qualifier: Diagnosis of  By: Arlyce DiceKaplan MD, Barbette Hairobert D   . CONSTIPATION 04/06/2010   Qualifier: Diagnosis of  By: Janit BernLowne DO, Yvonne    . CORNEAL ABRASION, LEFT 01/22/2010   Qualifier: Diagnosis of  By: Janit BernLowne DO, Yvonne    . Gastritis   . GERD 06/26/2007   Qualifier: Diagnosis of  By: Charlsie QuestBrand RMA, Lucy    . GERD (gastroesophageal reflux disease)   . Hiatal hernia   . Hypertension   . Hypoglycemia, unspecified 01/30/2009   Qualifier: Diagnosis of  By: Janit BernLowne DO, Yvonne    . IBS 06/26/2007   Qualifier: Diagnosis of  By: Charlsie QuestBrand RMA, Lucy    . LEG CRAMPS, NOCTURNAL 11/14/2010   Qualifier:  Diagnosis of  By: Floydene FlockHoops, Regina    . Long-term current use of steroids 04/20/2013  . LYMPHADENOPATHY 06/10/2007   Qualifier: Diagnosis of  By: Janit BernLowne DO, Yvonne    . MORBID OBESITY 06/26/2007   Qualifier: Diagnosis of  By: Charlsie QuestBrand RMA, Lucy    . MUSCLE PAIN 03/31/2008   Qualifier: Diagnosis of  By: Alwyn RenHopper MD, Chrissie NoaWilliam    . Noninfectious gastroenteritis and colitis 03/17/2013  . Obesity (BMI 30-39.9) 03/16/2014  . OSA (obstructive sleep apnea) 03/08/2016  . 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/23/2010   Qualifier: Diagnosis of  By: Janit BernLowne DO, Yvonne    . Seasonal allergies 05/14/2015  . THYROID CYST 06/26/2007   Qualifier: Diagnosis of  By: Charlsie QuestBrand RMA, Lucy    . Thyroid nodule   . THYROID NODULE, HX OF 06/10/2007   Qualifier: Diagnosis of  By: Janit BernLowne DO, Yvonne      Family History  Problem Relation Age of Onset  . Hypertension Mother   .  Arthritis Father   . Hypertension Father   . Diabetes Father   . Melanoma Maternal Grandmother   . Breast cancer Maternal Grandmother        and Aunt  . Hypertension Sister   . Diabetes Sister    Past Surgical History:  Procedure Laterality Date  . BARIATRIC SURGERY    . BREAST REDUCTION SURGERY  1996  . ESOPHAGOGASTRODUODENOSCOPY    . pelvis replacement  2010  . TOTAL HIP ARTHROPLASTY     x 2 left, one revision   Social History   Social History Narrative   Occupation: Sports coach   Daily Caffeine use- 1 cup daily        Objective: Vital Signs: BP 134/82 (BP Location: Right Arm, Patient Position: Sitting, Cuff Size: Normal)   Pulse 88   Resp 15   Ht 5\' 2"  (1.575 m)   Wt 205 lb (93 kg)   BMI 37.49 kg/m    Physical Exam   Musculoskeletal Exam: She has very limited range of motion of her C-spine thoracic and lumbar spine.  No SI joint tenderness was noted.  Shoulder joints elbow joints wrist joints with good range of motion.  She has tenderness across her PIP joints but no synovitis was noted.  She has very limited range of  motion of her left hip joint which has been replaced.  Right hip joint is good range of motion.  She had discomfort with range of motion of her right knee joint.  Ankles MTPs PIPs with good range of motion.  CDAI Exam: CDAI Homunculus Exam:   Joint Counts:  CDAI Tender Joint count: 0 CDAI Swollen Joint count: 0     Investigation: No additional findings.  CBC Latest Ref Rng & Units 06/11/2016 05/11/2015 03/16/2014  WBC 4.0 - 10.5 K/uL 7.7 4.1 3.7(L)  Hemoglobin 12.0 - 15.0 g/dL 11.9 14.7 82.9  Hematocrit 36.0 - 46.0 % 40.0 41.4 43.5  Platelets 150.0 - 400.0 K/uL 361.0 328.0 313.0   CMP Latest Ref Rng & Units 06/24/2016 06/11/2016 05/11/2015  Glucose 70 - 99 mg/dL 84 562(Z) 89  BUN 6 - 23 mg/dL 11 11 10   Creatinine 0.40 - 1.20 mg/dL 3.08 6.57 8.46  Sodium 135 - 145 mEq/L 139 139 140  Potassium 3.5 - 5.1 mEq/L 4.7 3.2(L) 3.7  Chloride 96 - 112 mEq/L 104 101 102  CO2 19 - 32 mEq/L 29 30 28   Calcium 8.4 - 10.5 mg/dL 8.8 9.5 9.3  Total Protein 6.0 - 8.3 g/dL - 8.0 7.6  Total Bilirubin 0.2 - 1.2 mg/dL - 0.6 0.7  Alkaline Phos 39 - 117 U/L - 67 74  AST 0 - 37 U/L - 23 27  ALT 0 - 35 U/L - 19 21   Imaging: Xr Cervical Spine 2 Or 3 Views  Result Date: 11/17/2017 Squaring of cervical spine vertebrae is was noted.  Syndesmophytes were noted.  Fusion of C1, C2, C3, C4 and C6-C7 was noted.  Fusion of facet joints was noted. Impression: These findings are consistent with ankylosing spondylitis.  Xr Hand 2 View Left  Result Date: 11/17/2017 Minimal PIP DIP and CMC joint narrowing was noted.  No MCP or intercarpal joint space narrowing was noted.  No erosive changes were noted. Impression: These findings are consistent with mild osteoarthritis of the hand.  Xr Hand 2 View Right  Result Date: 11/17/2017 Minimal PIP DIP and CMC joint narrowing was noted.  No MCP or intercarpal joint space narrowing was noted.  No erosive changes were noted. Impression: These findings are consistent with mild  osteoarthritis of the hand.  Xr Knee 3 View Right  Result Date: 11/17/2017 Moderate medial compartment narrowing and lateral osteophytes were noted.  No chondrocalcinosis was noted.  Severe patellofemoral narrowing was noted. Impression: These findings are consistent with moderate osteoarthritis and severe chondromalacia patella.  Xr Lumbar Spine 2-3 Views  Result Date: 11/17/2017 No significant lumbar spine disc space narrowing was noted.  Facet joint arthropathy was noted.  No syndesmophytes were noted.  Xr Pelvis 1-2 Views  Result Date: 11/17/2017 Left SI joint is completely fused.  Right SI joint is partially fused.  Prosthesis was noted in left hip with revision acetabular component. Impression: These findings are consistent with ankylosing spondylitis and left hip prosthesis.   Speciality Comments: No specialty comments available.    Procedures:  No procedures performed Allergies: Aspirin; Codeine; Morphine; and Sulfonamide derivatives   Assessment / Plan:     Visit Diagnoses: Ankylosing spondylitis of multiple sites in spine [facility]): Patient has long-standing history of ankylosing spondylitis with very limited range of motion of her spine.  She has been having discomfort in her C-spine.  She was offered Enbrel x2 in the past by 2 different rheumatologist which she declined.  We had detailed discussion regarding progression of ankylosing spondylitis.  I believe Humira will be the right choice for her with a history of iritis.  She is concerned about the side effects of anti-TNF's I also discussed possible use of Cosyntex.  She states she takes prednisone taper once every 6 months which is usually low-dose.  History of iritis: Patient reports that she has iritis flares once in few years.  For which she takes prednisone taper.  Neck pain - Plan: XR Cervical Spine 2 or 3 views  Chronic bilateral low back pain without sciatica - Plan: XR Lumbar Spine 2-3 Views, XR Pelvis 1-2  Views  Pain in both hands - Plan: XR Hand 2 View Right, XR Hand 2 View Left  Chronic pain of right knee - Plan: XR KNEE 3 VIEW RIGHT  History of total hip replacement, left - At age 31, revision x2 then in 2010 hip and pelvis replacement in [place].  History of gastritis  History of gastroesophageal reflux (GERD)  OSA (obstructive sleep apnea)  Thyroid nodule  History of bariatric surgery    Orders: Orders Placed This Encounter  Procedures  . XR Cervical Spine 2 or 3 views  . XR Lumbar Spine 2-3 Views  . XR Pelvis 1-2 Views  . XR Hand 2 View Right  . XR Hand 2 View Left  . XR KNEE 3 VIEW RIGHT  . CBC with Differential/Platelet  . COMPLETE METABOLIC PANEL WITH GFR  . VITAMIN D 25 Hydroxy (Vit-D Deficiency, Fractures)  . Hepatitis B core antibody, IgM  . Hepatitis B surface antigen  . Hepatitis C antibody  . HIV antibody  . QuantiFERON-TB Gold Plus  . Serum protein electrophoresis with reflex  . IgG, IgA, IgM   No orders of the defined types were placed in this encounter.   Face-to-face time spent with patient was 50 minutes.  Greater than 50% of time was spent in counseling and coordination of care.  Follow-Up Instructions: Return for Ankylosing spondylitis.   Pollyann Savoy, MD  Note - This record has been created using Animal nutritionist.  Chart creation errors have been sought, but may not always  have been located. Such creation errors do not reflect on  the standard of medical care. 

## 2017-11-17 ENCOUNTER — Ambulatory Visit (INDEPENDENT_AMBULATORY_CARE_PROVIDER_SITE_OTHER): Payer: Medicare Other

## 2017-11-17 ENCOUNTER — Ambulatory Visit (INDEPENDENT_AMBULATORY_CARE_PROVIDER_SITE_OTHER): Payer: Self-pay

## 2017-11-17 ENCOUNTER — Encounter: Payer: Self-pay | Admitting: Rheumatology

## 2017-11-17 ENCOUNTER — Ambulatory Visit: Payer: Medicare Other | Admitting: Rheumatology

## 2017-11-17 VITALS — BP 134/82 | HR 88 | Resp 15 | Ht 62.0 in | Wt 205.0 lb

## 2017-11-17 DIAGNOSIS — G8929 Other chronic pain: Secondary | ICD-10-CM

## 2017-11-17 DIAGNOSIS — M79641 Pain in right hand: Secondary | ICD-10-CM

## 2017-11-17 DIAGNOSIS — E041 Nontoxic single thyroid nodule: Secondary | ICD-10-CM

## 2017-11-17 DIAGNOSIS — M542 Cervicalgia: Secondary | ICD-10-CM | POA: Diagnosis not present

## 2017-11-17 DIAGNOSIS — Z79899 Other long term (current) drug therapy: Secondary | ICD-10-CM

## 2017-11-17 DIAGNOSIS — M545 Low back pain, unspecified: Secondary | ICD-10-CM

## 2017-11-17 DIAGNOSIS — M450 Ankylosing spondylitis of multiple sites in spine: Secondary | ICD-10-CM | POA: Diagnosis not present

## 2017-11-17 DIAGNOSIS — M25561 Pain in right knee: Secondary | ICD-10-CM | POA: Diagnosis not present

## 2017-11-17 DIAGNOSIS — M25541 Pain in joints of right hand: Secondary | ICD-10-CM | POA: Diagnosis not present

## 2017-11-17 DIAGNOSIS — Z8669 Personal history of other diseases of the nervous system and sense organs: Secondary | ICD-10-CM

## 2017-11-17 DIAGNOSIS — Z9884 Bariatric surgery status: Secondary | ICD-10-CM

## 2017-11-17 DIAGNOSIS — M79642 Pain in left hand: Secondary | ICD-10-CM

## 2017-11-17 DIAGNOSIS — M25542 Pain in joints of left hand: Secondary | ICD-10-CM

## 2017-11-17 DIAGNOSIS — G4733 Obstructive sleep apnea (adult) (pediatric): Secondary | ICD-10-CM

## 2017-11-17 DIAGNOSIS — Z8719 Personal history of other diseases of the digestive system: Secondary | ICD-10-CM

## 2017-11-17 DIAGNOSIS — Z96642 Presence of left artificial hip joint: Secondary | ICD-10-CM

## 2017-11-17 NOTE — Patient Instructions (Signed)
Secukinumab injection What is this medicine? SECUKINUMAB (sek ue KIN ue mab) is used to treat psoriasis. It is also used to treat psoriatic arthritis and ankylosing spondylitis. This medicine may be used for other purposes; ask your health care provider or pharmacist if you have questions. COMMON BRAND NAME(S): Cosentyx What should I tell my health care provider before I take this medicine? They need to know if you have any of these conditions: -Crohn's disease, ulcerative colitis, or other inflammatory bowel disease -infection or history of infection -other conditions affecting the immune system -recently received or are scheduled to receive a vaccine -tuberculosis, a positive skin test for tuberculosis, or have recently been in close contact with someone who has tuberculosis -an unusual or allergic reaction to secukinumab, other medicines, latex, rubber, foods, dyes, or preservatives -pregnant or trying to get pregnant -breast-feeding How should I use this medicine? This medicine is for injection under the skin. It may be administered by a healthcare professional in a hospital or clinic setting or at home. If you get this medicine at home, you will be taught how to prepare and give this medicine. Use exactly as directed. Take your medicine at regular intervals. Do not take your medicine more often than directed. It is important that you put your used needles and syringes in a special sharps container. Do not put them in a trash can. If you do not have a sharps container, call your pharmacist or healthcare provider to get one. A special MedGuide will be given to you by the pharmacist with each prescription and refill. Be sure to read this information carefully each time. Talk to your pediatrician regarding the use of this medicine in children. Special care may be needed. Overdosage: If you think you have taken too much of this medicine contact a poison control center or emergency room at  once. NOTE: This medicine is only for you. Do not share this medicine with others. What if I miss a dose? It is important not to miss your dose. Call your doctor of health care professional if you are unable to keep an appointment. If you give yourself the medicine and you miss a dose, take it as soon as you can. If it is almost time for your next dose, take only that dose. Do not take double or extra doses. What may interact with this medicine? Do not take this medicine with any of the following medications: -live virus vaccines This medicine may also interact with the following medications: -cyclosporine -inactivated vaccines -warfarin This list may not describe all possible interactions. Give your health care provider a list of all the medicines, herbs, non-prescription drugs, or dietary supplements you use. Also tell them if you smoke, drink alcohol, or use illegal drugs. Some items may interact with your medicine. What should I watch for while using this medicine? Tell your doctor or healthcare professional if your symptoms do not start to get better or if they get worse. You will be tested for tuberculosis (TB) before you start this medicine. If your doctor prescribes any medicine for TB, you should start taking the TB medicine before starting this medicine. Make sure to finish the full course of TB medicine. Call your doctor or healthcare professional for advice if you get a fever, chills or sore throat, or other symptoms of a cold or flu. Do not treat yourself. This drug decreases your body's ability to fight infections. Try to avoid being around people who are sick. This medicine can decrease   the response to a vaccine. If you need to get vaccinated, tell your healthcare professional if you have received this medicine within the last 6 months. Extra booster doses may be needed. Talk to your doctor to see if a different vaccination schedule is needed. What side effects may I notice from  receiving this medicine? Side effects that you should report to your doctor or health care professional as soon as possible: -allergic reactions like skin rash, itching or hives, swelling of the face, lips, or tongue -signs and symptoms of infection like fever or chills; cough; sore throat; pain or trouble passing urine Side effects that usually do not require medical attention (report to your doctor or health care professional if they continue or are bothersome): -diarrhea This list may not describe all possible side effects. Call your doctor for medical advice about side effects. You may report side effects to FDA at 1-800-FDA-1088. Where should I keep my medicine? Keep out of the reach of children. Store the prefilled syringe or injection pen in a refrigerator between 2 to 8 degrees C (36 to 46 degrees F). Keep the syringe or the pen in the original carton until ready for use. Protect from light. Do not freeze. Do not shake. Prior to use, remove the syringe or pen from the refrigerator and use within 1 hour. Throw away any unused medicine after the expiration date on the label. NOTE: This sheet is a summary. It may not cover all possible information. If you have questions about this medicine, talk to your doctor, pharmacist, or health care provider.  2018 Elsevier/Gold Standard (2015-10-19 11:48:31)  

## 2017-11-19 LAB — PROTEIN ELECTROPHORESIS, SERUM, WITH REFLEX
Albumin ELP: 3.8 g/dL (ref 3.8–4.8)
Alpha 1: 0.3 g/dL (ref 0.2–0.3)
Alpha 2: 0.8 g/dL (ref 0.5–0.9)
BETA 2: 0.5 g/dL (ref 0.2–0.5)
BETA GLOBULIN: 0.6 g/dL (ref 0.4–0.6)
Gamma Globulin: 1 g/dL (ref 0.8–1.7)
TOTAL PROTEIN: 6.9 g/dL (ref 6.1–8.1)

## 2017-11-19 LAB — CBC WITH DIFFERENTIAL/PLATELET
BASOS PCT: 0.7 %
Basophils Absolute: 30 cells/uL (ref 0–200)
EOS ABS: 52 {cells}/uL (ref 15–500)
Eosinophils Relative: 1.2 %
HEMATOCRIT: 38.6 % (ref 35.0–45.0)
Hemoglobin: 13.1 g/dL (ref 11.7–15.5)
LYMPHS ABS: 1845 {cells}/uL (ref 850–3900)
MCH: 32 pg (ref 27.0–33.0)
MCHC: 33.9 g/dL (ref 32.0–36.0)
MCV: 94.4 fL (ref 80.0–100.0)
MPV: 10.2 fL (ref 7.5–12.5)
Monocytes Relative: 10.1 %
NEUTROS PCT: 45.1 %
Neutro Abs: 1939 cells/uL (ref 1500–7800)
Platelets: 330 10*3/uL (ref 140–400)
RBC: 4.09 10*6/uL (ref 3.80–5.10)
RDW: 11.6 % (ref 11.0–15.0)
Total Lymphocyte: 42.9 %
WBC: 4.3 10*3/uL (ref 3.8–10.8)
WBCMIX: 434 {cells}/uL (ref 200–950)

## 2017-11-19 LAB — COMPLETE METABOLIC PANEL WITH GFR
AG RATIO: 1.6 (calc) (ref 1.0–2.5)
ALT: 20 U/L (ref 6–29)
AST: 20 U/L (ref 10–35)
Albumin: 4.1 g/dL (ref 3.6–5.1)
Alkaline phosphatase (APISO): 73 U/L (ref 33–130)
BILIRUBIN TOTAL: 0.6 mg/dL (ref 0.2–1.2)
BUN: 12 mg/dL (ref 7–25)
CALCIUM: 9.2 mg/dL (ref 8.6–10.4)
CHLORIDE: 105 mmol/L (ref 98–110)
CO2: 26 mmol/L (ref 20–32)
Creat: 0.55 mg/dL (ref 0.50–1.05)
GFR, EST AFRICAN AMERICAN: 119 mL/min/{1.73_m2} (ref >=60)
GFR, Est Non African American: 103 mL/min/{1.73_m2} (ref >=60)
GLOBULIN: 2.6 g/dL (ref 1.9–3.7)
Glucose, Bld: 92 mg/dL (ref 65–99)
POTASSIUM: 4.3 mmol/L (ref 3.5–5.3)
SODIUM: 139 mmol/L (ref 135–146)
Total Protein: 6.7 g/dL (ref 6.1–8.1)

## 2017-11-19 LAB — QUANTIFERON-TB GOLD PLUS
NIL: 0.07 [IU]/mL
QuantiFERON-TB Gold Plus: NEGATIVE
TB1-NIL: 0.01 [IU]/mL
TB2-NIL: <0 IU/mL

## 2017-11-19 LAB — HIV ANTIBODY (ROUTINE TESTING W REFLEX): HIV 1&2 Ab, 4th Generation: NONREACTIVE

## 2017-11-19 LAB — IGG, IGA, IGM
IGG (IMMUNOGLOBIN G), SERUM: 1018 mg/dL (ref 694–1618)
IGM, SERUM: 56 mg/dL (ref 48–271)
Immunoglobulin A: 531 mg/dL — ABNORMAL HIGH (ref 81–463)

## 2017-11-19 LAB — HEPATITIS B SURFACE ANTIGEN: Hepatitis B Surface Ag: NONREACTIVE

## 2017-11-19 LAB — HEPATITIS B CORE ANTIBODY, IGM: HEP B C IGM: NONREACTIVE

## 2017-11-19 LAB — HEPATITIS C ANTIBODY
Hepatitis C Ab: NONREACTIVE
SIGNAL TO CUT-OFF: 0 (ref <1.00)

## 2017-11-19 LAB — VITAMIN D 25 HYDROXY (VIT D DEFICIENCY, FRACTURES): VIT D 25 HYDROXY: 32 ng/mL (ref 30–100)

## 2017-11-19 NOTE — Progress Notes (Signed)
Will discuss at fu visit

## 2017-11-28 DIAGNOSIS — Z8669 Personal history of other diseases of the nervous system and sense organs: Secondary | ICD-10-CM | POA: Insufficient documentation

## 2017-11-28 DIAGNOSIS — M170 Bilateral primary osteoarthritis of knee: Secondary | ICD-10-CM | POA: Insufficient documentation

## 2017-11-28 DIAGNOSIS — Z96642 Presence of left artificial hip joint: Secondary | ICD-10-CM | POA: Insufficient documentation

## 2017-11-28 NOTE — Progress Notes (Deleted)
Office Visit Note  Patient: [name]             Date of Birth: 01/19/1956           MRN: [id number]             PCP: Donato Schultz, DO Referring: Donato Schultz, * Visit Date: 12/09/2017 Occupation: @GUAROCC @    Subjective:  No chief complaint on file.   History of Present Illness: [name] is a 62 y.o. female ***   Activities of Daily Living:  Patient reports morning stiffness for *** {minute/hour:19697}.   Patient {ACTIONS;DENIES/REPORTS:21021675::"Denies"} nocturnal pain.  Difficulty dressing/grooming: {ACTIONS;DENIES/REPORTS:21021675::"Denies"} Difficulty climbing stairs: {ACTIONS;DENIES/REPORTS:21021675::"Denies"} Difficulty getting out of chair: {ACTIONS;DENIES/REPORTS:21021675::"Denies"} Difficulty using hands for taps, buttons, cutlery, and/or writing: {ACTIONS;DENIES/REPORTS:21021675::"Denies"}   No Rheumatology ROS completed.   PMFS History:  Patient Active Problem List   Diagnosis Date Noted  . OSA (obstructive sleep apnea) 03/08/2016  . Seasonal allergies 05/14/2015  . Obesity (BMI 30-39.9) 03/16/2014  . Long-term current use of steroids 04/20/2013  . Noninfectious gastroenteritis and colitis 03/17/2013  . Calf pain 10/13/2012  . Acute sprain or strain of cervical region 04/10/2012  . LEG CRAMPS, NOCTURNAL 11/14/2010  . OTHER SPECIFIED DISEASE OF WHITE BLOOD CELLS 07/23/2010  . CONSTIPATION 04/06/2010  . CORNEAL ABRASION, LEFT 01/22/2010  . CHEST PAIN UNSPECIFIED 10/11/2009  . HYPOGLYCEMIA, UNSPECIFIED 01/30/2009  . BLOOD IN STOOL 12/02/2008  . ANKYLOSING SPONDYLITIS 03/31/2008  . MUSCLE PAIN 03/31/2008  . BARIATRIC SURGERY STATUS 01/14/2008  . THYROID CYST 06/26/2007  . MORBID OBESITY 06/26/2007  . HYPERTENSION 06/26/2007  . GERD 06/26/2007  . IBS 06/26/2007  . LYMPHADENOPATHY 06/10/2007  . THYROID NODULE, HX OF 06/10/2007    Past Medical History:  Diagnosis Date  . Abdominal pain, other specified site 03/31/2008   Centricity Description: FLANK PAIN, RIGHT Qualifier: Diagnosis of  By: Alwyn Ren MD, Chrissie Noa   Centricity Description: ABDOMINAL PAIN OTHER SPECIFIED SITE Qualifier: Diagnosis of  By: Janit Bern    . Acute sprain or strain of cervical region 04/10/2012  . ALLERGIC RHINITIS 06/26/2007   Qualifier: Diagnosis of  By: Charlsie Quest RMA, Lucy    . ANKYLOSING SPONDYLITIS 03/31/2008   Qualifier: Diagnosis of  By: Alwyn Ren MD, Chrissie Noa    . Ankylosing spondylitis of multiple sites in spine (HCC)   . Arthritis   . Bariatric surgery status 01/14/2008   Qualifier: Diagnosis of  By: Janit Bern    . Blood in stool 12/02/2008   Qualifier: Diagnosis of  By: Janit Bern    . Calf pain 10/13/2012  . CHEST PAIN UNSPECIFIED 10/11/2009   Qualifier: Diagnosis of  By: Arlyce Dice MD, Barbette Hair   . CONSTIPATION 04/06/2010   Qualifier: Diagnosis of  By: Janit Bern    . CORNEAL ABRASION, LEFT 01/22/2010   Qualifier: Diagnosis of  By: Janit Bern    . Gastritis   . GERD 06/26/2007   Qualifier: Diagnosis of  By: Charlsie Quest RMA, Lucy    . GERD (gastroesophageal reflux disease)   . Hiatal hernia   . Hypertension   . Hypoglycemia, unspecified 01/30/2009   Qualifier: Diagnosis of  By: Janit Bern    . IBS 06/26/2007   Qualifier: Diagnosis of  By: Charlsie Quest RMA, Lucy    . LEG CRAMPS, NOCTURNAL 11/14/2010   Qualifier: Diagnosis of  By: Floydene Flock    . Long-term current use of steroids 04/20/2013  . LYMPHADENOPATHY 06/10/2007   Qualifier: Diagnosis  of  By: Janit Bern    . MORBID OBESITY 06/26/2007   Qualifier: Diagnosis of  By: Charlsie Quest RMA, Lucy    . MUSCLE PAIN 03/31/2008   Qualifier: Diagnosis of  By: Alwyn Ren MD, Chrissie Noa    . Noninfectious gastroenteritis and colitis 03/17/2013  . Obesity (BMI 30-39.9) 03/16/2014  . OSA (obstructive sleep apnea) 03/08/2016  . 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 Seasonal allergies 05/14/2015  . THYROID CYST 06/26/2007    Qualifier: Diagnosis of  By: Charlsie Quest RMA, Lucy    . Thyroid nodule   . THYROID NODULE, HX OF 06/10/2007   Qualifier: Diagnosis of  By: Janit Bern      Family History  Problem Relation Age of Onset  . Hypertension Mother   . Arthritis Father   . Hypertension Father   . Diabetes Father   . Melanoma Maternal Grandmother   . Breast cancer Maternal Grandmother        and Aunt  . Hypertension Sister   . Diabetes Sister    Past Surgical History:  Procedure Laterality Date  . BARIATRIC SURGERY    . BREAST REDUCTION SURGERY  1996  . ESOPHAGOGASTRODUODENOSCOPY    . pelvis replacement  2010  . TOTAL HIP ARTHROPLASTY     x 2 left, one revision   Social History   Social History Narrative   Occupation: Sports coach   Daily Caffeine use- 1 cup daily        Objective: Vital Signs: There were no vitals taken for this visit.   Physical Exam   Musculoskeletal Exam: ***  CDAI Exam: No CDAI exam completed.    Investigation: No additional findings. November 17, 2017 CBC normal, CMP normal, immunoglobulins IgA mildly elevated, TB Gold negative, hepatitis B-, hepatitis C negative, HIV negative, vitamin D 32  Imaging: Xr Cervical Spine 2 Or 3 Views  Result Date: 11/17/2017 Squaring of cervical spine vertebrae is was noted.  Syndesmophytes were noted.  Fusion of C1, C2, C3, C4 and C6-C7 was noted.  Fusion of facet joints was noted. Impression: These findings are consistent with ankylosing spondylitis.  Xr Hand 2 View Left  Result Date: 11/17/2017 Minimal PIP DIP and CMC joint narrowing was noted.  No MCP or intercarpal joint space narrowing was noted.  No erosive changes were noted. Impression: These findings are consistent with mild osteoarthritis of the hand.  Xr Hand 2 View Right  Result Date: 11/17/2017 Minimal PIP DIP and CMC joint narrowing was noted.  No MCP or intercarpal joint space narrowing was noted.  No erosive changes were noted. Impression: These findings  are consistent with mild osteoarthritis of the hand.  Xr Knee 3 View Right  Result Date: 11/17/2017 Moderate medial compartment narrowing and lateral osteophytes were noted.  No chondrocalcinosis was noted.  Severe patellofemoral narrowing was noted. Impression: These findings are consistent with moderate osteoarthritis and severe chondromalacia patella.  Xr Lumbar Spine 2-3 Views  Result Date: 11/17/2017 No significant lumbar spine disc space narrowing was noted.  Facet joint arthropathy was noted.  No syndesmophytes were noted.  Xr Pelvis 1-2 Views  Result Date: 11/17/2017 Left SI joint is completely fused.  Right SI joint is partially fused.  Prosthesis was noted in left hip with revision acetabular component. Impression: These findings are consistent with ankylosing spondylitis and left hip prosthesis.   Speciality Comments: No specialty comments available.    Procedures:  No procedures performed Allergies: Aspirin; Codeine; Morphine; and Sulfonamide derivatives   Assessment / Plan:     Visit Diagnoses: No diagnosis found.    Orders: No orders of the defined types were placed in this encounter.  No orders of the defined types were placed in this encounter.   Face-to-face time spent with patient was *** minutes. 50% of time was spent in counseling and coordination of care.  Follow-Up Instructions: No Follow-up on file.   Pollyann SavoyShaili Americus Scheurich, MD  Note - This record has been created using Animal nutritionistDragon software.  Chart creation errors have been sought, but may not always  have been located. Such creation errors do not reflect on  the standard of medical care.

## 2017-12-09 ENCOUNTER — Ambulatory Visit: Payer: Self-pay | Admitting: Rheumatology

## 2017-12-22 NOTE — Progress Notes (Deleted)
Office Visit Note  Patient: [name]             Date of Birth: 1956-05-02           MRN: [id number]             PCP: Donato SchultzLowne Chase, Yvonne R, DO Referring: Donato SchultzLowne Chase, Yvonne R, * Visit Date: 01/05/2018 Occupation: @GUAROCC @    Subjective:  No chief complaint on file.   History of Present Illness: [name] is a 62 y.o. female ***   Activities of Daily Living:  Patient reports morning stiffness for *** {minute/hour:19697}.   Patient {ACTIONS;DENIES/REPORTS:21021675::"Denies"} nocturnal pain.  Difficulty dressing/grooming: {ACTIONS;DENIES/REPORTS:21021675::"Denies"} Difficulty climbing stairs: {ACTIONS;DENIES/REPORTS:21021675::"Denies"} Difficulty getting out of chair: {ACTIONS;DENIES/REPORTS:21021675::"Denies"} Difficulty using hands for taps, buttons, cutlery, and/or writing: {ACTIONS;DENIES/REPORTS:21021675::"Denies"}   No Rheumatology ROS completed.   PMFS History:  Patient Active Problem List   Diagnosis Date Noted  . History of iritis 11/28/2017  . History of total hip replacement, left 11/28/2017  . Primary osteoarthritis of both knees 11/28/2017  . OSA (obstructive sleep apnea) 03/08/2016  . Seasonal allergies 05/14/2015  . Obesity (BMI 30-39.9) 03/16/2014  . Long-term current use of steroids 04/20/2013  . Noninfectious gastroenteritis and colitis 03/17/2013  . Calf pain 10/13/2012  . Acute sprain or strain of cervical region 04/10/2012  . LEG CRAMPS, NOCTURNAL 11/14/2010  . OTHER SPECIFIED DISEASE OF WHITE BLOOD CELLS 07/23/2010  . CONSTIPATION 04/06/2010  . CORNEAL ABRASION, LEFT 01/22/2010  . CHEST PAIN UNSPECIFIED 10/11/2009  . HYPOGLYCEMIA, UNSPECIFIED 01/30/2009  . BLOOD IN STOOL 12/02/2008  . ANKYLOSING SPONDYLITIS 03/31/2008  . MUSCLE PAIN 03/31/2008  . BARIATRIC SURGERY STATUS 01/14/2008  . THYROID CYST 06/26/2007  . MORBID OBESITY 06/26/2007  . Essential hypertension 06/26/2007  . GERD 06/26/2007  . IBS 06/26/2007  .  LYMPHADENOPATHY 06/10/2007  . THYROID NODULE, HX OF 06/10/2007    Past Medical History:  Diagnosis Date  . Abdominal pain, other specified site 03/31/2008   Centricity Description: FLANK PAIN, RIGHT Qualifier: Diagnosis of  By: Alwyn RenHopper MD, Chrissie NoaWilliam   Centricity Description: ABDOMINAL PAIN OTHER SPECIFIED SITE Qualifier: Diagnosis of  By: Janit BernLowne DO, Yvonne    . Acute sprain or strain of cervical region 04/10/2012  . ALLERGIC RHINITIS 06/26/2007   Qualifier: Diagnosis of  By: Charlsie QuestBrand RMA, Lucy    . ANKYLOSING SPONDYLITIS 03/31/2008   Qualifier: Diagnosis of  By: Alwyn RenHopper MD, Chrissie NoaWilliam    . Ankylosing spondylitis of multiple sites in spine (HCC)   . Arthritis   . Bariatric surgery status 01/14/2008   Qualifier: Diagnosis of  By: Janit BernLowne DO, Yvonne    . Blood in stool 12/02/2008   Qualifier: Diagnosis of  By: Janit BernLowne DO, Yvonne    . Calf pain 10/13/2012  . CHEST PAIN UNSPECIFIED 10/11/2009   Qualifier: Diagnosis of  By: Arlyce DiceKaplan MD, Barbette Hairobert D   . CONSTIPATION 04/06/2010   Qualifier: Diagnosis of  By: Janit BernLowne DO, Yvonne    . CORNEAL ABRASION, LEFT 01/22/2010   Qualifier: Diagnosis of  By: Janit BernLowne DO, Yvonne    . Gastritis   . GERD 06/26/2007   Qualifier: Diagnosis of  By: Charlsie QuestBrand RMA, Lucy    . GERD (gastroesophageal reflux disease)   . Hiatal hernia   . Hypertension   . Hypoglycemia, unspecified 01/30/2009   Qualifier: Diagnosis of  By: Janit BernLowne DO, Yvonne    . IBS 06/26/2007   Qualifier: Diagnosis of  By: Charlsie QuestBrand RMA, Lucy    . LEG CRAMPS, NOCTURNAL 11/14/2010  Qualifier: Diagnosis of  By: Floydene Flock    . Long-term current use of steroids 04/20/2013  . LYMPHADENOPATHY 06/10/2007   Qualifier: Diagnosis of  By: Janit Bern    . MORBID OBESITY 06/26/2007   Qualifier: Diagnosis of  By: Charlsie Quest RMA, Lucy    . MUSCLE PAIN 03/31/2008   Qualifier: Diagnosis of  By: Alwyn Ren MD, Chrissie Noa    . Noninfectious gastroenteritis and colitis 03/17/2013  . Obesity (BMI 30-39.9) 03/16/2014  . OSA (obstructive sleep apnea) 03/08/2016  . Other  specified disease of white blood cells 07/23/2010   Qualifier: Diagnosis of  By: Janit Bern    . Seasonal allergies 05/14/2015  . THYROID CYST 06/26/2007   Qualifier: Diagnosis of  By: Charlsie Quest RMA, Lucy    . Thyroid nodule   . THYROID NODULE, HX OF 06/10/2007   Qualifier: Diagnosis of  By: Janit Bern      Family History  Problem Relation Age of Onset  . Hypertension Mother   . Arthritis Father   . Hypertension Father   . Diabetes Father   . Melanoma Maternal Grandmother   . Breast cancer Maternal Grandmother        and Aunt  . Hypertension Sister   . Diabetes Sister    Past Surgical History:  Procedure Laterality Date  . BARIATRIC SURGERY    . BREAST REDUCTION SURGERY  1996  . ESOPHAGOGASTRODUODENOSCOPY    . pelvis replacement  2010  . TOTAL HIP ARTHROPLASTY     x 2 left, one revision   Social History   Social History Narrative   Occupation: Sports coach   Daily Caffeine use- 1 cup daily        Objective: Vital Signs: There were no vitals taken for this visit.   Physical Exam   Musculoskeletal Exam: ***  CDAI Exam: No CDAI exam completed.    Investigation: No additional findings. November 17, 2017 CBC normal, CMP normal, SPEP negative, immunoglobulins normal, hepatitis B-, hepatitis C negative, HIV negative, TB Gold negative, vitamin D 32  Imaging: No results found.  Speciality Comments: No specialty comments available.    Procedures:  No procedures performed Allergies: Aspirin; Codeine; Morphine; and Sulfonamide derivatives   Assessment / Plan:     Visit Diagnoses: No diagnosis found.    Orders: No orders of the defined types were placed in this encounter.  No orders of the defined types were placed in this encounter.   Face-to-face time spent with patient was *** minutes. 50% of time was spent in counseling and coordination of care.  Follow-Up Instructions: No follow-ups on file.   Pollyann Savoy, MD  Note - This  record has been created using Animal nutritionist.  Chart creation errors have been sought, but may not always  have been located. Such creation errors do not reflect on  the standard of medical care.

## 2018-01-05 ENCOUNTER — Ambulatory Visit: Payer: Self-pay | Admitting: Rheumatology

## 2018-01-23 DIAGNOSIS — M1711 Unilateral primary osteoarthritis, right knee: Secondary | ICD-10-CM | POA: Insufficient documentation

## 2018-01-23 DIAGNOSIS — M19042 Primary osteoarthritis, left hand: Secondary | ICD-10-CM | POA: Insufficient documentation

## 2018-01-23 DIAGNOSIS — M19041 Primary osteoarthritis, right hand: Secondary | ICD-10-CM | POA: Insufficient documentation

## 2018-01-23 NOTE — Progress Notes (Deleted)
Office Visit Note  Patient: [name]             Date of Birth: 03-Mar-1956           MRN: [id number]             PCP: Donato Schultz, DO Referring: Donato Schultz, * Visit Date: 01/30/2018 Occupation: @GUAROCC @    Subjective:  No chief complaint on file.   History of Present Illness: [name] is a 62 y.o. female ***   Activities of Daily Living:  Patient reports morning stiffness for *** {minute/hour:19697}.   Patient {ACTIONS;DENIES/REPORTS:21021675::"Denies"} nocturnal pain.  Difficulty dressing/grooming: {ACTIONS;DENIES/REPORTS:21021675::"Denies"} Difficulty climbing stairs: {ACTIONS;DENIES/REPORTS:21021675::"Denies"} Difficulty getting out of chair: {ACTIONS;DENIES/REPORTS:21021675::"Denies"} Difficulty using hands for taps, buttons, cutlery, and/or writing: {ACTIONS;DENIES/REPORTS:21021675::"Denies"}   No Rheumatology ROS completed.   PMFS History:  Patient Active Problem List   Diagnosis Date Noted  . Primary osteoarthritis of right knee 01/23/2018  . Primary osteoarthritis of both hands 01/23/2018  . History of iritis 11/28/2017  . History of total hip replacement, left 11/28/2017  . Primary osteoarthritis of both knees 11/28/2017  . OSA (obstructive sleep apnea) 03/08/2016  . Seasonal allergies 05/14/2015  . Obesity (BMI 30-39.9) 03/16/2014  . Long-term current use of steroids 04/20/2013  . Noninfectious gastroenteritis and colitis 03/17/2013  . Calf pain 10/13/2012  . Acute sprain or strain of cervical region 04/10/2012  . LEG CRAMPS, NOCTURNAL 11/14/2010  . OTHER SPECIFIED DISEASE OF WHITE BLOOD CELLS 07/23/2010  . CONSTIPATION 04/06/2010  . CORNEAL ABRASION, LEFT 01/22/2010  . CHEST PAIN UNSPECIFIED 10/11/2009  . HYPOGLYCEMIA, UNSPECIFIED 01/30/2009  . BLOOD IN STOOL 12/02/2008  . ANKYLOSING SPONDYLITIS 03/31/2008  . MUSCLE PAIN 03/31/2008  . BARIATRIC SURGERY STATUS 01/14/2008  . THYROID CYST 06/26/2007  . MORBID OBESITY  06/26/2007  . Essential hypertension 06/26/2007  . GERD 06/26/2007  . IBS 06/26/2007  . LYMPHADENOPATHY 06/10/2007  . THYROID NODULE, HX OF 06/10/2007    Past Medical History:  Diagnosis Date  . Abdominal pain, other specified site 03/31/2008   Centricity Description: FLANK PAIN, RIGHT Qualifier: Diagnosis of  By: Alwyn Ren MD, Chrissie Noa   Centricity Description: ABDOMINAL PAIN OTHER SPECIFIED SITE Qualifier: Diagnosis of  By: Janit Bern    . Acute sprain or strain of cervical region 04/10/2012  . ALLERGIC RHINITIS 06/26/2007   Qualifier: Diagnosis of  By: Charlsie Quest RMA, Lucy    . ANKYLOSING SPONDYLITIS 03/31/2008   Qualifier: Diagnosis of  By: Alwyn Ren MD, Chrissie Noa    . Ankylosing spondylitis of multiple sites in spine (HCC)   . Arthritis   . Bariatric surgery status 01/14/2008   Qualifier: Diagnosis of  By: Janit Bern    . Blood in stool 12/02/2008   Qualifier: Diagnosis of  By: Janit Bern    . Calf pain 10/13/2012  . CHEST PAIN UNSPECIFIED 10/11/2009   Qualifier: Diagnosis of  By: Arlyce Dice MD, Barbette Hair   . CONSTIPATION 04/06/2010   Qualifier: Diagnosis of  By: Janit Bern    . CORNEAL ABRASION, LEFT 01/22/2010   Qualifier: Diagnosis of  By: Janit Bern    . Gastritis   . GERD 06/26/2007   Qualifier: Diagnosis of  By: Charlsie Quest RMA, Lucy    . GERD (gastroesophageal reflux disease)   . Hiatal hernia   . Hypertension   . Hypoglycemia, unspecified 01/30/2009   Qualifier: Diagnosis of  By: Janit Bern    . IBS 06/26/2007   Qualifier: Diagnosis  of  By: Tyrone AppleBrand RMA, Valentina GuLucy    . LEG CRAMPS, NOCTURNAL 11/14/2010   Qualifier: Diagnosis of  By: Floydene FlockHoops, Regina    . Long-term current use of steroids 04/20/2013  . LYMPHADENOPATHY 06/10/2007   Qualifier: Diagnosis of  By: Janit BernLowne DO, Yvonne    . MORBID OBESITY 06/26/2007   Qualifier: Diagnosis of  By: Charlsie QuestBrand RMA, Lucy    . MUSCLE PAIN 03/31/2008   Qualifier: Diagnosis of  By: Alwyn RenHopper MD, Chrissie NoaWilliam    . Noninfectious gastroenteritis and colitis  03/17/2013  . Obesity (BMI 30-39.9) 03/16/2014  . OSA (obstructive sleep apnea) 03/08/2016  . 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/23/2010   Qualifier: Diagnosis of  By: Janit BernLowne DO, Yvonne    . Seasonal allergies 05/14/2015  . THYROID CYST 06/26/2007   Qualifier: Diagnosis of  By: Charlsie QuestBrand RMA, Lucy    . Thyroid nodule   . THYROID NODULE, HX OF 06/10/2007   Qualifier: Diagnosis of  By: Janit BernLowne DO, Yvonne      Family History  Problem Relation Age of Onset  . Hypertension Mother   . Arthritis Father   . Hypertension Father   . Diabetes Father   . Melanoma Maternal Grandmother   . Breast cancer Maternal Grandmother        and Aunt  . Hypertension Sister   . Diabetes Sister    Past Surgical History:  Procedure Laterality Date  . BARIATRIC SURGERY    . BREAST REDUCTION SURGERY  1996  . ESOPHAGOGASTRODUODENOSCOPY    . pelvis replacement  2010  . TOTAL HIP ARTHROPLASTY     x 2 left, one revision   Social History   Social History Narrative   Occupation: Sports coachQuality Consultant   Daily Caffeine use- 1 cup daily        Objective: Vital Signs: There were no vitals taken for this visit.   Physical Exam   Musculoskeletal Exam: ***  CDAI Exam: No CDAI exam completed.    Investigation: No additional findings. CBC Latest Ref Rng & Units 11/17/2017 06/11/2016 05/11/2015  WBC 3.8 - 10.8 Thousand/uL 4.3 7.7 4.1  Hemoglobin 11.7 - 15.5 g/dL 13.013.1 86.513.5 78.413.5  Hematocrit 35.0 - 45.0 % 38.6 40.0 41.4  Platelets 140 - 400 Thousand/uL 330 361.0 328.0   CMP     Component Value Date/Time   NA 139 11/17/2017 1025   K 4.3 11/17/2017 1025   CL 105 11/17/2017 1025   CO2 26 11/17/2017 1025   GLUCOSE 92 11/17/2017 1025   BUN 12 11/17/2017 1025   CREATININE 0.55 11/17/2017 1025   CALCIUM 9.2 11/17/2017 1025   CALCIUM 9.1 02/04/2007 2305   PROT 6.7 11/17/2017 1025   PROT 6.9 11/17/2017 1025   ALBUMIN 4.3 06/11/2016 1044   AST 20 11/17/2017 1025   ALT 20 11/17/2017 1025   ALKPHOS 67  06/11/2016 1044   BILITOT 0.6 11/17/2017 1025   GFRNONAA 103 11/17/2017 1025   GFRAA 119 11/17/2017 1025  November 17, 2017 SPEP normal, TB Gold negative, immunoglobulins normal, hepatitis B-, hepatitis C negative, HIV negative, vitamin D 32  Imaging: No results found.  Speciality Comments: No specialty comments available.    Procedures:  No procedures performed Allergies: Aspirin; Codeine; Morphine; and Sulfonamide derivatives   Assessment / Plan:     Visit Diagnoses: Ankylosing spondylitis of multiple sites in spine (HCC) - We discussed Humira during the last visit due to history of iritis.  Patient was offered Enbrel by previous rheumatologist twice which she declined.  History of iritis  History of total hip replacement, left - At age 14, revision x2 then in 2010 hip and pelvis replacement in [place].  Primary osteoarthritis of right knee - Moderate with severe chondromalacia patella  Primary osteoarthritis of both hands - mild    History of gastritis  History of gastroesophageal reflux (GERD)  OSA (obstructive sleep apnea)  Thyroid nodule  History of bariatric surgery    Orders: No orders of the defined types were placed in this encounter.  No orders of the defined types were placed in this encounter.   Face-to-face time spent with patient was *** minutes. 50% of time was spent in counseling and coordination of care.  Follow-Up Instructions: No follow-ups on file.   Pollyann Savoy, MD  Note - This record has been created using Animal nutritionist.  Chart creation errors have been sought, but may not always  have been located. Such creation errors do not reflect on  the standard of medical care.

## 2018-01-30 ENCOUNTER — Ambulatory Visit: Payer: Self-pay | Admitting: Rheumatology

## 2018-02-05 NOTE — Progress Notes (Signed)
Subjective:   [name] is a 62 y.o. female who presents for Medicare Annual (Subsequent) preventive examination.  Review of Systems: No ROS.  Medicare Wellness Visit. Additional risk factors are reflected in the social history. Cardiac Risk Factors include: advanced age (>56men, >72 women);hypertension Sleep patterns:  Sleeps about 6 hrs per night. Wakes occasionally with hot flashes. Home Safety/Smoke Alarms: Feels safe in home. Smoke alarms in place.  Living environment; residence and Firearm Safety:   Lives with husband in 2 story home.               Female:   Pap-2018 with Dr. Aram Candela- Nov 2018 normal per pt                                                                                  [facility]-last reported 2011. 75yr recall. Dr.Kaplan. Dr.ARmbruster now.  Yearly eye exam-[facility]-every 6 months. Dr.Novak    Objective:     Vitals: BP 138/80 (BP Location: Left Arm, Patient Position: Sitting, Cuff Size: Normal)   Pulse 64   Ht  (1.575 m)   Wt 201 lb 12.8 oz (91.5 kg)   SpO2 98%   BMI 36.91 kg/m   Body mass index is 36.91 kg/m.  Advanced Directives 02/09/2018 03/07/2016 03/05/2016  Does Patient Have a Medical Advance Directive? No No No  Would patient like information on creating a medical advance directive? Yes (MAU/Ambulatory/Procedural Areas - Information given) No - patient declined information No - patient declined information    Tobacco Social History   Tobacco Use  Smoking Status Never Smoker  Smokeless Tobacco Never Used     Counseling given: Not Answered   Clinical Intake: Pain : No/denies pain    Past Medical History:  Diagnosis Date  . Abdominal pain, other specified site 03/31/2008   Centricity Description: FLANK PAIN, RIGHT Qualifier: Diagnosis of  By: Alwyn Ren MD, Chrissie Noa   Centricity Description: ABDOMINAL PAIN OTHER SPECIFIED SITE Qualifier: Diagnosis of  By: Janit Bern    . Acute sprain or strain of cervical  region 04/10/2012  . ALLERGIC RHINITIS 06/26/2007   Qualifier: Diagnosis of  By: Charlsie Quest RMA, Lucy    . ANKYLOSING SPONDYLITIS 03/31/2008   Qualifier: Diagnosis of  By: Alwyn Ren MD, Chrissie Noa    . Ankylosing spondylitis of multiple sites in spine (HCC)   . Arthritis   . Bariatric surgery status 01/14/2008   Qualifier: Diagnosis of  By: Janit Bern    . Blood in stool 12/02/2008   Qualifier: Diagnosis of  By: Janit Bern    . Calf pain 10/13/2012  . CHEST PAIN UNSPECIFIED 10/11/2009   Qualifier: Diagnosis of  By: Arlyce Dice MD, Barbette Hair   . CONSTIPATION 04/06/2010   Qualifier: Diagnosis of  By: Janit Bern    . CORNEAL ABRASION, LEFT 01/22/2010   Qualifier: Diagnosis of  By: Janit Bern    . Gastritis   . GERD 06/26/2007   Qualifier: Diagnosis of  By: Charlsie Quest RMA, Lucy    . GERD (gastroesophageal reflux disease)   . Hiatal hernia   . Hypertension   . Hypoglycemia,  unspecified 01/30/2009   Qualifier: Diagnosis of  By: Janit Bern    . IBS 06/26/2007   Qualifier: Diagnosis of  By: Charlsie Quest RMA, Lucy    . LEG CRAMPS, NOCTURNAL 11/14/2010   Qualifier: Diagnosis of  By: Floydene Flock    . Long-term current use of steroids 04/20/2013  . LYMPHADENOPATHY 06/10/2007   Qualifier: Diagnosis of  By: Janit Bern    . MORBID OBESITY 06/26/2007   Qualifier: Diagnosis of  By: Charlsie Quest RMA, Lucy    . MUSCLE PAIN 03/31/2008   Qualifier: Diagnosis of  By: Alwyn Ren MD, Chrissie Noa    . Noninfectious gastroenteritis and colitis 03/17/2013  . Obesity (BMI 30-39.9) 03/16/2014  . OSA (obstructive sleep apnea) 03/08/2016  . 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 Seasonal allergies 05/14/2015  . THYROID CYST 06/26/2007   Qualifier: Diagnosis of  By: Charlsie Quest RMA, Lucy    . Thyroid nodule   . THYROID NODULE, HX OF 06/10/2007   Qualifier: Diagnosis of  By: Janit Bern     Past Surgical History:  Procedure Laterality Date  . BARIATRIC SURGERY      . BREAST REDUCTION SURGERY  1996  . ESOPHAGOGASTRODUODENOSCOPY    . pelvis replacement  2010  . TOTAL HIP ARTHROPLASTY     x 2 left, one revision   Family History  Problem Relation Age of Onset  . Hypertension Mother   . Arthritis Father   . Hypertension Father   . Diabetes Father   . Melanoma Maternal Grandmother   . Breast cancer Maternal Grandmother        and Aunt  . Hypertension Sister   . Diabetes Sister    Social History   Socioeconomic History  . Marital status: Married    Spouse name: Not on file  . Number of children: Not on file  . Years of education: Not on file  . Highest education level: Not on file  Occupational History  . Occupation: retired  Engineer, production  . Financial resource strain: Not on file  . Food insecurity:    Worry: Not on file    Inability: Not on file  . Transportation needs:    Medical: Not on file    Non-medical: Not on file  Tobacco Use  . Smoking status: Never Smoker  . Smokeless tobacco: Never Used  Substance and Sexual Activity  . Alcohol use: No    Alcohol/week: 0.0 oz  . Drug use: No  . Sexual activity: Yes  Lifestyle  . Physical activity:    Days per week: Not on file    Minutes per session: Not on file  . Stress: Not on file  Relationships  . Social connections:    Talks on phone: Not on file    Gets together: Not on file    Attends religious service: Not on file    Active member of club or organization: Not on file    Attends meetings of clubs or organizations: Not on file    Relationship status: Not on file  Other Topics Concern  . Not on file  Social History Narrative   Occupation: Sports coach   Daily Caffeine use- 1 cup daily       Outpatient Encounter Medications as of 02/09/2018  Medication Sig  . Acetaminophen (TYLENOL EXTRA STRENGTH PO) Take by mouth as needed.   . Biotin 5000 MCG TABS Take 1 tablet  by mouth daily.  . calcium carbonate 200 MG capsule Take 250 mg by mouth 2 (two) times daily with  a meal.    . ferrous sulfate 325 (65 FE) MG EC tablet Take 1 tablet by mouth daily.  Marland Kitchen ipratropium (ATROVENT) 0.06 % nasal spray Place 2 sprays into both nostrils 4 (four) times daily.  . multivitamin-iron-minerals-folic acid (CENTRUM) chewable tablet Chew 1 tablet by mouth daily.  Marland Kitchen NEXIUM 40 MG capsule TAKE 1 CAPSULE (40 MG TOTAL) BY MOUTH DAILY.  . ONE TOUCH ULTRA TEST test strip USE ONCE DAILY  . POTASSIUM CHLORIDE CR PO Take 595 mg by mouth.   . predniSONE (DELTASONE) 5 MG tablet Take 5 mg by mouth daily with breakfast.  . pyridoxine (B-6) 100 MG tablet Take 100 mg by mouth daily.  . sucralfate (CARAFATE) 1 g tablet Take 1 tablet by mouth as needed.   . brompheniramine-pseudoephedrine-DM 30-2-10 MG/5ML syrup Take 10 mLs by mouth 4 (four) times daily as needed. (Patient not taking: Reported on 11/17/2017)  . FLUOCINOLONE ACETONIDE SCALP 0.01 % OIL as needed. Reported on 04/12/2016  . Phentermine-Topiramate (QSYMIA) 7.5-46 MG CP24 Take by mouth. Reported on 04/12/2016   No facility-administered encounter medications on file as of 02/09/2018.     Activities of Daily Living In your present state of health, do you have any difficulty performing the following activities: 02/09/2018  Hearing? N  Vision? N  Comment wears glasses.  Difficulty concentrating or making decisions? N  Walking or climbing stairs? N  Dressing or bathing? N  Doing errands, shopping? N  Preparing Food and eating ? N  Using the Toilet? N  In the past six months, have you accidently leaked urine? N  Do you have problems with loss of bowel control? N  Managing your Medications? N  Managing your Finances? N  Housekeeping or managing your Housekeeping? N  Some recent data might be hidden    Patient Care Team: Zola Button, Grayling Congress, DO as PCP - General Candice Camp, MD as Consulting Physician (Obstetrics and Gynecology) Cameron Proud, Onalee Hua, MD as Referring Physician (Surgery) Coralyn Helling, MD as Consulting Physician  (Pulmonary Disease) Pollyann Savoy, MD as Consulting Physician (Rheumatology) Jake Bathe, MD as Consulting Physician (Cardiology)    Assessment:   This is a routine wellness examination for [place]. Physical assessment deferred to PCP.   Exercise Activities and Dietary recommendations Current Exercise Habits: The patient does not participate in regular exercise at present, Exercise limited by: None identified   Diet (meal preparation, eat out, water intake, caffeinated beverages, dairy products, fruits and vegetables): well balanced Breakfast: sausage, grits, egg white. Coffee Lunch: potato salad, chicken, greens, roll Late snack: chk leg, potato salad, fruit  Goals    . Drink at least 6 glasses of water per day.    . Increase physical activity     Work out 3x/ week    . Weight (lb) < 150 lb (68 kg)       Fall Risk Fall Risk  02/09/2018 03/05/2016  Falls in the past year? Yes Yes  Comment - Larey Seat down steps and tripped over raised area on the road.   Number falls in past yr: 2 or more 2 or more  Injury with Fall? No No  Risk for fall due to : History of fall(s) Impaired balance/gait;History of fall(s)  Follow up Education provided;Falls prevention discussed Education provided;Falls prevention discussed    Depression Screen PHQ 2/9 Scores 02/09/2018 06/11/2016 03/05/2016  PHQ - 2  Score 0 0 1     Cognitive Function MMSE - Mini Mental State Exam 03/05/2016  Orientation to time 5  Orientation to Place 5  Registration 3  Attention/ Calculation 5  Recall 3  Language- name 2 objects 2  Language- repeat 1  Language- follow 3 step command 3  Language- read & follow direction 1  Write a sentence 1  Copy design 1  Total score 30        Immunization History  Administered Date(s) Administered  . Influenza Whole 06/22/2010  . Tdap 03/16/2014   Screening Tests Health Maintenance  Topic Date Due  . MAMMOGRAM  08/12/2017  . INFLUENZA VACCINE  04/30/2018  . PAP SMEAR   08/12/2018  . COLONOSCOPY  10/25/2019  . TETANUS/TDAP  03/16/2024  . Hepatitis C Screening  Completed  . HIV Screening  Completed        Plan:   Follow up with Dr.Lowne 07-02-18  Continue to eat heart healthy diet (full of fruits, vegetables, whole grains, lean protein, water--limit salt, fat, and sugar intake) and increase physical activity as tolerated.  Continue doing brain stimulating activities (puzzles, reading, adult coloring books, staying active) to keep memory sharp.     I have personally reviewed and noted the following in the patient's chart:   . Medical and social history . Use of alcohol, tobacco or illicit drugs  . Current medications and supplements . Functional ability and status . Nutritional status . Physical activity . Advanced directives . List of other physicians . Hospitalizations, surgeries, and ER visits in previous 12 months . Vitals . Screenings to include cognitive, depression, and falls . Referrals and appointments  In addition, I have reviewed and discussed with patient certain preventive protocols, quality metrics, and best practice recommendations. A written personalized care plan for preventive services as well as general preventive health recommendations were provided to patient.     [place]on Gully, [place]  02/09/2018

## 2018-02-09 ENCOUNTER — Ambulatory Visit (INDEPENDENT_AMBULATORY_CARE_PROVIDER_SITE_OTHER): Payer: Medicare Other | Admitting: *Deleted

## 2018-02-09 ENCOUNTER — Encounter: Payer: Self-pay | Admitting: *Deleted

## 2018-02-09 VITALS — BP 138/80 | HR 64 | Ht 62.0 in | Wt 201.8 lb

## 2018-02-09 DIAGNOSIS — Z0000 Encounter for general adult medical examination without abnormal findings: Secondary | ICD-10-CM | POA: Diagnosis not present

## 2018-02-09 NOTE — Patient Instructions (Signed)
Follow up with Dr.Lowne 07-02-18  Continue to eat heart healthy diet (full of fruits, vegetables, whole grains, lean protein, water--limit salt, fat, and sugar intake) and increase physical activity as tolerated.  Continue doing brain stimulating activities (puzzles, reading, adult coloring books, staying active) to keep memory sharp.    [name] , Thank you for taking time to come for your Medicare Wellness Visit. I appreciate your ongoing commitment to your health goals. Please review the following plan we discussed and let me know if I can assist you in the future.   These are the goals we discussed: Goals    . Drink at least 6 glasses of water per day.    . Increase physical activity     Work out 3x/ week    . Weight (lb) < 150 lb (68 kg)       This is a list of the screening recommended for you and due dates:  Health Maintenance  Topic Date Due  . Mammogram  08/12/2017  . Flu Shot  04/30/2018  . Pap Smear  08/12/2018  . Colon Cancer Screening  10/25/2019  . Tetanus Vaccine  03/16/2024  .  Hepatitis C: One time screening is recommended by Center for Disease Control  (CDC) for  adults born from [birth year] through 1963.   Completed  . HIV Screening  Completed    Health Maintenance for Postmenopausal Women Menopause is a normal process in which your reproductive ability comes to an end. This process happens gradually over a span of months to years, usually between the ages of 32 and 95. Menopause is complete when you have missed 12 consecutive menstrual periods. It is important to talk with your health care provider about some of the most common conditions that affect postmenopausal women, such as heart disease, cancer, and bone loss (osteoporosis). Adopting a healthy lifestyle and getting preventive care can help to promote your health and wellness. Those actions can also lower your chances of developing some of these common conditions. What should I know about menopause? During  menopause, you may experience a number of symptoms, such as:  Moderate-to-severe hot flashes.  Night sweats.  Decrease in sex drive.  Mood swings.  Headaches.  Tiredness.  Irritability.  Memory problems.  Insomnia.  Choosing to treat or not to treat menopausal changes is an individual decision that you make with your health care provider. What should I know about hormone replacement therapy and supplements? Hormone therapy products are effective for treating symptoms that are associated with menopause, such as hot flashes and night sweats. Hormone replacement carries certain risks, especially as you become older. If you are thinking about using estrogen or estrogen with progestin treatments, discuss the benefits and risks with your health care provider. What should I know about heart disease and stroke? Heart disease, heart attack, and stroke become more likely as you age. This may be due, in part, to the hormonal changes that your body experiences during menopause. These can affect how your body processes dietary fats, triglycerides, and cholesterol. Heart attack and stroke are both medical emergencies. There are many things that you can do to help prevent heart disease and stroke:  Have your blood pressure checked at least every 1-2 years. High blood pressure causes heart disease and increases the risk of stroke.  If you are 72-35 years old, ask your health care provider if you should take aspirin to prevent a heart attack or a stroke.  Do not use any tobacco products,  including cigarettes, chewing tobacco, or electronic cigarettes. If you need help quitting, ask your health care provider.  It is important to eat a healthy diet and maintain a healthy weight. ? Be sure to include plenty of vegetables, fruits, low-fat dairy products, and lean protein. ? Avoid eating foods that are high in solid fats, added sugars, or salt (sodium).  Get regular exercise. This is one of the most  important things that you can do for your health. ? Try to exercise for at least 150 minutes each week. The type of exercise that you do should increase your heart rate and make you sweat. This is known as moderate-intensity exercise. ? Try to do strengthening exercises at least twice each week. Do these in addition to the moderate-intensity exercise.  Know your numbers.Ask your health care provider to check your cholesterol and your blood glucose. Continue to have your blood tested as directed by your health care provider.  What should I know about cancer screening? There are several types of cancer. Take the following steps to reduce your risk and to catch any cancer development as early as possible. Breast Cancer  Practice breast self-awareness. ? This means understanding how your breasts normally appear and feel. ? It also means doing regular breast self-exams. Let your health care provider know about any changes, no matter how small.  If you are 68 or older, have a clinician do a breast exam (clinical breast exam or CBE) every year. Depending on your age, family history, and medical history, it may be recommended that you also have a yearly breast X-ray (mammogram).  If you have a family history of breast cancer, talk with your health care provider about genetic screening.  If you are at high risk for breast cancer, talk with your health care provider about having an MRI and a mammogram every year.  Breast cancer (BRCA) gene test is recommended for women who have family members with BRCA-related cancers. Results of the assessment will determine the need for genetic counseling and BRCA1 and for BRCA2 testing. BRCA-related cancers include these types: ? Breast. This occurs in males or females. ? Ovarian. ? Tubal. This may also be called fallopian tube cancer. ? Cancer of the abdominal or pelvic lining (peritoneal cancer). ? Prostate. ? Pancreatic.  Cervical, Uterine, and Ovarian  Cancer Your health care provider may recommend that you be screened regularly for cancer of the pelvic organs. These include your ovaries, uterus, and vagina. This screening involves a pelvic exam, which includes checking for microscopic changes to the surface of your cervix (Pap test).  For women ages 21-65, health care providers may recommend a pelvic exam and a Pap test every three years. For women ages 19-65, they may recommend the Pap test and pelvic exam, combined with testing for human papilloma virus (HPV), every five years. Some types of HPV increase your risk of cervical cancer. Testing for HPV may also be done on women of any age who have unclear Pap test results.  Other health care providers may not recommend any screening for nonpregnant women who are considered low risk for pelvic cancer and have no symptoms. Ask your health care provider if a screening pelvic exam is right for you.  If you have had past treatment for cervical cancer or a condition that could lead to cancer, you need Pap tests and screening for cancer for at least 20 years after your treatment. If Pap tests have been discontinued for you, your risk factors (such  as having a new sexual partner) need to be reassessed to determine if you should start having screenings again. Some women have medical problems that increase the chance of getting cervical cancer. In these cases, your health care provider may recommend that you have screening and Pap tests more often.  If you have a family history of uterine cancer or ovarian cancer, talk with your health care provider about genetic screening.  If you have vaginal bleeding after reaching menopause, tell your health care provider.  There are currently no reliable tests available to screen for ovarian cancer.  Lung Cancer Lung cancer screening is recommended for adults 23-19 years old who are at high risk for lung cancer because of a history of smoking. A yearly low-dose CT scan  of the lungs is recommended if you:  Currently smoke.  Have a history of at least 30 pack-years of smoking and you currently smoke or have quit within the past 15 years. A pack-year is smoking an average of one pack of cigarettes per day for one year.  Yearly screening should:  Continue until it has been 15 years since you quit.  Stop if you develop a health problem that would prevent you from having lung cancer treatment.  Colorectal Cancer  This type of cancer can be detected and can often be prevented.  Routine colorectal cancer screening usually begins at age 11 and continues through age 34.  If you have risk factors for colon cancer, your health care provider may recommend that you be screened at an earlier age.  If you have a family history of colorectal cancer, talk with your health care provider about genetic screening.  Your health care provider may also recommend using home test kits to check for hidden blood in your stool.  A small camera at the end of a tube can be used to examine your colon directly (sigmoidoscopy or colonoscopy). This is done to check for the earliest forms of colorectal cancer.  Direct examination of the colon should be repeated every 5-10 years until age 72. However, if early forms of precancerous polyps or small growths are found or if you have a family history or genetic risk for colorectal cancer, you may need to be screened more often.  Skin Cancer  Check your skin from head to toe regularly.  Monitor any moles. Be sure to tell your health care provider: ? About any new moles or changes in moles, especially if there is a change in a mole's shape or color. ? If you have a mole that is larger than the size of a pencil eraser.  If any of your family members has a history of skin cancer, especially at a young age, talk with your health care provider about genetic screening.  Always use sunscreen. Apply sunscreen liberally and repeatedly  throughout the day.  Whenever you are outside, protect yourself by wearing long sleeves, pants, a wide-brimmed hat, and sunglasses.  What should I know about osteoporosis? Osteoporosis is a condition in which bone destruction happens more quickly than new bone creation. After menopause, you may be at an increased risk for osteoporosis. To help prevent osteoporosis or the bone fractures that can happen because of osteoporosis, the following is recommended:  If you are 25-54 years old, get at least 1,000 mg of calcium and at least 600 mg of vitamin D per day.  If you are older than age 24 but younger than age 23, get at least 1,200 mg of calcium  and at least 600 mg of vitamin D per day.  If you are older than age 88, get at least 1,200 mg of calcium and at least 800 mg of vitamin D per day.  Smoking and excessive alcohol intake increase the risk of osteoporosis. Eat foods that are rich in calcium and vitamin D, and do weight-bearing exercises several times each week as directed by your health care provider. What should I know about how menopause affects my mental health? Depression may occur at any age, but it is more common as you become older. Common symptoms of depression include:  Low or sad mood.  Changes in sleep patterns.  Changes in appetite or eating patterns.  Feeling an overall lack of motivation or enjoyment of activities that you previously enjoyed.  Frequent crying spells.  Talk with your health care provider if you think that you are experiencing depression. What should I know about immunizations? It is important that you get and maintain your immunizations. These include:  Tetanus, diphtheria, and pertussis (Tdap) booster vaccine.  Influenza every year before the flu season begins.  Pneumonia vaccine.  Shingles vaccine.  Your health care provider may also recommend other immunizations. This information is not intended to replace advice given to you by your health  care provider. Make sure you discuss any questions you have with your health care provider. Document Released: 11/08/2005 Document Revised: 04/05/2016 Document Reviewed: 06/20/2015 Elsevier Interactive Patient Education  2018 [employer].

## 2018-02-12 ENCOUNTER — Encounter: Payer: Self-pay | Admitting: Family Medicine

## 2018-02-17 ENCOUNTER — Encounter (INDEPENDENT_AMBULATORY_CARE_PROVIDER_SITE_OTHER): Payer: Medicare Other

## 2018-02-18 ENCOUNTER — Ambulatory Visit (INDEPENDENT_AMBULATORY_CARE_PROVIDER_SITE_OTHER): Payer: Medicare Other | Admitting: Family Medicine

## 2018-02-18 ENCOUNTER — Encounter (INDEPENDENT_AMBULATORY_CARE_PROVIDER_SITE_OTHER): Payer: Self-pay | Admitting: Family Medicine

## 2018-02-18 VITALS — BP 121/79 | HR 62 | Temp 97.9°F | Ht 62.0 in | Wt 196.0 lb

## 2018-02-18 DIAGNOSIS — E559 Vitamin D deficiency, unspecified: Secondary | ICD-10-CM | POA: Diagnosis not present

## 2018-02-18 DIAGNOSIS — Z6836 Body mass index (BMI) 36.0-36.9, adult: Secondary | ICD-10-CM | POA: Diagnosis not present

## 2018-02-18 DIAGNOSIS — R5383 Other fatigue: Secondary | ICD-10-CM | POA: Diagnosis not present

## 2018-02-18 DIAGNOSIS — R0602 Shortness of breath: Secondary | ICD-10-CM

## 2018-02-18 DIAGNOSIS — Z1331 Encounter for screening for depression: Secondary | ICD-10-CM

## 2018-02-18 DIAGNOSIS — R739 Hyperglycemia, unspecified: Secondary | ICD-10-CM | POA: Diagnosis not present

## 2018-02-18 DIAGNOSIS — M459 Ankylosing spondylitis of unspecified sites in spine: Secondary | ICD-10-CM | POA: Diagnosis not present

## 2018-02-18 DIAGNOSIS — E66812 Obesity, class 2: Secondary | ICD-10-CM

## 2018-02-18 DIAGNOSIS — Z0289 Encounter for other administrative examinations: Secondary | ICD-10-CM

## 2018-02-19 LAB — CBC WITH DIFFERENTIAL
Basophils Absolute: 0 10*3/uL (ref 0.0–0.2)
Basos: 0 %
EOS (ABSOLUTE): 0.1 10*3/uL (ref 0.0–0.4)
EOS: 1 %
HEMATOCRIT: 43 % (ref 34.0–46.6)
HEMOGLOBIN: 13.9 g/dL (ref 11.1–15.9)
IMMATURE GRANULOCYTES: 0 %
Immature Grans (Abs): 0 10*3/uL (ref 0.0–0.1)
Lymphocytes Absolute: 2.6 10*3/uL (ref 0.7–3.1)
Lymphs: 51 %
MCH: 31.8 pg (ref 26.6–33.0)
MCHC: 32.3 g/dL (ref 31.5–35.7)
MCV: 98 fL — ABNORMAL HIGH (ref 79–97)
MONOCYTES: 11 %
MONOS ABS: 0.6 10*3/uL (ref 0.1–0.9)
Neutrophils Absolute: 1.9 10*3/uL (ref 1.4–7.0)
Neutrophils: 37 %
RBC: 4.37 x10E6/uL (ref 3.77–5.28)
RDW: 13.7 % (ref 12.3–15.4)
WBC: 5.1 10*3/uL (ref 3.4–10.8)

## 2018-02-19 LAB — COMPREHENSIVE METABOLIC PANEL
ALT: 22 IU/L (ref 0–32)
AST: 20 IU/L (ref 0–40)
Albumin/Globulin Ratio: 1.4 (ref 1.2–2.2)
Albumin: 4.1 g/dL (ref 3.5–5.5)
Alkaline Phosphatase: 74 IU/L (ref 39–117)
BUN/Creatinine Ratio: 13 (ref 9–23)
BUN: 8 mg/dL (ref 6–24)
Bilirubin Total: 0.7 mg/dL (ref 0.0–1.2)
CALCIUM: 9.4 mg/dL (ref 8.7–10.2)
CO2: 23 mmol/L (ref 20–29)
Chloride: 103 mmol/L (ref 96–106)
Creatinine, Ser: 0.62 mg/dL (ref 0.57–1.00)
GFR calc Af Amer: 114 mL/min/{1.73_m2} (ref >59)
GFR, EST NON AFRICAN AMERICAN: 99 mL/min/{1.73_m2} (ref >59)
GLOBULIN, TOTAL: 2.9 g/dL (ref 1.5–4.5)
GLUCOSE: 77 mg/dL (ref 65–99)
Potassium: 4.2 mmol/L (ref 3.5–5.2)
SODIUM: 141 mmol/L (ref 134–144)
Total Protein: 7 g/dL (ref 6.0–8.5)

## 2018-02-19 LAB — LIPID PANEL WITH LDL/HDL RATIO
CHOLESTEROL TOTAL: 162 mg/dL (ref 100–199)
HDL: 87 mg/dL (ref >39)
LDL Calculated: 63 mg/dL (ref 0–99)
LDL/HDL RATIO: 0.7 ratio (ref 0.0–3.2)
TRIGLYCERIDES: 59 mg/dL (ref 0–149)
VLDL Cholesterol Cal: 12 mg/dL (ref 5–40)

## 2018-02-19 LAB — HEMOGLOBIN A1C
ESTIMATED AVERAGE GLUCOSE: 105 mg/dL
Hgb A1c MFr Bld: 5.3 % (ref 4.8–5.6)

## 2018-02-19 LAB — VITAMIN D 25 HYDROXY (VIT D DEFICIENCY, FRACTURES): Vit D, 25-Hydroxy: 19.8 ng/mL — ABNORMAL LOW (ref 30.0–100.0)

## 2018-02-19 LAB — TSH: TSH: 1.46 u[IU]/mL (ref 0.450–4.500)

## 2018-02-19 LAB — T3: T3, Total: 104 ng/dL (ref 71–180)

## 2018-02-19 LAB — T4, FREE: Free T4: 1.15 ng/dL (ref 0.82–1.77)

## 2018-02-19 LAB — INSULIN, RANDOM: INSULIN: 3 u[IU]/mL (ref 2.6–24.9)

## 2018-02-24 NOTE — Progress Notes (Signed)
Office: [phone]  /  Fax: [phone]   Dear Dr. Zola Button,   Thank you for referring [name] to our clinic. The following note includes my evaluation and treatment recommendations.  HPI:   Chief Complaint: OBESITY    [name] has been referred by Donato Schultz, DO for consultation regarding her obesity and obesity related comorbidities.    CARNITA GOLOB (MR# [id number]) is a 62 y.o. female who presents on 02/18/2018 for obesity evaluation and treatment. Current BMI is Body mass index is 35.85 kg/m.[name] [name] has been struggling with her weight for many years and has been unsuccessful in either losing weight, maintaining weight loss, or reaching her healthy weight goal.     [name] attended our information session and states she is currently in the action stage of change and ready to dedicate time achieving and maintaining a healthier weight. [name] is interested in becoming our patient and working on intensive lifestyle modifications including (but not limited to) diet, exercise and weight loss.    [name] states her family eats meals together she thinks her family will eat healthier with  her her desired weight loss is 48 lbs she has been heavy most of  her life she started gaining weight in her 30's her heaviest weight ever was 235 lbs she has significant food cravings issues  she snacks frequently in the evenings she has problems with excessive hunger  she struggles with emotional eating    Fatigue [name] feels her energy is lower than it should be. This has worsened with weight gain and has not worsened recently. [name] admits to daytime somnolence and  admits to waking up still tired. Patient is at risk for obstructive sleep apnea. Patent has a history of symptoms of daytime fatigue. Patient generally gets 6 hours of sleep per night, and states they generally have nightime awakenings. Snoring is present. Apneic episodes are not present. Epworth  Sleepiness Score is 12.  Dyspnea on exertion [name] notes increasing shortness of breath with exercising and seems to be worsening over time with weight gain. She notes getting out of breath sooner with activity than she used to. This has not gotten worse recently. EKG-Inverted T waves in 3 and V2 otherwise within normal limits. [name] denies orthopnea.  Ankylosing Spondylitis [name] occasionally on prednisone for flares, she notes 2-3 flares per year.  Vitamin D Deficiency [name] has a diagnosis of vitamin D deficiency. She was previously on Vit D supplementation and denies nausea, vomiting or muscle weakness.  Hyperglycemia [name] has a history of some elevated blood glucose readings without a diagnosis of diabetes, level of 116. She admits to polyphagia.  Depression Screen [name]'s Food and Mood (modified PHQ-9) score was  Depression screen PHQ 2/9 02/18/2018  Decreased Interest 1  Down, Depressed, Hopeless 0  PHQ - 2 Score 1  Altered sleeping 3  Tired, decreased energy 3  Change in appetite 1  Trouble concentrating 0  Moving slowly or fidgety/restless 0  Suicidal thoughts 0  PHQ-9 Score 8  Difficult doing work/chores Not difficult at all    ALLERGIES: Allergies  Allergen Reactions  . Aspirin     REACTION: intolerance-gi upset  . Codeine   . Morphine   . Sulfonamide Derivatives     MEDICATIONS: Current Outpatient Medications on File Prior to Visit  Medication Sig Dispense Refill  . b complex vitamins capsule Take 1 capsule by mouth daily.    . multivitamin-iron-minerals-folic acid (CENTRUM) chewable tablet Chew 1 tablet by  mouth daily.    . predniSONE (DELTASONE) 5 MG tablet Take 5 mg by mouth daily with breakfast.     No current facility-administered medications on file prior to visit.     PAST MEDICAL HISTORY: Past Medical History:  Diagnosis Date  . Abdominal pain, other specified site 03/31/2008   Centricity Description: FLANK PAIN, RIGHT Qualifier:  Diagnosis of  By: Alwyn Ren MD, Chrissie Noa   Centricity Description: ABDOMINAL PAIN OTHER SPECIFIED SITE Qualifier: Diagnosis of  By: Janit Bern    . Acute sprain or strain of cervical region 04/10/2012  . ALLERGIC RHINITIS 06/26/2007   Qualifier: Diagnosis of  By: Charlsie Quest RMA, Lucy    . ANKYLOSING SPONDYLITIS 03/31/2008   Qualifier: Diagnosis of  By: Alwyn Ren MD, Chrissie Noa    . Ankylosing spondylitis of multiple sites in spine (HCC)   . Arthritis   . Bariatric surgery status 01/14/2008   Qualifier: Diagnosis of  By: Janit Bern    . Blood in stool 12/02/2008   Qualifier: Diagnosis of  By: Janit Bern    . Calf pain 10/13/2012  . CHEST PAIN UNSPECIFIED 10/11/2009   Qualifier: Diagnosis of  By: Arlyce Dice MD, Barbette Hair   . CONSTIPATION 04/06/2010   Qualifier: Diagnosis of  By: Janit Bern    . CORNEAL ABRASION, LEFT 01/22/2010   Qualifier: Diagnosis of  By: Janit Bern    . Gastritis   . GERD 06/26/2007   Qualifier: Diagnosis of  By: Charlsie Quest RMA, Lucy    . GERD (gastroesophageal reflux disease)   . Hiatal hernia   . Hypertension   . Hypoglycemia, unspecified 01/30/2009   Qualifier: Diagnosis of  By: Janit Bern    . IBS 06/26/2007   Qualifier: Diagnosis of  By: Charlsie Quest RMA, Lucy    . LEG CRAMPS, NOCTURNAL 11/14/2010   Qualifier: Diagnosis of  By: Floydene Flock    . Long-term current use of steroids 04/20/2013  . LYMPHADENOPATHY 06/10/2007   Qualifier: Diagnosis of  By: Janit Bern    . MORBID OBESITY 06/26/2007   Qualifier: Diagnosis of  By: Charlsie Quest RMA, Lucy    . MUSCLE PAIN 03/31/2008   Qualifier: Diagnosis of  By: Alwyn Ren MD, Chrissie Noa    . Noninfectious gastroenteritis and colitis 03/17/2013  . Obesity (BMI 30-39.9) 03/16/2014  . OSA (obstructive sleep apnea) 03/08/2016  . 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 Seasonal allergies 05/14/2015  . THYROID CYST 06/26/2007   Qualifier: Diagnosis of  By: Charlsie Quest RMA, Lucy      . Thyroid nodule   . THYROID NODULE, HX OF 06/10/2007   Qualifier: Diagnosis of  By: Janit Bern      PAST SURGICAL HISTORY: Past Surgical History:  Procedure Laterality Date  . BARIATRIC SURGERY    . BREAST REDUCTION SURGERY  1996  . ESOPHAGOGASTRODUODENOSCOPY    . pelvis replacement  2010  . TOTAL HIP ARTHROPLASTY     x 2 left, one revision    SOCIAL HISTORY: Social History   Tobacco Use  . Smoking status: Never Smoker  . Smokeless tobacco: Never Used  Substance Use Topics  . Alcohol use: No    Alcohol/week: 0.0 oz  . Drug use: No    FAMILY HISTORY: Family History  Problem Relation Age of Onset  . Hypertension Mother   . High Cholesterol Mother   . Arthritis Father   . Hypertension  Father   . Diabetes Father   . High Cholesterol Father   . Melanoma Maternal Grandmother   . Breast cancer Maternal Grandmother        and Aunt  . Hypertension Sister   . Diabetes Sister     ROS: Review of Systems  Constitutional: Positive for malaise/fatigue. Negative for weight loss.  Eyes:       + Wear glasses or contacts  Respiratory: Positive for shortness of breath (with exertion).   Cardiovascular: Positive for palpitations. Negative for orthopnea.  Gastrointestinal: Positive for constipation and heartburn. Negative for nausea and vomiting.  Musculoskeletal: Positive for neck pain.       Negative muscle weakness + Muscle or joint pain + Muscle stiffness + Neck stiffness  Endo/Heme/Allergies:       Positive polyphagia    PHYSICAL EXAM: Blood pressure 121/79, pulse 62, temperature 97.9 F (36.6 C), temperature source Oral, height  (1.575 m), weight 196 lb (88.9 kg), SpO2 95 %. Body mass index is 35.85 kg/m. Physical Exam  Constitutional: She is oriented to person, place, and time. She appears well-developed and well-nourished.  HENT:  Head: Normocephalic and atraumatic.  Nose: Nose normal.  Eyes: EOM are normal. No scleral icterus.  Neck: Normal  range of motion. Neck supple. No thyromegaly present.  Cardiovascular: Normal rate and regular rhythm.  Pulmonary/Chest: Effort normal. No respiratory distress.  Abdominal: Soft. There is no tenderness.  + Obesity  Musculoskeletal:  Range of Motion normal in all 4 extremities Trace edema noted in bilateral lower extremities  Neurological: She is alert and oriented to person, place, and time. Coordination normal.  Skin: Skin is warm and dry.  Psychiatric: She has a normal mood and affect. Her behavior is normal.  Vitals reviewed.   RECENT LABS AND TESTS: BMET    Component Value Date/Time   NA 141 02/18/2018 1137   K 4.2 02/18/2018 1137   CL 103 02/18/2018 1137   CO2 23 02/18/2018 1137   GLUCOSE 77 02/18/2018 1137   GLUCOSE 92 11/17/2017 1025   BUN 8 02/18/2018 1137   CREATININE 0.62 02/18/2018 1137   CREATININE 0.55 11/17/2017 1025   CALCIUM 9.4 02/18/2018 1137   CALCIUM 9.1 02/04/2007 2305   GFRNONAA 99 02/18/2018 1137   GFRNONAA 103 11/17/2017 1025   GFRAA 114 02/18/2018 1137   GFRAA 119 11/17/2017 1025   Lab Results  Component Value Date   HGBA1C 5.3 02/18/2018   Lab Results  Component Value Date   INSULIN 3.0 02/18/2018   CBC    Component Value Date/Time   WBC 5.1 02/18/2018 1137   WBC 4.3 11/17/2017 1025   RBC 4.37 02/18/2018 1137   RBC 4.09 11/17/2017 1025   HGB 13.9 02/18/2018 1137   HGB 13.3 11/17/2006 1545   HCT 43.0 02/18/2018 1137   HCT 39.1 11/17/2006 1545   PLT 330 11/17/2017 1025   PLT 256 11/17/2006 1545   MCV 98 (H) 02/18/2018 1137   MCV 95.5 11/17/2006 1545   MCH 31.8 02/18/2018 1137   MCH 32.0 11/17/2017 1025   MCHC 32.3 02/18/2018 1137   MCHC 33.9 11/17/2017 1025   RDW 13.7 02/18/2018 1137   RDW 12.9 11/17/2006 1545   LYMPHSABS 2.6 02/18/2018 1137   LYMPHSABS 1.9 11/17/2006 1545   MONOABS 0.8 06/11/2016 1044   MONOABS 0.5 11/17/2006 1545   EOSABS 0.1 02/18/2018 1137   BASOSABS 0.0 02/18/2018 1137   BASOSABS 0.0 11/17/2006 1545    Iron/TIBC/Ferritin/ %Sat  Component Value Date/Time   IRON 120 06/27/2010 0902   FERRITIN 42.7 06/27/2010 0902   IRONPCTSAT 26.1 06/27/2010 0902   Lipid Panel     Component Value Date/Time   CHOL 162 02/18/2018 1137   TRIG 59 02/18/2018 1137   HDL 87 02/18/2018 1137   CHOLHDL 2 06/11/2016 1044   VLDL 12.0 06/11/2016 1044   LDLCALC 63 02/18/2018 1137   Hepatic Function Panel     Component Value Date/Time   PROT 7.0 02/18/2018 1137   ALBUMIN 4.1 02/18/2018 1137   AST 20 02/18/2018 1137   ALT 22 02/18/2018 1137   ALKPHOS 74 02/18/2018 1137   BILITOT 0.7 02/18/2018 1137   BILIDIR 0.2 05/11/2015 1136      Component Value Date/Time   TSH 1.460 02/18/2018 1137   TSH 1.63 05/11/2015 1136   TSH 0.49 03/16/2014 1128    ECG  shows NSR with a rate of 75 BPM INDIRECT CALORIMETER done today shows a VO2 of 204 and a REE of 1422.  Her calculated basal metabolic rate is 1610 thus her basal metabolic rate is worse than expected.    ASSESSMENT AND PLAN: Other fatigue - Plan: EKG 12-Lead, CBC With Differential, Comprehensive metabolic panel, Lipid Panel With LDL/HDL Ratio, VITAMIN D 25 Hydroxy (Vit-D Deficiency, Fractures), T3, T4, free, TSH  Shortness of breath on exertion  Ankylosing spondylitis, unspecified site of spine (HCC)  Vitamin D deficiency  Hyperglycemia - Plan: Comprehensive metabolic panel, Hemoglobin A1c, Insulin, random, Lipid Panel With LDL/HDL Ratio  Depression screening  Class 2 severe obesity with serious comorbidity and body mass index (BMI) of 36.0 to 36.9 in adult, unspecified obesity type (HCC)  PLAN:  Fatigue Aleli was informed that her fatigue may be related to obesity, depression or many other causes. Labs will be ordered, and in the meanwhile Yudit has agreed to work on diet, exercise and weight loss to help with fatigue. Proper sleep hygiene was discussed including the need for 7-8 hours of quality sleep each night. A sleep study was not  ordered based on symptoms and Epworth score.  Dyspnea on exertion Jhanvi's shortness of breath appears to be obesity related and exercise induced. She has agreed to work on weight loss and gradually increase exercise to treat her exercise induced shortness of breath. If [facility] follows our instructions and loses weight without improvement of her shortness of breath, we will plan to refer to pulmonology. We will monitor this condition regularly. Azaylah agrees to this plan.  Ankylosing Spondylitis Haelee will follow up with Rheumatologist and she agrees to follow up with our clinic in 2 weeks.  Vitamin D Deficiency Tempest was informed that low vitamin D levels contributes to fatigue and are associated with obesity, breast, and colon cancer. She will follow up for routine testing of vitamin D, at least 2-3 times per year. She was informed of the risk of over-replacement of vitamin D and agrees to not increase her dose unless she discusses this with [place] first. We will check labs today and Heavin agrees to follow up with our clinic in 2 weeks.  Hyperglycemia We will check labs today and results with be discussed with [facility] in 2 weeks at her follow up visit. In the meanwhile Shelisa was started on a lower simple carbohydrate diet and will work on weight loss efforts.  Depression Screen Leyana had a mildly positive depression screening. Depression is commonly associated with obesity and often results in emotional eating behaviors. We will monitor this closely and  work on CBT to help improve the non-hunger eating patterns. Referral to Psychology may be required if no improvement is seen as she continues in our clinic.  Obesity Beyonca is currently in the action stage of change and her goal is to continue with weight loss efforts. I recommend Jennell begin the structured treatment plan as follows:  She has agreed to follow the Category 1 plan + 100 calories Shanda has been instructed to  eventually work up to a goal of 150 minutes of combined cardio and strengthening exercise per week for weight loss and overall health benefits. We discussed the following Behavioral Modification Strategies today: increasing lean protein intake, increasing vegetables, work on meal planning and easy cooking plans, better snacking choices, and planning for success   She was informed of the importance of frequent follow up visits to maximize her success with intensive lifestyle modifications for her multiple health conditions. She was informed we would discuss her lab results at her next visit unless there is a critical issue that needs to be addressed sooner. Ziva agreed to keep her next visit at the agreed upon time to discuss these results.    OBESITY BEHAVIORAL INTERVENTION VISIT  Today's visit was # 1 out of 22.  Starting weight: 196 lbs Starting date: 02/18/18 Today's weight : 196 lbs Today's date: 02/18/2018 Total lbs lost to date: 0 (Patients must lose 7 lbs in the first 6 months to continue with counseling)   ASK: We discussed the diagnosis of obesity with [name] today and Abiola agreed to give [place] permission to discuss obesity behavioral modification therapy today.  ASSESS: Artemisa has the diagnosis of obesity and her BMI today is 35.84 Kenidee is in the action stage of change   ADVISE: Destine was educated on the multiple health risks of obesity as well as the benefit of weight loss to improve her health. She was advised of the need for long term treatment and the importance of lifestyle modifications.  AGREE: Multiple dietary modification options and treatment options were discussed and  Makala agreed to the above obesity treatment plan.   I, Burt Knack, am acting as transcriptionist for Debbra Riding, MD  I have reviewed the above documentation for accuracy and completeness, and I agree with the above. - Debbra Riding, MD

## 2018-03-04 ENCOUNTER — Ambulatory Visit (INDEPENDENT_AMBULATORY_CARE_PROVIDER_SITE_OTHER): Payer: Medicare Other | Admitting: Family Medicine

## 2018-03-04 VITALS — BP 128/80 | HR 70 | Temp 97.9°F | Ht 62.0 in | Wt 197.0 lb

## 2018-03-04 DIAGNOSIS — E559 Vitamin D deficiency, unspecified: Secondary | ICD-10-CM | POA: Diagnosis not present

## 2018-03-04 DIAGNOSIS — Z6836 Body mass index (BMI) 36.0-36.9, adult: Secondary | ICD-10-CM

## 2018-03-04 DIAGNOSIS — F3289 Other specified depressive episodes: Secondary | ICD-10-CM | POA: Diagnosis not present

## 2018-03-04 MED ORDER — VITAMIN D (ERGOCALCIFEROL) 1.25 MG (50000 UNIT) PO CAPS: 50000.0000 [IU] | ORAL_CAPSULE | ORAL | 0 refills | Status: DC

## 2018-03-04 NOTE — Progress Notes (Signed)
Office: [phone]  /  Fax: [phone]   HPI:   Chief Complaint: OBESITY [name] is here to discuss her progress with her obesity treatment plan. She is on the Category 1 plan +100 calories and is following her eating plan approximately 75 % of the time. She states she is exercising 0 minutes 0 times per week. [name]. [name] dislikes breakfast. Her weight is 197 lb (89.4 kg) today and has had a weight gain of 1 pounds over a period of 2 weeks since her last visit. She has gained 1 lb since starting treatment with [place].  Vitamin D deficiency [name] has a diagnosis of vitamin D deficiency. She started back on daily multi vitamin. [name] admits fatigue and denies nausea, vomiting or muscle weakness.  Depression with emotional eating behaviors [name] is struggling with over indulging in boredom and emotional eating. She struggles with emotional eating and using food for comfort to the extent that it is negatively impacting her health. She often [name] when she is not hungry. [name] sometimes feels she is out of control and then feels guilty that she made poor food choices. She has been working on behavior modification techniques to help reduce her emotional eating and has been somewhat successful. She shows no sign of suicidal or homicidal ideations.  Depression screen [facility] 2/9 02/18/2018 02/09/2018 06/11/2016 03/05/2016  Decreased Interest 1 0 0 0  Down, Depressed, Hopeless 0 0 0 1  PHQ - 2 Score 1 0 0 1  Altered sleeping 3 - - -  Tired, decreased energy 3 - - -  Change in appetite 1 - - -  Trouble concentrating 0 - - -  Moving slowly or fidgety/restless 0 - - -  Suicidal thoughts 0 - - -  PHQ-9 Score 8 - - -  Difficult doing work/chores Not difficult at all - - -     ALLERGIES: Allergies  Allergen Reactions  . Aspirin     REACTION: intolerance-gi upset  . Codeine   . Morphine   . Sulfonamide Derivatives     MEDICATIONS: Current  Outpatient Medications on File Prior to Visit  Medication Sig Dispense Refill  . b complex vitamins capsule Take 1 capsule by mouth daily.    . Cholecalciferol (VITAMIN D3) 5000 units TABS Take by mouth.    . ferrous sulfate 325 (65 FE) MG tablet Take 325 mg by mouth daily with breakfast.    . multivitamin-iron-minerals-folic acid (CENTRUM) chewable tablet Chew 1 tablet by mouth daily.    . predniSONE (DELTASONE) 5 MG tablet Take 5 mg by mouth daily with breakfast.     No current facility-administered medications on file prior to visit.     PAST MEDICAL HISTORY: Past Medical History:  Diagnosis Date  . Abdominal pain, other specified site 03/31/2008   Centricity Description: FLANK PAIN, RIGHT Qualifier: Diagnosis of  By: Alwyn Ren MD, Chrissie Noa   Centricity Description: ABDOMINAL PAIN OTHER SPECIFIED SITE Qualifier: Diagnosis of  By: Janit Bern    . Acute sprain or strain of cervical region 04/10/2012  . ALLERGIC RHINITIS 06/26/2007   Qualifier: Diagnosis of  By: Charlsie Quest RMA, Lucy    . ANKYLOSING SPONDYLITIS 03/31/2008   Qualifier: Diagnosis of  By: Alwyn Ren MD, Chrissie Noa    . Ankylosing spondylitis of multiple sites in spine (HCC)   . Arthritis   . Bariatric surgery status 01/14/2008   Qualifier: Diagnosis of  By: Janit Bern    . Blood in stool  12/02/2008   Qualifier: Diagnosis of  By: Janit Bern    . Calf pain 10/13/2012  . CHEST PAIN UNSPECIFIED 10/11/2009   Qualifier: Diagnosis of  By: Arlyce Dice MD, Barbette Hair   . CONSTIPATION 04/06/2010   Qualifier: Diagnosis of  By: Janit Bern    . CORNEAL ABRASION, LEFT 01/22/2010   Qualifier: Diagnosis of  By: Janit Bern    . Gastritis   . GERD 06/26/2007   Qualifier: Diagnosis of  By: Charlsie Quest RMA, Lucy    . GERD (gastroesophageal reflux disease)   . Hiatal hernia   . Hypertension   . Hypoglycemia, unspecified 01/30/2009   Qualifier: Diagnosis of  By: Janit Bern    . IBS 06/26/2007   Qualifier: Diagnosis of  By: Charlsie Quest RMA, Lucy      . LEG CRAMPS, NOCTURNAL 11/14/2010   Qualifier: Diagnosis of  By: Floydene Flock    . Long-term current use of steroids 04/20/2013  . LYMPHADENOPATHY 06/10/2007   Qualifier: Diagnosis of  By: Janit Bern    . MORBID OBESITY 06/26/2007   Qualifier: Diagnosis of  By: Charlsie Quest RMA, Lucy    . MUSCLE PAIN 03/31/2008   Qualifier: Diagnosis of  By: Alwyn Ren MD, Chrissie Noa    . Noninfectious gastroenteritis and colitis 03/17/2013  . Obesity (BMI 30-39.9) 03/16/2014  . OSA (obstructive sleep apnea) 03/08/2016  . 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 Seasonal allergies 05/14/2015  . THYROID CYST 06/26/2007   Qualifier: Diagnosis of  By: Charlsie Quest RMA, Lucy    . Thyroid nodule   . THYROID NODULE, HX OF 06/10/2007   Qualifier: Diagnosis of  By: Janit Bern      PAST SURGICAL HISTORY: Past Surgical History:  Procedure Laterality Date  . BARIATRIC SURGERY    . BREAST REDUCTION SURGERY  1996  . ESOPHAGOGASTRODUODENOSCOPY    . pelvis replacement  2010  . TOTAL HIP ARTHROPLASTY     x 2 left, one revision    SOCIAL HISTORY: Social History   Tobacco Use  . Smoking status: Never Smoker  . Smokeless tobacco: Never Used  Substance Use Topics  . Alcohol use: No    Alcohol/week: 0.0 oz  . Drug use: No    FAMILY HISTORY: Family History  Problem Relation Age of Onset  . Hypertension Mother   . High Cholesterol Mother   . Arthritis Father   . Hypertension Father   . Diabetes Father   . High Cholesterol Father   . Melanoma Maternal Grandmother   . Breast cancer Maternal Grandmother        and Aunt  . Hypertension Sister   . Diabetes Sister     ROS: Review of Systems  Constitutional: Positive for malaise/fatigue. Negative for weight loss.  Gastrointestinal: Negative for nausea and vomiting.  Musculoskeletal:       Negative for muscle weakness  Psychiatric/Behavioral: Positive for depression. Negative for suicidal ideas.     PHYSICAL EXAM: Blood pressure 128/80, pulse 70, temperature 97.9 F (36.6 C), temperature source Oral, height 5\' 2"  (1.575 m), weight 197 lb (89.4 kg), SpO2 99 %. Body mass index is 36.03 kg/m. Physical Exam  Constitutional: She is oriented to person, place, and time. She appears well-developed and well-nourished.  Cardiovascular: Normal rate.  Pulmonary/Chest: Effort normal.  Musculoskeletal: Normal range of motion.  Neurological: She is oriented to person, place, and time.  Skin: Skin  is warm and dry.  Psychiatric: She has a normal mood and affect. Her behavior is normal.  Vitals reviewed.   RECENT LABS AND TESTS: BMET    Component Value Date/Time   NA 141 02/18/2018 1137   K 4.2 02/18/2018 1137   CL 103 02/18/2018 1137   CO2 23 02/18/2018 1137   GLUCOSE 77 02/18/2018 1137   GLUCOSE 92 11/17/2017 1025   BUN 8 02/18/2018 1137   CREATININE 0.62 02/18/2018 1137   CREATININE 0.55 11/17/2017 1025   CALCIUM 9.4 02/18/2018 1137   CALCIUM 9.1 02/04/2007 2305   GFRNONAA 99 02/18/2018 1137   GFRNONAA 103 11/17/2017 1025   GFRAA 114 02/18/2018 1137   GFRAA 119 11/17/2017 1025   Lab Results  Component Value Date   HGBA1C 5.3 02/18/2018   HGBA1C 5.5 06/11/2016   HGBA1C 5.5 04/20/2013   Lab Results  Component Value Date   INSULIN 3.0 02/18/2018   CBC    Component Value Date/Time   WBC 5.1 02/18/2018 1137   WBC 4.3 11/17/2017 1025   RBC 4.37 02/18/2018 1137   RBC 4.09 11/17/2017 1025   HGB 13.9 02/18/2018 1137   HGB 13.3 11/17/2006 1545   HCT 43.0 02/18/2018 1137   HCT 39.1 11/17/2006 1545   PLT 330 11/17/2017 1025   PLT 256 11/17/2006 1545   MCV 98 (H) 02/18/2018 1137   MCV 95.5 11/17/2006 1545   MCH 31.8 02/18/2018 1137   MCH 32.0 11/17/2017 1025   MCHC 32.3 02/18/2018 1137   MCHC 33.9 11/17/2017 1025   RDW 13.7 02/18/2018 1137   RDW 12.9 11/17/2006 1545   LYMPHSABS 2.6 02/18/2018 1137   LYMPHSABS 1.9 11/17/2006 1545   MONOABS 0.8 06/11/2016 1044    MONOABS 0.5 11/17/2006 1545   EOSABS 0.1 02/18/2018 1137   BASOSABS 0.0 02/18/2018 1137   BASOSABS 0.0 11/17/2006 1545   Iron/TIBC/Ferritin/ %Sat    Component Value Date/Time   IRON 120 06/27/2010 0902   FERRITIN 42.7 06/27/2010 0902   IRONPCTSAT 26.1 06/27/2010 0902   Lipid Panel     Component Value Date/Time   CHOL 162 02/18/2018 1137   TRIG 59 02/18/2018 1137   HDL 87 02/18/2018 1137   CHOLHDL 2 06/11/2016 1044   VLDL 12.0 06/11/2016 1044   LDLCALC 63 02/18/2018 1137   Hepatic Function Panel     Component Value Date/Time   PROT 7.0 02/18/2018 1137   ALBUMIN 4.1 02/18/2018 1137   AST 20 02/18/2018 1137   ALT 22 02/18/2018 1137   ALKPHOS 74 02/18/2018 1137   BILITOT 0.7 02/18/2018 1137   BILIDIR 0.2 05/11/2015 1136      Component Value Date/Time   TSH 1.460 02/18/2018 1137   TSH 1.63 05/11/2015 1136   TSH 0.49 03/16/2014 1128   Results for JILLIENNE, EGNER (MRN [id number]) as of 03/04/2018 14:17  Ref. Range 02/18/2018 11:37  Vitamin D, 25-Hydroxy Latest Ref Range: 30.0 - 100.0 ng/mL 19.8 (L)   ASSESSMENT AND PLAN: Vitamin D deficiency - Plan: Vitamin D, Ergocalciferol, (DRISDOL) 50000 units CAPS capsule  Other depression - with emotional eating  Class 2 severe obesity with serious comorbidity and body mass index (BMI) of 36.0 to 36.9 in adult, unspecified obesity type (HCC)  PLAN:  Vitamin D Deficiency Arlita was informed that low vitamin D levels contributes to fatigue and are associated with obesity, breast, and colon cancer. She agrees to start prescription Vit D @50 ,000 IU every week #4 with no refills and will follow up for  routine testing of vitamin D, at least 2-3 times per year. She was informed of the risk of over-replacement of vitamin D and agrees to not increase her dose unless she discusses this with us first. [name] agrees to follow up as directed.  Depression with Emotional Eating Behaviors We discussed behavior modification techniques today to  help [name] deal with her emotional eating and depression. We will refer patient to Dr. Dewaine CongerBarker and she will follow up with our clinic as directed.  Obesity [name] is currently in the action stage of change. As such, her goal is to continue with weight loss efforts She has agreed to keep a food journal with 200 to 250 calories and 20+ grams of protein at breakfast daily and follow the Category 1 plan +1 cup of vegetables and 100 calorie snack [name] has been instructed to work up to a goal of 150 minutes of combined cardio and strengthening exercise per week for weight loss and overall health benefits. We discussed the following Behavioral Modification Strategies today: planning for success, increasing lean protein intake, increasing vegetables and work on meal planning and easy cooking plans  [name] has agreed to follow up with our clinic in 2 weeks. She was informed of the importance of frequent follow up visits to maximize her success with intensive lifestyle modifications for her multiple health conditions.   OBESITY BEHAVIORAL INTERVENTION VISIT  Today's visit was # 2 out of 22.  Starting weight: 196 lbs Starting date: 02/18/18 Today's weight : 197 lbs Today's date: 03/04/2018 Total lbs lost to date: 0 (Patients must lose 7 lbs in the first 6 months to continue with counseling)   ASK: We discussed the diagnosis of obesity with [name] today and [name] agreed to give us permission to discuss obesity behavioral modification therapy today.  ASSESS: [name] has the diagnosis of obesity and her BMI today is 36.02 [name] is in the action stage of change   ADVISE: [name] was educated on the multiple health risks of obesity as well as the benefit of weight loss to improve her health. She was advised of the need for long term treatment and the importance of lifestyle modifications.  AGREE: Multiple dietary modification options and treatment options were discussed and   [name] agreed to the above obesity treatment plan.  I, [place] CraneJoanne Murray, am acting as transcriptionist for Filbert SchilderAlexandria U. Kadolph, MD  I have reviewed the above documentation for accuracy and completeness, and I agree with the above. - Debbra RidingAlexandria Kadolph, MD

## 2018-03-19 ENCOUNTER — Ambulatory Visit (INDEPENDENT_AMBULATORY_CARE_PROVIDER_SITE_OTHER): Payer: Medicare Other | Admitting: Psychology

## 2018-03-19 ENCOUNTER — Encounter (INDEPENDENT_AMBULATORY_CARE_PROVIDER_SITE_OTHER): Payer: Self-pay

## 2018-03-24 ENCOUNTER — Ambulatory Visit (INDEPENDENT_AMBULATORY_CARE_PROVIDER_SITE_OTHER): Payer: Medicare Other | Admitting: Family Medicine

## 2018-03-24 ENCOUNTER — Encounter (INDEPENDENT_AMBULATORY_CARE_PROVIDER_SITE_OTHER): Payer: Self-pay

## 2018-03-31 DIAGNOSIS — M25522 Pain in left elbow: Secondary | ICD-10-CM | POA: Diagnosis not present

## 2018-04-07 ENCOUNTER — Ambulatory Visit (INDEPENDENT_AMBULATORY_CARE_PROVIDER_SITE_OTHER): Payer: Self-pay | Admitting: Physician Assistant

## 2018-04-07 ENCOUNTER — Encounter (INDEPENDENT_AMBULATORY_CARE_PROVIDER_SITE_OTHER): Payer: Self-pay

## 2018-06-02 ENCOUNTER — Encounter: Payer: Self-pay | Admitting: Family Medicine

## 2018-06-02 ENCOUNTER — Ambulatory Visit (INDEPENDENT_AMBULATORY_CARE_PROVIDER_SITE_OTHER): Payer: Medicare Other | Admitting: Family Medicine

## 2018-06-02 DIAGNOSIS — I10 Essential (primary) hypertension: Secondary | ICD-10-CM | POA: Diagnosis not present

## 2018-06-02 DIAGNOSIS — L509 Urticaria, unspecified: Secondary | ICD-10-CM

## 2018-06-02 MED ORDER — CETIRIZINE HCL 10 MG PO TABS: 10.0000 mg | ORAL_TABLET | Freq: Two times a day (BID) | ORAL | 11 refills | Status: DC | PRN

## 2018-06-02 MED ORDER — RANITIDINE HCL 150 MG PO TABS: 150.0000 mg | ORAL_TABLET | Freq: Two times a day (BID) | ORAL | 1 refills | Status: DC | PRN

## 2018-06-02 NOTE — Patient Instructions (Addendum)
[employer] makes a multistrain probiotic, downstairs or online at [employer] or [employer] diet for weight loss  [employer] Astringent for bug bites, itching, hemorrhoids Hives Hives (urticaria) are itchy, red, swollen areas on your skin. Hives can appear on any part of your body and can vary in size. They can be as small as the tip of a pen or much larger. Hives often fade within 24 hours (acute hives). In other cases, new hives appear after old ones fade. This cycle can continue for several days or weeks (chronic hives). Hives result from your body's reaction to an irritant or to something that you are allergic to (trigger). When you are exposed to a trigger, your body releases a chemical (histamine) that causes redness, itching, and swelling. You can get hives immediately after being exposed to a trigger or hours later. Hives do not spread from person to person (are not contagious). Your hives may get worse with scratching, exercise, and emotional stress. What are the causes? Causes of this condition include:  Allergies to certain foods or ingredients.  Insect bites or stings.  Exposure to pollen or pet dander.  Contact with latex or chemicals.  Spending time in sunlight, heat, or cold (exposure).  Exercise.  Stress.  You can also get hives from some medical conditions and treatments. These include:  Viruses, including the common cold.  Bacterial infections, such as urinary tract infections and strep throat.  Disorders such as vasculitis, lupus, or thyroid disease.  Certain medications.  Allergy shots.  Blood transfusions.  Sometimes, the cause of hives is not known (idiopathic hives). What increases the risk? This condition is more likely to develop in:  Women.  People who have food allergies, especially to citrus fruits, milk, eggs, peanuts, tree nuts, or shellfish.  People who are allergic  to: ? Medicines. ? Latex. ? Insects. ? Animals. ? Pollen.  People who have certain medical conditions, includinglupus or thyroid disease.  What are the signs or symptoms? The main symptom of this condition is raised, itchyred or white bumps or patches on your skin. These areas may:  Become large and swollen (welts).  Change in shape and location, quickly and repeatedly.  Be separate hives or connect over a large area of skin.  Sting or become painful.  Turn white when pressed in the center (blanch).  In severe cases, yourhands, feet, and face may also become swollen. This may occur if hives develop deeper in your skin. How is this diagnosed? This condition is diagnosed based on your symptoms, medical history, and physical exam. Your skin, urine, or blood may be tested to find out what is causing your hives and to rule out other health issues. Your health care provider may also remove a small sample of skin from the affected area and examine it under a microscope (biopsy). How is this treated? Treatment depends on the severity of your condition. Your health care provider may recommend using cool, wet cloths (cool compresses) or taking cool showers to relieve itching. Hives are sometimes treated with medicines, including:  Antihistamines.  Corticosteroids.  Antibiotics.  An injectable medicine (omalizumab). Your health care provider may prescribe this if you have chronic idiopathic hives and you continue to have symptoms even after treatment with antihistamines.  Severe cases may require an emergency injection of adrenaline (epinephrine) to prevent a life-threatening allergic reaction (anaphylaxis). Follow these instructions at home: Medicines  Take or apply over-the-counter and prescription medicines only as told by your health care provider.  If you were prescribed an antibiotic medicine, use it as told by your health care provider. Do not stop taking the antibiotic even  if you start to feel better. Skin Care  Apply cool compresses to the affected areas.  Do not scratch or rub your skin. General instructions  Do not take hot showers or baths. This can make itching worse.  Do not wear tight-fitting clothing.  Use sunscreen and wear protective clothing when you are outside.  Avoid any substances that cause your hives. Keep a journal to help you track what causes your hives. Write down: ? What medicines you take. ? What you eat and drink. ? What products you use on your skin.  Keep all follow-up visits as told by your health care provider. This is important. Contact a health care provider if:  Your symptoms are not controlled with medicine.  Your joints are painful or swollen. Get help right away if:  You have a fever.  You have pain in your abdomen.  Your tongue or lips are swollen.  Your eyelids are swollen.  Your chest or throat feels tight.  You have trouble breathing or swallowing. These symptoms may represent a serious problem that is an emergency. Do not wait to see if the symptoms will go away. Get medical help right away. Call your local emergency services (911 in the [place] Do not drive yourself to the hospital. This information is not intended to replace advice given to you by your health care provider. Make sure you discuss any questions you have with your health care provider. Document Released: 09/16/2005 Document Revised: 02/14/2016 Document Reviewed: 07/05/2015 Elsevier Interactive Patient Education  2018 [employer].

## 2018-06-07 DIAGNOSIS — L509 Urticaria, unspecified: Secondary | ICD-10-CM | POA: Insufficient documentation

## 2018-06-07 NOTE — Progress Notes (Signed)
Subjective:    Patient ID: [name], female    DOB: 26-May-1956, 62 y.o.   MRN: [id number]  Chief Complaint  Patient presents with  . Rash    left arm, face, redness, itching, been taking keflex and mupirocin for vaginal infection    HPI Patient is in today for evaluation of a rash. She notes a pruritic rash that is raised and red on arms and elsewhere. The lesions change but do ot esolve and have been present off and on this week. No new shampoos, products, clothing etc. She did start a new med but it was many weeks ago and no other trouble noted. She does acknowledge being under stress lately. Denies CP/palp/SOB/HA/congestion/fevers/GI or GU c/o. Taking meds as prescribed  Past Medical History:  Diagnosis Date  . Abdominal pain, other specified site 03/31/2008   Centricity Description: FLANK PAIN, RIGHT Qualifier: Diagnosis of  By: Alwyn Ren MD, Chrissie Noa   Centricity Description: ABDOMINAL PAIN OTHER SPECIFIED SITE Qualifier: Diagnosis of  By: Janit Bern    . Acute sprain or strain of cervical region 04/10/2012  . ALLERGIC RHINITIS 06/26/2007   Qualifier: Diagnosis of  By: Charlsie Quest RMA, Lucy    . ANKYLOSING SPONDYLITIS 03/31/2008   Qualifier: Diagnosis of  By: Alwyn Ren MD, Chrissie Noa    . Ankylosing spondylitis of multiple sites in spine (HCC)   . Arthritis   . Bariatric surgery status 01/14/2008   Qualifier: Diagnosis of  By: Janit Bern    . Blood in stool 12/02/2008   Qualifier: Diagnosis of  By: Janit Bern    . Calf pain 10/13/2012  . CHEST PAIN UNSPECIFIED 10/11/2009   Qualifier: Diagnosis of  By: Arlyce Dice MD, Barbette Hair   . CONSTIPATION 04/06/2010   Qualifier: Diagnosis of  By: Janit Bern    . CORNEAL ABRASION, LEFT 01/22/2010   Qualifier: Diagnosis of  By: Janit Bern    . Gastritis   . GERD 06/26/2007   Qualifier: Diagnosis of  By: Charlsie Quest RMA, Lucy    . GERD (gastroesophageal reflux disease)   . Hiatal hernia   . Hypertension   . Hypoglycemia, unspecified  01/30/2009   Qualifier: Diagnosis of  By: Janit Bern    . IBS 06/26/2007   Qualifier: Diagnosis of  By: Charlsie Quest RMA, Lucy    . LEG CRAMPS, NOCTURNAL 11/14/2010   Qualifier: Diagnosis of  By: Floydene Flock    . Long-term current use of steroids 04/20/2013  . LYMPHADENOPATHY 06/10/2007   Qualifier: Diagnosis of  By: Janit Bern    . MORBID OBESITY 06/26/2007   Qualifier: Diagnosis of  By: Charlsie Quest RMA, Lucy    . MUSCLE PAIN 03/31/2008   Qualifier: Diagnosis of  By: Alwyn Ren MD, Chrissie Noa    . Noninfectious gastroenteritis and colitis 03/17/2013  . Obesity (BMI 30-39.9) 03/16/2014  . OSA (obstructive sleep apnea) 03/08/2016  . 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 Seasonal allergies 05/14/2015  . THYROID CYST 06/26/2007   Qualifier: Diagnosis of  By: Charlsie Quest RMA, Lucy    . Thyroid nodule   . THYROID NODULE, HX OF 06/10/2007   Qualifier: Diagnosis of  By: Janit Bern      Past Surgical History:  Procedure Laterality Date  . BARIATRIC SURGERY    . BREAST REDUCTION SURGERY  1996  . ESOPHAGOGASTRODUODENOSCOPY    . pelvis replacement  2010  .  TOTAL HIP ARTHROPLASTY     x 2 left, one revision    Family History  Problem Relation Age of Onset  . Hypertension Mother   . High Cholesterol Mother   . Arthritis Father   . Hypertension Father   . Diabetes Father   . High Cholesterol Father   . Melanoma Maternal Grandmother   . Breast cancer Maternal Grandmother        and Aunt  . Hypertension Sister   . Diabetes Sister     Social History   Socioeconomic History  . Marital status: Married    Spouse name: Kellsey Sansone  . Number of children: Not on file  . Years of education: Not on file  . Highest education level: Not on file  Occupational History  . Occupation: retired  Engineer, production  . Financial resource strain: Not on file  . Food insecurity:    Worry: Not on file    Inability: Not on file  . Transportation  needs:    Medical: Not on file    Non-medical: Not on file  Tobacco Use  . Smoking status: Never Smoker  . Smokeless tobacco: Never Used  Substance and Sexual Activity  . Alcohol use: No    Alcohol/week: 0.0 standard drinks  . Drug use: No  . Sexual activity: Yes  Lifestyle  . Physical activity:    Days per week: Not on file    Minutes per session: Not on file  . Stress: Not on file  Relationships  . Social connections:    Talks on phone: Not on file    Gets together: Not on file    Attends religious service: Not on file    Active member of club or organization: Not on file    Attends meetings of clubs or organizations: Not on file    Relationship status: Not on file  . Intimate partner violence:    Fear of current or ex partner: Not on file    Emotionally abused: Not on file    Physically abused: Not on file    Forced sexual activity: Not on file  Other Topics Concern  . Not on file  Social History Narrative   Occupation: Sports coach   Daily Caffeine use- 1 cup daily       Outpatient Medications Prior to Visit  Medication Sig Dispense Refill  . b complex vitamins capsule Take 1 capsule by mouth daily.    . cephALEXin (KEFLEX) 500 MG capsule TAKE 1 CAPSULE BY MOUTH FOUR TIMES A DAY UNTIL FINISHED  0  . Cholecalciferol (VITAMIN D3) 5000 units TABS Take by mouth.    . ferrous sulfate 325 (65 FE) MG tablet Take 325 mg by mouth daily with breakfast.    . multivitamin-iron-minerals-folic acid (CENTRUM) chewable tablet Chew 1 tablet by mouth daily.    . predniSONE (DELTASONE) 5 MG tablet Take 5 mg by mouth daily with breakfast.    . Vitamin D, Ergocalciferol, (DRISDOL) 50000 units CAPS capsule Take 1 capsule (50,000 Units total) by mouth every 7 (seven) days. 4 capsule 0   No facility-administered medications prior to visit.     Allergies  Allergen Reactions  . Aspirin     REACTION: intolerance-gi upset  . Codeine   . Morphine   . Sulfonamide Derivatives      Review of Systems  Constitutional: Negative for fever and malaise/fatigue.  HENT: Negative for congestion.   Eyes: Negative for blurred vision.  Respiratory: Negative for shortness of  breath.   Cardiovascular: Negative for chest pain, palpitations and leg swelling.  Gastrointestinal: Negative for abdominal pain, blood in stool and nausea.  Genitourinary: Negative for dysuria and frequency.  Musculoskeletal: Negative for falls.  Skin: Positive for itching and rash.  Neurological: Negative for dizziness, loss of consciousness and headaches.  Endo/Heme/Allergies: Negative for environmental allergies.  Psychiatric/Behavioral: Negative for depression. The patient is not nervous/anxious.        Objective:    Physical Exam  Constitutional: She is oriented to person, place, and time. She appears well-developed and well-nourished. No distress.  HENT:  Head: Normocephalic and atraumatic.  Nose: Nose normal.  Eyes: Right eye exhibits no discharge. Left eye exhibits no discharge.  Neck: Normal range of motion. Neck supple.  Cardiovascular: Normal rate and regular rhythm.  No murmur heard. Pulmonary/Chest: Effort normal and breath sounds normal.  Abdominal: Soft. Bowel sounds are normal. There is no tenderness.  Musculoskeletal: She exhibits no edema.  Neurological: She is alert and oriented to person, place, and time.  Skin: Skin is warm and dry. Rash noted. There is erythema.  Raised urticarial lesions on arms. Irregularly shaped and erythematous. 1 patch on neck  Psychiatric: She has a normal mood and affect.  Nursing note and vitals reviewed.   BP 136/82 (BP Location: Right Arm, Patient Position: Sitting, Cuff Size: Large)   Pulse 74   Temp 97.7 F (36.5 C) (Oral)   Resp 16   Ht 5\' 2"  (1.575 m)   Wt 198 lb (89.8 kg)   SpO2 98%   BMI 36.21 kg/m  Wt Readings from Last 3 Encounters:  06/02/18 198 lb (89.8 kg)  03/04/18 197 lb (89.4 kg)  02/18/18 196 lb (88.9 kg)      Lab Results  Component Value Date   WBC 5.1 02/18/2018   HGB 13.9 02/18/2018   HCT 43.0 02/18/2018   PLT 330 11/17/2017   GLUCOSE 77 02/18/2018   CHOL 162 02/18/2018   TRIG 59 02/18/2018   HDL 87 02/18/2018   LDLCALC 63 02/18/2018   ALT 22 02/18/2018   AST 20 02/18/2018   NA 141 02/18/2018   K 4.2 02/18/2018   CL 103 02/18/2018   CREATININE 0.62 02/18/2018   BUN 8 02/18/2018   CO2 23 02/18/2018   TSH 1.460 02/18/2018   INR 1.2 09/26/2008   HGBA1C 5.3 02/18/2018    Lab Results  Component Value Date   TSH 1.460 02/18/2018   Lab Results  Component Value Date   WBC 5.1 02/18/2018   HGB 13.9 02/18/2018   HCT 43.0 02/18/2018   MCV 98 (H) 02/18/2018   PLT 330 11/17/2017   Lab Results  Component Value Date   NA 141 02/18/2018   K 4.2 02/18/2018   CO2 23 02/18/2018   GLUCOSE 77 02/18/2018   BUN 8 02/18/2018   CREATININE 0.62 02/18/2018   BILITOT 0.7 02/18/2018   ALKPHOS 74 02/18/2018   AST 20 02/18/2018   ALT 22 02/18/2018   PROT 7.0 02/18/2018   ALBUMIN 4.1 02/18/2018   CALCIUM 9.4 02/18/2018   GFR 103.75 06/24/2016   Lab Results  Component Value Date   CHOL 162 02/18/2018   Lab Results  Component Value Date   HDL 87 02/18/2018   Lab Results  Component Value Date   LDLCALC 63 02/18/2018   Lab Results  Component Value Date   TRIG 59 02/18/2018   Lab Results  Component Value Date   CHOLHDL 2 06/11/2016   Lab  Results  Component Value Date   HGBA1C 5.3 02/18/2018       Assessment & Plan:   Problem List Items Addressed This Visit    Essential hypertension    Well controlled, no changes to meds. Encouraged heart healthy diet such as the DASH diet and exercise as tolerated.       Hives    Encouraged Zyrtec and Zantac bid, no harsh soaps or topical products. She had been stated on a new medicaltion several weeks prior to the rash if rash worsens or does not resolve she may need to discuss changing this medication.          I am  having [name] start on cetirizine and ranitidine. I am also having her maintain her multivitamin-iron-minerals-folic acid, predniSONE, b complex vitamins, Vitamin D3, ferrous sulfate, Vitamin D (Ergocalciferol), and cephALEXin.  Meds ordered this encounter  Medications  . cetirizine (ZYRTEC) 10 MG tablet    Sig: Take 1 tablet (10 mg total) by mouth 2 (two) times daily as needed for allergies (rash).    Dispense:  30 tablet    Refill:  11  . ranitidine (ZANTAC) 150 MG tablet    Sig: Take 1 tablet (150 mg total) by mouth 2 (two) times daily as needed for heartburn (rash).    Dispense:  60 tablet    Refill:  1     Danise Edge, MD

## 2018-06-07 NOTE — Assessment & Plan Note (Signed)
Well controlled, no changes to meds. Encouraged heart healthy diet such as the DASH diet and exercise as tolerated.  °

## 2018-06-07 NOTE — Assessment & Plan Note (Signed)
Encouraged Zyrtec and Zantac bid, no harsh soaps or topical products. She had been stated on a new medicaltion several weeks prior to the rash if rash worsens or does not resolve she may need to discuss changing this medication.

## 2018-06-11 DIAGNOSIS — M545 Low back pain: Secondary | ICD-10-CM | POA: Diagnosis not present

## 2018-06-17 DIAGNOSIS — M545 Low back pain: Secondary | ICD-10-CM | POA: Diagnosis not present

## 2018-06-17 DIAGNOSIS — M546 Pain in thoracic spine: Secondary | ICD-10-CM | POA: Diagnosis not present

## 2018-06-17 DIAGNOSIS — M6281 Muscle weakness (generalized): Secondary | ICD-10-CM | POA: Diagnosis not present

## 2018-06-17 DIAGNOSIS — R296 Repeated falls: Secondary | ICD-10-CM | POA: Diagnosis not present

## 2018-06-17 DIAGNOSIS — M5416 Radiculopathy, lumbar region: Secondary | ICD-10-CM | POA: Diagnosis not present

## 2018-06-19 DIAGNOSIS — R296 Repeated falls: Secondary | ICD-10-CM | POA: Diagnosis not present

## 2018-06-19 DIAGNOSIS — M5416 Radiculopathy, lumbar region: Secondary | ICD-10-CM | POA: Diagnosis not present

## 2018-06-19 DIAGNOSIS — M546 Pain in thoracic spine: Secondary | ICD-10-CM | POA: Diagnosis not present

## 2018-06-19 DIAGNOSIS — M6281 Muscle weakness (generalized): Secondary | ICD-10-CM | POA: Diagnosis not present

## 2018-06-19 DIAGNOSIS — M545 Low back pain: Secondary | ICD-10-CM | POA: Diagnosis not present

## 2018-06-22 ENCOUNTER — Ambulatory Visit (INDEPENDENT_AMBULATORY_CARE_PROVIDER_SITE_OTHER): Payer: Medicare Other | Admitting: Family Medicine

## 2018-06-22 VITALS — BP 135/85 | HR 68 | Temp 98.2°F | Ht 62.0 in | Wt 197.0 lb

## 2018-06-22 DIAGNOSIS — E559 Vitamin D deficiency, unspecified: Secondary | ICD-10-CM | POA: Diagnosis not present

## 2018-06-22 DIAGNOSIS — R739 Hyperglycemia, unspecified: Secondary | ICD-10-CM

## 2018-06-22 DIAGNOSIS — Z6835 Body mass index (BMI) 35.0-35.9, adult: Secondary | ICD-10-CM | POA: Diagnosis not present

## 2018-06-22 MED ORDER — VITAMIN D (ERGOCALCIFEROL) 1.25 MG (50000 UNIT) PO CAPS: 50000.0000 [IU] | ORAL_CAPSULE | ORAL | 0 refills | Status: DC

## 2018-06-23 NOTE — Progress Notes (Signed)
Office: [phone]  /  Fax: [phone]   HPI:   Chief Complaint: OBESITY [name] is here to discuss her progress with her obesity treatment plan. She is on the  follow the Category 2 plan +1 cup of vegetables and 100 calories and is following her eating plan approximately 75 % of the time. She states she is exercising 0 minutes 0 times per week. [name] has elderly parents so has had to change her last 2 appointments to care for her parents. She has gotten off track. She is trying to track what she is eating.  Her weight is 197 lb (89.4 kg) today and has had a weight loss of 1 pound over a period of 16 weeks since her last visit. She has lost 1 lb since starting treatment with [place].  Vitamin D deficiency [name] has a diagnosis of vitamin D deficiency. She is currently taking vit D and denies nausea, vomiting or muscle weakness. She admits to fatigue.    Ref. Range 11/17/2017 10:25  Vitamin D, 25-Hydroxy Latest Ref Range: 30 - 100 ng/mL 32   Hyperglycemia [name] has a history of some elevated blood glucose readings without a diagnosis of diabetes. She hasn't been checking blood glucose, but she struggles to limit carbohydrates. She has stuck to approximately 1200 calories has not met protein goals.  She denies polyphagia.   ALLERGIES: Allergies  Allergen Reactions  . Aspirin     REACTION: intolerance-gi upset  . Codeine   . Morphine   . Sulfonamide Derivatives     MEDICATIONS: Current Outpatient Medications on File Prior to Visit  Medication Sig Dispense Refill  . b complex vitamins capsule Take 1 capsule by mouth daily.    . cephALEXin (KEFLEX) 500 MG capsule TAKE 1 CAPSULE BY MOUTH FOUR TIMES A DAY UNTIL FINISHED  0  . cetirizine (ZYRTEC) 10 MG tablet Take 1 tablet (10 mg total) by mouth 2 (two) times daily as needed for allergies (rash). 30 tablet 11  . Cholecalciferol (VITAMIN D3) 5000 units TABS Take by mouth.    . ferrous sulfate 325 (65 FE) MG tablet Take 325 mg by  mouth daily with breakfast.    . multivitamin-iron-minerals-folic acid (CENTRUM) chewable tablet Chew 1 tablet by mouth daily.    . predniSONE (DELTASONE) 5 MG tablet Take 5 mg by mouth daily with breakfast.    . ranitidine (ZANTAC) 150 MG tablet Take 1 tablet (150 mg total) by mouth 2 (two) times daily as needed for heartburn (rash). 60 tablet 1   No current facility-administered medications on file prior to visit.     PAST MEDICAL HISTORY: Past Medical History:  Diagnosis Date  . Abdominal pain, other specified site 03/31/2008   Centricity Description: FLANK PAIN, RIGHT Qualifier: Diagnosis of  By: Linna Darner MD, Gwyndolyn Saxon   Centricity Description: ABDOMINAL PAIN OTHER SPECIFIED SITE Qualifier: Diagnosis of  By: Jerold Coombe    . Acute sprain or strain of cervical region 04/10/2012  . ALLERGIC RHINITIS 06/26/2007   Qualifier: Diagnosis of  By: Marca Ancona RMA, Lucy    . ANKYLOSING SPONDYLITIS 03/31/2008   Qualifier: Diagnosis of  By: Linna Darner MD, Gwyndolyn Saxon    . Ankylosing spondylitis of multiple sites in spine ([place])   . Arthritis   . Bariatric surgery status 01/14/2008   Qualifier: Diagnosis of  By: Jerold Coombe    . Blood in stool 12/02/2008   Qualifier: Diagnosis of  By: Jerold Coombe    . Calf pain 10/13/2012  .  CHEST PAIN UNSPECIFIED 10/11/2009   Qualifier: Diagnosis of  By: Deatra Ina MD, Sandy Salaam   . CONSTIPATION 04/06/2010   Qualifier: Diagnosis of  By: Jerold Coombe    . CORNEAL ABRASION, LEFT 01/22/2010   Qualifier: Diagnosis of  By: Jerold Coombe    . Gastritis   . GERD 06/26/2007   Qualifier: Diagnosis of  By: Marca Ancona RMA, Lucy    . GERD (gastroesophageal reflux disease)   . Hiatal hernia   . Hypertension   . Hypoglycemia, unspecified 01/30/2009   Qualifier: Diagnosis of  By: Jerold Coombe    . IBS 06/26/2007   Qualifier: Diagnosis of  By: Marca Ancona RMA, Lucy    . LEG CRAMPS, NOCTURNAL 11/14/2010   Qualifier: Diagnosis of  By: Heath Lark    . Long-term current use of steroids  04/20/2013  . LYMPHADENOPATHY 06/10/2007   Qualifier: Diagnosis of  By: Jerold Coombe    . MORBID OBESITY 06/26/2007   Qualifier: Diagnosis of  By: Marca Ancona RMA, Lucy    . MUSCLE PAIN 03/31/2008   Qualifier: Diagnosis of  By: Linna Darner MD, Gwyndolyn Saxon    . Noninfectious gastroenteritis and colitis 03/17/2013  . Obesity (BMI 30-39.9) 03/16/2014  . OSA (obstructive sleep apnea) 03/08/2016  . 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/23/2010   Qualifier: Diagnosis of  By: Jerold Coombe    . Seasonal allergies 05/14/2015  . THYROID CYST 06/26/2007   Qualifier: Diagnosis of  By: Marca Ancona RMA, Lucy    . Thyroid nodule   . THYROID NODULE, HX OF 06/10/2007   Qualifier: Diagnosis of  By: Jerold Coombe      PAST SURGICAL HISTORY: Past Surgical History:  Procedure Laterality Date  . BARIATRIC SURGERY    . BREAST REDUCTION SURGERY  1996  . ESOPHAGOGASTRODUODENOSCOPY    . pelvis replacement  2010  . TOTAL HIP ARTHROPLASTY     x 2 left, one revision    SOCIAL HISTORY: Social History   Tobacco Use  . Smoking status: Never Smoker  . Smokeless tobacco: Never Used  Substance Use Topics  . Alcohol use: No    Alcohol/week: 0.0 standard drinks  . Drug use: No    FAMILY HISTORY: Family History  Problem Relation Age of Onset  . Hypertension Mother   . High Cholesterol Mother   . Arthritis Father   . Hypertension Father   . Diabetes Father   . High Cholesterol Father   . Melanoma Maternal Grandmother   . Breast cancer Maternal Grandmother        and Aunt  . Hypertension Sister   . Diabetes Sister     ROS: Review of Systems  Constitutional: Positive for malaise/fatigue and weight loss.  Gastrointestinal: Negative for nausea and vomiting.  Musculoskeletal:       Negative for muscle weakness  Endo/Heme/Allergies:       Negative for polyphagia    PHYSICAL EXAM: Blood pressure 135/85, pulse 68, temperature 98.2 F (36.8 C), temperature source Oral, height _0  (1.575 m), weight 197  lb (89.4 kg), SpO2 98 %. Body mass index is 36.03 kg/m. Physical Exam  Constitutional: She is oriented to person, place, and time. She appears well-developed and well-nourished.  HENT:  Head: Normocephalic.  Cardiovascular: Normal rate.  Pulmonary/Chest: Effort normal.  Musculoskeletal: Normal range of motion.  Neurological: She is oriented to person, place, and time.  Skin: Skin is warm and dry.  Psychiatric: She has a normal mood and affect. Her  behavior is normal.  Vitals reviewed.   RECENT LABS AND TESTS: BMET    Component Value Date/Time   NA 141 02/18/2018 1137   K 4.2 02/18/2018 1137   CL 103 02/18/2018 1137   CO2 23 02/18/2018 1137   GLUCOSE 77 02/18/2018 1137   GLUCOSE 92 11/17/2017 1025   BUN 8 02/18/2018 1137   CREATININE 0.62 02/18/2018 1137   CREATININE 0.55 11/17/2017 1025   CALCIUM 9.4 02/18/2018 1137   CALCIUM 9.1 02/04/2007 2305   GFRNONAA 99 02/18/2018 1137   GFRNONAA 103 11/17/2017 1025   GFRAA 114 02/18/2018 1137   GFRAA 119 11/17/2017 1025   Lab Results  Component Value Date   HGBA1C 5.3 02/18/2018   HGBA1C 5.5 06/11/2016   HGBA1C 5.5 04/20/2013   Lab Results  Component Value Date   INSULIN 3.0 02/18/2018   CBC    Component Value Date/Time   WBC 5.1 02/18/2018 1137   WBC 4.3 11/17/2017 1025   RBC 4.37 02/18/2018 1137   RBC 4.09 11/17/2017 1025   HGB 13.9 02/18/2018 1137   HGB 13.3 11/17/2006 1545   HCT 43.0 02/18/2018 1137   HCT 39.1 11/17/2006 1545   PLT 330 11/17/2017 1025   PLT 256 11/17/2006 1545   MCV 98 (H) 02/18/2018 1137   MCV 95.5 11/17/2006 1545   MCH 31.8 02/18/2018 1137   MCH 32.0 11/17/2017 1025   MCHC 32.3 02/18/2018 1137   MCHC 33.9 11/17/2017 1025   RDW 13.7 02/18/2018 1137   RDW 12.9 11/17/2006 1545   LYMPHSABS 2.6 02/18/2018 1137   LYMPHSABS 1.9 11/17/2006 1545   MONOABS 0.8 06/11/2016 1044   MONOABS 0.5 11/17/2006 1545   EOSABS 0.1 02/18/2018 1137   BASOSABS 0.0 02/18/2018 1137   BASOSABS 0.0 11/17/2006  1545   Iron/TIBC/Ferritin/ %Sat    Component Value Date/Time   IRON 120 06/27/2010 0902   FERRITIN 42.7 06/27/2010 0902   IRONPCTSAT 26.1 06/27/2010 0902   Lipid Panel     Component Value Date/Time   CHOL 162 02/18/2018 1137   TRIG 59 02/18/2018 1137   HDL 87 02/18/2018 1137   CHOLHDL 2 06/11/2016 1044   VLDL 12.0 06/11/2016 1044   LDLCALC 63 02/18/2018 1137   Hepatic Function Panel     Component Value Date/Time   PROT 7.0 02/18/2018 1137   ALBUMIN 4.1 02/18/2018 1137   AST 20 02/18/2018 1137   ALT 22 02/18/2018 1137   ALKPHOS 74 02/18/2018 1137   BILITOT 0.7 02/18/2018 1137   BILIDIR 0.2 05/11/2015 1136      Component Value Date/Time   TSH 1.460 02/18/2018 1137   TSH 1.63 05/11/2015 1136   TSH 0.49 03/16/2014 1128    Ref. Range 11/17/2017 10:25  Vitamin D, 25-Hydroxy Latest Ref Range: 30 - 100 ng/mL 32    ASSESSMENT AND PLAN: Vitamin D deficiency - Plan: Vitamin D, Ergocalciferol, (DRISDOL) 50000 units CAPS capsule  Hyperglycemia  Class 2 severe obesity with serious comorbidity and body mass index (BMI) of 35.0 to 35.9 in adult, unspecified obesity type (HCC)  PLAN: Vitamin D Deficiency [name] was informed that low vitamin D levels contributes to fatigue and are associated with obesity, breast, and colon cancer. She agrees to continue to take prescription Vit D _0 ,000 IU every week #4 with no refills and will follow up for routine testing of vitamin D, at least 2-3 times per year. She was informed of the risk of over-replacement of vitamin D and agrees to not increase her dose  unless she discusses this with [place] first.  Hyperglycemia  [name] was started on a lower simple carbohydrate diet and will work on weight loss efforts. We will check HgA1C and Insulin level at next appointment.   Obesity [name] is currently in the action stage of change. As such, her goal is to continue with weight loss efforts She has agreed to follow the Category 1 plan +1 cup of  vegetables and 100 calories  [name] has been instructed to work up to a goal of 150 minutes of combined cardio and strengthening exercise per week for weight loss and overall health benefits. We discussed the following Behavioral Modification Stratagies today: increasing lean protein intake, increasing vegetables and work on meal planning and easy cooking plans  [name] has agreed to follow up with our clinic in 2 weeks. She was informed of the importance of frequent follow up visits to maximize her success with intensive lifestyle modifications for her multiple health conditions.   OBESITY BEHAVIORAL INTERVENTION VISIT  Today's visit was # 3   Starting weight: 196 lb Starting date: 02/18/18 Today's weight : 197 lb Today's date:06/22/18 Total lbs lost to date: 1 lb At least 15 minutes were spent on discussing the following behavioral intervention visit.   ASK: We discussed the diagnosis of obesity with [name] today and [name] agreed to give [place] permission to discuss obesity behavioral modification therapy today.  ASSESS: Chevon has the diagnosis of obesity and her BMI today is 36.02 Maude is in the action stage of change   ADVISE: [name] was educated on the multiple health risks of obesity as well as the benefit of weight loss to improve her health. She was advised of the need for long term treatment and the importance of lifestyle modifications to improve her current health and to decrease her risk of future health problems.  AGREE: Multiple dietary modification options and treatment options were discussed and  [name] agreed to follow the recommendations documented in the above note.  ARRANGE: [name] was educated on the importance of frequent visits to treat obesity as outlined per CMS and USPSTF guidelines and agreed to schedule her next follow up appointment today.  I, Renee Ramus, am acting as transcriptionist for Ilene Qua, MD  I have reviewed the  above documentation for accuracy and completeness, and I agree with the above. - Ilene Qua, MD

## 2018-06-24 DIAGNOSIS — M6281 Muscle weakness (generalized): Secondary | ICD-10-CM | POA: Diagnosis not present

## 2018-06-24 DIAGNOSIS — M545 Low back pain: Secondary | ICD-10-CM | POA: Diagnosis not present

## 2018-06-24 DIAGNOSIS — M5416 Radiculopathy, lumbar region: Secondary | ICD-10-CM | POA: Diagnosis not present

## 2018-06-24 DIAGNOSIS — M546 Pain in thoracic spine: Secondary | ICD-10-CM | POA: Diagnosis not present

## 2018-06-24 DIAGNOSIS — R296 Repeated falls: Secondary | ICD-10-CM | POA: Diagnosis not present

## 2018-07-01 DIAGNOSIS — M546 Pain in thoracic spine: Secondary | ICD-10-CM | POA: Diagnosis not present

## 2018-07-01 DIAGNOSIS — M5416 Radiculopathy, lumbar region: Secondary | ICD-10-CM | POA: Diagnosis not present

## 2018-07-01 DIAGNOSIS — R296 Repeated falls: Secondary | ICD-10-CM | POA: Diagnosis not present

## 2018-07-01 DIAGNOSIS — M6281 Muscle weakness (generalized): Secondary | ICD-10-CM | POA: Diagnosis not present

## 2018-07-01 DIAGNOSIS — M545 Low back pain: Secondary | ICD-10-CM | POA: Diagnosis not present

## 2018-07-02 ENCOUNTER — Encounter: Payer: Medicare Other | Admitting: Family Medicine

## 2018-07-09 ENCOUNTER — Ambulatory Visit (INDEPENDENT_AMBULATORY_CARE_PROVIDER_SITE_OTHER): Payer: Medicare Other | Admitting: Family Medicine

## 2018-07-09 VITALS — BP 128/80 | HR 80 | Temp 97.9°F | Ht 62.0 in | Wt 197.0 lb

## 2018-07-09 DIAGNOSIS — Z6836 Body mass index (BMI) 36.0-36.9, adult: Secondary | ICD-10-CM | POA: Diagnosis not present

## 2018-07-09 DIAGNOSIS — E559 Vitamin D deficiency, unspecified: Secondary | ICD-10-CM | POA: Diagnosis not present

## 2018-07-09 DIAGNOSIS — M459 Ankylosing spondylitis of unspecified sites in spine: Secondary | ICD-10-CM | POA: Diagnosis not present

## 2018-07-13 NOTE — Progress Notes (Signed)
Office: [phone]  /  Fax: [phone]   HPI:   Chief Complaint: OBESITY [name] is here to discuss her progress with her obesity treatment plan. We advised her to follow the Category 1 plan +1 cup of vegetables and 100 calories at her most recent visit.  She is instead currently keeping a food journal with 1200 calories and is following her eating plan approximately 50% of the time. She is unsure of protein intake.  She states she is exercising 0 minutes 0 times per week. [name] is currently under a lot of stress related to taking care of elderly parents.  Her weight is 197 lb (89.4 kg) today and has maintained weight over a period of 2 weeks since her last visit. She has lost 0 lbs since starting treatment with [place].   Vitamin D deficiency [name] has a diagnosis of vitamin D deficiency. [name] is currently taking vit D and she admits fatigue but denies nausea, vomiting or muscle weakness.  Ankylosing Spondylitis [name] is on prednisone chronically and is flaring currently.  ALLERGIES: Allergies  Allergen Reactions  . Aspirin     REACTION: intolerance-gi upset  . Codeine   . Morphine   . Sulfonamide Derivatives     MEDICATIONS: Current Outpatient Medications on File Prior to Visit  Medication Sig Dispense Refill  . b complex vitamins capsule Take 1 capsule by mouth daily.    . cephALEXin (KEFLEX) 500 MG capsule TAKE 1 CAPSULE BY MOUTH FOUR TIMES A DAY UNTIL FINISHED  0  . cetirizine (ZYRTEC) 10 MG tablet Take 1 tablet (10 mg total) by mouth 2 (two) times daily as needed for allergies (rash). 30 tablet 11  . Cholecalciferol (VITAMIN D3) 5000 units TABS Take by mouth.    . ferrous sulfate 325 (65 FE) MG tablet Take 325 mg by mouth daily with breakfast.    . multivitamin-iron-minerals-folic acid (CENTRUM) chewable tablet Chew 1 tablet by mouth daily.    . predniSONE (DELTASONE) 5 MG tablet Take 5 mg by mouth daily with breakfast.    . ranitidine (ZANTAC) 150 MG tablet Take  1 tablet (150 mg total) by mouth 2 (two) times daily as needed for heartburn (rash). 60 tablet 1  . Vitamin D, Ergocalciferol, (DRISDOL) 50000 units CAPS capsule Take 1 capsule (50,000 Units total) by mouth every 7 (seven) days. 4 capsule 0   No current facility-administered medications on file prior to visit.     PAST MEDICAL HISTORY: Past Medical History:  Diagnosis Date  . Abdominal pain, other specified site 03/31/2008   Centricity Description: FLANK PAIN, RIGHT Qualifier: Diagnosis of  By: Alwyn Ren MD, Chrissie Noa   Centricity Description: ABDOMINAL PAIN OTHER SPECIFIED SITE Qualifier: Diagnosis of  By: Janit Bern    . Acute sprain or strain of cervical region 04/10/2012  . ALLERGIC RHINITIS 06/26/2007   Qualifier: Diagnosis of  By: Charlsie Quest RMA, Lucy    . ANKYLOSING SPONDYLITIS 03/31/2008   Qualifier: Diagnosis of  By: Alwyn Ren MD, Chrissie Noa    . Ankylosing spondylitis of multiple sites in spine (HCC)   . Arthritis   . Bariatric surgery status 01/14/2008   Qualifier: Diagnosis of  By: Janit Bern    . Blood in stool 12/02/2008   Qualifier: Diagnosis of  By: Janit Bern    . Calf pain 10/13/2012  . CHEST PAIN UNSPECIFIED 10/11/2009   Qualifier: Diagnosis of  By: Arlyce Dice MD, Barbette Hair   . CONSTIPATION 04/06/2010   Qualifier: Diagnosis of  By: Laury Axon  DO, Myrene Buddy    . CORNEAL ABRASION, LEFT 01/22/2010   Qualifier: Diagnosis of  By: Janit Bern    . Gastritis   . GERD 06/26/2007   Qualifier: Diagnosis of  By: Charlsie Quest RMA, Lucy    . GERD (gastroesophageal reflux disease)   . Hiatal hernia   . Hypertension   . Hypoglycemia, unspecified 01/30/2009   Qualifier: Diagnosis of  By: Janit Bern    . IBS 06/26/2007   Qualifier: Diagnosis of  By: Charlsie Quest RMA, Lucy    . LEG CRAMPS, NOCTURNAL 11/14/2010   Qualifier: Diagnosis of  By: Floydene Flock    . Long-term current use of steroids 04/20/2013  . LYMPHADENOPATHY 06/10/2007   Qualifier: Diagnosis of  By: Janit Bern    . MORBID OBESITY  06/26/2007   Qualifier: Diagnosis of  By: Charlsie Quest RMA, Lucy    . MUSCLE PAIN 03/31/2008   Qualifier: Diagnosis of  By: Alwyn Ren MD, Chrissie Noa    . Noninfectious gastroenteritis and colitis 03/17/2013  . Obesity (BMI 30-39.9) 03/16/2014  . OSA (obstructive sleep apnea) 03/08/2016  . 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 Seasonal allergies 05/14/2015  . THYROID CYST 06/26/2007   Qualifier: Diagnosis of  By: Charlsie Quest RMA, Lucy    . Thyroid nodule   . THYROID NODULE, HX OF 06/10/2007   Qualifier: Diagnosis of  By: Janit Bern      PAST SURGICAL HISTORY: Past Surgical History:  Procedure Laterality Date  . BARIATRIC SURGERY    . BREAST REDUCTION SURGERY  1996  . ESOPHAGOGASTRODUODENOSCOPY    . pelvis replacement  2010  . TOTAL HIP ARTHROPLASTY     x 2 left, one revision    SOCIAL HISTORY: Social History   Tobacco Use  . Smoking status: Never Smoker  . Smokeless tobacco: Never Used  Substance Use Topics  . Alcohol use: No    Alcohol/week: 0.0 standard drinks  . Drug use: No    FAMILY HISTORY: Family History  Problem Relation Age of Onset  . Hypertension Mother   . High Cholesterol Mother   . Arthritis Father   . Hypertension Father   . Diabetes Father   . High Cholesterol Father   . Melanoma Maternal Grandmother   . Breast cancer Maternal Grandmother        and Aunt  . Hypertension Sister   . Diabetes Sister     ROS: Review of Systems  Constitutional: Positive for malaise/fatigue. Negative for weight loss.  Gastrointestinal: Negative for nausea and vomiting.  Musculoskeletal:       Negative for muscle weakness.     PHYSICAL EXAM: Blood pressure 128/80, pulse 80, temperature 97.9 F (36.6 C), temperature source Oral, height 5\' 2"  (1.575 m), weight 197 lb (89.4 kg), SpO2 98 %. Body mass index is 36.03 kg/m. Physical Exam  Constitutional: She is oriented to person, place, and time. She appears  well-developed and well-nourished.  Cardiovascular: Normal rate.  Pulmonary/Chest: Effort normal.  Musculoskeletal: Normal range of motion.  Neurological: She is oriented to person, place, and time.  Skin: Skin is warm and dry.  Psychiatric: She has a normal mood and affect. Her behavior is normal.  Vitals reviewed.   RECENT LABS AND TESTS: BMET    Component Value Date/Time   NA 141 02/18/2018 1137   K 4.2 02/18/2018 1137   CL 103 02/18/2018 1137   CO2 23 02/18/2018  1137   GLUCOSE 77 02/18/2018 1137   GLUCOSE 92 11/17/2017 1025   BUN 8 02/18/2018 1137   CREATININE 0.62 02/18/2018 1137   CREATININE 0.55 11/17/2017 1025   CALCIUM 9.4 02/18/2018 1137   CALCIUM 9.1 02/04/2007 2305   GFRNONAA 99 02/18/2018 1137   GFRNONAA 103 11/17/2017 1025   GFRAA 114 02/18/2018 1137   GFRAA 119 11/17/2017 1025   Lab Results  Component Value Date   HGBA1C 5.3 02/18/2018   HGBA1C 5.5 06/11/2016   HGBA1C 5.5 04/20/2013   Lab Results  Component Value Date   INSULIN 3.0 02/18/2018   CBC    Component Value Date/Time   WBC 5.1 02/18/2018 1137   WBC 4.3 11/17/2017 1025   RBC 4.37 02/18/2018 1137   RBC 4.09 11/17/2017 1025   HGB 13.9 02/18/2018 1137   HGB 13.3 11/17/2006 1545   HCT 43.0 02/18/2018 1137   HCT 39.1 11/17/2006 1545   PLT 330 11/17/2017 1025   PLT 256 11/17/2006 1545   MCV 98 (H) 02/18/2018 1137   MCV 95.5 11/17/2006 1545   MCH 31.8 02/18/2018 1137   MCH 32.0 11/17/2017 1025   MCHC 32.3 02/18/2018 1137   MCHC 33.9 11/17/2017 1025   RDW 13.7 02/18/2018 1137   RDW 12.9 11/17/2006 1545   LYMPHSABS 2.6 02/18/2018 1137   LYMPHSABS 1.9 11/17/2006 1545   MONOABS 0.8 06/11/2016 1044   MONOABS 0.5 11/17/2006 1545   EOSABS 0.1 02/18/2018 1137   BASOSABS 0.0 02/18/2018 1137   BASOSABS 0.0 11/17/2006 1545   Iron/TIBC/Ferritin/ %Sat    Component Value Date/Time   IRON 120 06/27/2010 0902   FERRITIN 42.7 06/27/2010 0902   IRONPCTSAT 26.1 06/27/2010 0902   Lipid Panel      Component Value Date/Time   CHOL 162 02/18/2018 1137   TRIG 59 02/18/2018 1137   HDL 87 02/18/2018 1137   CHOLHDL 2 06/11/2016 1044   VLDL 12.0 06/11/2016 1044   LDLCALC 63 02/18/2018 1137   Hepatic Function Panel     Component Value Date/Time   PROT 7.0 02/18/2018 1137   ALBUMIN 4.1 02/18/2018 1137   AST 20 02/18/2018 1137   ALT 22 02/18/2018 1137   ALKPHOS 74 02/18/2018 1137   BILITOT 0.7 02/18/2018 1137   BILIDIR 0.2 05/11/2015 1136      Component Value Date/Time   TSH 1.460 02/18/2018 1137   TSH 1.63 05/11/2015 1136   TSH 0.49 03/16/2014 1128   Results for WILBERT, SCHOUTEN (MRN [id number]) as of 07/13/2018 08:54  Ref. Range 02/18/2018 11:37  Vitamin D, 25-Hydroxy Latest Ref Range: 30.0 - 100.0 ng/mL 19.8 (L)   ASSESSMENT AND PLAN: Vitamin D deficiency  Ankylosing spondylitis, unspecified site of spine (HCC)  Class 2 severe obesity with serious comorbidity and body mass index (BMI) of 36.0 to 36.9 in adult, unspecified obesity type (HCC)  PLAN:  Vitamin D Deficiency [name] was informed that low vitamin D levels contributes to fatigue and are associated with obesity, breast, and colon cancer. She agrees to continue to take prescription Vit D @50 ,000 IU every week #4 with no refills and will follow up for routine testing of vitamin D, at least 2-3 times per year. [name] was informed of the risk of over-replacement of vitamin D and agrees to not increase her dose unless she discusses this with [place] first. [name] agrees to follow up as directed.  Ankylosing Spondylitis [name] will follow up with rheumatology for this condition.   Obesity [name] is currently in the  action stage of change. As such, her goal is to continue with weight loss efforts She has agreed to keep a food journal with 1100-1200 calories and 85+ protein  [name] has been instructed to work up to a goal of 150 minutes of combined cardio and strengthening exercise per week for weight loss and overall  health benefits. We discussed the following Behavioral Modification Strategies today: increasing lean protein intake, increasing vegetables, better snacking choices, planning for success,  and work on meal planning and easy cooking plans  [name] has agreed to follow up with our clinic in 2 weeks. She was informed of the importance of frequent follow up visits to maximize her success with intensive lifestyle modifications for her multiple health conditions.   OBESITY BEHAVIORAL INTERVENTION VISIT  Today's visit was # 6  Starting weight: 196 lbs Starting date: 02/18/18 Today's weight : 197 lbs Today's date: 07/09/2018 Total lbs lost to date: 0 At least 15 minutes were spent on discussing the following behavioral intervention visit.   ASK: We discussed the diagnosis of obesity with [name] today and [name] agreed to give [place] permission to discuss obesity behavioral modification therapy today.  ASSESS: [name] has the diagnosis of obesity and her BMI today is 36.02. [name] is in the action stage of change   ADVISE: [name] was educated on the multiple health risks of obesity as well as the benefit of weight loss to improve her health. She was advised of the need for long term treatment and the importance of lifestyle modifications to improve her current health and to decrease her risk of future health problems.  AGREE: Multiple dietary modification options and treatment options were discussed and  [name] agreed to follow the recommendations documented in the above note.  ARRANGE: [name] was educated on the importance of frequent visits to treat obesity as outlined per CMS and USPSTF guidelines and agreed to schedule her next follow up appointment today.  I, [place] Crane, am acting as transcriptionist for Filbert Schilder, MD.  I have reviewed the above documentation for accuracy and completeness, and I agree with the above. - Debbra Riding, MD

## 2018-07-14 ENCOUNTER — Encounter: Payer: Medicare Other | Admitting: Family Medicine

## 2018-07-27 ENCOUNTER — Encounter (INDEPENDENT_AMBULATORY_CARE_PROVIDER_SITE_OTHER): Payer: Self-pay

## 2018-07-27 ENCOUNTER — Ambulatory Visit (INDEPENDENT_AMBULATORY_CARE_PROVIDER_SITE_OTHER): Payer: Self-pay | Admitting: Family Medicine

## 2018-07-30 ENCOUNTER — Ambulatory Visit (INDEPENDENT_AMBULATORY_CARE_PROVIDER_SITE_OTHER): Payer: Medicare Other | Admitting: Family Medicine

## 2018-07-30 ENCOUNTER — Encounter: Payer: Self-pay | Admitting: Family Medicine

## 2018-07-30 VITALS — BP 144/79 | HR 73 | Temp 98.8°F | Resp 16 | Ht 62.0 in | Wt 200.0 lb

## 2018-07-30 DIAGNOSIS — I10 Essential (primary) hypertension: Secondary | ICD-10-CM | POA: Diagnosis not present

## 2018-07-30 DIAGNOSIS — E559 Vitamin D deficiency, unspecified: Secondary | ICD-10-CM | POA: Diagnosis not present

## 2018-07-30 DIAGNOSIS — R1013 Epigastric pain: Secondary | ICD-10-CM

## 2018-07-30 DIAGNOSIS — Z0000 Encounter for general adult medical examination without abnormal findings: Secondary | ICD-10-CM

## 2018-07-30 DIAGNOSIS — Z9884 Bariatric surgery status: Secondary | ICD-10-CM | POA: Diagnosis not present

## 2018-07-30 DIAGNOSIS — R079 Chest pain, unspecified: Secondary | ICD-10-CM | POA: Diagnosis not present

## 2018-07-30 DIAGNOSIS — E669 Obesity, unspecified: Secondary | ICD-10-CM

## 2018-07-30 LAB — CBC WITH DIFFERENTIAL/PLATELET
Basophils Absolute: 0 10*3/uL (ref 0.0–0.1)
Basophils Relative: 1.1 % (ref 0.0–3.0)
EOS PCT: 1.9 % (ref 0.0–5.0)
Eosinophils Absolute: 0.1 10*3/uL (ref 0.0–0.7)
HCT: 40.3 % (ref 36.0–46.0)
Hemoglobin: 13.6 g/dL (ref 12.0–15.0)
LYMPHS ABS: 2.1 10*3/uL (ref 0.7–4.0)
Lymphocytes Relative: 47.8 % — ABNORMAL HIGH (ref 12.0–46.0)
MCHC: 33.9 g/dL (ref 30.0–36.0)
MCV: 97.5 fl (ref 78.0–100.0)
MONOS PCT: 9.7 % (ref 3.0–12.0)
Monocytes Absolute: 0.4 10*3/uL (ref 0.1–1.0)
Neutro Abs: 1.8 10*3/uL (ref 1.4–7.7)
Neutrophils Relative %: 39.5 % — ABNORMAL LOW (ref 43.0–77.0)
Platelets: 317 10*3/uL (ref 150.0–400.0)
RBC: 4.13 Mil/uL (ref 3.87–5.11)
RDW: 12.9 % (ref 11.5–15.5)
WBC: 4.5 10*3/uL (ref 4.0–10.5)

## 2018-07-30 LAB — COMPREHENSIVE METABOLIC PANEL
ALT: 15 U/L (ref 0–35)
AST: 18 U/L (ref 0–37)
Albumin: 4 g/dL (ref 3.5–5.2)
Alkaline Phosphatase: 79 U/L (ref 39–117)
BUN: 14 mg/dL (ref 6–23)
CHLORIDE: 105 meq/L (ref 96–112)
CO2: 27 meq/L (ref 19–32)
Calcium: 8.9 mg/dL (ref 8.4–10.5)
Creatinine, Ser: 0.67 mg/dL (ref 0.40–1.20)
GFR: 115.51 mL/min (ref >60.00)
GLUCOSE: 76 mg/dL (ref 70–99)
POTASSIUM: 4.5 meq/L (ref 3.5–5.1)
Sodium: 140 mEq/L (ref 135–145)
Total Bilirubin: 0.4 mg/dL (ref 0.2–1.2)
Total Protein: 6.8 g/dL (ref 6.0–8.3)

## 2018-07-30 LAB — LIPID PANEL
CHOL/HDL RATIO: 2
Cholesterol: 151 mg/dL (ref 0–200)
HDL: 77.1 mg/dL (ref >39.00)
LDL CALC: 60 mg/dL (ref 0–99)
NONHDL: 73.83
Triglycerides: 70 mg/dL (ref 0.0–149.0)
VLDL: 14 mg/dL (ref 0.0–40.0)

## 2018-07-30 LAB — TSH: TSH: 1.43 u[IU]/mL (ref 0.35–4.50)

## 2018-07-30 LAB — VITAMIN B12: Vitamin B-12: 451 pg/mL (ref 211–911)

## 2018-07-30 MED ORDER — PANTOPRAZOLE SODIUM 40 MG PO TBEC: 40.0000 mg | DELAYED_RELEASE_TABLET | Freq: Every day | ORAL | 3 refills | Status: DC

## 2018-07-30 NOTE — Assessment & Plan Note (Signed)
Well controlled, no changes to meds. Encouraged heart healthy diet such as the DASH diet and exercise as tolerated.  °

## 2018-07-30 NOTE — Assessment & Plan Note (Signed)
?   Gi or cardiac No changes on ekg  Pt will f/u cardiology and GI Start protonix daily If cp returns -- go to ER

## 2018-07-30 NOTE — Progress Notes (Signed)
Subjective:     [name] is a 62 y.o. female and is here for a comprehensive physical exam. The patient reports problems - still having chest pain.  she will f/u with cardiology and GI  no chest pain today.  Social History   Socioeconomic History  . Marital status: Married    Spouse name: Gerica Koble  . Number of children: Not on file  . Years of education: Not on file  . Highest education level: Not on file  Occupational History  . Occupation: retired  Engineer, production  . Financial resource strain: Not on file  . Food insecurity:    Worry: Not on file    Inability: Not on file  . Transportation needs:    Medical: Not on file    Non-medical: Not on file  Tobacco Use  . Smoking status: Never Smoker  . Smokeless tobacco: Never Used  Substance and Sexual Activity  . Alcohol use: No    Alcohol/week: 0.0 standard drinks  . Drug use: No  . Sexual activity: Yes  Lifestyle  . Physical activity:    Days per week: Not on file    Minutes per session: Not on file  . Stress: Not on file  Relationships  . Social connections:    Talks on phone: Not on file    Gets together: Not on file    Attends religious service: Not on file    Active member of club or organization: Not on file    Attends meetings of clubs or organizations: Not on file    Relationship status: Not on file  . Intimate partner violence:    Fear of current or ex partner: Not on file    Emotionally abused: Not on file    Physically abused: Not on file    Forced sexual activity: Not on file  Other Topics Concern  . Not on file  Social History Narrative   Occupation: Sports coach   Daily Caffeine use- 1 cup daily      Health Maintenance  Topic Date Due  . MAMMOGRAM  08/12/2017  . INFLUENZA VACCINE  04/30/2018  . PAP SMEAR  08/12/2018  . COLONOSCOPY  10/25/2019  . TETANUS/TDAP  03/16/2024  . Hepatitis C Screening  Completed  . HIV Screening  Completed    The following portions of the patient's  history were reviewed and updated as appropriate: She  has a past medical history of Abdominal pain, other specified site (03/31/2008), Acute sprain or strain of cervical region (04/10/2012), ALLERGIC RHINITIS (06/26/2007), ANKYLOSING SPONDYLITIS (03/31/2008), Ankylosing spondylitis of multiple sites in spine [facility] [place]), Arthritis, Bariatric surgery status (01/14/2008), Blood in stool (12/02/2008), Calf pain (10/13/2012), CHEST PAIN UNSPECIFIED (10/11/2009), CONSTIPATION (04/06/2010), CORNEAL ABRASION, LEFT (01/22/2010), Gastritis, GERD (06/26/2007), GERD (gastroesophageal reflux disease), Hiatal hernia, Hypertension, Hypoglycemia, unspecified (01/30/2009), IBS (06/26/2007), LEG CRAMPS, NOCTURNAL (11/14/2010), Long-term current use of steroids (04/20/2013), LYMPHADENOPATHY (06/10/2007), MORBID OBESITY (06/26/2007), MUSCLE PAIN (03/31/2008), Noninfectious gastroenteritis and colitis (03/17/2013), Obesity (BMI 30-39.9) (03/16/2014), OSA (obstructive sleep apnea) (03/08/2016), Other specified disease of white blood cells (07/23/2010), Seasonal allergies (05/14/2015), THYROID CYST (06/26/2007), Thyroid nodule, and THYROID NODULE, HX OF (06/10/2007). She does not have any pertinent problems on file. She  has a past surgical history that includes Esophagogastroduodenoscopy; Breast reduction surgery (1996); Total hip arthroplasty; Bariatric Surgery; and pelvis replacement (2010). Her family history includes Arthritis in her father; Breast cancer in her maternal grandmother; Diabetes in her father and sister; High Cholesterol in her father and mother; Hypertension in  her father, mother, and sister; Melanoma in her maternal grandmother. She  reports that she has never smoked. She has never used smokeless tobacco. She reports that she does not drink alcohol or use drugs. She has a current medication list which includes the following prescription(s): b complex vitamins, cetirizine, vitamin d3, ferrous sulfate, multivitamin-iron-minerals-folic acid,  prednisone, ranitidine, vitamin d (ergocalciferol), and pantoprazole. Current Outpatient Medications on File Prior to Visit  Medication Sig Dispense Refill  . b complex vitamins capsule Take 1 capsule by mouth daily.    . cetirizine (ZYRTEC) 10 MG tablet Take 1 tablet (10 mg total) by mouth 2 (two) times daily as needed for allergies (rash). 30 tablet 11  . Cholecalciferol (VITAMIN D3) 5000 units TABS Take by mouth.    . ferrous sulfate 325 (65 FE) MG tablet Take 325 mg by mouth daily with breakfast.    . multivitamin-iron-minerals-folic acid (CENTRUM) chewable tablet Chew 1 tablet by mouth daily.    . predniSONE (DELTASONE) 5 MG tablet Take 5 mg by mouth daily with breakfast.    . ranitidine (ZANTAC) 150 MG tablet Take 1 tablet (150 mg total) by mouth 2 (two) times daily as needed for heartburn (rash). 60 tablet 1  . Vitamin D, Ergocalciferol, (DRISDOL) 50000 units CAPS capsule Take 1 capsule (50,000 Units total) by mouth every 7 (seven) days. 4 capsule 0   No current facility-administered medications on file prior to visit.    She is allergic to aspirin; codeine; morphine; and sulfonamide derivatives.  Review of Systems Review of Systems  Constitutional: Negative for activity change, appetite change and fatigue.  HENT: Negative for hearing loss, congestion, tinnitus and ear discharge.  dentist q48m Eyes: Negative for visual disturbance (see optho q1y -- vision corrected to 20/20 with glasses).  Respiratory: Negative for cough, chest tightness and shortness of breath.   Cardiovascular: Negative for chest pain, palpitations and leg swelling.  Gastrointestinal: Negative for abdominal pain, diarrhea, constipation and abdominal distention.  Genitourinary: Negative for urgency, frequency, decreased urine volume and difficulty urinating.  Musculoskeletal: Negative for back pain, arthralgias and gait problem.  Skin: Negative for color change, pallor and rash.  Neurological: Negative for  dizziness, light-headedness, numbness and headaches.  Hematological: Negative for adenopathy. Does not bruise/bleed easily.  Psychiatric/Behavioral: Negative for suicidal ideas, confusion, sleep disturbance, self-injury, dysphoric mood, decreased concentration and agitation.       Objective:    BP (!) 144/79 (BP Location: Left Arm, Patient Position: Sitting, Cuff Size: Large)   Pulse 73   Temp 98.8 F (37.1 C) (Oral)   Resp 16   Ht 5\' 2"  (1.575 m)   Wt 200 lb (90.7 kg)   SpO2 100%   BMI 36.58 kg/m  General appearance: alert, cooperative, appears stated age and no distress Head: Normocephalic, without obvious abnormality, atraumatic Eyes: conjunctivae/corneas clear. PERRL, EOM's intact. Fundi benign. Ears: normal TM's and external ear canals both ears Nose: Nares normal. Septum midline. Mucosa normal. No drainage or sinus tenderness. Throat: lips, mucosa, and tongue normal; teeth and gums normal Neck: no adenopathy, no carotid bruit, no JVD, supple, symmetrical, trachea midline and thyroid not enlarged, symmetric, no tenderness/mass/nodules Back: symmetric, no curvature. ROM normal. No CVA tenderness. Lungs: clear to auscultation bilaterally Breasts: gyn Heart: regular rate and rhythm, S1, S2 normal, no murmur, click, rub or gallop Abdomen: soft, non-tender; bowel sounds normal; no masses,  no organomegaly Pelvic: deferred--gyn Extremities: extremities normal, atraumatic, no cyanosis or edema Pulses: 2+ and symmetric Skin: Skin color, texture, turgor  normal. No rashes or lesions Lymph nodes: Cervical, supraclavicular, and axillary nodes normal. Neurologic: Alert and oriented X 3, normal strength and tone. Normal symmetric reflexes. Normal coordination and gait    ekg-- no changes from previous 01/2018 Assessment:    Healthy female exam.      Plan:    ghm utd Check labs  See After Visit Summary for Counseling Recommendations    1. Preventative health care See above -  CBC with Differential/Platelet - Lipid panel - Comprehensive metabolic panel - TSH  2. Chest pain, unspecified type  - EKG 12-Lead  3. Dyspepsia F/u gi  - pantoprazole (PROTONIX) 40 MG tablet; Take 1 tablet (40 mg total) by mouth daily.  Dispense: 30 tablet; Refill: 3  4. Gastric bypass status for obesity F/u healthy weight and wellness  - Vitamin B12 - Vitamin D 1,25 dihydroxy  5. Essential hypertension Well controlled, no changes to meds. Encouraged heart healthy diet such as the DASH diet and exercise as tolerated.   6. Obesity (BMI 30-39.9)

## 2018-07-30 NOTE — Assessment & Plan Note (Signed)
con't healthy weight and wellness 

## 2018-07-30 NOTE — Patient Instructions (Signed)
Preventive Care 40-64 Years, Female Preventive care refers to lifestyle choices and visits with your health care provider that can promote health and wellness. What does preventive care include?  A yearly physical exam. This is also called an annual well check.  Dental exams once or twice a year.  Routine eye exams. Ask your health care provider how often you should have your eyes checked.  Personal lifestyle choices, including: ? Daily care of your teeth and gums. ? Regular physical activity. ? Eating a healthy diet. ? Avoiding tobacco and drug use. ? Limiting alcohol use. ? Practicing safe sex. ? Taking low-dose aspirin daily starting at age 58. ? Taking vitamin and mineral supplements as recommended by your health care provider. What happens during an annual well check? The services and screenings done by your health care provider during your annual well check will depend on your age, overall health, lifestyle risk factors, and family history of disease. Counseling Your health care provider may ask you questions about your:  Alcohol use.  Tobacco use.  Drug use.  Emotional well-being.  Home and relationship well-being.  Sexual activity.  Eating habits.  Work and work Statistician.  Method of birth control.  Menstrual cycle.  Pregnancy history.  Screening You may have the following tests or measurements:  Height, weight, and BMI.  Blood pressure.  Lipid and cholesterol levels. These may be checked every 5 years, or more frequently if you are over 81 years old.  Skin check.  Lung cancer screening. You may have this screening every year starting at age 78 if you have a 30-pack-year history of smoking and currently smoke or have quit within the past 15 years.  Fecal occult blood test (FOBT) of the stool. You may have this test every year starting at age 65.  Flexible sigmoidoscopy or colonoscopy. You may have a sigmoidoscopy every 5 years or a colonoscopy  every 10 years starting at age 30.  Hepatitis C blood test.  Hepatitis B blood test.  Sexually transmitted disease (STD) testing.  Diabetes screening. This is done by checking your blood sugar (glucose) after you have not eaten for a while (fasting). You may have this done every 1-3 years.  Mammogram. This may be done every 1-2 years. Talk to your health care provider about when you should start having regular mammograms. This may depend on whether you have a family history of breast cancer.  BRCA-related cancer screening. This may be done if you have a family history of breast, ovarian, tubal, or peritoneal cancers.  Pelvic exam and Pap test. This may be done every 3 years starting at age 80. Starting at age 36, this may be done every 5 years if you have a Pap test in combination with an HPV test.  Bone density scan. This is done to screen for osteoporosis. You may have this scan if you are at high risk for osteoporosis.  Discuss your test results, treatment options, and if necessary, the need for more tests with your health care provider. Vaccines Your health care provider may recommend certain vaccines, such as:  Influenza vaccine. This is recommended every year.  Tetanus, diphtheria, and acellular pertussis (Tdap, Td) vaccine. You may need a Td booster every 10 years.  Varicella vaccine. You may need this if you have not been vaccinated.  Zoster vaccine. You may need this after age 5.  Measles, mumps, and rubella (MMR) vaccine. You may need at least one dose of MMR if you were born in  1955 or later. You may also need a second dose.  Pneumococcal 13-valent conjugate (PCV13) vaccine. You may need this if you have certain conditions and were not previously vaccinated.  Pneumococcal polysaccharide (PPSV23) vaccine. You may need one or two doses if you smoke cigarettes or if you have certain conditions.  Meningococcal vaccine. You may need this if you have certain  conditions.  Hepatitis A vaccine. You may need this if you have certain conditions or if you travel or work in places where you may be exposed to hepatitis A.  Hepatitis B vaccine. You may need this if you have certain conditions or if you travel or work in places where you may be exposed to hepatitis B.  Haemophilus influenzae type b (Hib) vaccine. You may need this if you have certain conditions.  Talk to your health care provider about which screenings and vaccines you need and how often you need them. This information is not intended to replace advice given to you by your health care provider. Make sure you discuss any questions you have with your health care provider. Document Released: 10/13/2015 Document Revised: 06/05/2016 Document Reviewed: 07/18/2015 Elsevier Interactive Patient Education  2018 Elsevier Inc.  

## 2018-07-30 NOTE — Assessment & Plan Note (Signed)
con't with healthy weight and wellness 

## 2018-08-02 LAB — VITAMIN D 1,25 DIHYDROXY
VITAMIN D 1, 25 (OH) TOTAL: 75 pg/mL — AB (ref 18–72)
VITAMIN D2 1, 25 (OH): 31 pg/mL
Vitamin D3 1, 25 (OH)2: 44 pg/mL

## 2018-08-03 ENCOUNTER — Encounter (INDEPENDENT_AMBULATORY_CARE_PROVIDER_SITE_OTHER): Payer: Self-pay | Admitting: Family Medicine

## 2018-08-03 ENCOUNTER — Ambulatory Visit (INDEPENDENT_AMBULATORY_CARE_PROVIDER_SITE_OTHER): Payer: Medicare Other | Admitting: Family Medicine

## 2018-08-03 VITALS — BP 131/82 | HR 89 | Temp 98.1°F | Ht 62.0 in | Wt 197.0 lb

## 2018-08-03 DIAGNOSIS — E559 Vitamin D deficiency, unspecified: Secondary | ICD-10-CM | POA: Diagnosis not present

## 2018-08-03 DIAGNOSIS — F3289 Other specified depressive episodes: Secondary | ICD-10-CM | POA: Diagnosis not present

## 2018-08-03 DIAGNOSIS — Z6836 Body mass index (BMI) 36.0-36.9, adult: Secondary | ICD-10-CM

## 2018-08-03 MED ORDER — BUPROPION HCL ER (SR) 150 MG PO TB12: 150.0000 mg | ORAL_TABLET | Freq: Every day | ORAL | 0 refills | Status: DC

## 2018-08-03 NOTE — Progress Notes (Signed)
Office: [phone]  /  Fax: [phone]   HPI:   Chief Complaint: OBESITY [name] is here to discuss her progress with her obesity treatment plan. She is keeping a food journal with 1100-1200 calories and 85+ protein  and is following her eating plan approximately 50 % of the time. She has not been journaling consistently.  She states she is exercising 0 minutes 0 times per week. [name] still has some restrictions from her RNY gastric bypass done in 2006. She states that she is eating out more than she should, eating out at dinner quite a bit.  Handout was given for high protein foods. She denies polyphagia.  Her weight is 197 lb (89.4 kg) today and has not lost weight since her last visit. She has lost 0 lbs since starting treatment with [place].  Vitamin D deficiency [name] has a diagnosis of vitamin D deficiency. She is currently taking prescription Vit D and denies nausea, vomiting or muscle weakness. [name]'s Vit D levels were checked with her PCP last week and they were within normal limits.   Depression with emotional eating behaviors [name] is struggling with emotional eating and using food for comfort to the extent that it is negatively impacting her health. She often snacks when she is not hungry. [name] sometimes feels she is out of control and then feels guilty that she made poor food choices. [name] endorses mindless eating and often eats when she is not hungry. She is status post gastric bypass in 2006 and still has restriction from the surgery.  She has been working on behavior modification techniques to help reduce her emotional eating and has been somewhat successful. She shows no sign of suicidal or homicidal ideations.   Depression screen [facility] 2/9 02/18/2018 02/09/2018 06/11/2016 03/05/2016  Decreased Interest 1 0 0 0  Down, Depressed, Hopeless 0 0 0 1  PHQ - 2 Score 1 0 0 1  Altered sleeping 3 - - -  Tired, decreased energy 3 - - -  Change in appetite 1 - - -  Trouble  concentrating 0 - - -  Moving slowly or fidgety/restless 0 - - -  Suicidal thoughts 0 - - -  PHQ-9 Score 8 - - -  Difficult doing work/chores Not difficult at all - - -      ALLERGIES: Allergies  Allergen Reactions  . Aspirin     REACTION: intolerance-gi upset  . Codeine   . Morphine   . Sulfonamide Derivatives     MEDICATIONS: Current Outpatient Medications on File Prior to Visit  Medication Sig Dispense Refill  . b complex vitamins capsule Take 1 capsule by mouth daily.    . cetirizine (ZYRTEC) 10 MG tablet Take 1 tablet (10 mg total) by mouth 2 (two) times daily as needed for allergies (rash). 30 tablet 11  . ferrous sulfate 325 (65 FE) MG tablet Take 325 mg by mouth daily with breakfast.    . multivitamin-iron-minerals-folic acid (CENTRUM) chewable tablet Chew 1 tablet by mouth daily.    . pantoprazole (PROTONIX) 40 MG tablet Take 1 tablet (40 mg total) by mouth daily. 30 tablet 3  . predniSONE (DELTASONE) 5 MG tablet Take 5 mg by mouth daily with breakfast.    . ranitidine (ZANTAC) 150 MG tablet Take 1 tablet (150 mg total) by mouth 2 (two) times daily as needed for heartburn (rash). 60 tablet 1  . Vitamin D, Ergocalciferol, (DRISDOL) 50000 units CAPS capsule Take 1 capsule (50,000 Units total) by mouth every  7 (seven) days. 4 capsule 0   No current facility-administered medications on file prior to visit.     PAST MEDICAL HISTORY: Past Medical History:  Diagnosis Date  . Abdominal pain, other specified site 03/31/2008   Centricity Description: FLANK PAIN, RIGHT Qualifier: Diagnosis of  By: Alwyn Ren MD, Chrissie Noa   Centricity Description: ABDOMINAL PAIN OTHER SPECIFIED SITE Qualifier: Diagnosis of  By: Janit Bern    . Acute sprain or strain of cervical region 04/10/2012  . ALLERGIC RHINITIS 06/26/2007   Qualifier: Diagnosis of  By: Charlsie Quest RMA, Lucy    . ANKYLOSING SPONDYLITIS 03/31/2008   Qualifier: Diagnosis of  By: Alwyn Ren MD, Chrissie Noa    . Ankylosing spondylitis of  multiple sites in spine (HCC)   . Arthritis   . Bariatric surgery status 01/14/2008   Qualifier: Diagnosis of  By: Janit Bern    . Blood in stool 12/02/2008   Qualifier: Diagnosis of  By: Janit Bern    . Calf pain 10/13/2012  . CHEST PAIN UNSPECIFIED 10/11/2009   Qualifier: Diagnosis of  By: Arlyce Dice MD, Barbette Hair   . CONSTIPATION 04/06/2010   Qualifier: Diagnosis of  By: Janit Bern    . CORNEAL ABRASION, LEFT 01/22/2010   Qualifier: Diagnosis of  By: Janit Bern    . Gastritis   . GERD 06/26/2007   Qualifier: Diagnosis of  By: Charlsie Quest RMA, Lucy    . GERD (gastroesophageal reflux disease)   . Hiatal hernia   . Hypertension   . Hypoglycemia, unspecified 01/30/2009   Qualifier: Diagnosis of  By: Janit Bern    . IBS 06/26/2007   Qualifier: Diagnosis of  By: Charlsie Quest RMA, Lucy    . LEG CRAMPS, NOCTURNAL 11/14/2010   Qualifier: Diagnosis of  By: Floydene Flock    . Long-term current use of steroids 04/20/2013  . LYMPHADENOPATHY 06/10/2007   Qualifier: Diagnosis of  By: Janit Bern    . MORBID OBESITY 06/26/2007   Qualifier: Diagnosis of  By: Charlsie Quest RMA, Lucy    . MUSCLE PAIN 03/31/2008   Qualifier: Diagnosis of  By: Alwyn Ren MD, Chrissie Noa    . Noninfectious gastroenteritis and colitis 03/17/2013  . Obesity (BMI 30-39.9) 03/16/2014  . OSA (obstructive sleep apnea) 03/08/2016  . 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 Seasonal allergies 05/14/2015  . THYROID CYST 06/26/2007   Qualifier: Diagnosis of  By: Charlsie Quest RMA, Lucy    . Thyroid nodule   . THYROID NODULE, HX OF 06/10/2007   Qualifier: Diagnosis of  By: Janit Bern      PAST SURGICAL HISTORY: Past Surgical History:  Procedure Laterality Date  . BARIATRIC SURGERY    . BREAST REDUCTION SURGERY  1996  . ESOPHAGOGASTRODUODENOSCOPY    . pelvis replacement  2010  . TOTAL HIP ARTHROPLASTY     x 2 left, one revision    SOCIAL HISTORY: Social History    Tobacco Use  . Smoking status: Never Smoker  . Smokeless tobacco: Never Used  Substance Use Topics  . Alcohol use: No    Alcohol/week: 0.0 standard drinks  . Drug use: No    FAMILY HISTORY: Family History  Problem Relation Age of Onset  . Hypertension Mother   . High Cholesterol Mother   . Arthritis Father   . Hypertension Father   . Diabetes Father   . High Cholesterol Father   .  Melanoma Maternal Grandmother   . Breast cancer Maternal Grandmother        and Aunt  . Hypertension Sister   . Diabetes Sister     ROS: Review of Systems  Constitutional: Negative for malaise/fatigue and weight loss.  Gastrointestinal: Negative for nausea and vomiting.  Musculoskeletal:       Negative for muscle weakness  Endo/Heme/Allergies:       Negative for polyphasia  Psychiatric/Behavioral: Positive for depression. Negative for suicidal ideas.       Negative for homicidal ideations    PHYSICAL EXAM: Blood pressure 131/82, pulse 89, temperature 98.1 F (36.7 C), temperature source Oral, height 5\' 2"  (1.575 m), weight 197 lb (89.4 kg), SpO2 98 %. Body mass index is 36.03 kg/m. Physical Exam  Constitutional: She is oriented to person, place, and time. She appears well-developed and well-nourished.  Cardiovascular: Normal rate.  Pulmonary/Chest: Effort normal.  Musculoskeletal: Normal range of motion.  Neurological: She is alert and oriented to person, place, and time.  Skin: Skin is warm and dry.  Psychiatric: She has a normal mood and affect. Her behavior is normal.  Vitals reviewed.   RECENT LABS AND TESTS: BMET    Component Value Date/Time   NA 140 07/30/2018 1412   NA 141 02/18/2018 1137   K 4.5 07/30/2018 1412   CL 105 07/30/2018 1412   CO2 27 07/30/2018 1412   GLUCOSE 76 07/30/2018 1412   BUN 14 07/30/2018 1412   BUN 8 02/18/2018 1137   CREATININE 0.67 07/30/2018 1412   CREATININE 0.55 11/17/2017 1025   CALCIUM 8.9 07/30/2018 1412   CALCIUM 9.1 02/04/2007  2305   GFRNONAA 99 02/18/2018 1137   GFRNONAA 103 11/17/2017 1025   GFRAA 114 02/18/2018 1137   GFRAA 119 11/17/2017 1025   Lab Results  Component Value Date   HGBA1C 5.3 02/18/2018   HGBA1C 5.5 06/11/2016   HGBA1C 5.5 04/20/2013   Lab Results  Component Value Date   INSULIN 3.0 02/18/2018   CBC    Component Value Date/Time   WBC 4.5 07/30/2018 1412   RBC 4.13 07/30/2018 1412   HGB 13.6 07/30/2018 1412   HGB 13.9 02/18/2018 1137   HGB 13.3 11/17/2006 1545   HCT 40.3 07/30/2018 1412   HCT 43.0 02/18/2018 1137   HCT 39.1 11/17/2006 1545   PLT 317.0 07/30/2018 1412   PLT 256 11/17/2006 1545   MCV 97.5 07/30/2018 1412   MCV 98 (H) 02/18/2018 1137   MCV 95.5 11/17/2006 1545   MCH 31.8 02/18/2018 1137   MCH 32.0 11/17/2017 1025   MCHC 33.9 07/30/2018 1412   RDW 12.9 07/30/2018 1412   RDW 13.7 02/18/2018 1137   RDW 12.9 11/17/2006 1545   LYMPHSABS 2.1 07/30/2018 1412   LYMPHSABS 2.6 02/18/2018 1137   LYMPHSABS 1.9 11/17/2006 1545   MONOABS 0.4 07/30/2018 1412   MONOABS 0.5 11/17/2006 1545   EOSABS 0.1 07/30/2018 1412   EOSABS 0.1 02/18/2018 1137   BASOSABS 0.0 07/30/2018 1412   BASOSABS 0.0 02/18/2018 1137   BASOSABS 0.0 11/17/2006 1545   Iron/TIBC/Ferritin/ %Sat    Component Value Date/Time   IRON 120 06/27/2010 0902   FERRITIN 42.7 06/27/2010 0902   IRONPCTSAT 26.1 06/27/2010 0902   Lipid Panel     Component Value Date/Time   CHOL 151 07/30/2018 1412   CHOL 162 02/18/2018 1137   TRIG 70.0 07/30/2018 1412   HDL 77.10 07/30/2018 1412   HDL 87 02/18/2018 1137   CHOLHDL 2 07/30/2018  1412   VLDL 14.0 07/30/2018 1412   LDLCALC 60 07/30/2018 1412   LDLCALC 63 02/18/2018 1137   Hepatic Function Panel     Component Value Date/Time   PROT 6.8 07/30/2018 1412   PROT 7.0 02/18/2018 1137   ALBUMIN 4.0 07/30/2018 1412   ALBUMIN 4.1 02/18/2018 1137   AST 18 07/30/2018 1412   ALT 15 07/30/2018 1412   ALKPHOS 79 07/30/2018 1412   BILITOT 0.4 07/30/2018 1412    BILITOT 0.7 02/18/2018 1137   BILIDIR 0.2 05/11/2015 1136      Component Value Date/Time   TSH 1.43 07/30/2018 1412   TSH 1.460 02/18/2018 1137   TSH 1.63 05/11/2015 1136   Results for ADDILYN, SATTERWHITE (MRN [id number]) as of 08/03/2018 14:33  Ref. Range 02/18/2018 11:37  Vitamin D, 25-Hydroxy Latest Ref Range: 30.0 - 100.0 ng/mL 19.8 (L)   ASSESSMENT AND PLAN: Vitamin D deficiency  Other depression - with emotional eating - Plan: buPROPion (WELLBUTRIN SR) 150 MG 12 hr tablet  Class 2 severe obesity with serious comorbidity and body mass index (BMI) of 36.0 to 36.9 in adult, unspecified obesity type (HCC)  PLAN: Vitamin D Deficiency Ravenne was informed that low vitamin D levels contributes to fatigue and are associated with obesity, breast, and colon cancer. She agrees to discontinue taking prescription Vit D @50 ,000 IU every week and will start OTC Vitamin D3 2000 IU daily. She will follow up for routine testing of vitamin D, at least 2-3 times per year. She was informed of the risk of over-replacement of vitamin D and agrees to not increase her dose unless she discusses this with [place] first. Emrys agrees to follow up   Depression with Emotional Eating Behaviors We discussed behavior modification techniques today to help Berneita deal with her emotional eating and depression. She has agreed to start taking Wellbutrin SR 150 mg qd and will schedule an appointment to see Dr Dewaine Conger. Jaycie agrees to follow up with our office in 2 weeks.  Obesity Quatisha is currently in the action stage of change. As such, her goal is to continue with weight loss efforts She has agreed to keep a food journal with 1100-1200 calories and 85 protein We also contracted that she would journal at least 5 days per week since she has not been journaling consistently.  Chana has been instructed to work up to a goal of 150 minutes of combined cardio and strengthening exercise per week for weight loss and overall  health benefits. We discussed the following Behavioral Modification Stratagies today: decrease eating out, planning for success and emotional eating strategies  Ivis has agreed to follow up with our clinic in 2 weeks. She was informed of the importance of frequent follow up visits to maximize her success with intensive lifestyle modifications for her multiple health conditions.   OBESITY BEHAVIORAL INTERVENTION VISIT  Today's visit was # 7   Starting weight: 196 lbs Starting date: 02/18/2018  Today's weight : Weight: 197 lb (89.4 kg)  Today's date: 08/03/2018 Total lbs lost to date: 0 lbs At least 15 minutes were spent on discussing the following behavioral intervention visit.    ASK: We discussed the diagnosis of obesity with [name] today and Denece agreed to give [place] permission to discuss obesity behavioral modification therapy today.  ASSESS: Laree has the diagnosis of obesity and her BMI today is 36.02 Shery is in the action stage of change   ADVISE: Dempsey was educated on the multiple health risks  of obesity as well as the benefit of weight loss to improve her health. She was advised of the need for long term treatment and the importance of lifestyle modifications to improve her current health and to decrease her risk of future health problems.  AGREE: Multiple dietary modification options and treatment options were discussed and  Mamta agreed to follow the recommendations documented in the above note.  ARRANGE: Kasidy was educated on the importance of frequent visits to treat obesity as outlined per CMS and USPSTF guidelines and agreed to schedule her next follow up appointment today.  Raelene Bott, am acting as Energy manager for [employer], FNP.  I have reviewed the above documentation for accuracy and completeness, and I agree with the above.  -  , FNP-C.

## 2018-08-04 ENCOUNTER — Encounter (INDEPENDENT_AMBULATORY_CARE_PROVIDER_SITE_OTHER): Payer: Self-pay | Admitting: Family Medicine

## 2018-08-11 ENCOUNTER — Encounter: Payer: Self-pay | Admitting: *Deleted

## 2018-08-11 DIAGNOSIS — E559 Vitamin D deficiency, unspecified: Secondary | ICD-10-CM | POA: Insufficient documentation

## 2018-08-17 ENCOUNTER — Ambulatory Visit (INDEPENDENT_AMBULATORY_CARE_PROVIDER_SITE_OTHER): Payer: Self-pay | Admitting: Family Medicine

## 2018-08-17 ENCOUNTER — Encounter (INDEPENDENT_AMBULATORY_CARE_PROVIDER_SITE_OTHER): Payer: Self-pay

## 2018-08-17 ENCOUNTER — Ambulatory Visit (INDEPENDENT_AMBULATORY_CARE_PROVIDER_SITE_OTHER): Payer: Self-pay | Admitting: Psychology

## 2018-08-20 ENCOUNTER — Encounter: Payer: Self-pay | Admitting: Gastroenterology

## 2018-08-20 ENCOUNTER — Ambulatory Visit: Payer: Medicare Other | Admitting: Gastroenterology

## 2018-08-20 VITALS — BP 142/82 | HR 76 | Ht 62.0 in | Wt 200.0 lb

## 2018-08-20 DIAGNOSIS — K219 Gastro-esophageal reflux disease without esophagitis: Secondary | ICD-10-CM | POA: Diagnosis not present

## 2018-08-20 DIAGNOSIS — Z9884 Bariatric surgery status: Secondary | ICD-10-CM

## 2018-08-20 DIAGNOSIS — R0789 Other chest pain: Secondary | ICD-10-CM

## 2018-08-20 NOTE — Progress Notes (Signed)
HPI :  62 year old female here for follow-up visit. I last saw her in January 2017. She has a history of ankylosing spondylitis and GERD. She is status post Roux-en-Y gastric bypass.  She is here for reassessment of chest discomfort. She has previously been seen by cardiology for the symptom and had a nuclear stress test in February of this year which was considered low risk study. She feels a fullness in her lower to mid chest at times, bothers her periodically. Sometimes this can radiate into her left arm. She denies any obvious pyrosis or regurgitation. No dysphagia, no postprandial abdominal pain. She denies any exertional component with this. Previously she felt this every day, now it has been much less frequent since starting back on Protonix 40 mg once daily. She was previously on Nexium for a period of time, was concerned about potential side effects and then had mostly stopped and using it just as needed. She's been on Protonix for the past month and reports her symptoms are much less frequent and overall she is doing well. She is concerned about that she's had these symptoms for a long time and any damage to her esophagus and risk for Barrett's esophagus. Her last endoscopy was in 2006 which showed a 3 cm hiatal hernia and otherwise no problems with her esophagus.  Endoscopic history Colonoscopy 2011 was normal.  Endoscopy 2006 demonstrated a 3 cm hiatal hernia and chronic gastritis.    Past Medical History:  Diagnosis Date  . Abdominal pain, other specified site 03/31/2008   Centricity Description: FLANK PAIN, RIGHT Qualifier: Diagnosis of  By: Alwyn RenHopper MD, Chrissie NoaWilliam   Centricity Description: ABDOMINAL PAIN OTHER SPECIFIED SITE Qualifier: Diagnosis of  By: Janit BernLowne DO, Yvonne    . Acute sprain or strain of cervical region 04/10/2012  . ALLERGIC RHINITIS 06/26/2007   Qualifier: Diagnosis of  By: Charlsie QuestBrand RMA, Lucy    . ANKYLOSING SPONDYLITIS 03/31/2008   Qualifier: Diagnosis of  By: Alwyn RenHopper MD,  Chrissie NoaWilliam    . Ankylosing spondylitis of multiple sites in spine (HCC)   . Arthritis   . Bariatric surgery status 01/14/2008   Qualifier: Diagnosis of  By: Janit BernLowne DO, Yvonne    . Blood in stool 12/02/2008   Qualifier: Diagnosis of  By: Janit BernLowne DO, Yvonne    . Calf pain 10/13/2012  . CHEST PAIN UNSPECIFIED 10/11/2009   Qualifier: Diagnosis of  By: Arlyce DiceKaplan MD, Barbette Hairobert D   . CONSTIPATION 04/06/2010   Qualifier: Diagnosis of  By: Janit BernLowne DO, Yvonne    . CORNEAL ABRASION, LEFT 01/22/2010   Qualifier: Diagnosis of  By: Janit BernLowne DO, Yvonne    . Gastritis   . GERD 06/26/2007   Qualifier: Diagnosis of  By: Charlsie QuestBrand RMA, Lucy    . GERD (gastroesophageal reflux disease)   . Hiatal hernia   . Hypertension   . Hypoglycemia, unspecified 01/30/2009   Qualifier: Diagnosis of  By: Janit BernLowne DO, Yvonne    . IBS 06/26/2007   Qualifier: Diagnosis of  By: Charlsie QuestBrand RMA, Lucy    . LEG CRAMPS, NOCTURNAL 11/14/2010   Qualifier: Diagnosis of  By: Floydene FlockHoops, Regina    . Long-term current use of steroids 04/20/2013  . LYMPHADENOPATHY 06/10/2007   Qualifier: Diagnosis of  By: Janit BernLowne DO, Yvonne    . MORBID OBESITY 06/26/2007   Qualifier: Diagnosis of  By: Charlsie QuestBrand RMA, Lucy    . MUSCLE PAIN 03/31/2008   Qualifier: Diagnosis of  By: Alwyn RenHopper MD, Chrissie NoaWilliam    . Noninfectious gastroenteritis  and colitis 03/17/2013  . Obesity (BMI 30-39.9) 03/16/2014  . OSA (obstructive sleep apnea) 03/08/2016  . 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 Seasonal allergies 05/14/2015  . THYROID CYST 06/26/2007   Qualifier: Diagnosis of  By: Charlsie Quest RMA, Lucy    . Thyroid nodule   . THYROID NODULE, HX OF 06/10/2007   Qualifier: Diagnosis of  By: Janit Bern       Past Surgical History:  Procedure Laterality Date  . BARIATRIC SURGERY    . BREAST REDUCTION SURGERY  1996  . ESOPHAGOGASTRODUODENOSCOPY    . pelvis replacement  2010  . TOTAL HIP ARTHROPLASTY     x 2 left, one revision   Family History    Problem Relation Age of Onset  . Hypertension Mother   . High Cholesterol Mother   . Arthritis Father   . Hypertension Father   . Diabetes Father   . High Cholesterol Father   . Melanoma Maternal Grandmother   . Breast cancer Maternal Grandmother        and Aunt  . Hypertension Sister   . Diabetes Sister    Social History   Tobacco Use  . Smoking status: Never Smoker  . Smokeless tobacco: Never Used  Substance Use Topics  . Alcohol use: No    Alcohol/week: 0.0 standard drinks  . Drug use: No   Current Outpatient Medications  Medication Sig Dispense Refill  . b complex vitamins capsule Take 1 capsule by mouth daily.    . ferrous sulfate 325 (65 FE) MG tablet Take 325 mg by mouth daily with breakfast.    . multivitamin-iron-minerals-folic acid (CENTRUM) chewable tablet Chew 1 tablet by mouth daily.    . pantoprazole (PROTONIX) 40 MG tablet Take 1 tablet (40 mg total) by mouth daily. 30 tablet 3  . predniSONE (DELTASONE) 5 MG tablet Take 5 mg by mouth daily with breakfast.     No current facility-administered medications for this visit.    Allergies  Allergen Reactions  . Aspirin     REACTION: intolerance-gi upset  . Codeine   . Morphine   . Sulfonamide Derivatives      Review of Systems: All systems reviewed and negative except where noted in HPI.   Lab Results  Component Value Date   WBC 4.5 07/30/2018   HGB 13.6 07/30/2018   HCT 40.3 07/30/2018   MCV 97.5 07/30/2018   PLT 317.0 07/30/2018    Lab Results  Component Value Date   CREATININE 0.67 07/30/2018   BUN 14 07/30/2018   NA 140 07/30/2018   K 4.5 07/30/2018   CL 105 07/30/2018   CO2 27 07/30/2018    Lab Results  Component Value Date   ALT 15 07/30/2018   AST 18 07/30/2018   ALKPHOS 79 07/30/2018   BILITOT 0.4 07/30/2018     Physical Exam: BP (!) 142/82   Pulse 76   Ht 5\' 2"  (1.575 m)   Wt 200 lb (90.7 kg)   BMI 36.58 kg/m  Constitutional: Pleasant,well-developed, female in no  acute distress. HEENT: Normocephalic and atraumatic. Conjunctivae are normal. No scleral icterus. Neck supple.  Cardiovascular: Normal rate, regular rhythm.  Pulmonary/chest: Effort normal and breath sounds normal. No wheezing, rales or rhonchi. Abdominal: Soft, nondistended, nontender. There are no masses palpable. No hepatomegaly. Extremities: no edema Lymphadenopathy: No cervical adenopathy noted. Neurological: Alert and oriented to person place and time.  Skin: Skin is warm and dry. No rashes noted. Psychiatric: Normal mood and affect. Behavior is normal.   ASSESSMENT AND PLAN: 62 y/o female here for reassessment of the following issues:  Atypical chest pain / GERD / history of gastric bypass - she's had a few cardiac workups in recent years for this issue, most recently earlier this year with a nuclear stress test which was negative. Her symptoms appear to be reliably relieved with antacids. She had been doing okay her on Nexium previously, although she stopped using it daily and had recurrence of symptoms. Now back on pantoprazole capsules in doing better but still having some breakthrough symptoms. I do suspect that her symptoms of discomfort are likely due to reflux given her response to antacids. Given her gastric bypass status, I recommend she crack open the capsules prior to ingestion to maximize absorption. Given the duration of symptoms she's had, she's quite concerned about the risks for Barrett esophagus. I offered her an upper endoscopy to further evaluate given her symptoms do still persist to some extent. I discussed risks and benefits of endoscopy and anesthesia with her and she wanted to proceed. Hopefully she gets additional benefit by cracking open the Protonix, she can increase to twice daily if she needs to if that will help reduce her symptoms. We did discuss long-term risks and benefits of chronic PPI use at length. She will continue present dose of time until her endoscopy  is done. Long term recommended lowest dose needed to control her symptoms.  Colon cancer screening - due in 2021 for screening colonoscopy. She agreed.  Ileene Patrick, MD [facility]  CC: Zola Button, Grayling Congress, *

## 2018-08-20 NOTE — Patient Instructions (Signed)
If you are age 60 or older, your body mass index should be between 23-30. Your Body mass index is 36.58 kg/m. If this is out of the aforementioned range listed, please consider follow up with your Primary Care Provider.  If you are age 60 or younger, your body mass index should be between 19-25. Your Body mass index is 36.58 kg/m. If this is out of the aformentioned range listed, please consider follow up with your Primary Care Provider.   You have been scheduled for an endoscopy. Please follow written instructions given to you at your visit today. If you use inhalers (even only as needed), please bring them with you on the day of your procedure. Your physician has requested that you go to www.startemmi.com and enter the access code given to you at your visit today. This web site gives a general overview about your procedure. However, you should still follow specific instructions given to you by our office regarding your preparation for the procedure.   Continue your Protonix but crack the capsule before taking.  Thank you for entrusting me with your care and for choosing [facility], Dr. Ileene PatrickSteven Armbruster

## 2018-09-02 ENCOUNTER — Ambulatory Visit (AMBULATORY_SURGERY_CENTER): Payer: Medicare Other | Admitting: Gastroenterology

## 2018-09-02 ENCOUNTER — Encounter: Payer: Self-pay | Admitting: Gastroenterology

## 2018-09-02 VITALS — BP 149/89 | HR 78 | Temp 97.5°F | Resp 20 | Ht 62.0 in | Wt 200.0 lb

## 2018-09-02 DIAGNOSIS — K219 Gastro-esophageal reflux disease without esophagitis: Secondary | ICD-10-CM

## 2018-09-02 DIAGNOSIS — K449 Diaphragmatic hernia without obstruction or gangrene: Secondary | ICD-10-CM | POA: Diagnosis not present

## 2018-09-02 DIAGNOSIS — Z9884 Bariatric surgery status: Secondary | ICD-10-CM

## 2018-09-02 DIAGNOSIS — R0789 Other chest pain: Secondary | ICD-10-CM

## 2018-09-02 MED ORDER — OMEPRAZOLE 40 MG PO CPDR: 40.0000 mg | DELAYED_RELEASE_CAPSULE | Freq: Every day | ORAL | 3 refills | Status: DC

## 2018-09-02 MED ORDER — SODIUM CHLORIDE 0.9 % IV SOLN: 500.0000 mL | Freq: Once | INTRAVENOUS | Status: DC

## 2018-09-02 NOTE — Progress Notes (Signed)
PT taken to PACU. Monitors in place. VSS. Report given to RN. 

## 2018-09-02 NOTE — Patient Instructions (Addendum)
Resume previous diet and medications today.  Begin Omeprazole 40 mg by mouth daily.  Break open capsules before ingesting.    Return to normal activities tomorrow.     YOU HAD AN ENDOSCOPIC PROCEDURE TODAY AT THE [facility]:   Refer to the procedure report that was given to you for any specific questions about what was found during the examination.  If the procedure report does not answer your questions, please call your gastroenterologist to clarify.  If you requested that your care partner not be given the details of your procedure findings, then the procedure report has been included in a sealed envelope for you to review at your convenience later.  YOU SHOULD EXPECT: Some feelings of bloating in the abdomen. Passage of more gas than usual.  Walking can help get rid of the air that was put into your GI tract during the procedure and reduce the bloating. If you had a lower endoscopy (such as a colonoscopy or flexible sigmoidoscopy) you may notice spotting of blood in your stool or on the toilet paper. If you underwent a bowel prep for your procedure, you may not have a normal bowel movement for a few days.  Please Note:  You might notice some irritation and congestion in your nose or some drainage.  This is from the oxygen used during your procedure.  There is no need for concern and it should clear up in a day or so.  SYMPTOMS TO REPORT IMMEDIATELY:     Following upper endoscopy (EGD)  Vomiting of blood or coffee ground material  New chest pain or pain under the shoulder blades  Painful or persistently difficult swallowing  New shortness of breath  Fever of 100F or higher  Black, tarry-looking stools  For urgent or emergent issues, a gastroenterologist can be reached at any hour by calling [phone].   DIET:  We do recommend a small meal at first, but then you may proceed to your regular diet.  Drink plenty of fluids but you should avoid alcoholic beverages for  24 hours.  ACTIVITY:  You should plan to take it easy for the rest of today and you should NOT DRIVE or use heavy machinery until tomorrow (because of the sedation medicines used during the test).    FOLLOW UP: Our staff will call the number listed on your records the next business day following your procedure to check on you and address any questions or concerns that you may have regarding the information given to you following your procedure. If we do not reach you, we will leave a message.  However, if you are feeling well and you are not experiencing any problems, there is no need to return our call.  We will assume that you have returned to your regular daily activities without incident.  If any biopsies were taken you will be contacted by phone or by letter within the next 1-3 weeks.  Please call us at [phone] if you have not heard about the biopsies in 3 weeks.    SIGNATURES/CONFIDENTIALITY: You and/or your care partner have signed paperwork which will be entered into your electronic medical record.  These signatures attest to the fact that that the information above on your After Visit Summary has been reviewed and is understood.  Full responsibility of the confidentiality of this discharge information lies with you and/or your care-partner.

## 2018-09-03 ENCOUNTER — Telehealth: Payer: Self-pay

## 2018-09-03 ENCOUNTER — Telehealth: Payer: Self-pay | Admitting: Gastroenterology

## 2018-09-03 NOTE — Telephone Encounter (Signed)
  Follow up Call-  Call back number 09/02/2018  Post procedure Call Back phone  # [phone]  Permission to leave phone message Yes  Some recent data might be hidden     Patient questions:  Do you have a fever, pain , or abdominal swelling? No. Pain Score  0 *  Have you tolerated food without any problems? Yes.    Have you been able to return to your normal activities? Yes.    Do you have any questions about your discharge instructions: Diet   No. Medications  No. Follow up visit  No.  Do you have questions or concerns about your Care? No.  Actions: * If pain score is 4 or above: No action needed, pain <4.

## 2018-09-03 NOTE — Telephone Encounter (Signed)
Dr. Dyann RuddleVoellinger's office calling to request a copy of procedure report faxed to [phone].

## 2018-09-03 NOTE — Telephone Encounter (Signed)
Called and spoke to pt. Due to her gastric bypass surgery she would like for us  to fax the EGD report to Dr. Cameron ProudVoellinger Because Dr. Mervyn SkeetersA had to adjust one of her staples. EGD faxed to [phone]

## 2018-09-03 NOTE — Progress Notes (Signed)
Patient would like results from procedure on 09/02/18 sent to Charlie Pitteravid Voellinger MD with [facility] in [place].  His fax number is [phone].

## 2018-09-03 NOTE — Op Note (Signed)
[facility] Patient Name: [name] Procedure Date: 09/02/2018 8:41 AM MRN: [id number] Endoscopist: Viviann Spare P. Adela Lank , MD Age: 62 Referring MD:  Date of Birth: 16-Aug-1956 Gender: Female Account #: [id number] Procedure:                Upper GI endoscopy Indications:              Follow-up of esophageal reflux, atypical chest pain                            - improved on protonix, negative cardiac workup Medicines:                Monitored Anesthesia Care Procedure:                Pre-Anesthesia Assessment:                           - Prior to the procedure, a History and Physical                            was performed, and patient medications and                            allergies were reviewed. The patient's tolerance of                            previous anesthesia was also reviewed. The risks                            and benefits of the procedure and the sedation                            options and risks were discussed with the patient.                            All questions were answered, and informed consent                            was obtained. Prior Anticoagulants: The patient has                            taken no previous anticoagulant or antiplatelet                            agents. ASA Grade Assessment: III - A patient with                            severe systemic disease. After reviewing the risks                            and benefits, the patient was deemed in                            satisfactory condition to undergo the procedure.  After obtaining informed consent, the endoscope was                            passed under direct vision. Throughout the                            procedure, the patient's blood pressure, pulse, and                            oxygen saturations were monitored continuously. The                            Model [id number]-[id number]) scope was introduced       through the mouth, and advanced to the jejunum. The                            upper GI endoscopy was accomplished without                            difficulty. The patient tolerated the procedure                            well. Scope In: Scope Out: Findings:                 Esophagogastric landmarks were identified: the                            Z-line was found at 34 cm, the gastroesophageal                            junction was found at 34 cm and the upper extent of                            the gastric folds was found at 35 cm from the                            incisors.                           A 1 cm hiatal hernia was present.                           The exam of the esophagus was otherwise normal. No                            erosive changes or Barrett's                           Evidence of a Roux-en-Y gastrojejunostomy was                            found. The gastrojejunal anastomosis was  characterized by visible sutures (3 areas), one of                            which was loose and attached to a visible staple.                            The suture and visible staple was removed with a                            cold forceps. There was otherwise no ulceration or                            abnormality with the gastric pouch. Retroflexed                            views of the pouch not obtained due to small size.                           The examined small bowel limb was normal. Complications:            No immediate complications. Estimated blood loss:                            Minimal. Estimated Blood Loss:     Estimated blood loss was minimal. Impression:               - Esophagogastric landmarks identified.                           - 1 cm hiatal hernia.                           - Normal esophagus.                           - Roux-en-Y gastrojejunostomy with gastrojejunal                            anastomosis characterized by  visible suture and                            retained staple - largest one was loose and removed                            with forceps                           - Normal examined small bowel limb Recommendation:           - Patient has a contact number available for                            emergencies. The signs and symptoms of potential                            delayed complications were  discussed with the                            patient. Return to normal activities tomorrow.                            Written discharge instructions were provided to the                            patient.                           - Resume previous diet.                           - Continue present medications. Continue protonix                            if this helps controls symptoms. Would switch to                            capsule form PPI and break capsules open prior to                            ingestion if symptoms recur Viviann Spare P. Armbruster, MD 09/02/2018 9:06:04 AM This report has been signed electronically.

## 2018-11-19 ENCOUNTER — Telehealth: Payer: Self-pay

## 2018-11-19 NOTE — Telephone Encounter (Signed)
Copied from CRM [id number]. Topic: General - Other >> Nov 19, 2018 11:50 AM [name] wrote: Reason for CRM: Pt called running a fever of 100.9, ear ache,head ache, coughing with mucus and chest congestion. Due to Hip and pelvis replacement she wanted to keep infection down in body and wanted to see if something can be called in such as an anti biotic or if she should come in/ please advise

## 2018-11-19 NOTE — Telephone Encounter (Signed)
Left message on machine that she needs to come in.

## 2018-11-19 NOTE — Telephone Encounter (Signed)
She would really need to be seen

## 2018-12-01 DIAGNOSIS — Z1231 Encounter for screening mammogram for malignant neoplasm of breast: Secondary | ICD-10-CM | POA: Diagnosis not present

## 2018-12-01 DIAGNOSIS — N958 Other specified menopausal and perimenopausal disorders: Secondary | ICD-10-CM | POA: Diagnosis not present

## 2018-12-01 DIAGNOSIS — Z13820 Encounter for screening for osteoporosis: Secondary | ICD-10-CM | POA: Diagnosis not present

## 2019-01-28 DIAGNOSIS — K219 Gastro-esophageal reflux disease without esophagitis: Secondary | ICD-10-CM | POA: Diagnosis not present

## 2019-01-28 DIAGNOSIS — Z9884 Bariatric surgery status: Secondary | ICD-10-CM | POA: Diagnosis not present

## 2019-01-28 DIAGNOSIS — R1013 Epigastric pain: Secondary | ICD-10-CM | POA: Diagnosis not present

## 2019-02-02 ENCOUNTER — Ambulatory Visit: Payer: Medicare Other | Admitting: Family Medicine

## 2019-02-17 ENCOUNTER — Telehealth: Payer: Self-pay | Admitting: Gastroenterology

## 2019-02-17 MED ORDER — NEXIUM 40 MG PO CPDR: 40.0000 mg | DELAYED_RELEASE_CAPSULE | Freq: Every day | ORAL | 3 refills | Status: DC

## 2019-02-17 NOTE — Telephone Encounter (Signed)
Spoke with patient who clarified that she wanted brand name Nexium only, not generic.  She already knows to open the capsules and swallow the contents.

## 2019-02-17 NOTE — Telephone Encounter (Signed)
Ok to switch 

## 2019-02-17 NOTE — Telephone Encounter (Signed)
If she prefers esomeprazole 40mg  that is fine. Please order the capsule form in light of her gastric bypass anatomy, she should crack open capsule first and swallow contents of the capsule to maximize absorption. Thanks. #[phone], [phone]

## 2019-02-17 NOTE — Telephone Encounter (Signed)
Pt stated that omeprazole does not help her symptoms--she prefers esomeprazole.  She requested new prescription sent to [facility] on [address]

## 2019-06-04 NOTE — Progress Notes (Signed)
Virtual Visit via Video Note  I connected with patient on 06/08/19 at  8:00 AM EDT by audio enabled telemedicine application and verified that I am speaking with the correct person using two identifiers.   THIS ENCOUNTER IS A VIRTUAL VISIT DUE TO COVID-19 - PATIENT WAS NOT SEEN IN THE OFFICE. PATIENT HAS CONSENTED TO VIRTUAL VISIT / TELEMEDICINE VISIT   Location of patient: home  Location of provider: office  I discussed the limitations of evaluation and management by telemedicine and the availability of in person appointments. The patient expressed understanding and agreed to proceed.   Subjective:   [name] is a 63 y.o. female who presents for Medicare Annual (Subsequent) preventive examination.  Review of Systems:  Cardiac Risk Factors include: advanced age (>5255men, 23>65 women);sedentary lifestyle;hypertension  Home Safety/Smoke Alarms: Feels safe in home. Smoke alarms in place.  Lives with husband in 2 story home.   Female:   Pap- pt reports done w/ Dr.Lowe.       Mammo- Reports done w/ Dr.Lowe 11/2018 will do again next year.  CCS- due 2021     Objective:     Vitals:Unable to assess. This visit is enabled though telemedicine due to Covid 19.   Advanced Directives 06/08/2019 02/09/2018 03/07/2016 03/05/2016  Does Patient Have a Medical Advance Directive? No No No No  Would patient like information on creating a medical advance directive? No - Patient declined Yes (MAU/Ambulatory/Procedural Areas - Information given) No - patient declined information No - patient declined information    Tobacco Social History   Tobacco Use  Smoking Status Never Smoker  Smokeless Tobacco Never Used     Counseling given: Not Answered   Clinical Intake: Pain : No/denies pain    Past Medical History:  Diagnosis Date  . Abdominal pain, other specified site 03/31/2008   Centricity Description: FLANK PAIN, RIGHT Qualifier: Diagnosis of  By: Alwyn RenHopper MD, Chrissie NoaWilliam   Centricity  Description: ABDOMINAL PAIN OTHER SPECIFIED SITE Qualifier: Diagnosis of  By: Janit BernLowne DO, Yvonne    . Acute sprain or strain of cervical region 04/10/2012  . ALLERGIC RHINITIS 06/26/2007   Qualifier: Diagnosis of  By: Charlsie QuestBrand RMA, Lucy    . ANKYLOSING SPONDYLITIS 03/31/2008   Qualifier: Diagnosis of  By: Alwyn RenHopper MD, Chrissie NoaWilliam    . Ankylosing spondylitis of multiple sites in spine (HCC)   . Arthritis   . Bariatric surgery status 01/14/2008   Qualifier: Diagnosis of  By: Janit BernLowne DO, Yvonne    . Blood in stool 12/02/2008   Qualifier: Diagnosis of  By: Janit BernLowne DO, Yvonne    . Blood transfusion without reported diagnosis   . Calf pain 10/13/2012  . CHEST PAIN UNSPECIFIED 10/11/2009   Qualifier: Diagnosis of  By: Arlyce DiceKaplan MD, Barbette Hairobert D   . CONSTIPATION 04/06/2010   Qualifier: Diagnosis of  By: Janit BernLowne DO, Yvonne    . CORNEAL ABRASION, LEFT 01/22/2010   Qualifier: Diagnosis of  By: Janit BernLowne DO, Yvonne    . Gastritis   . GERD 06/26/2007   Qualifier: Diagnosis of  By: Charlsie QuestBrand RMA, Lucy    . GERD (gastroesophageal reflux disease)   . Hiatal hernia   . Hypertension    no medications now  . Hypoglycemia, unspecified 01/30/2009   Qualifier: Diagnosis of  By: Janit BernLowne DO, Yvonne    . IBS 06/26/2007   Qualifier: Diagnosis of  By: Charlsie QuestBrand RMA, Lucy    . LEG CRAMPS, NOCTURNAL 11/14/2010   Qualifier: Diagnosis of  By: Floydene FlockHoops, Regina    .  Long-term current use of steroids 04/20/2013  . LYMPHADENOPATHY 06/10/2007   Qualifier: Diagnosis of  By: Jerold Coombe    . MORBID OBESITY 06/26/2007   Qualifier: Diagnosis of  By: Marca Ancona RMA, Lucy    . MUSCLE PAIN 03/31/2008   Qualifier: Diagnosis of  By: Linna Darner MD, Gwyndolyn Saxon    . Noninfectious gastroenteritis and colitis 03/17/2013  . Obesity (BMI 30-39.9) 03/16/2014  . OSA (obstructive sleep apnea) 03/08/2016  . 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/23/2010   Qualifier: Diagnosis of  By: Jerold Coombe    . Seasonal allergies 05/14/2015  . Sleep apnea    no CPAP  . THYROID CYST 06/26/2007    Qualifier: Diagnosis of  By: Marca Ancona RMA, Lucy    . Thyroid nodule   . THYROID NODULE, HX OF 06/10/2007   Qualifier: Diagnosis of  By: Jerold Coombe     Past Surgical History:  Procedure Laterality Date  . BARIATRIC SURGERY    . BREAST REDUCTION SURGERY  1996  . COLONOSCOPY    . ESOPHAGOGASTRODUODENOSCOPY    . pelvis replacement  2010  . TOTAL HIP ARTHROPLASTY     x 2 left, one revision  . UPPER GASTROINTESTINAL ENDOSCOPY     Family History  Problem Relation Age of Onset  . Hypertension Mother   . High Cholesterol Mother   . Arthritis Father   . Hypertension Father   . Diabetes Father   . High Cholesterol Father   . Melanoma Maternal Grandmother   . Breast cancer Maternal Grandmother        and Aunt  . Hypertension Sister   . Diabetes Sister   . Colon cancer Neg Hx   . Esophageal cancer Neg Hx   . Rectal cancer Neg Hx   . Stomach cancer Neg Hx    Social History   Socioeconomic History  . Marital status: Married    Spouse name: Aroura Vasudevan  . Number of children: Not on file  . Years of education: Not on file  . Highest education level: Not on file  Occupational History  . Occupation: retired  Scientific laboratory technician  . Financial resource strain: Not on file  . Food insecurity    Worry: Not on file    Inability: Not on file  . Transportation needs    Medical: Not on file    Non-medical: Not on file  Tobacco Use  . Smoking status: Never Smoker  . Smokeless tobacco: Never Used  Substance and Sexual Activity  . Alcohol use: No    Alcohol/week: 0.0 standard drinks  . Drug use: No  . Sexual activity: Yes  Lifestyle  . Physical activity    Days per week: Not on file    Minutes per session: Not on file  . Stress: Not on file  Relationships  . Social Herbalist on phone: Not on file    Gets together: Not on file    Attends religious service: Not on file    Active member of club or organization: Not on file    Attends meetings of clubs or organizations: Not  on file    Relationship status: Not on file  Other Topics Concern  . Not on file  Social History Narrative   Occupation: Production manager   Daily Caffeine use- 1 cup daily       Outpatient Encounter Medications as of 06/08/2019  Medication Sig  . b complex vitamins capsule Take 1 capsule by  mouth daily.  . ferrous sulfate 325 (65 FE) MG tablet Take 325 mg by mouth daily with breakfast.  . multivitamin-iron-minerals-folic acid (CENTRUM) chewable tablet Chew 1 tablet by mouth daily.  Marland Kitchen NEXIUM 40 MG capsule Take 1 capsule (40 mg total) by mouth daily at 12 noon.  . predniSONE (DELTASONE) 5 MG tablet Take 5 mg by mouth daily with breakfast.  . Thiamine HCl (B-1) 100 MG TABS Take 100 mg by mouth daily.   No facility-administered encounter medications on file as of 06/08/2019.     Activities of Daily Living In your present state of health, do you have any difficulty performing the following activities: 06/08/2019  Hearing? N  Vision? N  Difficulty concentrating or making decisions? N  Walking or climbing stairs? N  Dressing or bathing? N  Doing errands, shopping? N  Preparing Food and eating ? N  Using the Toilet? N  In the past six months, have you accidently leaked urine? N  Do you have problems with loss of bowel control? N  Managing your Medications? N  Managing your Finances? N  Housekeeping or managing your Housekeeping? N  Some recent data might be hidden    Patient Care Team: Zola Button, Grayling Congress, DO as PCP - General Candice Camp, MD as Consulting Physician (Obstetrics and Gynecology) Cameron Proud, Onalee Hua, MD as Referring Physician (Surgery) Coralyn Helling, MD as Consulting Physician (Pulmonary Disease) Pollyann Savoy, MD as Consulting Physician (Rheumatology) Jake Bathe, MD as Consulting Physician (Cardiology)    Assessment:   This is a routine wellness examination for [place]. Physical assessment deferred to PCP.  Exercise Activities and Dietary recommendations  Current Exercise Habits: The patient does not participate in regular exercise at present, Exercise limited by: None identified Diet (meal preparation, eat out, water intake, caffeinated beverages, dairy products, fruits and vegetables): well balanced, on average, 3 meals per day  Goals    . Drink at least 6 glasses of water per day.    . Increase physical activity     Work out 3x/ week    . Weight (lb) < 150 lb (68 kg)       Fall Risk Fall Risk  06/08/2019 02/09/2018 03/05/2016  Falls in the past year? 1 Yes Yes  Comment - - Fell down steps and tripped over raised area on the road.   Number falls in past yr: 1 2 or more 2 or more  Injury with Fall? 0 No No  Risk for fall due to : (No Data) History of fall(s) Impaired balance/gait;History of fall(s)  Risk for fall due to: Comment slipped x2 - -  Follow up Education provided;Falls prevention discussed Education provided;Falls prevention discussed Education provided;Falls prevention discussed    Depression Screen PHQ 2/9 Scores 06/08/2019 02/18/2018 02/09/2018 06/11/2016  PHQ - 2 Score 0 1 0 0  PHQ- 9 Score - 8 - -  Exception Documentation - Medical reason - -     Cognitive Function:     MMSE - Mini Mental State Exam 03/05/2016  Orientation to time 5  Orientation to Place 5  Registration 3  Attention/ Calculation 5  Recall 3  Language- name 2 objects 2  Language- repeat 1  Language- follow 3 step command 3  Language- read & follow direction 1  Write a sentence 1  Copy design 1  Total score 30     6CIT Screen 06/08/2019  What Year? 0 points  What month? 0 points  What time? 0 points  Count back  from 20 0 points  Months in reverse 0 points  Repeat phrase 4 points  Total Score 4    Immunization History  Administered Date(s) Administered  . Influenza Whole 06/22/2010  . Tdap 03/16/2014    Screening Tests Health Maintenance  Topic Date Due  . MAMMOGRAM  08/12/2017  . PAP SMEAR-Modifier  08/12/2018  . INFLUENZA  VACCINE  05/01/2019  . COLONOSCOPY  10/25/2019  . TETANUS/TDAP  03/16/2024  . Hepatitis C Screening  Completed  . HIV Screening  Completed      Plan:    See you next year!  Continue to eat heart healthy diet (full of fruits, vegetables, whole grains, lean protein, water--limit salt, fat, and sugar intake) and increase physical activity as tolerated.  Continue doing brain stimulating activities (puzzles, reading, adult coloring books, staying active) to keep memory sharp.      I have personally reviewed and noted the following in the patient's chart:   . Medical and social history . Use of alcohol, tobacco or illicit drugs  . Current medications and supplements . Functional ability and status . Nutritional status . Physical activity . Advanced directives . List of other physicians . Hospitalizations, surgeries, and ER visits in previous 12 months . Vitals . Screenings to include cognitive, depression, and falls . Referrals and appointments  In addition, I have reviewed and discussed with patient certain preventive protocols, quality metrics, and best practice recommendations. A written personalized care plan for preventive services as well as general preventive health recommendations were provided to patient.     Avon GullyBritt, Jadrien Narine Angel, [place]  06/08/2019

## 2019-06-08 ENCOUNTER — Ambulatory Visit (INDEPENDENT_AMBULATORY_CARE_PROVIDER_SITE_OTHER): Payer: Medicare Other | Admitting: *Deleted

## 2019-06-08 ENCOUNTER — Other Ambulatory Visit: Payer: Self-pay

## 2019-06-08 ENCOUNTER — Encounter: Payer: Self-pay | Admitting: *Deleted

## 2019-06-08 DIAGNOSIS — Z0000 Encounter for general adult medical examination without abnormal findings: Secondary | ICD-10-CM | POA: Diagnosis not present

## 2019-06-08 NOTE — Patient Instructions (Signed)
See you next year!  Continue to eat heart healthy diet (full of fruits, vegetables, whole grains, lean protein, water--limit salt, fat, and sugar intake) and increase physical activity as tolerated.  Continue doing brain stimulating activities (puzzles, reading, adult coloring books, staying active) to keep memory sharp.     [name] , Thank you for taking time to come for your Medicare Wellness Visit. I appreciate your ongoing commitment to your health goals. Please review the following plan we discussed and let me know if I can assist you in the future.   These are the goals we discussed: Goals    . Drink at least 6 glasses of water per day.    . Increase physical activity     Work out 3x/ week    . Weight (lb) < 150 lb (68 kg)       This is a list of the screening recommended for you and due dates:  Health Maintenance  Topic Date Due  . Mammogram  08/12/2017  . Pap Smear  08/12/2018  . Flu Shot  05/01/2019  . Colon Cancer Screening  10/25/2019  . Tetanus Vaccine  03/16/2024  .  Hepatitis C: One time screening is recommended by Center for Disease Control  (CDC) for  adults born from [birth year] through 1963.   Completed  . HIV Screening  Completed    Health Maintenance After Age 61 After age 61, you are at a higher risk for certain long-term diseases and infections as well as injuries from falls. Falls are a major cause of broken bones and head injuries in people who are older than age 61. Getting regular preventive care can help to keep you healthy and well. Preventive care includes getting regular testing and making lifestyle changes as recommended by your health care provider. Talk with your health care provider about:  Which screenings and tests you should have. A screening is a test that checks for a disease when you have no symptoms.  A diet and exercise plan that is right for you. What should I know about screenings and tests to prevent falls? Screening and testing are the  best ways to find a health problem early. Early diagnosis and treatment give you the best chance of managing medical conditions that are common after age 61. Certain conditions and lifestyle choices may make you more likely to have a fall. Your health care provider may recommend:  Regular vision checks. Poor vision and conditions such as cataracts can make you more likely to have a fall. If you wear glasses, make sure to get your prescription updated if your vision changes.  Medicine review. Work with your health care provider to regularly review all of the medicines you are taking, including over-the-counter medicines. Ask your health care provider about any side effects that may make you more likely to have a fall. Tell your health care provider if any medicines that you take make you feel dizzy or sleepy.  Osteoporosis screening. Osteoporosis is a condition that causes the bones to get weaker. This can make the bones weak and cause them to break more easily.  Blood pressure screening. Blood pressure changes and medicines to control blood pressure can make you feel dizzy.  Strength and balance checks. Your health care provider may recommend certain tests to check your strength and balance while standing, walking, or changing positions.  Foot health exam. Foot pain and numbness, as well as not wearing proper footwear, can make you more likely to have  a fall.  Depression screening. You may be more likely to have a fall if you have a fear of falling, feel emotionally low, or feel unable to do activities that you used to do.  Alcohol use screening. Using too much alcohol can affect your balance and may make you more likely to have a fall. What actions can I take to lower my risk of falls? General instructions  Talk with your health care provider about your risks for falling. Tell your health care provider if: ? You fall. Be sure to tell your health care provider about all falls, even ones that  seem minor. ? You feel dizzy, sleepy, or off-balance.  Take over-the-counter and prescription medicines only as told by your health care provider. These include any supplements.  Eat a healthy diet and maintain a healthy weight. A healthy diet includes low-fat dairy products, low-fat (lean) meats, and fiber from whole grains, beans, and lots of fruits and vegetables. Home safety  Remove any tripping hazards, such as rugs, cords, and clutter.  Install safety equipment such as grab bars in bathrooms and safety rails on stairs.  Keep rooms and walkways well-lit. Activity   Follow a regular exercise program to stay fit. This will help you maintain your balance. Ask your health care provider what types of exercise are appropriate for you.  If you need a cane or walker, use it as recommended by your health care provider.  Wear supportive shoes that have nonskid soles. Lifestyle  Do not drink alcohol if your health care provider tells you not to drink.  If you drink alcohol, limit how much you have: ? 0-1 drink a day for women. ? 0-2 drinks a day for men.  Be aware of how much alcohol is in your drink. In the [place] one drink equals one typical bottle of beer (12 oz), one-half glass of wine (5 oz), or one shot of hard liquor (1 oz).  Do not use any products that contain nicotine or tobacco, such as cigarettes and e-cigarettes. If you need help quitting, ask your health care provider. Summary  Having a healthy lifestyle and getting preventive care can help to protect your health and wellness after age 61.  Screening and testing are the best way to find a health problem early and help you avoid having a fall. Early diagnosis and treatment give you the best chance for managing medical conditions that are more common for people who are older than age 4.  Falls are a major cause of broken bones and head injuries in people who are older than age 55. Take precautions to prevent a fall at  home.  Work with your health care provider to learn what changes you can make to improve your health and wellness and to prevent falls. This information is not intended to replace advice given to you by your health care provider. Make sure you discuss any questions you have with your health care provider. Document Released: 07/30/2017 Document Revised: 01/07/2019 Document Reviewed: 07/30/2017 Elsevier Patient Education  2020 [employer].

## 2019-06-15 DIAGNOSIS — N951 Menopausal and female climacteric states: Secondary | ICD-10-CM | POA: Diagnosis not present

## 2019-07-05 DIAGNOSIS — L739 Follicular disorder, unspecified: Secondary | ICD-10-CM | POA: Diagnosis not present

## 2019-07-16 ENCOUNTER — Ambulatory Visit: Payer: Medicare Other | Admitting: Medical

## 2019-07-16 ENCOUNTER — Ambulatory Visit (INDEPENDENT_AMBULATORY_CARE_PROVIDER_SITE_OTHER): Payer: Medicare Other | Admitting: Family Medicine

## 2019-07-16 ENCOUNTER — Other Ambulatory Visit: Payer: Self-pay

## 2019-07-16 ENCOUNTER — Encounter: Payer: Self-pay | Admitting: Family Medicine

## 2019-07-16 VITALS — BP 140/80 | HR 89 | Temp 95.9°F | Ht 62.0 in | Wt 209.5 lb

## 2019-07-16 DIAGNOSIS — L0291 Cutaneous abscess, unspecified: Secondary | ICD-10-CM | POA: Diagnosis not present

## 2019-07-16 NOTE — Progress Notes (Signed)
Chief Complaint  Patient presents with  . Recurrent Skin Infections    [name] is a 63 y.o. female here for a skin complaint.  Duration: 4 days Location: inner R thigh Pruritic? No Painful? Yes Drainage? No New soaps/lotions/topicals/detergents? No Other associated symptoms: redness Therapies tried thus far: compresses, alcohol, TAO, antibiotic (maybe Keflex?)  ROS:  Const: No fevers Skin: As noted in HPI  Past Medical History:  Diagnosis Date  . Abdominal pain, other specified site 03/31/2008   Centricity Description: FLANK PAIN, RIGHT Qualifier: Diagnosis of  By: Alwyn Ren MD, Chrissie Noa   Centricity Description: ABDOMINAL PAIN OTHER SPECIFIED SITE Qualifier: Diagnosis of  By: Janit Bern    . Acute sprain or strain of cervical region 04/10/2012  . ALLERGIC RHINITIS 06/26/2007   Qualifier: Diagnosis of  By: Charlsie Quest RMA, Lucy    . ANKYLOSING SPONDYLITIS 03/31/2008   Qualifier: Diagnosis of  By: Alwyn Ren MD, Chrissie Noa    . Ankylosing spondylitis of multiple sites in spine (HCC)   . Arthritis   . Bariatric surgery status 01/14/2008   Qualifier: Diagnosis of  By: Janit Bern    . Blood in stool 12/02/2008   Qualifier: Diagnosis of  By: Janit Bern    . Blood transfusion without reported diagnosis   . Calf pain 10/13/2012  . CHEST PAIN UNSPECIFIED 10/11/2009   Qualifier: Diagnosis of  By: Arlyce Dice MD, Barbette Hair   . CONSTIPATION 04/06/2010   Qualifier: Diagnosis of  By: Janit Bern    . CORNEAL ABRASION, LEFT 01/22/2010   Qualifier: Diagnosis of  By: Janit Bern    . Gastritis   . GERD 06/26/2007   Qualifier: Diagnosis of  By: Charlsie Quest RMA, Lucy    . GERD (gastroesophageal reflux disease)   . Hiatal hernia   . Hypertension    no medications now  . Hypoglycemia, unspecified 01/30/2009   Qualifier: Diagnosis of  By: Janit Bern    . IBS 06/26/2007   Qualifier: Diagnosis of  By: Charlsie Quest RMA, Lucy    . LEG CRAMPS, NOCTURNAL 11/14/2010   Qualifier: Diagnosis of  By:  Floydene Flock    . Long-term current use of steroids 04/20/2013  . LYMPHADENOPATHY 06/10/2007   Qualifier: Diagnosis of  By: Janit Bern    . MORBID OBESITY 06/26/2007   Qualifier: Diagnosis of  By: Charlsie Quest RMA, Lucy    . MUSCLE PAIN 03/31/2008   Qualifier: Diagnosis of  By: Alwyn Ren MD, Chrissie Noa    . Noninfectious gastroenteritis and colitis 03/17/2013  . Obesity (BMI 30-39.9) 03/16/2014  . OSA (obstructive sleep apnea) 03/08/2016  . 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 Seasonal allergies 05/14/2015  . Sleep apnea    no CPAP  . THYROID CYST 06/26/2007   Qualifier: Diagnosis of  By: Charlsie Quest RMA, Lucy    . Thyroid nodule   . THYROID NODULE, HX OF 06/10/2007   Qualifier: Diagnosis of  By: Laury Axon DO, Yvonne      BP 140/80 (BP Location: Left Arm, Patient Position: Sitting, Cuff Size: Large)   Pulse 89   Temp (!) 95.9 F (35.5 C) (Temporal)   Ht 5\' 2"  (1.575 m)   Wt 209 lb 8 oz (95 kg)   SpO2 97%   BMI 38.32 kg/m  Gen: awake, alert, appearing stated age Lungs: No accessory muscle use Skin: 2.5 cm x 2 cm erythematous, warm and fluctuant  mass over R medial prox thigh near inguinal fold. No drainage, excoriation Psych: Age appropriate judgment and insight  Procedure note; incision and drainage Informed consent obtained. The area was cleaned with alcohol. The area was anesthetized with 2 mL of 1% lidocaine with epinephrine. Once adequate anesthesia was obtained, a linear incision was made with 11 blade scalpel. Approximately 7 mL of purulent material with blood was expressed. Cultures were taken. Loculations were interrupted with a curved hemostat. The area was packed with approximately 5 cm of 0.25 in iodoform gauze. The area was then dressed with gauze. There were no complications noted. The patient tolerated the procedure well.   Abscess - Plan: PR DRAIN SKIN ABSCESS SIMPLE  Finish Keflex from her OB. Aftercare  instructions verbalized and written down. F/u in 1 week for packing removal.  The patient voiced understanding and agreement to the plan.  Sylva, DO 07/16/19 2:35 PM

## 2019-07-16 NOTE — Patient Instructions (Addendum)
Do not shower for the rest of the day. When you do wash it, use only soap and water. Do not vigorously scrub. Keep the area clean and dry.   Things to look out for: increasing pain not relieved by ibuprofen/acetaminophen, fevers, spreading redness, drainage of pus, or foul odor.  The packing needs to be pulled in 1 week. I can do it or you can pull it out. If it falls out on its own, that is OK.   Finish antibiotic.  Let us know if you need anything.

## 2019-07-19 ENCOUNTER — Telehealth: Payer: Self-pay | Admitting: Family Medicine

## 2019-07-19 ENCOUNTER — Other Ambulatory Visit: Payer: Self-pay

## 2019-07-19 MED ORDER — LEVOFLOXACIN 500 MG PO TABS: 500.0000 mg | ORAL_TABLET | Freq: Every day | ORAL | 0 refills | Status: DC

## 2019-07-19 NOTE — Telephone Encounter (Signed)
Spoke with patient. Advised by Dr. Etter Sjogren to finish current antibiotic and then start Levofloxacin. Pt advised to keep are clean, dry and covered. Pt has follow up with [facility] on 07/23/2019. Pt reported that pain was much better, no redness or heat. Packing still in. Pt states she noticed more yellow/bloody discharge.

## 2019-07-19 NOTE — Telephone Encounter (Signed)
Pt called and stated that she has a boil lanced on 07/16/19. Pt states that it is still draining brown/yellow pus with blood. Pt also states that she is still having some pain. Pt would like to know if this is normal. Please advise

## 2019-07-20 ENCOUNTER — Encounter: Payer: Self-pay | Admitting: Family Medicine

## 2019-07-20 ENCOUNTER — Telehealth: Payer: Self-pay

## 2019-07-20 ENCOUNTER — Other Ambulatory Visit: Payer: Self-pay

## 2019-07-20 ENCOUNTER — Ambulatory Visit (INDEPENDENT_AMBULATORY_CARE_PROVIDER_SITE_OTHER): Payer: Medicare Other | Admitting: Family Medicine

## 2019-07-20 VITALS — BP 144/90 | HR 77 | Temp 96.4°F | Ht 62.0 in | Wt 209.4 lb

## 2019-07-20 DIAGNOSIS — Z09 Encounter for follow-up examination after completed treatment for conditions other than malignant neoplasm: Secondary | ICD-10-CM

## 2019-07-20 MED ORDER — TRAMADOL HCL 50 MG PO TABS: 50.0000 mg | ORAL_TABLET | Freq: Three times a day (TID) | ORAL | 0 refills | Status: AC | PRN

## 2019-07-20 NOTE — Progress Notes (Signed)
CC: Abscess ck  Patient here for follow-up abscess.  Over the weekend, she had many issues with pain and continued drainage of pus and blood.  Pain is much better now but she would like something to help moving forward.  She continues with to put heat on the area which is helpful.  No fevers.  The packing still in place.  She was examined in the presence of a female chaperone.  The area, right proximal thigh medially, is clean and dry with a wick still present.  There is indurated skin with a central opening.  No excessive tenderness palpation, warmth, or fluctuance.  Nothing is able to be expressed currently.  Encounter for recheck of abscess following incision and drainage  Other than the pain, she appears to be progressing well.  We will call in a short course of tramadol for pain control in addition to Tylenol and warm compresses.  Regular PCP called in 7 days of antibiotic that she will finish.  Patient plans to pull the wick on Friday.  She will cancel her follow-up appointment for that day.  She voiced understanding and agreement to the plan.  Crosby Oyster [facility] 4:32 PM 07/20/19

## 2019-07-20 NOTE — Telephone Encounter (Signed)
Called the patient and she is having drainage/some pain and is just concerned..does not want to wait until Friday to have rechecked. Scheduled her today at 4:15 with Dr. Nani Ravens to check.

## 2019-07-20 NOTE — Telephone Encounter (Signed)
Copied from [place] [id number]. Topic: General - Other >> Jul 20, 2019  8:10 AM Keene Breath wrote: Reason for CRM: Patient called to request that she be seen by Dr. Nani Ravens who did a procedure on her last week.  She believes that it might be infected and did not want to wait until Friday to see him.  Tried the office but there was no answer.  Please call patient to schedule appt.  CB# [phone]

## 2019-07-20 NOTE — Patient Instructions (Signed)
Finish the antibiotic from Dr Etter Sjogren.  OK to pull packing on Friday.   This looks good for now.  I am very very sorry about the pain fiasco.   Continue warm compresses.  OK to take Tylenol 1000 mg (2 extra strength tabs) or 975 mg (3 regular strength tabs) every 6 hours as needed.  Do not drink alcohol, do any illicit/street drugs, drive or do anything that requires alertness while on the tramadol.  Let us know if you need anything.

## 2019-07-23 ENCOUNTER — Ambulatory Visit: Payer: Medicare Other | Admitting: Family Medicine

## 2019-07-30 ENCOUNTER — Other Ambulatory Visit: Payer: Self-pay

## 2019-08-02 ENCOUNTER — Ambulatory Visit (INDEPENDENT_AMBULATORY_CARE_PROVIDER_SITE_OTHER): Payer: Medicare Other | Admitting: Family Medicine

## 2019-08-02 ENCOUNTER — Other Ambulatory Visit: Payer: Self-pay

## 2019-08-02 ENCOUNTER — Encounter: Payer: Self-pay | Admitting: Family Medicine

## 2019-08-02 VITALS — BP 138/80 | HR 70 | Temp 97.0°F | Resp 18 | Ht 62.0 in | Wt 209.2 lb

## 2019-08-02 DIAGNOSIS — Z9884 Bariatric surgery status: Secondary | ICD-10-CM

## 2019-08-02 DIAGNOSIS — E041 Nontoxic single thyroid nodule: Secondary | ICD-10-CM

## 2019-08-02 DIAGNOSIS — Z833 Family history of diabetes mellitus: Secondary | ICD-10-CM

## 2019-08-02 DIAGNOSIS — K219 Gastro-esophageal reflux disease without esophagitis: Secondary | ICD-10-CM | POA: Diagnosis not present

## 2019-08-02 MED ORDER — NEXIUM 40 MG PO CPDR: 40.0000 mg | DELAYED_RELEASE_CAPSULE | Freq: Every day | ORAL | 5 refills | Status: DC

## 2019-08-02 NOTE — Progress Notes (Signed)
Subjective:     [name] is a 63 y.o. female and is here for a comprehensive physical exam. The patient reports no problems.  Social History   Socioeconomic History  . Marital status: Married    Spouse name: Yehuda Maondre Zahn  . Number of children: Not on file  . Years of education: Not on file  . Highest education level: Not on file  Occupational History  . Occupation: retired  Engineer, productionocial Needs  . Financial resource strain: Not on file  . Food insecurity    Worry: Not on file    Inability: Not on file  . Transportation needs    Medical: Not on file    Non-medical: Not on file  Tobacco Use  . Smoking status: Never Smoker  . Smokeless tobacco: Never Used  Substance and Sexual Activity  . Alcohol use: No    Alcohol/week: 0.0 standard drinks  . Drug use: No  . Sexual activity: Yes  Lifestyle  . Physical activity    Days per week: Not on file    Minutes per session: Not on file  . Stress: Not on file  Relationships  . Social Musicianconnections    Talks on phone: Not on file    Gets together: Not on file    Attends religious service: Not on file    Active member of club or organization: Not on file    Attends meetings of clubs or organizations: Not on file    Relationship status: Not on file  . Intimate partner violence    Fear of current or ex partner: Not on file    Emotionally abused: Not on file    Physically abused: Not on file    Forced sexual activity: Not on file  Other Topics Concern  . Not on file  Social History Narrative   Occupation: Sports coachQuality Consultant   Daily Caffeine use- 1 cup daily      Health Maintenance  Topic Date Due  . INFLUENZA VACCINE  05/01/2019  . COLONOSCOPY  10/25/2019  . MAMMOGRAM  09/30/2020  . PAP SMEAR-Modifier  09/30/2021  . TETANUS/TDAP  03/16/2024  . Hepatitis C Screening  Completed  . HIV Screening  Completed    The following portions of the patient's history were reviewed and updated as appropriate:  She  has a past medical  history of Abdominal pain, other specified site (03/31/2008), Acute sprain or strain of cervical region (04/10/2012), ALLERGIC RHINITIS (06/26/2007), ANKYLOSING SPONDYLITIS (03/31/2008), Ankylosing spondylitis of multiple sites in spine [facility](HCC), Arthritis, Bariatric surgery status (01/14/2008), Blood in stool (12/02/2008), Blood transfusion without reported diagnosis, Calf pain (10/13/2012), CHEST PAIN UNSPECIFIED (10/11/2009), CONSTIPATION (04/06/2010), CORNEAL ABRASION, LEFT (01/22/2010), Gastritis, GERD (06/26/2007), GERD (gastroesophageal reflux disease), Hiatal hernia, Hypertension, Hypoglycemia, unspecified (01/30/2009), IBS (06/26/2007), LEG CRAMPS, NOCTURNAL (11/14/2010), Long-term current use of steroids (04/20/2013), LYMPHADENOPATHY (06/10/2007), MORBID OBESITY (06/26/2007), MUSCLE PAIN (03/31/2008), Noninfectious gastroenteritis and colitis (03/17/2013), Obesity (BMI 30-39.9) (03/16/2014), OSA (obstructive sleep apnea) (03/08/2016), Other specified disease of white blood cells (07/23/2010), Seasonal allergies (05/14/2015), Sleep apnea, THYROID CYST (06/26/2007), Thyroid nodule, and THYROID NODULE, HX OF (06/10/2007). She does not have any pertinent problems on file. She  has a past surgical history that includes Esophagogastroduodenoscopy; Breast reduction surgery (1996); Total hip arthroplasty; Bariatric Surgery; pelvis replacement (2010); Upper gastrointestinal endoscopy; and Colonoscopy. Her family history includes Arthritis in her father; Breast cancer in her maternal grandmother; Diabetes in her father and sister; High Cholesterol in her father and mother; Hypertension in her father, mother, and sister;  Melanoma in her maternal grandmother. She  reports that she has never smoked. She has never used smokeless tobacco. She reports that she does not drink alcohol or use drugs. She has a current medication list which includes the following prescription(s): b complex vitamins, ferrous sulfate, multivitamin-iron-minerals-folic acid,  nexium, prednisone, and b-1. Current Outpatient Medications on File Prior to Visit  Medication Sig Dispense Refill  . b complex vitamins capsule Take 1 capsule by mouth daily.    . ferrous sulfate 325 (65 FE) MG tablet Take 325 mg by mouth daily with breakfast.    . multivitamin-iron-minerals-folic acid (CENTRUM) chewable tablet Chew 1 tablet by mouth daily.    . predniSONE (DELTASONE) 5 MG tablet Take 5 mg by mouth daily with breakfast.    . Thiamine HCl (B-1) 100 MG TABS Take 100 mg by mouth daily.     No current facility-administered medications on file prior to visit.    She is allergic to aspirin; codeine; morphine; and sulfonamide derivatives..  Review of Systems Review of Systems  Constitutional: Negative for activity change, appetite change and fatigue.  HENT: Negative for hearing loss, congestion, tinnitus and ear discharge.  dentist q66m Eyes: Negative for visual disturbance (see optho q1y -- vision corrected to 20/20 with glasses).  Respiratory: Negative for cough, chest tightness and shortness of breath.   Cardiovascular: Negative for chest pain, palpitations and leg swelling.  Gastrointestinal: Negative for abdominal pain, diarrhea, constipation and abdominal distention.  Genitourinary: Negative for urgency, frequency, decreased urine volume and difficulty urinating.  Musculoskeletal: Negative for back pain, arthralgias and gait problem.  Skin: Negative for color change, pallor and rash.  Neurological: Negative for dizziness, light-headedness, numbness and headaches.  Hematological: Negative for adenopathy. Does not bruise/bleed easily.  Psychiatric/Behavioral: Negative for suicidal ideas, confusion, sleep disturbance, self-injury, dysphoric mood, decreased concentration and agitation.       Objective:    BP 138/80 (BP Location: Right Arm, Patient Position: Sitting, Cuff Size: Large)   Pulse 70   Temp (!) 97 F (36.1 C) (Temporal)   Resp 18   Ht 5\' 2"  (1.575 m)   Wt  209 lb 3.2 oz (94.9 kg)   SpO2 99%   BMI 38.26 kg/m  General appearance: alert, cooperative, appears stated age and no distress Head: Normocephalic, without obvious abnormality, atraumatic Eyes: negative findings: lids and lashes normal, conjunctivae and sclerae normal and pupils equal, round, reactive to light and accomodation Ears: normal TM's and external ear canals both ears Nose: Nares normal. Septum midline. Mucosa normal. No drainage or sinus tenderness. Throat: lips, mucosa, and tongue normal; teeth and gums normal Neck: no adenopathy, no carotid bruit, no JVD, supple, symmetrical, trachea midline and thyroid not enlarged, symmetric, no tenderness/mass/nodules Back: symmetric, no curvature. ROM normal. No CVA tenderness. Lungs: clear to auscultation bilaterally Breasts: normal appearance, no masses or tenderness Heart: regular rate and rhythm, S1, S2 normal, no murmur, click, rub or gallop Abdomen: soft, non-tender; bowel sounds normal; no masses,  no organomegaly Pelvic: deferred Extremities: extremities normal, atraumatic, no cyanosis or edema Pulses: 2+ and symmetric Skin: Skin color, texture, turgor normal. No rashes or lesions Lymph nodes: Cervical, supraclavicular, and axillary nodes normal. Neurologic: Alert and oriented X 3, normal strength and tone. Normal symmetric reflexes. Normal coordination and gait    Assessment:    Healthy female exam.      Plan:    ghm utd Check labs  See After Visit Summary for Counseling Recommendations    1. Gastroesophageal reflux disease,  unspecified whether esophagitis present Refill meds--- stable  - NEXIUM 40 MG capsule; Take 1 capsule (40 mg total) by mouth daily at 12 noon.  Dispense: 30 capsule; Refill: 5  2. Family history of diabetes mellitus  - Hemoglobin A1c - CBC with Differential/Platelet - Comprehensive metabolic panel - Lipid panel  3. H/O bariatric surgery  - CBC with Differential/Platelet - Comprehensive  metabolic panel - Vitamin D (25 hydroxy) - Vitamin B12  4. Thyroid nodule  - US THYROID; Future

## 2019-08-02 NOTE — Patient Instructions (Signed)

## 2019-08-03 LAB — CBC WITH DIFFERENTIAL/PLATELET
Basophils Absolute: 0.1 10*3/uL (ref 0.0–0.1)
Basophils Relative: 1.1 % (ref 0.0–3.0)
Eosinophils Absolute: 0.1 10*3/uL (ref 0.0–0.7)
Eosinophils Relative: 2 % (ref 0.0–5.0)
HCT: 40.5 % (ref 36.0–46.0)
Hemoglobin: 13.4 g/dL (ref 12.0–15.0)
Lymphocytes Relative: 48.2 % — ABNORMAL HIGH (ref 12.0–46.0)
Lymphs Abs: 2.4 10*3/uL (ref 0.7–4.0)
MCHC: 33 g/dL (ref 30.0–36.0)
MCV: 97.4 fl (ref 78.0–100.0)
Monocytes Absolute: 0.6 10*3/uL (ref 0.1–1.0)
Monocytes Relative: 11.9 % (ref 3.0–12.0)
Neutro Abs: 1.8 10*3/uL (ref 1.4–7.7)
Neutrophils Relative %: 36.8 % — ABNORMAL LOW (ref 43.0–77.0)
Platelets: 313 10*3/uL (ref 150.0–400.0)
RBC: 4.16 Mil/uL (ref 3.87–5.11)
RDW: 13.3 % (ref 11.5–15.5)
WBC: 5 10*3/uL (ref 4.0–10.5)

## 2019-08-03 LAB — LIPID PANEL
Cholesterol: 147 mg/dL (ref 0–200)
HDL: 74.2 mg/dL (ref >39.00)
LDL Cholesterol: 59 mg/dL (ref 0–99)
NonHDL: 73.28
Total CHOL/HDL Ratio: 2
Triglycerides: 69 mg/dL (ref 0.0–149.0)
VLDL: 13.8 mg/dL (ref 0.0–40.0)

## 2019-08-03 LAB — COMPREHENSIVE METABOLIC PANEL
ALT: 20 U/L (ref 0–35)
AST: 21 U/L (ref 0–37)
Albumin: 4.2 g/dL (ref 3.5–5.2)
Alkaline Phosphatase: 71 U/L (ref 39–117)
BUN: 16 mg/dL (ref 6–23)
CO2: 26 mEq/L (ref 19–32)
Calcium: 9.4 mg/dL (ref 8.4–10.5)
Chloride: 106 mEq/L (ref 96–112)
Creatinine, Ser: 0.67 mg/dL (ref 0.40–1.20)
GFR: 108.31 mL/min (ref >60.00)
Glucose, Bld: 90 mg/dL (ref 70–99)
Potassium: 4.2 mEq/L (ref 3.5–5.1)
Sodium: 139 mEq/L (ref 135–145)
Total Bilirubin: 0.5 mg/dL (ref 0.2–1.2)
Total Protein: 6.9 g/dL (ref 6.0–8.3)

## 2019-08-03 LAB — VITAMIN D 25 HYDROXY (VIT D DEFICIENCY, FRACTURES): VITD: 35.62 ng/mL (ref 30.00–100.00)

## 2019-08-03 LAB — VITAMIN B12: Vitamin B-12: 596 pg/mL (ref 211–911)

## 2019-08-03 LAB — HEMOGLOBIN A1C: Hgb A1c MFr Bld: 5.4 % (ref 4.6–6.5)

## 2019-08-06 ENCOUNTER — Ambulatory Visit (HOSPITAL_BASED_OUTPATIENT_CLINIC_OR_DEPARTMENT_OTHER): Payer: Medicare Other

## 2019-08-10 DIAGNOSIS — Z471 Aftercare following joint replacement surgery: Secondary | ICD-10-CM | POA: Diagnosis not present

## 2019-08-10 DIAGNOSIS — Z96642 Presence of left artificial hip joint: Secondary | ICD-10-CM | POA: Diagnosis not present

## 2019-08-10 DIAGNOSIS — M5410 Radiculopathy, site unspecified: Secondary | ICD-10-CM | POA: Diagnosis not present

## 2019-08-17 ENCOUNTER — Ambulatory Visit (HOSPITAL_BASED_OUTPATIENT_CLINIC_OR_DEPARTMENT_OTHER)
Admission: RE | Admit: 2019-08-17 | Discharge: 2019-08-17 | Disposition: A | Discharge status: Home / Self Care | Payer: Medicare Other | Source: Ambulatory Visit | Attending: Family Medicine | Admitting: Family Medicine

## 2019-08-17 ENCOUNTER — Other Ambulatory Visit: Payer: Self-pay

## 2019-08-17 DIAGNOSIS — E042 Nontoxic multinodular goiter: Secondary | ICD-10-CM | POA: Diagnosis not present

## 2019-08-17 DIAGNOSIS — E041 Nontoxic single thyroid nodule: Secondary | ICD-10-CM | POA: Diagnosis not present

## 2019-12-03 IMAGING — US US THYROID
1 series · 13 of 25 positions shown · non-contrast
Comparison: 05/31/2011

CLINICAL DATA: 62-year-old female with a history thyroid nodules.

Given history of a prior biopsy of right thyroid nodule
EXAM:
THYROID ULTRASOUND
TECHNIQUE: Ultrasound examination of the thyroid gland and adjacent soft
tissues was performed.

[Series 1: us thyroid · 13 of 34 slices shown]
[im 1/34]
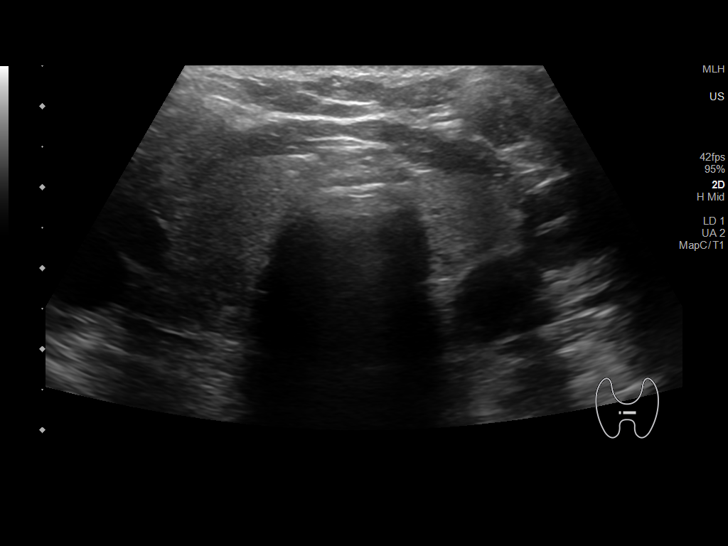
[im 3/34]
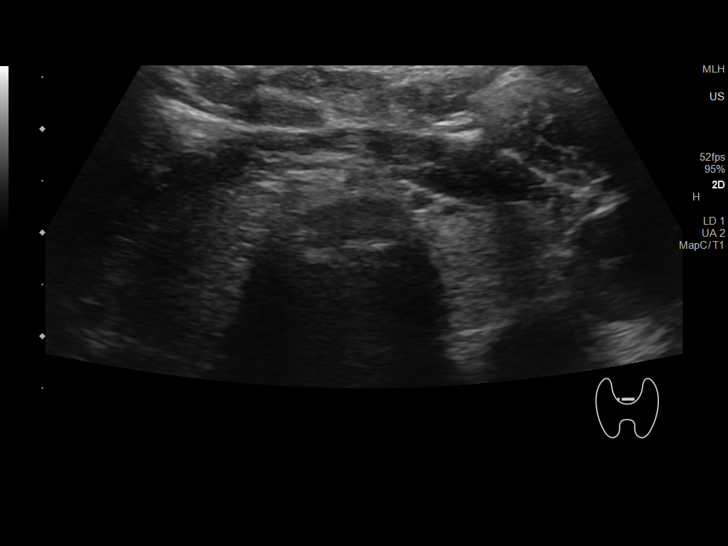
[im 6/34]
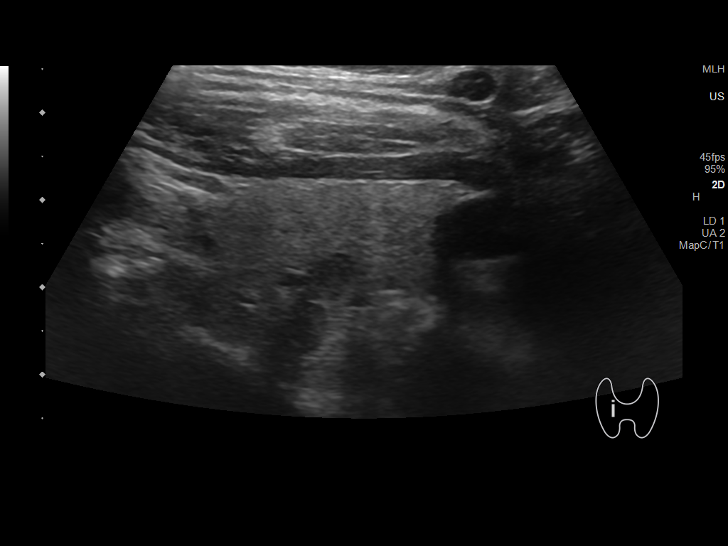
[im 9/34]
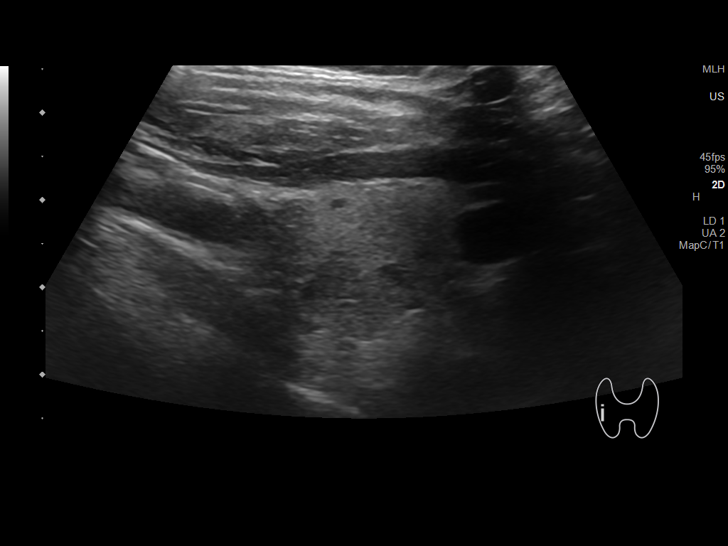
[im 12/34]
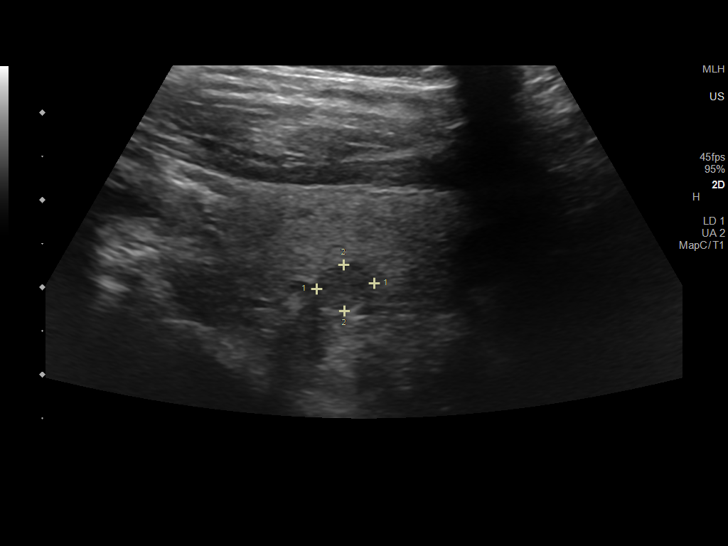
[im 14/34]
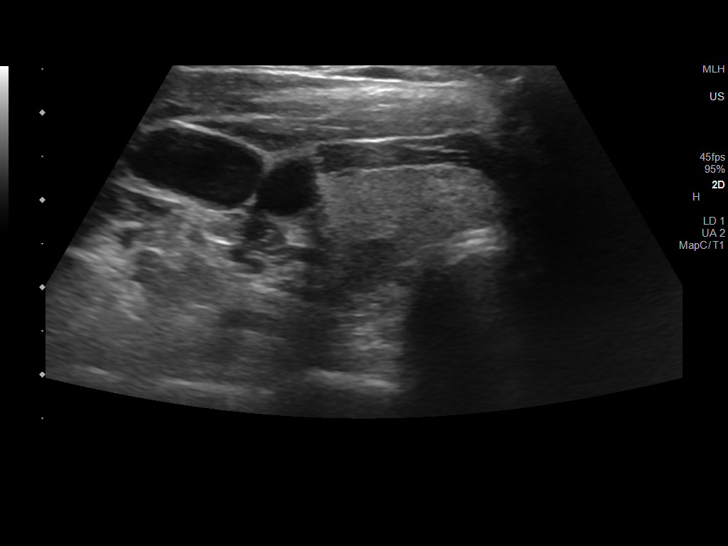
[im 17/34]
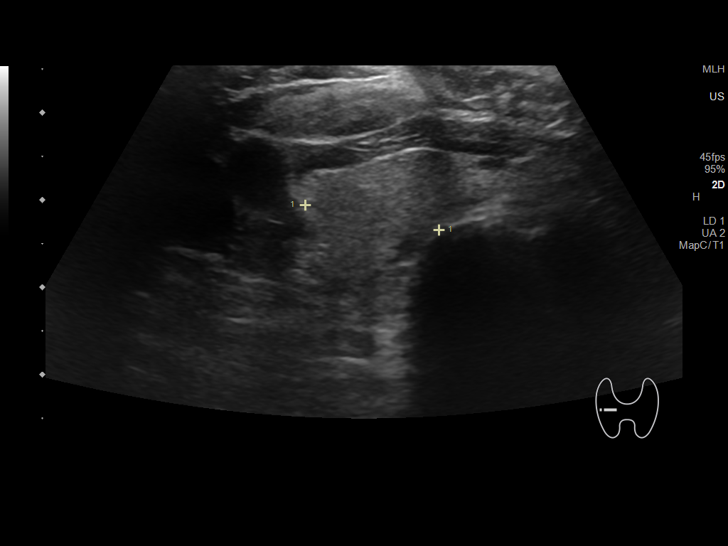
[im 20/34]
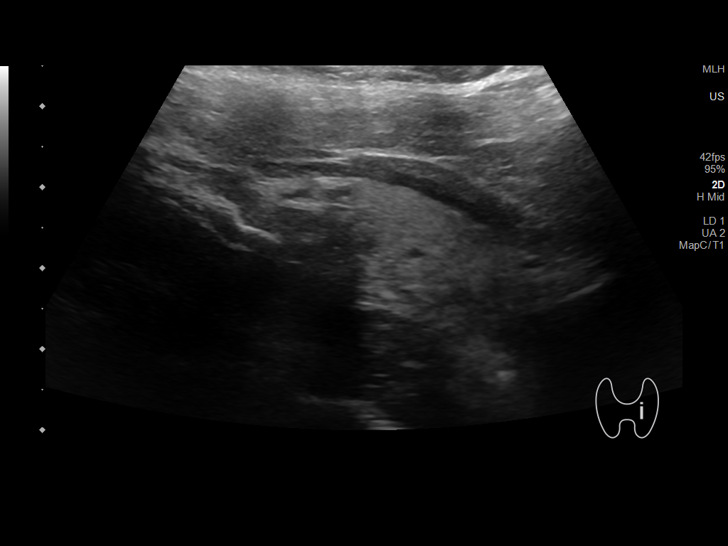
[im 23/34]
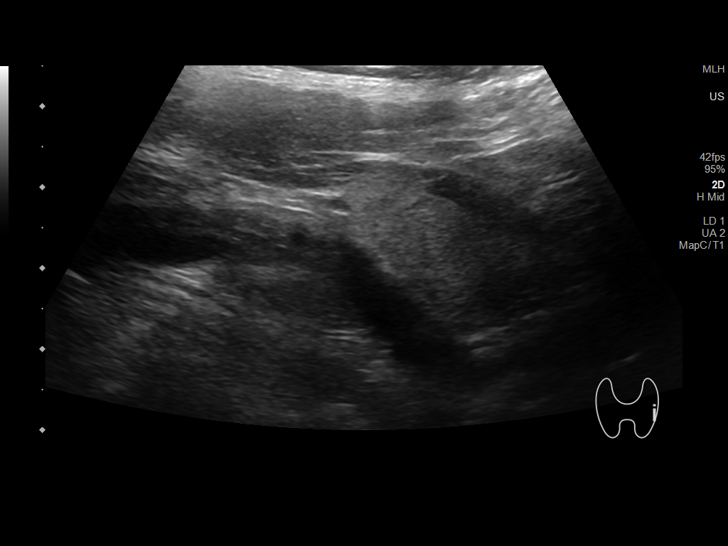
[im 25/34]
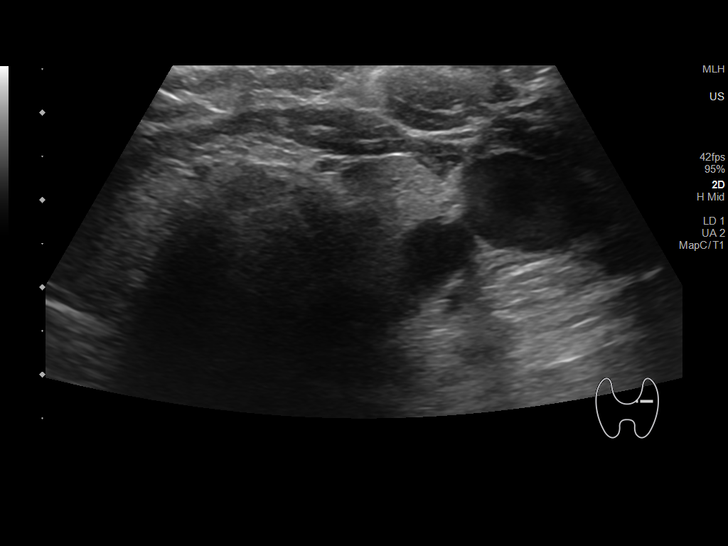
[im 28/34]
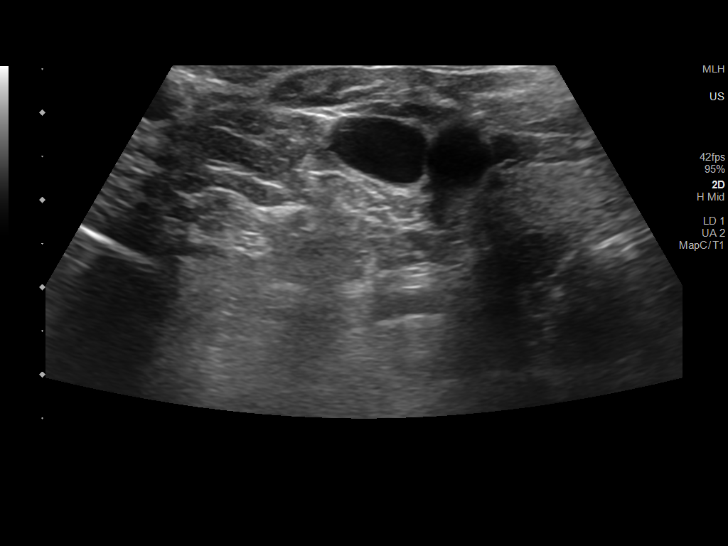
[im 31/34]
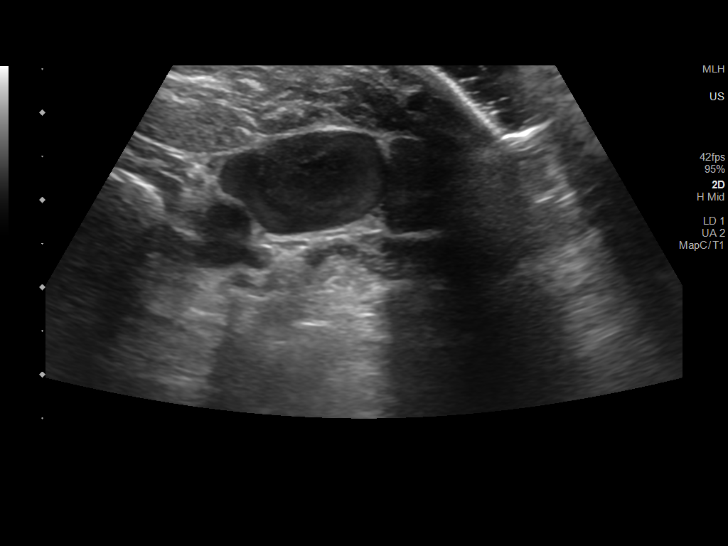
[im 34/34]
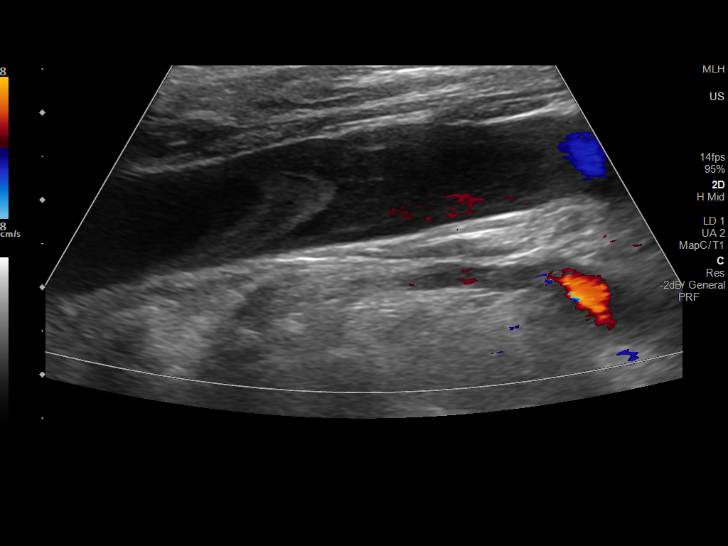

[13 of 25 positions shown; findings below may reference images not displayed]

FINDINGS: Parenchymal Echotexture: Normal

Isthmus: 0.4 cm

Right lobe: 2.9 cm x 1.3 cm x 1.6 cm

Left lobe: 2.8 cm x 1.5 cm x 1.4 cm

_________________________________________________________

Estimated total number of nodules >/= 1 cm: 0

Number of spongiform nodules >/=  2 cm not described below (TR1): 0

Number of mixed cystic and solid nodules >/= 1.5 cm not described
below (TR2): 0

_________________________________________________________

Nodule # 1:

Location: Right; Inferior

Maximum size: 0.7 cm; Other 2 dimensions: 0.5 cm x 0.7 cm

Composition: cannot determine (2)

Echogenicity: hypoechoic (2)

Shape: not taller-than-wide (0)

Margins: smooth (0)

Echogenic foci: none (0)

ACR TI-RADS total points: 4.

ACR TI-RADS risk category: TR4 (4-6 points).

ACR TI-RADS recommendations:

Nodule does not meet criteria for surveillance or biopsy. This
nodule has been previously biopsied by report.

_________________________________________________________

No adenopathy
IMPRESSION: No thyroid nodule meets criteria for biopsy or surveillance, as
designated by the newly established ACR TI-RADS criteria.

Recommendations follow those established by the new ACR TI-RADS
criteria ([HOSPITAL] 6276;[DATE]).

## 2019-12-30 ENCOUNTER — Encounter: Payer: Self-pay | Admitting: Gastroenterology

## 2020-01-01 DIAGNOSIS — H527 Unspecified disorder of refraction: Secondary | ICD-10-CM | POA: Diagnosis not present

## 2020-01-14 DIAGNOSIS — Z1231 Encounter for screening mammogram for malignant neoplasm of breast: Secondary | ICD-10-CM | POA: Diagnosis not present

## 2020-01-17 ENCOUNTER — Ambulatory Visit (AMBULATORY_SURGERY_CENTER): Payer: Self-pay | Admitting: *Deleted

## 2020-01-17 ENCOUNTER — Other Ambulatory Visit: Payer: Self-pay

## 2020-01-17 VITALS — Temp 96.9°F | Ht 62.0 in | Wt 204.0 lb

## 2020-01-17 DIAGNOSIS — Z1211 Encounter for screening for malignant neoplasm of colon: Secondary | ICD-10-CM

## 2020-01-17 MED ORDER — NA SULFATE-K SULFATE-MG SULF 17.5-3.13-1.6 GM/177ML PO SOLN: ORAL | 0 refills | Status: DC

## 2020-01-17 NOTE — Progress Notes (Signed)
Patient is here in-person for PV. Patient denies any allergies to eggs or soy. Patient denies any problems with anesthesia/sedation. Patient denies any oxygen use at home. Patient denies taking any diet/weight loss medications or blood thinners. Patient is not being treated for MRSA or C-diff. EMMI education assisgned to the patient for the procedure, this was explained and instructions given to patient. Patient is aware of our care-partner policy and Covid-19 safety protocol.  Covid vaccines completed per pt. 2nd dose on  12/07/2019.

## 2020-01-18 ENCOUNTER — Telehealth: Payer: Self-pay | Admitting: Gastroenterology

## 2020-01-18 NOTE — Telephone Encounter (Signed)
Robbin - I see you sent this suprep yesterday "no substitutions".   Please advise pt. Per [facility], no issue with Gastric Bypass in 2006.  If you want to use Sutab since she has Medicare, be sure to use the activation cards we have.  We have had good luck with patients paying around $40.

## 2020-01-18 NOTE — Telephone Encounter (Signed)
I explained the Sutab pills with the patient and she does not feel the pills will work well due to her gastric bypass sx. Pt wants to stay with the Suprep at this time. We do not have any samples at this time. She will call us back if she changes her mind.

## 2020-01-26 ENCOUNTER — Encounter: Payer: Self-pay | Admitting: Gastroenterology

## 2020-01-31 ENCOUNTER — Encounter: Payer: Medicare Other | Admitting: Gastroenterology

## 2020-02-29 ENCOUNTER — Ambulatory Visit (INDEPENDENT_AMBULATORY_CARE_PROVIDER_SITE_OTHER): Payer: Medicare Other | Admitting: Family Medicine

## 2020-02-29 ENCOUNTER — Other Ambulatory Visit: Payer: Self-pay

## 2020-02-29 ENCOUNTER — Encounter (INDEPENDENT_AMBULATORY_CARE_PROVIDER_SITE_OTHER): Payer: Self-pay | Admitting: Family Medicine

## 2020-02-29 VITALS — BP 127/85 | HR 74 | Temp 98.1°F | Ht 62.0 in | Wt 202.0 lb

## 2020-02-29 DIAGNOSIS — E559 Vitamin D deficiency, unspecified: Secondary | ICD-10-CM

## 2020-02-29 DIAGNOSIS — R0602 Shortness of breath: Secondary | ICD-10-CM

## 2020-02-29 DIAGNOSIS — Z0289 Encounter for other administrative examinations: Secondary | ICD-10-CM

## 2020-02-29 DIAGNOSIS — G4733 Obstructive sleep apnea (adult) (pediatric): Secondary | ICD-10-CM | POA: Diagnosis not present

## 2020-02-29 DIAGNOSIS — Z8639 Personal history of other endocrine, nutritional and metabolic disease: Secondary | ICD-10-CM | POA: Diagnosis not present

## 2020-02-29 DIAGNOSIS — R5383 Other fatigue: Secondary | ICD-10-CM

## 2020-02-29 DIAGNOSIS — Z6836 Body mass index (BMI) 36.0-36.9, adult: Secondary | ICD-10-CM

## 2020-02-29 DIAGNOSIS — Z1331 Encounter for screening for depression: Secondary | ICD-10-CM | POA: Diagnosis not present

## 2020-02-29 DIAGNOSIS — I10 Essential (primary) hypertension: Secondary | ICD-10-CM | POA: Diagnosis not present

## 2020-02-29 NOTE — Progress Notes (Signed)
Chief Complaint:   OBESITY [name] (MR# [id number]) is a 64 y.o. female who presents for evaluation and treatment of obesity and related comorbidities. Current BMI is Body mass index is 36.95 kg/m. [name] has been struggling with her weight for many years and has been unsuccessful in either losing weight, maintaining weight loss, or reaching her healthy weight goal.  [name] is status post weight loss surgery gastric bypass in 2006. She went from 230 lbs to 147.5 lbs in 1 year. She starting regaining weight 8 months later due to increased eating.  [name] is currently in the action stage of change and ready to dedicate time achieving and maintaining a healthier weight. [name] is interested in becoming our patient and working on intensive lifestyle modifications including (but not limited to) diet and exercise for weight loss.  [name]'s habits were reviewed today and are as follows: Her family eats meals together, she thinks her family will eat healthier with her, her desired weight loss is 54 lbs, she has been heavy most of her life, she started gaining weight in 2011, her heaviest weight ever was 230 pounds, she is a picky eater and doesn't like to eat healthier foods, she has significant food cravings issues, she snacks frequently in the evenings, she skips meals frequently and she struggles with emotional eating.  Depression Screen [name]'s Food and Mood (modified PHQ-9) score was 11.  Depression screen PHQ 2/9 02/29/2020  Decreased Interest 3  Down, Depressed, Hopeless 0  PHQ - 2 Score 3  Altered sleeping 3  Tired, decreased energy 3  Change in appetite 1  Feeling bad or failure about yourself  0  Trouble concentrating 1  Moving slowly or fidgety/restless 0  Suicidal thoughts 0  PHQ-9 Score 11  Difficult doing work/chores Not difficult at all   Subjective:   1. Other fatigue [name] admits to daytime somnolence and admits to waking up still tired. Patent has a  history of symptoms of daytime fatigue. [name] generally gets 4 or 5 hours of sleep per night, and states that she has nightime awakenings. Snoring is present. Apneic episodes are not present. Epworth Sleepiness Score is 11.  2. Short of breath on exertion [name] notes increasing shortness of breath with exercising and seems to be worsening over time with weight gain. She notes getting out of breath sooner with activity than she used to. This has not gotten worse recently. [name] denies shortness of breath at rest or orthopnea.  3. Vitamin D deficiency [name] is on OTC daily Centrum, and Vit D 50 mcg q daily.  4. Obstructive sleep apnea Per [name], she notes mild obstructive sleep apnea. She did not tolerate CPAP. I recommended to use a dental appliance. Per the patient, she had a sleep study after her weight loss surgery.  5. Essential hypertension [name] is on anti-hypertensive therapy.  6. History of thyroid nodule [name] is not on levothyroxine.  Assessment/Plan:   1. Other fatigue [name] does feel that her weight is causing her energy to be lower than it should be. Fatigue may be related to obesity, depression or many other causes. Labs will be ordered, and in the meanwhile, [name] will focus on self care including making healthy food choices, increasing physical activity and focusing on stress reduction.  - EKG 12-Lead - Comprehensive metabolic panel - CBC with Differential/Platelet - Lipid Panel With LDL/HDL Ratio - T3 - T4, free - TSH  2. Short of breath on exertion [name] does feel  that she gets out of breath more easily that she used to when she exercises. [name]'s shortness of breath appears to be obesity related and exercise induced. She has agreed to work on weight loss and gradually increase exercise to treat her exercise induced shortness of breath. Will continue to monitor closely.  3. Vitamin D deficiency Low Vitamin D level contributes to fatigue and are  associated with obesity, breast, and colon cancer. We will check labs today, and [name] will follow-up for routine testing of Vitamin D, at least 2-3 times per year to avoid over-replacement.  - VITAMIN D 25 Hydroxy (Vit-D Deficiency, Fractures)  4. Obstructive sleep apnea Intensive lifestyle modifications are the first line treatment for this issue. We discussed several lifestyle modifications today.  [name] will start her Category 1 meal plan, and will continue to work on exercise and weight loss efforts. We will continue to monitor. Orders and follow up as documented in patient record.   5. Essential hypertension [name] is working on healthy weight loss and exercise to improve blood pressure control. We will watch for signs of hypotension as she continues her lifestyle modifications. We will check labs today.  - Comprehensive metabolic panel - CBC with Differential/Platelet - Lipid Panel With LDL/HDL Ratio  6. History of thyroid nodule We will check labs today. Orders and follow up as documented in patient record.  - T3 - T4, free - TSH  7. Depression screening [name] had a positive depression screening. Depression is commonly associated with obesity and often results in emotional eating behaviors. We will monitor this closely and work on CBT to help improve the non-hunger eating patterns. Referral to Psychology may be required if no improvement is seen as she continues in our clinic.  8. Class 2 severe obesity with serious comorbidity and body mass index (BMI) of 36.0 to 36.9 in adult, unspecified obesity type ([name]) [name] is currently in the action stage of change and her goal is to continue with weight loss efforts. I recommend [name] begin the structured treatment plan as follows:  She has agreed to the Category 1 Plan.  Exercise goals: As is.   Behavioral modification strategies: increasing lean protein intake, decreasing simple carbohydrates and meal planning and cooking  strategies.  She was informed of the importance of frequent follow-up visits to maximize her success with intensive lifestyle modifications for her multiple health conditions. She was informed we would discuss her lab results at her next visit unless there is a critical issue that needs to be addressed sooner. [name] agreed to keep her next visit at the agreed upon time to discuss these results.  Objective:   Blood pressure 127/85, pulse 74, temperature 98.1 F (36.7 C), temperature source Oral, height 5\' 2"  (1.575 m), weight 202 lb (91.6 kg), SpO2 100 %. Body mass index is 36.95 kg/m.  EKG: Normal sinus rhythm, rate 78 BPM.  Indirect Calorimeter completed today shows a VO2 of 189 and a REE of 1312.  Her calculated basal metabolic rate is 8921 thus her basal metabolic rate is worse than expected.  General: Cooperative, alert, well developed, in no acute distress. HEENT: Conjunctivae and lids unremarkable. Cardiovascular: Regular rhythm.  Lungs: Normal work of breathing. Neurologic: No focal deficits.   Lab Results  Component Value Date   CREATININE 0.67 08/02/2019   BUN 16 08/02/2019   NA 139 08/02/2019   K 4.2 08/02/2019   CL 106 08/02/2019   CO2 26 08/02/2019   Lab Results  Component Value Date  ALT 20 08/02/2019   AST 21 08/02/2019   ALKPHOS 71 08/02/2019   BILITOT 0.5 08/02/2019   Lab Results  Component Value Date   HGBA1C 5.4 08/02/2019   HGBA1C 5.3 02/18/2018   HGBA1C 5.5 06/11/2016   HGBA1C 5.5 04/20/2013   Lab Results  Component Value Date   INSULIN 3.0 02/18/2018   Lab Results  Component Value Date   TSH 1.43 07/30/2018   Lab Results  Component Value Date   CHOL 147 08/02/2019   HDL 74.20 08/02/2019   LDLCALC 59 08/02/2019   TRIG 69.0 08/02/2019   CHOLHDL 2 08/02/2019   Lab Results  Component Value Date   WBC 5.0 08/02/2019   HGB 13.4 08/02/2019   HCT 40.5 08/02/2019   MCV 97.4 08/02/2019   PLT 313.0 08/02/2019   Lab Results  Component  Value Date   IRON 120 06/27/2010   FERRITIN 42.7 06/27/2010   Attestation Statements:   Reviewed by clinician on day of visit: allergies, medications, problem list, medical history, surgical history, family history, social history, and previous encounter notes.  Time spent on visit including pre-visit chart review and post-visit charting and care was 47 minutes.    I, Burt Knack, am acting as transcriptionist for Quillian Quince, MD.  I have reviewed the above documentation for accuracy and completeness, and I agree with the above. - Quillian Quince, MD

## 2020-03-01 LAB — CBC WITH DIFFERENTIAL/PLATELET
Basophils Absolute: 0 10*3/uL (ref 0.0–0.2)
Basos: 1 %
EOS (ABSOLUTE): 0.1 10*3/uL (ref 0.0–0.4)
Eos: 2 %
Hematocrit: 42.7 % (ref 34.0–46.6)
Hemoglobin: 14.2 g/dL (ref 11.1–15.9)
Immature Grans (Abs): 0 10*3/uL (ref 0.0–0.1)
Immature Granulocytes: 0 %
Lymphocytes Absolute: 1.9 10*3/uL (ref 0.7–3.1)
Lymphs: 43 %
MCH: 31.8 pg (ref 26.6–33.0)
MCHC: 33.3 g/dL (ref 31.5–35.7)
MCV: 96 fL (ref 79–97)
Monocytes Absolute: 0.4 10*3/uL (ref 0.1–0.9)
Monocytes: 10 %
Neutrophils Absolute: 1.9 10*3/uL (ref 1.4–7.0)
Neutrophils: 44 %
Platelets: 307 10*3/uL (ref 150–450)
RBC: 4.47 x10E6/uL (ref 3.77–5.28)
RDW: 11.8 % (ref 11.7–15.4)
WBC: 4.4 10*3/uL (ref 3.4–10.8)

## 2020-03-01 LAB — COMPREHENSIVE METABOLIC PANEL
ALT: 15 IU/L (ref 0–32)
AST: 20 IU/L (ref 0–40)
Albumin/Globulin Ratio: 1.5 (ref 1.2–2.2)
Albumin: 4.3 g/dL (ref 3.8–4.8)
Alkaline Phosphatase: 95 IU/L (ref 48–121)
BUN/Creatinine Ratio: 15 (ref 12–28)
BUN: 9 mg/dL (ref 8–27)
Bilirubin Total: 0.6 mg/dL (ref 0.0–1.2)
CO2: 26 mmol/L (ref 20–29)
Calcium: 9.7 mg/dL (ref 8.7–10.3)
Chloride: 101 mmol/L (ref 96–106)
Creatinine, Ser: 0.61 mg/dL (ref 0.57–1.00)
GFR calc Af Amer: 113 mL/min/{1.73_m2} (ref >59)
GFR calc non Af Amer: 98 mL/min/{1.73_m2} (ref >59)
Globulin, Total: 2.9 g/dL (ref 1.5–4.5)
Glucose: 88 mg/dL (ref 65–99)
Potassium: 4.5 mmol/L (ref 3.5–5.2)
Sodium: 140 mmol/L (ref 134–144)
Total Protein: 7.2 g/dL (ref 6.0–8.5)

## 2020-03-01 LAB — TSH: TSH: 1.34 u[IU]/mL (ref 0.450–4.500)

## 2020-03-01 LAB — LIPID PANEL WITH LDL/HDL RATIO
Cholesterol, Total: 150 mg/dL (ref 100–199)
HDL: 79 mg/dL (ref >39)
LDL Chol Calc (NIH): 61 mg/dL (ref 0–99)
LDL/HDL Ratio: 0.8 ratio (ref 0.0–3.2)
Triglycerides: 47 mg/dL (ref 0–149)
VLDL Cholesterol Cal: 10 mg/dL (ref 5–40)

## 2020-03-01 LAB — T3: T3, Total: 100 ng/dL (ref 71–180)

## 2020-03-01 LAB — VITAMIN D 25 HYDROXY (VIT D DEFICIENCY, FRACTURES): Vit D, 25-Hydroxy: 39.9 ng/mL (ref 30.0–100.0)

## 2020-03-01 LAB — T4, FREE: Free T4: 0.97 ng/dL (ref 0.82–1.77)

## 2020-03-10 ENCOUNTER — Other Ambulatory Visit: Payer: Self-pay

## 2020-03-10 ENCOUNTER — Encounter: Payer: Self-pay | Admitting: Nurse Practitioner

## 2020-03-10 ENCOUNTER — Telehealth (INDEPENDENT_AMBULATORY_CARE_PROVIDER_SITE_OTHER): Payer: Medicare Other | Admitting: Nurse Practitioner

## 2020-03-10 VITALS — Ht 62.0 in | Wt 202.0 lb

## 2020-03-10 DIAGNOSIS — M7989 Other specified soft tissue disorders: Secondary | ICD-10-CM

## 2020-03-10 DIAGNOSIS — H938X1 Other specified disorders of right ear: Secondary | ICD-10-CM

## 2020-03-10 MED ORDER — DIPHENHYDRAMINE HCL 25 MG PO CAPS: 25.0000 mg | ORAL_CAPSULE | Freq: Three times a day (TID) | ORAL | 0 refills | Status: DC | PRN

## 2020-03-10 NOTE — Patient Instructions (Addendum)
Use benadryl for ear fullness Use warm compress 2-3x/day, at a time Need F2F appt if symptoms do not improve in 3days or if symptoms worsen.

## 2020-03-10 NOTE — Progress Notes (Signed)
Virtual Visit via Video Note  I connected with@ on 03/10/20 at  8:00 AM EDT by a video enabled telemedicine application and verified that I am speaking with the correct person using two identifiers.  Location: Patient:Home Provider: Office Participants: patient and provider  I discussed the limitations of evaluation and management by telemedicine and the availability of in person appointments. I also discussed with the patient that there may be a patient responsible charge related to this service. The patient expressed understanding and agreed to proceed.  CC:Pt reports last Tuesday she was in [place] and a acadia was on her neck-she thinks she got some bites from it on back of her neck and shoulders-bumps are on shoulders and neck and one near ear that don't itch but extreely sore with some pus in them//pt tried antihistamines and alcohol but nothing helps  History of Present Illness: Ms. Silfies reports nodule on right shoulder since Tuesday. Onset after removing a cicadas on her shoulder. This is associated with right ear fullness and popping sensation. She denies any fever or other upper respiratory symptoms. She denies any recent water activity. Had some improvement with topical antihistamine cream. She is concerned about persistent nodule and tenderness   Observations/Objective: Physical Exam Vitals reviewed.  HENT:     Right Ear: External ear normal.     Left Ear: External ear normal.  Neck:     Comments: Limited neck ROM due to AS (chronic per patient) Skin:    Findings: No erythema or rash.  Neurological:     Mental Status: She is alert and oriented to person, place, and time.    Assessment and Plan: Yoko was seen today for mass.  Diagnoses and all orders for this visit:  Nodule of soft tissue  Ear fullness, right -     diphenhydrAMINE (BENADRYL) 25 mg capsule; Take 1 capsule (25 mg total) by mouth every 8 (eight) hours as needed.   Follow Up Instructions: Do not  think nodule is related to insect bite Use benadryl for ear fullness Use warm compress 2-3x/day, at a time Need F2F appt if symptoms do not improve in 3days or if symptoms worsen.   I discussed the assessment and treatment plan with the patient. The patient was provided an opportunity to ask questions and all were answered. The patient agreed with the plan and demonstrated an understanding of the instructions.   The patient was advised to call back or seek an in-person evaluation if the symptoms worsen or if the condition fails to improve as anticipated.  Alysia Penna, NP

## 2020-03-14 ENCOUNTER — Ambulatory Visit (INDEPENDENT_AMBULATORY_CARE_PROVIDER_SITE_OTHER): Payer: Medicare Other | Admitting: Family Medicine

## 2020-03-14 ENCOUNTER — Encounter (INDEPENDENT_AMBULATORY_CARE_PROVIDER_SITE_OTHER): Payer: Self-pay | Admitting: Family Medicine

## 2020-03-14 ENCOUNTER — Other Ambulatory Visit: Payer: Self-pay

## 2020-03-14 VITALS — BP 147/88 | HR 84 | Temp 97.8°F | Ht 62.0 in | Wt 203.0 lb

## 2020-03-14 DIAGNOSIS — G4733 Obstructive sleep apnea (adult) (pediatric): Secondary | ICD-10-CM | POA: Diagnosis not present

## 2020-03-14 DIAGNOSIS — I10 Essential (primary) hypertension: Secondary | ICD-10-CM | POA: Diagnosis not present

## 2020-03-14 DIAGNOSIS — E559 Vitamin D deficiency, unspecified: Secondary | ICD-10-CM | POA: Diagnosis not present

## 2020-03-14 DIAGNOSIS — Z6837 Body mass index (BMI) 37.0-37.9, adult: Secondary | ICD-10-CM | POA: Diagnosis not present

## 2020-03-14 DIAGNOSIS — K5909 Other constipation: Secondary | ICD-10-CM

## 2020-03-14 MED ORDER — POLYETHYLENE GLYCOL 3350 17 GM/SCOOP PO POWD: 17.0000 g | Freq: Two times a day (BID) | ORAL | 0 refills | Status: DC | PRN

## 2020-03-16 NOTE — Progress Notes (Signed)
Chief Complaint:   [name] is here to discuss her progress with her obesity treatment plan along with follow-up of her obesity related diagnoses. [name] is on the Category 1 Plan and states she is following her eating plan approximately 50% of the time. [name] states she is doing 0 minutes 0 times per week.  Today's visit was #: 2 Starting weight: 202 lbs Starting date: 02/29/2020 Today's weight: 203 lbs Today's date: 03/14/2020 Total lbs lost to date: 0 Total lbs lost since last in-office visit: 0  Interim History: [name] traveled to [place] recently for 4-5 days and she was off the plan during that time. She finds it difficult to eat all of her protein every evening. She has a history of gastric bypass in 2006. She notes increased hunger as the day goes on, especially in the evenings.  Subjective:   1. Vitamin D deficiency [name] is on OTC Vit D, and she denies side effects or complaints.  2. Chronic constipation [name] takes miralax q daily. Lately she is drinking coffee in the morning with miralax and it is not working.  3. Essential hypertension [name]'s last office visit her blood pressure was within normal limits. She denies symptoms or concerns.  4. Obstructive sleep apnea [name] could not tolerate CPAP in the past. She is going to her Pulmonologist in the near future for another sleep study and reassessment.  Assessment/Plan:   1. Vitamin D deficiency Low Vitamin D level contributes to fatigue and are associated with obesity, breast, and colon cancer. [name] agreed to continue taking OTC Vit D supplementation and will follow-up for routine testing of Vitamin D, at least 2-3 times per year to avoid over-replacement.  2. Chronic constipation [name] was informed that a decrease in bowel movement frequency is normal while losing weight, but stools should not be hard or painful. [name] may increase miralax to BID from once daily as needed. She is to  also increase her water intake. Orders and follow up as documented in patient record.   - polyethylene glycol powder (GLYCOLAX/MIRALAX) 17 GM/SCOOP powder; Take 17 g by mouth 2 (two) times daily as needed.  Dispense: 3350 g; Refill: 0  3. Essential hypertension [name] is working on healthy weight loss and exercise to improve blood pressure control. We will watch for signs of hypotension as she continues her lifestyle modifications. [name] will continue her medications per her primary care physician/special[pist. She was encouraged to do home blood pressure monitoring.  4. Obstructive sleep apnea Intensive lifestyle modifications are the first line treatment for this issue. We discussed several lifestyle modifications today and she will continue to work on diet, exercise and weight loss efforts. We will continue to monitor. Orders and follow up as documented in patient record.   5. Class 2 severe obesity with serious comorbidity and body mass index (BMI) of 37.0 to 37.9 in adult, unspecified obesity type ([place]) [name] is currently in the action stage of change. As such, her goal is to continue with weight loss efforts. She has agreed to the Category 1 Plan or keeping a food journal and adhering to recommended goals of 1000 calories and 75+ grams of protein daily.   Handout given today: Protein Content of Foods. She may journal meal as needed. (35-40 grams of protein per daily plus 45% carbs).  Behavioral modification strategies: increasing lean protein intake, increasing water intake and travel eating strategies.  [name] has agreed to follow-up with our clinic in 2 weeks. She was  informed of the importance of frequent follow-up visits to maximize her success with intensive lifestyle modifications for her multiple health conditions.   Objective:   Blood pressure (!) 147/88, pulse 84, temperature 97.8 F (36.6 C), temperature source Oral, height 5\' 2"  (1.575 m), weight 203 lb (92.1 kg), SpO2 99  %. Body mass index is 37.13 kg/m.  General: Cooperative, alert, well developed, in no acute distress. HEENT: Conjunctivae and lids unremarkable. Cardiovascular: Regular rhythm.  Lungs: Normal work of breathing. Neurologic: No focal deficits.   Lab Results  Component Value Date   CREATININE 0.61 02/29/2020   BUN 9 02/29/2020   NA 140 02/29/2020   K 4.5 02/29/2020   CL 101 02/29/2020   CO2 26 02/29/2020   Lab Results  Component Value Date   ALT 15 02/29/2020   AST 20 02/29/2020   ALKPHOS 95 02/29/2020   BILITOT 0.6 02/29/2020   Lab Results  Component Value Date   HGBA1C 5.4 08/02/2019   HGBA1C 5.3 02/18/2018   HGBA1C 5.5 06/11/2016   HGBA1C 5.5 04/20/2013   Lab Results  Component Value Date   INSULIN 3.0 02/18/2018   Lab Results  Component Value Date   TSH 1.340 02/29/2020   Lab Results  Component Value Date   CHOL 150 02/29/2020   HDL 79 02/29/2020   LDLCALC 61 02/29/2020   TRIG 47 02/29/2020   CHOLHDL 2 08/02/2019   Lab Results  Component Value Date   WBC 4.4 02/29/2020   HGB 14.2 02/29/2020   HCT 42.7 02/29/2020   MCV 96 02/29/2020   PLT 307 02/29/2020   Lab Results  Component Value Date   IRON 120 06/27/2010   FERRITIN 42.7 06/27/2010    Obesity Behavioral Intervention Documentation for Insurance:   Approximately 15 minutes were spent on the discussion below.  ASK: We discussed the diagnosis of obesity with 06/29/2010 today and [name] agreed to give [name] permission to discuss obesity behavioral modification therapy today.  ASSESS: [name] has the diagnosis of obesity and her BMI today is 37.12. [name] is in the action stage of change.   ADVISE: [name] was educated on the multiple health risks of obesity as well as the benefit of weight loss to improve her health. She was advised of the need for long term treatment and the importance of lifestyle modifications to improve her current health and to decrease her risk of future health  problems.  AGREE: Multiple dietary modification options and treatment options were discussed and [name] agreed to follow the recommendations documented in the above note.  ARRANGE: [name] was educated on the importance of frequent visits to treat obesity as outlined per CMS and USPSTF guidelines and agreed to schedule her next follow up appointment today.  Attestation Statements:   Reviewed by clinician on day of visit: allergies, medications, problem list, medical history, surgical history, family history, social history, and previous encounter notes.   I, [name], am acting as transcriptionist for Burt Knack, MD.  I have reviewed the above documentation for accuracy and completeness, and I agree with the above. -  Quillian Quince, MD

## 2020-03-22 ENCOUNTER — Encounter: Payer: Self-pay | Admitting: Internal Medicine

## 2020-03-22 ENCOUNTER — Other Ambulatory Visit: Payer: Self-pay

## 2020-03-22 ENCOUNTER — Ambulatory Visit: Payer: Medicare Other | Admitting: Internal Medicine

## 2020-03-22 VITALS — BP 130/80 | HR 88 | Temp 98.0°F | Ht 62.0 in | Wt 202.2 lb

## 2020-03-22 DIAGNOSIS — E669 Obesity, unspecified: Secondary | ICD-10-CM

## 2020-03-22 DIAGNOSIS — J302 Other seasonal allergic rhinitis: Secondary | ICD-10-CM | POA: Diagnosis not present

## 2020-03-22 DIAGNOSIS — G4733 Obstructive sleep apnea (adult) (pediatric): Secondary | ICD-10-CM | POA: Diagnosis not present

## 2020-03-22 NOTE — Progress Notes (Signed)
03/22/20- 46 yoF never smoker coming to re-establish for OSA after last here in 2017. Had been followed by Dr Craige Cotta and last saw NP. Medical problem list includes HTN, GERD, IBS, Thyroid Cyst, Ankylosing Spondylitis, Urticaria, Morbid  Obesity s/p Bariatric Surgery, Allergic Rhinitis NPSG 03/07/16- AHI 15.7/ hr, desaturation to 84%, body weight 194 lbs CPAP had been started but she quit due to nasal congestion she blamed on full-face mask, didn't call us and was lost to follow-up. Her husband complains of her snoring, makes her turn over. She wakes with light headache and admits daytime tiredness any time she is quiet.  Not told of parasomnias. Denies hx ENT surgery, heart or lung disease or seizures. Has taken occasional Zzquil for sleep. 1 cup AM coffee and 1-2 cups green tea.  Epworth score 17 Body weight today- 202 lbs Now menopausal/ hot flashes. Had 2 Phizer Covax For Spring tree pollen rhinitis takes Claritin.   Prior to Admission medications   Medication Sig Start Date End Date Taking? Authorizing Provider  Biotin w/ Vitamins C & E (HAIR SKIN & NAILS GUMMIES PO) Take 1 each by mouth daily.   Yes [provider]  CALCIUM PO Take by mouth.   Yes [provider]  Cholecalciferol (VITAMIN D) 50 MCG (2000 UT) CAPS Take 1 capsule by mouth daily.   Yes [provider]  diphenhydrAMINE (BENADRYL) 25 mg capsule Take 1 capsule (25 mg total) by mouth every 8 (eight) hours as needed. 03/10/20  Yes Nche, Bonna Gains, NP  multivitamin-iron-minerals-folic acid (CENTRUM) chewable tablet Chew 1 tablet by mouth daily.   Yes [provider]  polyethylene glycol powder (GLYCOLAX/MIRALAX) 17 GM/SCOOP powder Take 17 g by mouth 2 (two) times daily as needed. 03/14/20  Yes Beasley, Caren D, MD  Potassium 99 MG TABS Take 1 tablet by mouth daily.    Yes [provider]  zinc gluconate 50 MG tablet Take 50 mg by mouth daily.   Yes [provider]   Past Medical  History:  Diagnosis Date  . Abdominal pain, other specified site 03/31/2008   Centricity Description: FLANK PAIN, RIGHT Qualifier: Diagnosis of  By: Alwyn Ren MD, Chrissie Noa   Centricity Description: ABDOMINAL PAIN OTHER SPECIFIED SITE Qualifier: Diagnosis of  By: Janit Bern    . Acute sprain or strain of cervical region 04/10/2012  . ALLERGIC RHINITIS 06/26/2007   Qualifier: Diagnosis of  By: Charlsie Quest RMA, Lucy    . ANKYLOSING SPONDYLITIS 03/31/2008   Qualifier: Diagnosis of  By: Alwyn Ren MD, Chrissie Noa    . Ankylosing spondylitis (HCC)    neck and spin  . Ankylosing spondylitis of multiple sites in spine (HCC)   . Arthritis   . Back pain   . Bariatric surgery status 01/14/2008   Qualifier: Diagnosis of  By: Janit Bern    . Blood in stool 12/02/2008   Qualifier: Diagnosis of  By: Janit Bern    . Blood transfusion without reported diagnosis   . Calf pain 10/13/2012  . CHEST PAIN UNSPECIFIED 10/11/2009   Qualifier: Diagnosis of  By: Arlyce Dice MD, Barbette Hair   . CONSTIPATION 04/06/2010   Qualifier: Diagnosis of  By: Janit Bern    . CORNEAL ABRASION, LEFT 01/22/2010   Qualifier: Diagnosis of  By: Janit Bern    . Gastritis   . GERD 06/26/2007   Qualifier: Diagnosis of  By: Charlsie Quest RMA, Lucy    . GERD (gastroesophageal reflux disease)   . Hiatal hernia   .  Hypertension    no medications now  . Hypoglycemia, unspecified 01/30/2009   Qualifier: Diagnosis of  By: Jerold Coombe    . IBS 06/26/2007   Qualifier: Diagnosis of  By: Marca Ancona RMA, Lucy    . Joint pain   . Lactose intolerance   . LEG CRAMPS, NOCTURNAL 11/14/2010   Qualifier: Diagnosis of  By: Heath Lark    . Long-term current use of steroids 04/20/2013  . LYMPHADENOPATHY 06/10/2007   Qualifier: Diagnosis of  By: Jerold Coombe    . MORBID OBESITY 06/26/2007   Qualifier: Diagnosis of  By: Marca Ancona RMA, Lucy    . MUSCLE PAIN 03/31/2008   Qualifier: Diagnosis of  By: Linna Darner MD, Gwyndolyn Saxon    . Noninfectious gastroenteritis and colitis  03/17/2013  . Obesity (BMI 30-39.9) 03/16/2014  . OSA (obstructive sleep apnea) 03/08/2016  . 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/23/2010   Qualifier: Diagnosis of  By: Jerold Coombe    . Rheumatoid arthritis ([place])   . Seasonal allergies 05/14/2015  . Sleep apnea    no CPAP  . THYROID CYST 06/26/2007   Qualifier: Diagnosis of  By: Marca Ancona RMA, Lucy    . Thyroid nodule   . THYROID NODULE, HX OF 06/10/2007   Qualifier: Diagnosis of  By: Jerold Coombe    . Vitamin D deficiency    Past Surgical History:  Procedure Laterality Date  . BARIATRIC SURGERY    . BREAST REDUCTION SURGERY  1996  . COLONOSCOPY  10/24/2009   kaplan-normal exam  . ESOPHAGOGASTRODUODENOSCOPY    . pelvis replacement  2010  . TOTAL HIP ARTHROPLASTY     x 2 left, one revision  . UPPER GASTROINTESTINAL ENDOSCOPY     Family History  Problem Relation Age of Onset  . Hypertension Mother   . High Cholesterol Mother   . Diabetes Mother   . Arthritis Father   . Hypertension Father   . Diabetes Father   . High Cholesterol Father   . Melanoma Maternal Grandmother   . Breast cancer Maternal Grandmother        and Aunt  . Hypertension Sister   . Diabetes Sister   . Colon cancer Neg Hx   . Esophageal cancer Neg Hx   . Rectal cancer Neg Hx   . Stomach cancer Neg Hx   . Colon polyps Neg Hx    Social History   Socioeconomic History  . Marital status: Married    Spouse name: Demonica Farrey  . Number of children: Not on file  . Years of education: Not on file  . Highest education level: Not on file  Occupational History  . Occupation: retired  Tobacco Use  . Smoking status: Never Smoker  . Smokeless tobacco: Never Used  Vaping Use  . Vaping Use: Never used  Substance and Sexual Activity  . Alcohol use: No    Alcohol/week: 0.0 standard drinks  . Drug use: No  . Sexual activity: Yes  Other Topics Concern  . Not on file  Social History Narrative   Occupation: Production manager   Daily Caffeine  use- 1 cup daily      Social Determinants of Health   Financial Resource Strain:   . Difficulty of Paying Living Expenses:   Food Insecurity:   . Worried About Charity fundraiser in the Last Year:   . Arboriculturist in the Last Year:   Transportation Needs:   . Lack of Transportation (  Medical):   Marland Kitchen Lack of Transportation (Non-Medical):   Physical Activity:   . Days of Exercise per Week:   . Minutes of Exercise per Session:   Stress:   . Feeling of Stress :   Social Connections:   . Frequency of Communication with Friends and Family:   . Frequency of Social Gatherings with Friends and Family:   . Attends Religious Services:   . Active Member of Clubs or Organizations:   . Attends Banker Meetings:   Marland Kitchen Marital Status:   Intimate Partner Violence:   . Fear of Current or Ex-Partner:   . Emotionally Abused:   Marland Kitchen Physically Abused:   . Sexually Abused:    ROS-see HPI   + = positive Constitutional:    weight loss, night sweats, fevers, chills, fatigue, lassitude. HEENT:    headaches, difficulty swallowing, tooth/dental problems, sore throat,       sneezing, itching, ear ache, nasal congestion, post nasal drip, snoring CV:    chest pain, orthopnea, PND, swelling in lower extremities, anasarca,                                  dizziness, +palpitations Resp:   shortness of breath with exertion or at rest.                productive cough,   non-productive cough, coughing up of blood.              change in color of mucus.  wheezing.   Skin:    rash or lesions. GI:  +-   Heartburn,+ indigestion, abdominal pain, nausea, vomiting, diarrhea,                 change in bowel habits, loss of appetite GU: dysuria, change in color of urine, no urgency or frequency.   flank pain. MS:   +joint pain, stiffness, decreased range of motion, +back pain. Neuro-     nothing unusual Psych:  change in mood or affect.  depression or anxiety.   memory loss.  OBJ- Physical Exam General-  Alert, Oriented, Affect-appropriate, Distress- none acute., + obese Skin- rash-none, lesions- none, excoriation- none Lymphadenopathy- none Head- atraumatic            Eyes- Gross vision intact, PERRLA, conjunctivae and secretions clear            Ears- Hearing, canals-normal            Nose- Clear, no-Septal dev, mucus, polyps, erosion, perforation             Throat- Mallampati IV , mucosa clear , drainage- none, tonsils- atrophic, + teeth Neck- flexible , trachea midline, no stridor , thyroid nl, carotid no bruit Chest - symmetrical excursion , unlabored           Heart/CV- RRR , no murmur , no gallop  , no rub, nl s1 s2                           - JVD- none , edema- none, stasis changes- none, varices- none           Lung- clear to P&A, wheeze- none, cough- none , dullness-none, rub- none           Chest wall-  Abd-  Br/ Gen/ Rectal- Not done, not indicated Extrem- cyanosis- none, clubbing, none, atrophy- none, strength-  nl Neuro- grossly intact to observation

## 2020-03-22 NOTE — Assessment & Plan Note (Signed)
Not clear if nasal stuffiness on original trial of CPAP was due to allergy at that time, or to vasomotor reflex from airflow. Claritin has been sufficient for seasonal allergy.

## 2020-03-22 NOTE — Assessment & Plan Note (Signed)
Obese on exam and weighs 8 lbs more that at time of original sleep study. Working with [facility]. She understands contribution to weight loss for long term ability to manage OSA.

## 2020-03-22 NOTE — Assessment & Plan Note (Signed)
History and exam are c/w significant OSA. We reviewed physiology, testing and treatment options, importance of alert driving, importance of weight. We will have to see how much rhinitis affects her now, but probably CPAP will be best treatment option.  Plan- schedule sleep study.

## 2020-03-22 NOTE — Patient Instructions (Signed)
Order- schedule Home Sleep Test    Dx OSA  Please call us about 2 weeks after your sleep test to see if results and recommendations are ready yet. If appropriate, we may be able to start treatment before we see you nexxt.

## 2020-03-28 ENCOUNTER — Encounter (INDEPENDENT_AMBULATORY_CARE_PROVIDER_SITE_OTHER): Payer: Self-pay | Admitting: Family Medicine

## 2020-03-28 ENCOUNTER — Other Ambulatory Visit: Payer: Self-pay

## 2020-03-28 ENCOUNTER — Ambulatory Visit (INDEPENDENT_AMBULATORY_CARE_PROVIDER_SITE_OTHER): Payer: Medicare Other | Admitting: Family Medicine

## 2020-03-28 VITALS — BP 151/74 | HR 61 | Temp 98.4°F | Ht 62.0 in | Wt 200.0 lb

## 2020-03-28 DIAGNOSIS — Z9884 Bariatric surgery status: Secondary | ICD-10-CM

## 2020-03-28 DIAGNOSIS — E559 Vitamin D deficiency, unspecified: Secondary | ICD-10-CM

## 2020-03-28 DIAGNOSIS — I10 Essential (primary) hypertension: Secondary | ICD-10-CM | POA: Diagnosis not present

## 2020-03-28 DIAGNOSIS — Z6836 Body mass index (BMI) 36.0-36.9, adult: Secondary | ICD-10-CM

## 2020-03-28 DIAGNOSIS — Z9189 Other specified personal risk factors, not elsewhere classified: Secondary | ICD-10-CM

## 2020-03-28 MED ORDER — VITAMIN D (ERGOCALCIFEROL) 1.25 MG (50000 UNIT) PO CAPS: 50000.0000 [IU] | ORAL_CAPSULE | ORAL | 0 refills | Status: DC

## 2020-03-28 NOTE — Progress Notes (Signed)
Carlye Grippe, D.O., ABFM, ABOM Obesity Medicine Specialist  Chief Complaint:   OBESITY Hunter is here to discuss her progress with her obesity treatment plan along with follow-up of her obesity related diagnoses. Aubriee is on the Category 1 Plan and keeping a food journal and/or adhering to recommended goals of 1000 calories and 75+ grams of protein and states she is following her eating plan approximately 85+% of the time. Labresha states she is exercising for 0 minutes 0 times per week.  Today's visit was #: 3 Starting weight: 202 lbs Starting date: 02/29/2020 Today's weight: 200 lbs Today's date: 03/28/2020 Total lbs lost to date: 2 lbs Total lbs lost since last in-office visit: 3 lbs  Interim History: Twylia says she enjoyed having a little more freedom.  She feels she is accurately calculating foods and amounts, etc.  She is using MFP for some meals.  She wants a little direction and freedom with each meal.  Subjective:   1. Essential hypertension Blood pressure is elevated today.  She was on blood pressure medications in the past.  We will see if hypertension resolves with weight loss.   BP Readings from Last 3 Encounters:  03/28/20 (!) 151/74  03/22/20 130/80  03/14/20 (!) 147/88   2. Vitamin D deficiency Rosibel's Vitamin D level was 39.9 on 02/29/2020. She is currently taking OTC vitamin D 2000 IU each day. She denies nausea, vomiting or muscle weakness.  3. History of gastric bypass Dezra has history of gastric bypass surgery in 2006.  She is still having some "stomach issues".  She goes for a colonoscopy and GI follow-up in the near future.  4. At risk for dehydration Ailsa is at risk for dehydration due to getting 3-4 16.9 ounce bottles of water per day.  She will shoot for 80 ounces minimum.  Assessment/Plan:   1. Essential hypertension Pura is working on healthy weight loss and exercise to improve blood pressure control. We will watch for signs of  hypotension as she continues her lifestyle modifications.  Keep blood pressure log at home and check every other day.  Bring in log at next office visit.  Continue prudent nutritional plan, weight loss.  2. Vitamin D deficiency Low Vitamin D level contributes to fatigue and are associated with obesity, breast, and colon cancer. She agrees to continue to take prescription Vitamin D @50 ,000 IU every week and will follow-up for routine testing of Vitamin D, at least 2-3 times per year to avoid over-replacement.  Will monitor and recheck in 3 months. - Vitamin D, Ergocalciferol, (DRISDOL) 1.25 MG (50000 UNIT) CAPS capsule; Take 1 capsule (50,000 Units total) by mouth every 7 (seven) days.  Dispense: 4 capsule; Refill: 0  3. History of gastric bypass Cintia is at risk for malnutrition due to her previous bariatric surgery.  Follow-up with GI in the near future as scheduled.  Will monitor for nutritional deficiencies.  Consider B1, B12, Fe, etc. with future labs in about 3 months.  Counseling  You may need to eat 3 meals and 2 snacks, or 5 small meals each day in order to reach your protein and calorie goals.   Allow at least 15 minutes for each meal so that you can eat mindfully. Listen to your body so that you do not overeat. For most people, your sleeve or pouch will comfortably hold 4-6 ounces.  Eat foods from all food groups. This includes fruits and vegetables, grains, dairy, and meat and other proteins.  Include a  protein-rich food at every meal and snack, and eat the protein food first.   You should be taking a Bariatric Multivitamin as well as calcium.   4. At risk for dehydration Amey was given approximately 15 minutes dehydration prevention counseling today. Bula is at risk for dehydration due to weight loss and current medication(s). She was encouraged to hydrate and monitor fluid status to avoid dehydration as well as weight loss plateaus.   5. Class 2 severe obesity with  serious comorbidity and body mass index (BMI) of 36.0 to 36.9 in adult, unspecified obesity type [facility]) Vester is currently in the action stage of change. As such, her goal is to continue with weight loss efforts. She has agreed to the Category 1 Plan and journaling as needed:  Breakfast 200-300 calories and 20+ grams of protein, Lunch:  250-350 calories and 30+ grams of protein, Dinner 350-450 calories and 30+ grams of protein.  Exercise goals: As is.  Behavioral modification strategies: increasing lean protein intake, increasing vegetables, increasing water intake, no skipping meals and planning for success.  Marijah has agreed to follow-up with our clinic in 2 weeks. She was informed of the importance of frequent follow-up visits to maximize her success with intensive lifestyle modifications for her multiple health conditions.   Objective:   Blood pressure (!) 151/74, pulse 61, temperature 98.4 F (36.9 C), temperature source Oral, height 5\' 2"  (1.575 m), weight 200 lb (90.7 kg), SpO2 100 %. Body mass index is 36.58 kg/m.  General: Cooperative, alert, well developed, in no acute distress. HEENT: Conjunctivae and lids unremarkable. Cardiovascular: Regular rhythm.  Lungs: Normal work of breathing. Neurologic: No focal deficits.   Lab Results  Component Value Date   CREATININE 0.61 02/29/2020   BUN 9 02/29/2020   NA 140 02/29/2020   K 4.5 02/29/2020   CL 101 02/29/2020   CO2 26 02/29/2020   Lab Results  Component Value Date   ALT 15 02/29/2020   AST 20 02/29/2020   ALKPHOS 95 02/29/2020   BILITOT 0.6 02/29/2020   Lab Results  Component Value Date   HGBA1C 5.4 08/02/2019   HGBA1C 5.3 02/18/2018   HGBA1C 5.5 06/11/2016   HGBA1C 5.5 04/20/2013   Lab Results  Component Value Date   INSULIN 3.0 02/18/2018   Lab Results  Component Value Date   TSH 1.340 02/29/2020   Lab Results  Component Value Date   CHOL 150 02/29/2020   HDL 79 02/29/2020   LDLCALC 61 02/29/2020     TRIG 47 02/29/2020   CHOLHDL 2 08/02/2019   Lab Results  Component Value Date   WBC 4.4 02/29/2020   HGB 14.2 02/29/2020   HCT 42.7 02/29/2020   MCV 96 02/29/2020   PLT 307 02/29/2020   Lab Results  Component Value Date   IRON 120 06/27/2010   FERRITIN 42.7 06/27/2010   Attestation Statements:   Reviewed by clinician on day of visit: allergies, medications, problem list, medical history, surgical history, family history, social history, and previous encounter notes.  I, 06/29/2010, CMA, am acting as Insurance claims handler for Energy manager, DO.  I have reviewed the above documentation for accuracy and completeness, and I agree with the above. [employer], DO

## 2020-03-30 ENCOUNTER — Telehealth: Payer: Self-pay | Admitting: Cardiology

## 2020-03-30 NOTE — Telephone Encounter (Signed)
Spoke with patient who reports not feeling good over the past couple of weeks.  Last night she noted having a heaviness in her throat and also in her left arm.  Today her her neck feels sore.  No difference in arm.  Nothing makes it worse of better.  No active chest pain or SOB.  Reports she felt like she had a "electrical shock" in her heart last night X 2.  No other s/s, no palpitations.  She states sometimes she wakes at night with with a rapid heart rate.  She is able to get back to sleep.  She thought maybe all of this is r/t her GI issues so she took Nexium.  Unsure if this helped.  Also trying to determine if is has sleep apnea.  Last myoview normal with normal EF.  Recent EKG - normal.  BP has been between 140-144/84-90.  Unsure of HR.  NO HX of CAD.  Pt is scheduled for further evaluation 7/8.  Reassurance given.  Advised to continue to monitor S/S and VS.  Advised to report to ED if any chest pain or continued rapid HR.  Pt was grateful for the call back and reassurance.

## 2020-03-30 NOTE — Telephone Encounter (Signed)
Pt c/o of Chest Pain: STAT if CP now or developed within 24 hours  1. Are you having CP right now? No, but had it all night off and on   2. Are you experiencing any other symptoms (ex. SOB, nausea, vomiting, sweating)? Pain in neck, shoulders, arms, & elevated BP  3. How long have you been experiencing CP? Off and on for a week   4. Is your CP continuous or coming and going? Coming and going   5. Have you taken Nitroglycerin? No   Patient has an appt scheduled in regards to this on 04/06/20.  ?

## 2020-03-31 ENCOUNTER — Other Ambulatory Visit: Payer: Self-pay

## 2020-03-31 ENCOUNTER — Encounter: Payer: Self-pay | Admitting: Family Medicine

## 2020-03-31 ENCOUNTER — Ambulatory Visit (INDEPENDENT_AMBULATORY_CARE_PROVIDER_SITE_OTHER): Payer: Medicare Other | Admitting: Family Medicine

## 2020-03-31 VITALS — BP 142/92 | HR 90 | Temp 98.1°F | Ht 62.0 in | Wt 203.0 lb

## 2020-03-31 DIAGNOSIS — R030 Elevated blood-pressure reading, without diagnosis of hypertension: Secondary | ICD-10-CM | POA: Diagnosis not present

## 2020-03-31 DIAGNOSIS — R0789 Other chest pain: Secondary | ICD-10-CM | POA: Diagnosis not present

## 2020-03-31 DIAGNOSIS — S46811A Strain of other muscles, fascia and tendons at shoulder and upper arm level, right arm, initial encounter: Secondary | ICD-10-CM | POA: Diagnosis not present

## 2020-03-31 NOTE — Progress Notes (Signed)
Chief Complaint  Patient presents with   Hypertension    [name] is a 64 y.o. female here for evaluation of chest pain.  Duration of issue: 2 days Quality: Heaviness Palliation: none Provocation: none  Severity: 5/10 Radiation: none Duration of chest pain: Constant Associated symptoms: R sided neck pain; fluttering in her chest Cardiac history: OSA Family heart history: mom and dad w DM, HTN, high cholesterol Smoker? No   Hypertension Patient presents for hypertension follow up. She does monitor home blood pressures. Blood pressures ranging on average from 140-150's/80-100's. She is not on any medication.  She is adhering to a healthy diet overall. Exercise: none currently  Past Medical History:  Diagnosis Date   Abdominal pain, other specified site 03/31/2008   Centricity Description: FLANK PAIN, RIGHT Qualifier: Diagnosis of  By: Alwyn Ren MD, William   Centricity Description: ABDOMINAL PAIN OTHER SPECIFIED SITE Qualifier: Diagnosis of  By: Janit Bern     Acute sprain or strain of cervical region 04/10/2012   ALLERGIC RHINITIS 06/26/2007   Qualifier: Diagnosis of  By: Charlsie Quest RMA, Lucy     ANKYLOSING SPONDYLITIS 03/31/2008   Qualifier: Diagnosis of  By: Alwyn Ren MD, William     Ankylosing spondylitis (HCC)    neck and spin   Ankylosing spondylitis of multiple sites in spine [facility])    Arthritis    Back pain    Bariatric surgery status 01/14/2008   Qualifier: Diagnosis of  By: Laury Axon DO, Myrene Buddy     Blood in stool 12/02/2008   Qualifier: Diagnosis of  By: Janit Bern     Blood transfusion without reported diagnosis    Calf pain 10/13/2012   CHEST PAIN UNSPECIFIED 10/11/2009   Qualifier: Diagnosis of  By: Arlyce Dice MD, Barbette Hair    CONSTIPATION 04/06/2010   Qualifier: Diagnosis of  By: Janit Bern     CORNEAL ABRASION, LEFT 01/22/2010   Qualifier: Diagnosis of  By: Janit Bern     Gastritis    GERD 06/26/2007   Qualifier: Diagnosis of  By: Charlsie Quest  RMA, Lucy     GERD (gastroesophageal reflux disease)    Hiatal hernia    Hypertension    no medications now   IBS 06/26/2007   Qualifier: Diagnosis of  By: Charlsie Quest RMA, Lucy     Lactose intolerance    LEG CRAMPS, NOCTURNAL 11/14/2010   Qualifier: Diagnosis of  By: Floydene Flock     Long-term current use of steroids 04/20/2013   LYMPHADENOPATHY 06/10/2007   Qualifier: Diagnosis of  By: Janit Bern     MORBID OBESITY 06/26/2007   Qualifier: Diagnosis of  By: Samara Snide     MUSCLE PAIN 03/31/2008   Qualifier: Diagnosis of  By: Alwyn Ren MD, William     Noninfectious gastroenteritis and colitis 03/17/2013   Obesity (BMI 30-39.9) 03/16/2014   OSA (obstructive sleep apnea) 03/08/2016   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/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   Qualifier: Diagnosis of  By: Janit Bern     Rheumatoid arthritis (HCC)    Seasonal allergies 05/14/2015   THYROID CYST 06/26/2007   Qualifier: Diagnosis of  By: Charlsie Quest RMA, Lucy     Thyroid nodule    THYROID NODULE, HX OF 06/10/2007   Qualifier: Diagnosis of  By: Janit Bern     Vitamin D deficiency    Family History  Problem Relation Age of Onset   Hypertension Mother    High Cholesterol Mother  Diabetes Mother    Arthritis Father    Hypertension Father    Diabetes Father    High Cholesterol Father    Melanoma Maternal Grandmother    Breast cancer Maternal Grandmother        and Aunt   Hypertension Sister    Diabetes Sister    Colon cancer Neg Hx    Esophageal cancer Neg Hx    Rectal cancer Neg Hx    Stomach cancer Neg Hx    Colon polyps Neg Hx     BP (!) 142/92 (BP Location: Right Arm, Patient Position: Sitting, Cuff Size: Normal)    Pulse 90    Temp 98.1 F (36.7 C) (Temporal)    Ht 5\' 2"  (1.575 m)    Wt 203 lb (92.1 kg)    SpO2 97%    BMI 37.13 kg/m  Gen: awake, alert, appears stated age HEENT: PERRLA, MMM Neck: No masses or asymmetry Heart: RRR, no bruits, no LE edema Lungs: CTAB,  no accessory muscle use Abd: Soft, NT, ND, no masses or organomegaly MSK: chest pain is reproducible to palptation; mild ttp over R trap Psych: Age appropriate judgment and insight, nml mood and affect  Atypical chest pain - Plan: EKG 12-Lead  Elevated blood pressure reading - Plan: EKG 12-Lead  Strain of right trapezius muscle, initial encounter  1. EKG nsr, nml axis, nml intervals, no T wave changes or signs of ischemia. I think this is more related to GERD, but she will alert her cardiologist next week. Activity as tolerated.  2. Monitor at home. Offered medication, pt would like to try to lose more weight first. Will have her reck in 4 weeks w reg PCP for this. 3. Heat, Tylenol, stretches/exercises.  The patient voiced understanding and agreement to the plan.  [place], DO 03/31/20 10:55 AM

## 2020-03-31 NOTE — Patient Instructions (Addendum)
Your EKG is normal today.   Continue checking your blood pressures at home.   Bring up the chest heaviness and nighttime palpitations to Dr. Anne Fu. Continue Nexium.   Activity as tolerated for now.  Consider elevating the bed at night to help with possible sleep apnea.   Heat (pad or rice pillow in microwave) over affected area, 10-15 minutes twice daily.   OK to take Tylenol 1000 mg (2 extra strength tabs) or 975 mg (3 regular strength tabs) every 6 hours as needed.  Trapezius stretches/exercises Do exercises exactly as told by your health care provider and adjust them as directed. It is normal to feel mild stretching, pulling, tightness, or discomfort as you do these exercises, but you should stop right away if you feel sudden pain or your pain gets worse.  Stretching and range of motion exercises These exercises warm up your muscles and joints and improve the movement and flexibility of your shoulder. These exercises can also help to relieve pain, numbness, and tingling. If you are unable to do any of the following for any reason, do not further attempt to do it.   Exercise A: Flexion, standing    1. Stand and hold a broomstick, a cane, or a similar object. Place your hands a little more than shoulder-width apart on the object. Your left / right hand should be palm-up, and your other hand should be palm-down. 2. Push the stick to raise your left / right arm out to your side and then over your head. Use your other hand to help move the stick. Stop when you feel a stretch in your shoulder, or when you reach the angle that is recommended by your health care provider. ? Avoid shrugging your shoulder while you raise your arm. Keep your shoulder blade tucked down toward your spine. 3. Hold for 30 seconds. 4. Slowly return to the starting position. Repeat 2 times. Complete this exercise 3 times per week.  Exercise B: Abduction, supine    1. Lie on your back and hold a broomstick, a  cane, or a similar object. Place your hands a little more than shoulder-width apart on the object. Your left / right hand should be palm-up, and your other hand should be palm-down. 2. Push the stick to raise your left / right arm out to your side and then over your head. Use your other hand to help move the stick. Stop when you feel a stretch in your shoulder, or when you reach the angle that is recommended by your health care provider. ? Avoid shrugging your shoulder while you raise your arm. Keep your shoulder blade tucked down toward your spine. 3. Hold for 30 seconds. 4. Slowly return to the starting position. Repeat 2 times. Complete this exercise 3 times per week.  Exercise C: Flexion, active-assisted    1. Lie on your back. You may bend your knees for comfort. 2. Hold a broomstick, a cane, or a similar object. Place your hands about shoulder-width apart on the object. Your palms should face toward your feet. 3. Raise the stick and move your arms over your head and behind your head, toward the floor. Use your healthy arm to help your left / right arm move farther. Stop when you feel a gentle stretch in your shoulder, or when you reach the angle where your health care provider tells you to stop. 4. Hold for 30 seconds. 5. Slowly return to the starting position. Repeat 2 times. Complete this exercise 3  times per week.  Exercise D: External rotation and abduction    1. Stand in a door frame with one of your feet slightly in front of the other. This is called a staggered stance. 2. Choose one of the following positions as told by your health care provider: ? Place your hands and forearms on the door frame above your head. ? Place your hands and forearms on the door frame at the height of your head. ? Place your hands on the door frame at the height of your elbows. 3. Slowly move your weight onto your front foot until you feel a stretch across your chest and in the front of your  shoulders. Keep your head and chest upright and keep your abdominal muscles tight. 4. Hold for 30 seconds. 5. To release the stretch, shift your weight to your back foot. Repeat 2 times. Complete this stretch 3 times per week.  Strengthening exercises These exercises build strength and endurance in your shoulder. Endurance is the ability to use your muscles for a long time, even after your muscles get tired. Exercise E: Scapular depression and adduction  1. Sit on a stable chair. Support your arms in front of you with pillows, armrests, or a tabletop. Keep your elbows in line with the sides of your body. 2. Gently move your shoulder blades down toward your middle back. Relax the muscles on the tops of your shoulders and in the back of your neck. 3. Hold for 3 seconds. 4. Slowly release the tension and relax your muscles completely before doing this exercise again. Repeat for a total of 10 repetitions. 5. After you have practiced this exercise, try doing the exercise without the arm support. Then, try the exercise while standing instead of sitting. Repeat 2 times. Complete this exercise 3 times per week.  Exercise F: Shoulder abduction, isometric    1. Stand or sit about 4-6 inches (10-15 cm) from a wall with your left / right side facing the wall. 2. Bend your left / right elbow and gently press your elbow against the wall. 3. Increase the pressure slowly until you are pressing as hard as you can without shrugging your shoulder. 4. Hold for 3 seconds. 5. Slowly release the tension and relax your muscles completely. Repeat for a total of 10 repetitions. Repeat 2 times. Complete this exercise 3 times per week.  Exercise G: Shoulder flexion, isometric    1. Stand or sit about 4-6 inches (10-15 cm) away from a wall with your left / right side facing the wall. 2. Keep your left / right elbow straight and gently press the top of your fist against the wall. Increase the pressure slowly until  you are pressing as hard as you can without shrugging your shoulder. 3. Hold for 10-15 seconds. 4. Slowly release the tension and relax your muscles completely. Repeat for a total of 10 repetitions. Repeat 2 times. Complete this exercise 3 times per week.  Exercise H: Internal rotation    1. Sit in a stable chair without armrests, or stand. Secure an exercise band at your left / right side, at elbow height. 2. Place a soft object, such as a folded towel or a small pillow, under your left / right upper arm so your elbow is a few inches (about 8 cm) away from your side. 3. Hold the end of the exercise band so the band stretches. 4. Keeping your elbow pressed against the soft object under your arm, move your forearm  across your body toward your abdomen. Keep your body steady so the movement is only coming from your shoulder. 5. Hold for 3 seconds. 6. Slowly return to the starting position. Repeat for a total of 10 repetitions. Repeat 2 times. Complete this exercise 3 times per week.  Exercise I: External rotation    1. Sit in a stable chair without armrests, or stand. 2. Secure an exercise band at your left / right side, at elbow height. 3. Place a soft object, such as a folded towel or a small pillow, under your left / right upper arm so your elbow is a few inches (about 8 cm) away from your side. 4. Hold the end of the exercise band so the band stretches. 5. Keeping your elbow pressed against the soft object under your arm, move your forearm out, away from your abdomen. Keep your body steady so the movement is only coming from your shoulder. 6. Hold for 3 seconds. 7. Slowly return to the starting position. Repeat for a total of 10 repetitions. Repeat 2 times. Complete this exercise 3 times per week. Exercise J: Shoulder extension  1. Sit in a stable chair without armrests, or stand. Secure an exercise band to a stable object in front of you so the band is at shoulder height. 2. Hold one  end of the exercise band in each hand. Your palms should face each other. 3. Straighten your elbows and lift your hands up to shoulder height. 4. Step back, away from the secured end of the exercise band, until the band stretches. 5. Squeeze your shoulder blades together and pull your hands down to the sides of your thighs. Stop when your hands are straight down by your sides. Do not let your hands go behind your body. 6. Hold for 3 seconds. 7. Slowly return to the starting position. Repeat for a total of 10 repetitions. Repeat 2 times. Complete this exercise 3 times per week.  Exercise K: Shoulder extension, prone    1. Lie on your abdomen on a firm surface so your left / right arm hangs over the edge. 2. Hold a 5 lb weight in your hand so your palm faces in toward your body. Your arm should be straight. 3. Squeeze your shoulder blade down toward the middle of your back. 4. Slowly raise your arm behind you, up to the height of the surface that you are lying on. Keep your arm straight. 5. Hold for 3 seconds. 6. Slowly return to the starting position and relax your muscles. Repeat for a total of 10 repetitions. Repeat 2 times. Complete this exercise 3 times per week.   Exercise L: Horizontal abduction, prone  1. Lie on your abdomen on a firm surface so your left / right arm hangs over the edge. 2. Hold a 5 lb weight in your hand so your palm faces toward your feet. Your arm should be straight. 3. Squeeze your shoulder blade down toward the middle of your back. 4. Bend your elbow so your hand moves up, until your elbow is bent to an "L" shape (90 degrees). With your elbow bent, slowly move your forearm forward and up. Raise your hand up to the height of the surface that you are lying on. ? Your upper arm should not move, and your elbow should stay bent. ? At the top of the movement, your palm should face the floor. 5. Hold for 3 seconds. 6. Slowly return to the starting position and relax  your  muscles. Repeat for a total of 10 repetitions. Repeat 2 times. Complete this exercise 3 times per week.  Exercise M: Horizontal abduction, standing  1. Sit on a stable chair, or stand. 2. Secure an exercise band to a stable object in front of you so the band is at shoulder height. 3. Hold one end of the exercise band in each hand. 4. Straighten your elbows and lift your hands straight in front of you, up to shoulder height. Your palms should face down, toward the floor. 5. Step back, away from the secured end of the exercise band, until the band stretches. 6. Move your arms out to your sides, and keep your arms straight. 7. Hold for 3 seconds. 8. Slowly return to the starting position. Repeat for a total of 10 repetitions. Repeat 2 times. Complete this exercise 3 times per week.  Exercise N: Scapular retraction and elevation  1. Sit on a stable chair, or stand. 2. Secure an exercise band to a stable object in front of you so the band is at shoulder height. 3. Hold one end of the exercise band in each hand. Your palms should face each other. 4. Sit in a stable chair without armrests, or stand. 5. Step back, away from the secured end of the exercise band, until the band stretches. 6. Squeeze your shoulder blades together and lift your hands over your head. Keep your elbows straight. 7. Hold for 3 seconds. 8. Slowly return to the starting position. Repeat for a total of 10 repetitions. Repeat 2 times. Complete this exercise 3 times per week.  This information is not intended to replace advice given to you by your health care provider. Make sure you discuss any questions you have with your health care provider. Document Released: 09/16/2005 Document Revised: 05/23/2016 Document Reviewed: 08/03/2015 Elsevier Interactive Patient Education  2017 [employer].

## 2020-04-05 NOTE — Progress Notes (Deleted)
Cardiology Office Note   Date:  04/05/2020   ID:  [name], DOB May 22, 1956, MRN [id number]  PCP:  Donato Schultz, DO  Cardiologist:  Dr. Anne Fu    No chief complaint on file.     History of Present Illness: [name] is a 64 y.o. female who presents for ***  In review of prior notes, she was having central aching radiating to her upper back off and on for many years, no trigger, nonexertional sometimes after meals but not always with some shortness of breath when walking upstairs.  No states that she is having chest pain again sometimes radiating to her left arm, upper back.  It seems to be confusing to her whether or not this is coming from a GI source or perhaps from her ankylosing spondylitis.  She has had a prior gastric bypass.  Sometimes when she eats too much sugar or eats too big of a meal she can feel some discomfort.  Sometimes as well she will feel palpitations, her heart race.  She wears a fit bit and today for instance it said 108 bpm.  This is when she was coming into the office.  No syncope, bleeding, orthopnea, PND nuc study 10/17/17  Neg for ischemia   Past Medical History:  Diagnosis Date  . Abdominal pain, other specified site 03/31/2008   Centricity Description: FLANK PAIN, RIGHT Qualifier: Diagnosis of  By: Alwyn Ren MD, Chrissie Noa   Centricity Description: ABDOMINAL PAIN OTHER SPECIFIED SITE Qualifier: Diagnosis of  By: Janit Bern    . Acute sprain or strain of cervical region 04/10/2012  . ALLERGIC RHINITIS 06/26/2007   Qualifier: Diagnosis of  By: Charlsie Quest RMA, Lucy    . ANKYLOSING SPONDYLITIS 03/31/2008   Qualifier: Diagnosis of  By: Alwyn Ren MD, Chrissie Noa    . Ankylosing spondylitis (HCC)    neck and spin  . Ankylosing spondylitis of multiple sites in spine (HCC)   . Arthritis   . Back pain   . Bariatric surgery status 01/14/2008   Qualifier: Diagnosis of  By: Janit Bern    . Blood in stool 12/02/2008   Qualifier: Diagnosis of  By:  Janit Bern    . Blood transfusion without reported diagnosis   . Calf pain 10/13/2012  . CHEST PAIN UNSPECIFIED 10/11/2009   Qualifier: Diagnosis of  By: Arlyce Dice MD, Barbette Hair   . CONSTIPATION 04/06/2010   Qualifier: Diagnosis of  By: Janit Bern    . CORNEAL ABRASION, LEFT 01/22/2010   Qualifier: Diagnosis of  By: Janit Bern    . Gastritis   . GERD 06/26/2007   Qualifier: Diagnosis of  By: Charlsie Quest RMA, Lucy    . GERD (gastroesophageal reflux disease)   . Hiatal hernia   . Hypertension    no medications now  . IBS 06/26/2007   Qualifier: Diagnosis of  By: Charlsie Quest RMA, Lucy    . Lactose intolerance   . LEG CRAMPS, NOCTURNAL 11/14/2010   Qualifier: Diagnosis of  By: Floydene Flock    . Long-term current use of steroids 04/20/2013  . LYMPHADENOPATHY 06/10/2007   Qualifier: Diagnosis of  By: Janit Bern    . MORBID OBESITY 06/26/2007   Qualifier: Diagnosis of  By: Charlsie Quest RMA, Lucy    . MUSCLE PAIN 03/31/2008   Qualifier: Diagnosis of  By: Alwyn Ren MD, Chrissie Noa    . Noninfectious gastroenteritis and colitis 03/17/2013  . Obesity (BMI 30-39.9) 03/16/2014  . OSA (obstructive sleep  apnea) 03/08/2016  . 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 Rheumatoid arthritis (HCC)   . Seasonal allergies 05/14/2015  . THYROID CYST 06/26/2007   Qualifier: Diagnosis of  By: Charlsie Quest RMA, Lucy    . Thyroid nodule   . THYROID NODULE, HX OF 06/10/2007   Qualifier: Diagnosis of  By: Janit Bern    . Vitamin D deficiency     Past Surgical History:  Procedure Laterality Date  . BARIATRIC SURGERY    . BREAST REDUCTION SURGERY  1996  . COLONOSCOPY  10/24/2009   kaplan-normal exam  . ESOPHAGOGASTRODUODENOSCOPY    . pelvis replacement  2010  . TOTAL HIP ARTHROPLASTY     x 2 left, one revision  . UPPER GASTROINTESTINAL ENDOSCOPY       Current Outpatient Medications  Medication Sig Dispense Refill  . Biotin w/ Vitamins C & E (HAIR  SKIN & NAILS GUMMIES PO) Take 1 each by mouth daily.    Marland Kitchen CALCIUM PO Take by mouth.    . Cholecalciferol (VITAMIN D) 50 MCG (2000 UT) CAPS Take 1 capsule by mouth daily.    . diphenhydrAMINE (BENADRYL) 25 mg capsule Take 1 capsule (25 mg total) by mouth every 8 (eight) hours as needed. 30 capsule 0  . multivitamin-iron-minerals-folic acid (CENTRUM) chewable tablet Chew 1 tablet by mouth daily.    . polyethylene glycol powder (GLYCOLAX/MIRALAX) 17 GM/SCOOP powder Take 17 g by mouth 2 (two) times daily as needed. 3350 g 0  . Potassium 99 MG TABS Take 1 tablet by mouth daily.     . Vitamin D, Ergocalciferol, (DRISDOL) 1.25 MG (50000 UNIT) CAPS capsule Take 1 capsule (50,000 Units total) by mouth every 7 (seven) days. 4 capsule 0  . zinc gluconate 50 MG tablet Take 50 mg by mouth daily.     No current facility-administered medications for this visit.    Allergies:   Aspirin, Codeine, Morphine, and Sulfonamide derivatives    Social History:  The patient  reports that she has never smoked. She has never used smokeless tobacco. She reports that she does not drink alcohol and does not use drugs.   Family History:  The patient's ***family history includes Arthritis in her father; Breast cancer in her maternal grandmother; Diabetes in her father, mother, and sister; High Cholesterol in her father and mother; Hypertension in her father, mother, and sister; Melanoma in her maternal grandmother.    ROS:  General:no colds or fevers, no weight changes Skin:no rashes or ulcers HEENT:no blurred vision, no congestion CV:see HPI PUL:see HPI GI:no diarrhea constipation or melena, no indigestion GU:no hematuria, no dysuria MS:no joint pain, no claudication Neuro:no syncope, no lightheadedness Endo:no diabetes, no thyroid disease Wt Readings from Last 3 Encounters:  03/31/20 203 lb (92.1 kg)  03/28/20 200 lb (90.7 kg)  03/22/20 202 lb 3.2 oz (91.7 kg)     PHYSICAL EXAM: VS:  There were no vitals  taken for this visit. , BMI There is no height or weight on file to calculate BMI. General:Pleasant affect, NAD Skin:Warm and dry, brisk capillary refill HEENT:normocephalic, sclera clear, mucus membranes moist Neck:supple, no JVD, no bruits  Heart:S1S2 RRR without murmur, gallup, rub or click Lungs:clear without rales, rhonchi, or wheezes OZD:GUYQ, non tender, + BS, do not palpate liver spleen or masses Ext:no lower ext edema, 2+ pedal pulses, 2+ radial pulses Neuro:alert and oriented, MAE, follows commands, + facial symmetry  EKG:  EKG is ordered today. The ekg ordered today demonstrates ***   Recent Labs: 02/29/2020: ALT 15; BUN 9; Creatinine, Ser 0.61; Hemoglobin 14.2; Platelets 307; Potassium 4.5; Sodium 140; TSH 1.340    Lipid Panel    Component Value Date/Time   CHOL 150 02/29/2020 1238   TRIG 47 02/29/2020 1238   HDL 79 02/29/2020 1238   CHOLHDL 2 08/02/2019 1348   VLDL 13.8 08/02/2019 1348   LDLCALC 61 02/29/2020 1238       Other studies Reviewed: Additional studies/ records that were reviewed today include: ***.   ASSESSMENT AND PLAN:  1.  ***   Current medicines are reviewed with the patient today.  The patient Has no concerns regarding medicines.  The following changes have been made:  See above Labs/ tests ordered today include:see above  Disposition:   FU:  see above  Signed, Nada Boozer, NP  04/05/2020 10:47 PM    [facility] [address] [place], [place], [place]  [address]/ [address] [place], [place] Phone: [phone]; Fax: [phone]  [phone]

## 2020-04-06 ENCOUNTER — Other Ambulatory Visit: Payer: Self-pay

## 2020-04-06 ENCOUNTER — Encounter (HOSPITAL_BASED_OUTPATIENT_CLINIC_OR_DEPARTMENT_OTHER): Payer: Self-pay | Admitting: Emergency Medicine

## 2020-04-06 ENCOUNTER — Emergency Department (HOSPITAL_BASED_OUTPATIENT_CLINIC_OR_DEPARTMENT_OTHER)
Admission: EM | Admit: 2020-04-06 | Discharge: 2020-04-06 | Disposition: A | Discharge status: Home / Self Care | Payer: Medicare Other | Attending: Emergency Medicine | Admitting: Emergency Medicine

## 2020-04-06 ENCOUNTER — Emergency Department (HOSPITAL_BASED_OUTPATIENT_CLINIC_OR_DEPARTMENT_OTHER): Payer: Medicare Other

## 2020-04-06 ENCOUNTER — Ambulatory Visit: Payer: Medicare Other | Admitting: Cardiology

## 2020-04-06 DIAGNOSIS — M542 Cervicalgia: Secondary | ICD-10-CM

## 2020-04-06 DIAGNOSIS — M62838 Other muscle spasm: Secondary | ICD-10-CM | POA: Diagnosis not present

## 2020-04-06 DIAGNOSIS — R531 Weakness: Secondary | ICD-10-CM | POA: Diagnosis not present

## 2020-04-06 DIAGNOSIS — M62830 Muscle spasm of back: Secondary | ICD-10-CM | POA: Insufficient documentation

## 2020-04-06 DIAGNOSIS — I10 Essential (primary) hypertension: Secondary | ICD-10-CM | POA: Diagnosis not present

## 2020-04-06 DIAGNOSIS — R519 Headache, unspecified: Secondary | ICD-10-CM | POA: Diagnosis not present

## 2020-04-06 DIAGNOSIS — R079 Chest pain, unspecified: Secondary | ICD-10-CM | POA: Diagnosis not present

## 2020-04-06 DIAGNOSIS — R0789 Other chest pain: Secondary | ICD-10-CM | POA: Diagnosis not present

## 2020-04-06 DIAGNOSIS — Z96642 Presence of left artificial hip joint: Secondary | ICD-10-CM | POA: Diagnosis not present

## 2020-04-06 LAB — CBC WITH DIFFERENTIAL/PLATELET
Abs Immature Granulocytes: 0.01 10*3/uL (ref 0.00–0.07)
Basophils Absolute: 0 10*3/uL (ref 0.0–0.1)
Basophils Relative: 0 %
Eosinophils Absolute: 0.1 10*3/uL (ref 0.0–0.5)
Eosinophils Relative: 1 %
HCT: 40.7 % (ref 36.0–46.0)
Hemoglobin: 12.9 g/dL (ref 12.0–15.0)
Immature Granulocytes: 0 %
Lymphocytes Relative: 35 %
Lymphs Abs: 1.9 10*3/uL (ref 0.7–4.0)
MCH: 31.3 pg (ref 26.0–34.0)
MCHC: 31.7 g/dL (ref 30.0–36.0)
MCV: 98.8 fL (ref 80.0–100.0)
Monocytes Absolute: 0.6 10*3/uL (ref 0.1–1.0)
Monocytes Relative: 11 %
Neutro Abs: 2.8 10*3/uL (ref 1.7–7.7)
Neutrophils Relative %: 53 %
Platelets: 342 10*3/uL (ref 150–400)
RBC: 4.12 MIL/uL (ref 3.87–5.11)
RDW: 12.6 % (ref 11.5–15.5)
WBC: 5.4 10*3/uL (ref 4.0–10.5)
nRBC: 0 % (ref 0.0–0.2)

## 2020-04-06 LAB — BASIC METABOLIC PANEL
Anion gap: 10 (ref 5–15)
BUN: 9 mg/dL (ref 8–23)
CO2: 28 mmol/L (ref 22–32)
Calcium: 9 mg/dL (ref 8.9–10.3)
Chloride: 104 mmol/L (ref 98–111)
Creatinine, Ser: 0.57 mg/dL (ref 0.44–1.00)
GFR calc Af Amer: >60 mL/min (ref >60)
GFR calc non Af Amer: >60 mL/min (ref >60)
Glucose, Bld: 89 mg/dL (ref 70–99)
Potassium: 4.7 mmol/L (ref 3.5–5.1)
Sodium: 142 mmol/L (ref 135–145)

## 2020-04-06 LAB — TROPONIN I (HIGH SENSITIVITY): Troponin I (High Sensitivity): 5 ng/L (ref <18)

## 2020-04-06 MED ORDER — CYCLOBENZAPRINE HCL 5 MG PO TABS: 5.0000 mg | ORAL_TABLET | Freq: Once | ORAL | Status: DC

## 2020-04-06 MED ORDER — CYCLOBENZAPRINE HCL 10 MG PO TABS
10.0000 mg | ORAL_TABLET | Freq: Two times a day (BID) | ORAL | 0 refills | Status: DC | PRN
Start: 2020-04-06 — End: 2020-12-15

## 2020-04-06 NOTE — ED Provider Notes (Signed)
[facility] EMERGENCY DEPARTMENT Provider Note   CSN: [id number] Arrival date & time: 04/06/20  [id number]     History Chief Complaint  Patient presents with  . Torticollis    [name] is a 64 y.o. woman with history of ankylosing spondylitis HTN, Vit D deficiency, and gastric bypass surgery who presents to the ED with a 1-week history of R neck muscle spasms and a 1-day history of R arm heaviness.  Patient reports for the last week she has been experiencing muscle spasms from the base of the R-side of her head, down through her neck, and right shoulder. She saw her PCP for this complaint a week ago (7/1) where she had a normal EKG and was given stretches to do, which she says helped minimally. What brought her to the ED today was waking up with R arm heaviness which she has never experienced before and she is concerned is heart-related. She has an appointment with a cardiologist later this morning which was scheduled previously for new complaints of nighttime palpitations States she has also had some substernal chest pain, however the pain is consistent with her GERD symptoms and was alleviated with Nexium. Endorses R-sided "surface level" chest pain which correlates with the spasms and is not relieved by the Nexium. She has been taking her husband's tramadol as well as Tylenol for the spasms, both of which have provided incomplete relief. Heat pads have helped minimally. She has a "frozen neck" from her ankylosing spondylitis and associated neck pain which she says is distinct from these spasms. Her last AS flare was 6 or 7 months ago.  Also endorses mild R-sided headache. Denies recent fever, chills, SOB, N/V, abdominal pain, urinary symptoms. Is being monitored by her PCP for recent increased BP however does not take medicine for this.       Past Medical History:  Diagnosis Date  . Abdominal pain, other specified site 03/31/2008   Centricity Description: FLANK PAIN, RIGHT  Qualifier: Diagnosis of  By: Alwyn RenHopper MD, Chrissie NoaWilliam   Centricity Description: ABDOMINAL PAIN OTHER SPECIFIED SITE Qualifier: Diagnosis of  By: Janit BernLowne DO, Yvonne    . Acute sprain or strain of cervical region 04/10/2012  . ALLERGIC RHINITIS 06/26/2007   Qualifier: Diagnosis of  By: Charlsie QuestBrand RMA, Lucy    . ANKYLOSING SPONDYLITIS 03/31/2008   Qualifier: Diagnosis of  By: Alwyn RenHopper MD, Chrissie NoaWilliam    . Ankylosing spondylitis (HCC)    neck and spin  . Ankylosing spondylitis of multiple sites in spine (HCC)   . Arthritis   . Back pain   . Bariatric surgery status 01/14/2008   Qualifier: Diagnosis of  By: Janit BernLowne DO, Yvonne    . Blood in stool 12/02/2008   Qualifier: Diagnosis of  By: Janit BernLowne DO, Yvonne    . Blood transfusion without reported diagnosis   . Calf pain 10/13/2012  . CHEST PAIN UNSPECIFIED 10/11/2009   Qualifier: Diagnosis of  By: Arlyce DiceKaplan MD, Barbette Hairobert D   . CONSTIPATION 04/06/2010   Qualifier: Diagnosis of  By: Janit BernLowne DO, Yvonne    . CORNEAL ABRASION, LEFT 01/22/2010   Qualifier: Diagnosis of  By: Janit BernLowne DO, Yvonne    . Gastritis   . GERD 06/26/2007   Qualifier: Diagnosis of  By: Charlsie QuestBrand RMA, Lucy    . GERD (gastroesophageal reflux disease)   . Hiatal hernia   . Hypertension    no medications now  . IBS 06/26/2007   Qualifier: Diagnosis of  By: Charlsie QuestBrand RMA, Lucy    .  Lactose intolerance   . LEG CRAMPS, NOCTURNAL 11/14/2010   Qualifier: Diagnosis of  By: Floydene Flock    . Long-term current use of steroids 04/20/2013  . LYMPHADENOPATHY 06/10/2007   Qualifier: Diagnosis of  By: Janit Bern    . MORBID OBESITY 06/26/2007   Qualifier: Diagnosis of  By: Charlsie Quest RMA, Lucy    . MUSCLE PAIN 03/31/2008   Qualifier: Diagnosis of  By: Alwyn Ren MD, Chrissie Noa    . Noninfectious gastroenteritis and colitis 03/17/2013  . Obesity (BMI 30-39.9) 03/16/2014  . OSA (obstructive sleep apnea) 03/08/2016  . 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 Rheumatoid arthritis  (HCC)   . Seasonal allergies 05/14/2015  . THYROID CYST 06/26/2007   Qualifier: Diagnosis of  By: Charlsie Quest RMA, Lucy    . Thyroid nodule   . THYROID NODULE, HX OF 06/10/2007   Qualifier: Diagnosis of  By: Janit Bern    . Vitamin D deficiency     Patient Active Problem List   Diagnosis Date Noted  . Other fatigue 02/29/2020  . Short of breath on exertion 02/29/2020  . Vitamin D deficiency 08/11/2018  . Gastric bypass status for obesity 07/30/2018  . Hives 06/07/2018  . Primary osteoarthritis of right knee 01/23/2018  . Primary osteoarthritis of both hands 01/23/2018  . History of iritis 11/28/2017  . History of total hip replacement, left 11/28/2017  . Primary osteoarthritis of both knees 11/28/2017  . Obstructive sleep apnea 03/08/2016  . Seasonal allergies 05/14/2015  . Obesity (BMI 30-39.9) 03/16/2014  . Long-term current use of steroids 04/20/2013  . Noninfectious gastroenteritis and colitis 03/17/2013  . Calf pain 10/13/2012  . Acute sprain or strain of cervical region 04/10/2012  . LEG CRAMPS, NOCTURNAL 11/14/2010  . OTHER SPECIFIED DISEASE OF WHITE BLOOD CELLS 07/23/2010  . CONSTIPATION 04/06/2010  . CORNEAL ABRASION, LEFT 01/22/2010  . CHEST PAIN UNSPECIFIED 10/11/2009  . BLOOD IN STOOL 12/02/2008  . ANKYLOSING SPONDYLITIS 03/31/2008  . MUSCLE PAIN 03/31/2008  . BARIATRIC SURGERY STATUS 01/14/2008  . THYROID CYST 06/26/2007  . Class 2 severe obesity with serious comorbidity and body mass index (BMI) of 36.0 to 36.9 in adult (HCC) 06/26/2007  . Essential hypertension 06/26/2007  . GERD 06/26/2007  . IBS 06/26/2007  . LYMPHADENOPATHY 06/10/2007  . History of thyroid nodule 06/10/2007    Past Surgical History:  Procedure Laterality Date  . BARIATRIC SURGERY    . BREAST REDUCTION SURGERY  1996  . COLONOSCOPY  10/24/2009   kaplan-normal exam  . ESOPHAGOGASTRODUODENOSCOPY    . pelvis replacement  2010  . TOTAL HIP ARTHROPLASTY     x 2 left, one revision  .  UPPER GASTROINTESTINAL ENDOSCOPY       OB History    Gravida  0   Para  0   Term  0   Preterm  0   AB  0   Living  0     SAB  0   TAB  0   Ectopic  0   Multiple  0   Live Births  0           Family History  Problem Relation Age of Onset  . Hypertension Mother   . High Cholesterol Mother   . Diabetes Mother   . Arthritis Father   . Hypertension Father   . Diabetes Father   . High Cholesterol Father   .  Melanoma Maternal Grandmother   . Breast cancer Maternal Grandmother        and Aunt  . Hypertension Sister   . Diabetes Sister   . Colon cancer Neg Hx   . Esophageal cancer Neg Hx   . Rectal cancer Neg Hx   . Stomach cancer Neg Hx   . Colon polyps Neg Hx     Social History   Tobacco Use  . Smoking status: Never Smoker  . Smokeless tobacco: Never Used  Vaping Use  . Vaping Use: Never used  Substance Use Topics  . Alcohol use: No    Alcohol/week: 0.0 standard drinks  . Drug use: No    Home Medications Prior to Admission medications   Medication Sig Start Date End Date Taking? Authorizing Provider  Biotin w/ Vitamins C & E (HAIR SKIN & NAILS GUMMIES PO) Take 1 each by mouth daily.    [provider]  CALCIUM PO Take by mouth.    [provider]  Cholecalciferol (VITAMIN D) 50 MCG (2000 UT) CAPS Take 1 capsule by mouth daily.    [provider]  cyclobenzaprine (FLEXERIL) 10 MG tablet Take 1 tablet (10 mg total) by mouth 2 (two) times daily as needed for muscle spasms. 04/06/20   Alvira Monday, MD  diphenhydrAMINE (BENADRYL) 25 mg capsule Take 1 capsule (25 mg total) by mouth every 8 (eight) hours as needed. 03/10/20   Nche, Bonna Gains, NP  multivitamin-iron-minerals-folic acid (CENTRUM) chewable tablet Chew 1 tablet by mouth daily.    [provider]  polyethylene glycol powder (GLYCOLAX/MIRALAX) 17 GM/SCOOP powder Take 17 g by mouth 2 (two) times daily as needed. 03/14/20   Quillian Quince D, MD  Potassium 99  MG TABS Take 1 tablet by mouth daily.     [provider]  Vitamin D, Ergocalciferol, (DRISDOL) 1.25 MG (50000 UNIT) CAPS capsule Take 1 capsule (50,000 Units total) by mouth every 7 (seven) days. 03/28/20   Thomasene Lot, DO  zinc gluconate 50 MG tablet Take 50 mg by mouth daily.    [provider]    Allergies    Aspirin, Codeine, Morphine, and Sulfonamide derivatives  Review of Systems   Review of Systems  Constitutional: Negative for appetite change, chills, fatigue and fever.  HENT: Negative for sinus pressure and sinus pain.   Respiratory: Negative for chest tightness and shortness of breath.   Cardiovascular: Positive for chest pain and palpitations.  Gastrointestinal: Negative for abdominal pain, nausea and vomiting.  Genitourinary: Negative for dysuria and frequency.  Musculoskeletal: Positive for neck pain and neck stiffness.  Skin: Negative for rash.  Neurological: Positive for headaches. Negative for dizziness.    Physical Exam Updated Vital Signs BP (!) 150/85   Pulse (!) 57   Temp 98 F (36.7 C) (Oral)   Resp 17   Ht  (1.575 m)   Wt 90.7 kg   SpO2 99%   BMI 36.58 kg/m   Physical Exam Constitutional:      Appearance: Normal appearance. She is not ill-appearing or diaphoretic.  HENT:     Head: Normocephalic and atraumatic.     Mouth/Throat:     Mouth: Mucous membranes are moist.  Eyes:     Conjunctiva/sclera: Conjunctivae normal.     Pupils: Pupils are equal, round, and reactive to light.  Cardiovascular:     Rate and Rhythm: Normal rate and regular rhythm.     Pulses: Normal pulses.     Heart sounds: Normal  heart sounds.  Pulmonary:     Effort: Pulmonary effort is normal.     Breath sounds: Normal breath sounds.  Abdominal:     General: Abdomen is flat.     Palpations: Abdomen is soft.     Tenderness: There is no abdominal tenderness.  Musculoskeletal:     Cervical back: Tenderness present.     Comments: Tenderness to  palpation of R neck, along trapezius, onto R chest wall  Skin:    General: Skin is warm and dry.  Neurological:     Mental Status: She is alert.     Motor: Weakness present.     Comments: 5/5 throughout except 4/5 RUE (seems pain-limited)     ED Results / Procedures / Treatments   Labs (all labs ordered are listed, but only abnormal results are displayed) Labs Reviewed  CBC WITH DIFFERENTIAL/PLATELET  BASIC METABOLIC PANEL  TROPONIN I (HIGH SENSITIVITY)  TROPONIN I (HIGH SENSITIVITY)    EKG EKG Interpretation  Date/Time:  Thursday April 06 2020 08:09:06 EDT Ventricular Rate:  62 PR Interval:    QRS Duration: 74 QT Interval:  381 QTC Calculation: 387 R Axis:   -8 Text Interpretation: Sinus rhythm Probable left atrial enlargement No significant change since last tracing Confirmed by Alvira Monday ([id number]) on 04/06/2020 8:21:27 AM   Radiology DG Chest 2 View  Result Date: 04/06/2020 CLINICAL DATA:  Chest pain. EXAM: CHEST - 2 VIEW COMPARISON:  CT 06/07/2009.  Chest x-ray 06/07/2009. FINDINGS: Mediastinum and hilar structures normal. Lungs are clear. No pleural effusion or pneumothorax. Heart size normal. Surgical clips upper abdomen. IMPRESSION: No acute cardiopulmonary disease. Electronically Signed   By: Maisie Fus  Register   On: 04/06/2020 08:23    Procedures Procedures (including critical care time)  Medications Ordered in ED Medications - No data to display  ED Course  I have reviewed the triage vital signs and the nursing notes.  Pertinent labs & imaging results that were available during my care of the patient were reviewed by me and considered in my medical decision making (see chart for details).    MDM Rules/Calculators/A&P                          [name] is a 64 y.o. woman with history of ankylosing spondylitis HTN, Vit D deficiency, and gastric bypass surgery who presents to the ED with a 1-week history of R neck muscle spasms and a 1-day history of  R arm heaviness.  Patient is overall well-appearing with stable vitals except for hypertension. Patient had recent normal EKG at PCP, however given complaint of arm heaviness and GERD symptoms cannot rule out atypical chest pain so will repeat EKG, obtain CXR, obtain basic labs including troponin. Suspect pain is more likely MSK-related. Will treat with cyclobenzaprine.  9:28am CXR unremarkable. EKG demonstrates NSR.   11am CBC and BMP unremarkable. High sensitivity troponin I negative. Patient discharged home with prescription for cyclobenzaprine.     Final Clinical Impression(s) / ED Diagnoses Final diagnoses:  Chest pain, unspecified type  Trapezius muscle spasm  Neck pain on right side    Rx / DC Orders ED Discharge Orders         Ordered    cyclobenzaprine (FLEXERIL) 10 MG tablet  2 times daily PRN     Discontinue  Reprint     04/06/20 1102           Alphonzo Severance, MD 04/06/20 1132  Alvira Monday, MD 04/06/20 [phone]

## 2020-04-06 NOTE — ED Notes (Signed)
ED Provider at bedside. 

## 2020-04-06 NOTE — ED Notes (Signed)
Labs in process.

## 2020-04-06 NOTE — ED Triage Notes (Addendum)
Pt c/o "muscle spasm" to right side of neck into right upper chest and back. Onset of symptoms x 1 week ago. Pt has been using husband's tramadol without relief.

## 2020-04-13 ENCOUNTER — Other Ambulatory Visit: Payer: Self-pay

## 2020-04-13 ENCOUNTER — Ambulatory Visit (AMBULATORY_SURGERY_CENTER): Payer: Medicare Other | Admitting: Gastroenterology

## 2020-04-13 ENCOUNTER — Encounter: Payer: Self-pay | Admitting: Gastroenterology

## 2020-04-13 VITALS — BP 139/72 | HR 60 | Temp 96.8°F | Resp 20 | Ht 62.0 in | Wt 204.0 lb

## 2020-04-13 DIAGNOSIS — Z1211 Encounter for screening for malignant neoplasm of colon: Secondary | ICD-10-CM | POA: Diagnosis not present

## 2020-04-13 MED ORDER — SODIUM CHLORIDE 0.9 % IV SOLN: 500.0000 mL | Freq: Once | INTRAVENOUS | Status: DC

## 2020-04-13 NOTE — Progress Notes (Signed)
A/ox3, pleased with MAC, report to RN 

## 2020-04-13 NOTE — Patient Instructions (Signed)
HANDOUTS PROVIDED ON: DIVERTICULOSIS  Your next colonoscopy should occur in 10 years.    You may resume your previous diet and medication schedule.  Thank you for allowing us to care for you today!!!   YOU HAD AN ENDOSCOPIC PROCEDURE TODAY AT THE [facility]:   Refer to the procedure report that was given to you for any specific questions about what was found during the examination.  If the procedure report does not answer your questions, please call your gastroenterologist to clarify.  If you requested that your care partner not be given the details of your procedure findings, then the procedure report has been included in a sealed envelope for you to review at your convenience later.  YOU SHOULD EXPECT: Some feelings of bloating in the abdomen. Passage of more gas than usual.  Walking can help get rid of the air that was put into your GI tract during the procedure and reduce the bloating. If you had a lower endoscopy (such as a colonoscopy or flexible sigmoidoscopy) you may notice spotting of blood in your stool or on the toilet paper. If you underwent a bowel prep for your procedure, you may not have a normal bowel movement for a few days.  Please Note:  You might notice some irritation and congestion in your nose or some drainage.  This is from the oxygen used during your procedure.  There is no need for concern and it should clear up in a day or so.  SYMPTOMS TO REPORT IMMEDIATELY:   Following lower endoscopy (colonoscopy or flexible sigmoidoscopy):  Excessive amounts of blood in the stool  Significant tenderness or worsening of abdominal pains  Swelling of the abdomen that is new, acute  Fever of 100F or higher  For urgent or emergent issues, a gastroenterologist can be reached at any hour by calling [phone]. Do not use MyChart messaging for urgent concerns.    DIET:  We do recommend a small meal at first, but then you may proceed to your regular diet.  Drink  plenty of fluids but you should avoid alcoholic beverages for 24 hours.  ACTIVITY:  You should plan to take it easy for the rest of today and you should NOT DRIVE or use heavy machinery until tomorrow (because of the sedation medicines used during the test).    FOLLOW UP: Our staff will call the number listed on your records 48-72 hours following your procedure to check on you and address any questions or concerns that you may have regarding the information given to you following your procedure. If we do not reach you, we will leave a message.  We will attempt to reach you two times.  During this call, we will ask if you have developed any symptoms of COVID 19. If you develop any symptoms (ie: fever, flu-like symptoms, shortness of breath, cough etc.) before then, please call [phone].  If you test positive for Covid 19 in the 2 weeks post procedure, please call and report this information to us.    If any biopsies were taken you will be contacted by phone or by letter within the next 1-3 weeks.  Please call us at [phone] if you have not heard about the biopsies in 3 weeks.    SIGNATURES/CONFIDENTIALITY: You and/or your care partner have signed paperwork which will be entered into your electronic medical record.  These signatures attest to the fact that that the information above on your After Visit Summary has been reviewed   and is understood.  Full responsibility of the confidentiality of this discharge information lies with you and/or your care-partner. 

## 2020-04-13 NOTE — Op Note (Signed)
[facility] Patient Name: [name] Procedure Date: 04/13/2020 8:45 AM MRN: [id number] Endoscopist: Viviann Spare P. Adela Lank , MD Age: 64 Referring MD:  Date of Birth: [birth year] Gender: Female Account #: [id number] Procedure:                Colonoscopy Indications:              Screening for colorectal malignant neoplasm Medicines:                Monitored Anesthesia Care Procedure:                Pre-Anesthesia Assessment:                           - Prior to the procedure, a History and Physical                            was performed, and patient medications and                            allergies were reviewed. The patient's tolerance of                            previous anesthesia was also reviewed. The risks                            and benefits of the procedure and the sedation                            options and risks were discussed with the patient.                            All questions were answered, and informed consent                            was obtained. Prior Anticoagulants: The patient has                            taken no previous anticoagulant or antiplatelet                            agents. ASA Grade Assessment: II - A patient with                            mild systemic disease. After reviewing the risks                            and benefits, the patient was deemed in                            satisfactory condition to undergo the procedure.                           After obtaining informed consent, the colonoscope  was passed under direct vision. Throughout the                            procedure, the patient's blood pressure, pulse, and                            oxygen saturations were monitored continuously. The                            Colonoscope was introduced through the anus and                            advanced to the the cecum, identified by                            appendiceal orifice  and ileocecal valve. The                            colonoscopy was performed without difficulty. The                            patient tolerated the procedure well. The quality                            of the bowel preparation was good. The ileocecal                            valve, appendiceal orifice, and rectum were                            photographed. Scope In: 9:00:00 AM Scope Out: 9:22:51 AM Scope Withdrawal Time: 0 hours 19 minutes 16 seconds  Total Procedure Duration: 0 hours 22 minutes 51 seconds  Findings:                 The perianal and digital rectal examinations were                            normal.                           There was a small lipoma, in the ascending colon.                           A few small-mouthed diverticula were found in the                            sigmoid colon.                           The exam was otherwise without abnormality. Complications:            No immediate complications. Estimated blood loss:                            None. Estimated Blood Loss:  Estimated blood loss: none. Impression:               - Small lipoma in the ascending colon.                           - Diverticulosis in the sigmoid colon.                           - The examination was otherwise normal.                           - No polyps. Recommendation:           - Patient has a contact number available for                            emergencies. The signs and symptoms of potential                            delayed complications were discussed with the                            patient. Return to normal activities tomorrow.                            Written discharge instructions were provided to the                            patient.                           - Resume previous diet.                           - Continue present medications.                           - Repeat colonoscopy in 10 years for screening                             purposes. Viviann Spare P. [place], MD 04/13/2020 9:26:03 AM This report has been signed electronically.

## 2020-04-13 NOTE — Progress Notes (Signed)
Vitals-CW Temp-JB  Pt's states no medical or surgical changes since previsit or office visit. 

## 2020-04-17 ENCOUNTER — Encounter (INDEPENDENT_AMBULATORY_CARE_PROVIDER_SITE_OTHER): Payer: Self-pay | Admitting: Family Medicine

## 2020-04-17 ENCOUNTER — Telehealth: Payer: Self-pay | Admitting: *Deleted

## 2020-04-17 NOTE — Telephone Encounter (Signed)
Please review. thanks

## 2020-04-17 NOTE — Telephone Encounter (Signed)
°  Follow up Call-  Call back number 04/13/2020 09/02/2018  Post procedure Call Back phone  # [phone]  Permission to leave phone message Yes Yes  Some recent data might be hidden     Patient questions:  Do you have a fever, pain , or abdominal swelling? No. Pain Score  0 *  Have you tolerated food without any problems? Yes.    Have you been able to return to your normal activities? Yes.    Do you have any questions about your discharge instructions: Diet   No. Medications  No. Follow up visit  No.  Do you have questions or concerns about your Care? No.  Actions: * If pain score is 4 or above: No action needed, pain <4.  1. Have you developed a fever since your procedure? no  2.   Have you had an respiratory symptoms (SOB or cough) since your procedure? no  3.   Have you tested positive for COVID 19 since your procedure no  4.   Have you had any family members/close contacts diagnosed with the COVID 19 since your procedure?  no   If yes to any of these questions please route to Laverna Peace, RN and Charlett Lango, RN

## 2020-04-18 ENCOUNTER — Other Ambulatory Visit: Payer: Self-pay

## 2020-04-18 ENCOUNTER — Encounter (INDEPENDENT_AMBULATORY_CARE_PROVIDER_SITE_OTHER): Payer: Self-pay | Admitting: Family Medicine

## 2020-04-18 ENCOUNTER — Ambulatory Visit (INDEPENDENT_AMBULATORY_CARE_PROVIDER_SITE_OTHER): Payer: Medicare Other | Admitting: Family Medicine

## 2020-04-18 VITALS — BP 125/83 | HR 77 | Temp 98.4°F | Ht 62.0 in | Wt 198.0 lb

## 2020-04-18 DIAGNOSIS — Z9189 Other specified personal risk factors, not elsewhere classified: Secondary | ICD-10-CM

## 2020-04-18 DIAGNOSIS — I10 Essential (primary) hypertension: Secondary | ICD-10-CM | POA: Diagnosis not present

## 2020-04-18 DIAGNOSIS — Z6836 Body mass index (BMI) 36.0-36.9, adult: Secondary | ICD-10-CM

## 2020-04-18 DIAGNOSIS — E559 Vitamin D deficiency, unspecified: Secondary | ICD-10-CM

## 2020-04-18 DIAGNOSIS — K5790 Diverticulosis of intestine, part unspecified, without perforation or abscess without bleeding: Secondary | ICD-10-CM

## 2020-04-19 ENCOUNTER — Telehealth: Payer: Self-pay | Admitting: Internal Medicine

## 2020-04-19 NOTE — Telephone Encounter (Signed)
I have spoke with [name] and she is scheduled to pick up HST machine on 04/21/20 @ 3:00pm

## 2020-04-19 NOTE — Telephone Encounter (Signed)
Anita has this order 

## 2020-04-19 NOTE — Progress Notes (Signed)
Chief Complaint:   OBESITY [name] is here to discuss her progress with her obesity treatment plan along with follow-up of her obesity related diagnoses. [name] is on the Category 1 Plan and states she is following her eating plan approximately 75% of the time. [name] states she is exercising for 0 minutes 0 times per week.  Today's visit was #: 4 Starting weight: 202 lbs Starting date: 02/29/2020 Today's weight: 198 lbs Today's date: 04/20/2020 Total lbs lost to date: 4 lbs Total lbs lost since last in-office visit: 2 lbs  Interim History: [name] was recently diagnosed with diverticulosis and is wondering how to increase fiber beyond what she is always doing.  She goes out to eat on weekends and goes off plan.  She orders a baked potato with steak, ribs with BBQ sauce, coleslaw, and sweet potato, fries.  She only drinks water, no caloric beverages.  Hunger is controlled when she is on plan.  Cravings are controlled.  Subjective:   1. Essential hypertension Review: taking medications as instructed, no medication side effects noted, no chest pain on exertion, no dyspnea on exertion, no swelling of ankles.  At last office visit, blood pressure was elevated to 151/74, and is now much improved with improved nutrition and healthier lifestyle habits.  Denies symptoms or concerns.  BP Readings from Last 3 Encounters:  04/18/20 125/83  04/13/20 139/72  04/06/20 (!) 150/85   2. Vitamin D deficiency [name]'s Vitamin D level was 39.9 on 02/29/2020. She is currently taking OTC vitamin D 2000 IU each day and once weekly vitamin D 50,000 IU. She denies nausea, vomiting or muscle weakness.    3. Diverticulosis She has had stomach issues ever since gastric bypass in 2006.  She recently had a colonoscopy showing diverticula.  She was told to get in "x" grams of fiber per day by GI.  She is concerned that she is not getting enough.  4. At risk for constipation [name] is at increased risk for  constipation due to inadequate water intake, changes in diet, and/or use of medications such as GLP1 agonists. [name] denies hard, infrequent stools currently.  With increase in fiber, discussed importance of adequate water intake daily.  Assessment/Plan:   1. Essential hypertension [name] is working on healthy weight loss and exercise to improve blood pressure control. We will watch for signs of hypotension as she continues her lifestyle modifications.  Blood pressure is at goal today.  Continue prudent nutritional plan, weight loss.  Will continue to monitor.  2. Vitamin D deficiency Low Vitamin D level contributes to fatigue and are associated with obesity, breast, and colon cancer. She agrees to continue to take prescription Vitamin D @50 ,000 IU every week and 2000 IU daily and will follow-up for routine testing of Vitamin D, at least 2-3 times per year to avoid over-replacement.  Will recheck level in 3 months to ensure stability after starting medication.  3. Diverticulosis New.  I recommend [name] take OTC fiber supplement Metamucil or psyllium, but the best thing would be for her to ask her GI doctor for further clarification.  Continue prudent nutritional plan, weight loss, adequate hydration, and fiber-rich foods for snacks.  4. At risk for constipation [name] was given approximately 15 minutes of counseling today regarding prevention of constipation. She was encouraged to increase water and fiber intake.   5. Class 2 severe obesity with serious comorbidity and body mass index (BMI) of 36.0 to 36.9 in adult, unspecified obesity type (HCC) [name]  is currently in the action stage of change. As such, her goal is to continue with weight loss efforts. She has agreed to the Category 1 Plan.   Exercise goals: As is.  Behavioral modification strategies: increasing lean protein intake, decreasing simple carbohydrates, decreasing eating out, travel eating strategies and planning for  success.  [name] has agreed to follow-up with our clinic in 2 weeks. She was informed of the importance of frequent follow-up visits to maximize her success with intensive lifestyle modifications for her multiple health conditions.   Objective:   Blood pressure 125/83, pulse 77, temperature 98.4 F (36.9 C), temperature source Oral, height 5\' 2"  (1.575 m), weight 198 lb (89.8 kg), SpO2 100 %. Body mass index is 36.21 kg/m.  General: Cooperative, alert, well developed, in no acute distress. HEENT: Conjunctivae and lids unremarkable. Cardiovascular: Regular rhythm.  Lungs: Normal work of breathing. Neurologic: No focal deficits.   Lab Results  Component Value Date   CREATININE 0.57 04/06/2020   BUN 9 04/06/2020   NA 142 04/06/2020   K 4.7 04/06/2020   CL 104 04/06/2020   CO2 28 04/06/2020   Lab Results  Component Value Date   ALT 15 02/29/2020   AST 20 02/29/2020   ALKPHOS 95 02/29/2020   BILITOT 0.6 02/29/2020   Lab Results  Component Value Date   HGBA1C 5.4 08/02/2019   HGBA1C 5.3 02/18/2018   HGBA1C 5.5 06/11/2016   HGBA1C 5.5 04/20/2013   Lab Results  Component Value Date   INSULIN 3.0 02/18/2018   Lab Results  Component Value Date   TSH 1.340 02/29/2020   Lab Results  Component Value Date   CHOL 150 02/29/2020   HDL 79 02/29/2020   LDLCALC 61 02/29/2020   TRIG 47 02/29/2020   CHOLHDL 2 08/02/2019   Lab Results  Component Value Date   WBC 5.4 04/06/2020   HGB 12.9 04/06/2020   HCT 40.7 04/06/2020   MCV 98.8 04/06/2020   PLT 342 04/06/2020   Lab Results  Component Value Date   IRON 120 06/27/2010   FERRITIN 42.7 06/27/2010   Attestation Statements:   Reviewed by clinician on day of visit: allergies, medications, problem list, medical history, surgical history, family history, social history, and previous encounter notes.  I, 06/29/2010, CMA, am acting as Insurance claims handler for Energy manager, DO.  I have reviewed the above documentation for  accuracy and completeness, and I agree with the above. [employer], DO

## 2020-04-24 ENCOUNTER — Other Ambulatory Visit: Payer: Self-pay

## 2020-04-24 ENCOUNTER — Ambulatory Visit: Payer: Medicare Other

## 2020-04-24 DIAGNOSIS — G4733 Obstructive sleep apnea (adult) (pediatric): Secondary | ICD-10-CM

## 2020-04-26 DIAGNOSIS — G4733 Obstructive sleep apnea (adult) (pediatric): Secondary | ICD-10-CM | POA: Diagnosis not present

## 2020-04-27 DIAGNOSIS — G4733 Obstructive sleep apnea (adult) (pediatric): Secondary | ICD-10-CM | POA: Diagnosis not present

## 2020-05-01 ENCOUNTER — Ambulatory Visit: Payer: Medicare Other | Admitting: Family Medicine

## 2020-05-04 ENCOUNTER — Ambulatory Visit (INDEPENDENT_AMBULATORY_CARE_PROVIDER_SITE_OTHER): Payer: Medicare Other | Admitting: Family Medicine

## 2020-05-08 ENCOUNTER — Ambulatory Visit (INDEPENDENT_AMBULATORY_CARE_PROVIDER_SITE_OTHER): Payer: Medicare Other | Admitting: Adult Health

## 2020-05-08 ENCOUNTER — Encounter (INDEPENDENT_AMBULATORY_CARE_PROVIDER_SITE_OTHER): Payer: Self-pay | Admitting: Adult Health

## 2020-05-08 ENCOUNTER — Other Ambulatory Visit: Payer: Self-pay

## 2020-05-08 VITALS — BP 146/88 | HR 81 | Temp 98.1°F | Ht 62.0 in | Wt 199.0 lb

## 2020-05-08 DIAGNOSIS — Z6836 Body mass index (BMI) 36.0-36.9, adult: Secondary | ICD-10-CM

## 2020-05-08 DIAGNOSIS — I10 Essential (primary) hypertension: Secondary | ICD-10-CM

## 2020-05-08 DIAGNOSIS — E559 Vitamin D deficiency, unspecified: Secondary | ICD-10-CM | POA: Diagnosis not present

## 2020-05-08 DIAGNOSIS — R400 Somnolence: Secondary | ICD-10-CM | POA: Diagnosis not present

## 2020-05-08 MED ORDER — VITAMIN D (ERGOCALCIFEROL) 1.25 MG (50000 UNIT) PO CAPS: 50000.0000 [IU] | ORAL_CAPSULE | ORAL | 0 refills | Status: DC

## 2020-05-09 DIAGNOSIS — R400 Somnolence: Secondary | ICD-10-CM | POA: Insufficient documentation

## 2020-05-09 NOTE — Progress Notes (Signed)
Chief Complaint:   OBESITY [name] is here to discuss her progress with her obesity treatment plan along with follow-up of her obesity related diagnoses. [name] is on the Category 1 Plan and states she is following her eating plan approximately 45-50% of the time. [name] states she is exercising 0 minutes 0 times per week.  Today's visit was #: 10 Starting weight: 196 lbs Starting date: 02/18/2018 Today's weight: 199 lbs Today's date: 05/08/2020 Total lbs lost to date: 0 Total lbs lost since last in-office visit: 0  Interim History: [name] states "I'm really just struggling, especially on weekends." She has been snacking on simple carbohydrate foods and often exceeding her total snack calories per day. She reports an increase in carbohydrate cravings in the afternoon.  Subjective:   Vitamin D deficiency. [name] is on prescription strength Vitamin D supplementation and denies nausea, vomiting, or muscle weakness.    Ref. Range 02/29/2020 12:38  Vitamin D, 25-Hydroxy Latest Ref Range: 30.0 - 100.0 ng/mL 39.9   Essential hypertension. Blood pressure is slightly elevated at her office visit today. [name] is not on any anti-hypertensives currently. She was on a low dose of a blood pressure prescription prior to gastric bypass in 2006 and has been off since then. She denies acute cardiac symptoms.  BP Readings from Last 3 Encounters:  05/08/20 (!) 146/88  04/18/20 125/83  04/13/20 139/72   Lab Results  Component Value Date   CREATININE 0.57 04/06/2020   CREATININE 0.61 02/29/2020   CREATININE 0.67 08/02/2019   Has daytime drowsiness. [name] had a recent sleep study and is awaiting. She reports excessive daytime drowsiness and snoring.  Assessment/Plan:   Vitamin D deficiency. Low Vitamin D level contributes to fatigue and are associated with obesity, breast, and colon cancer. She was given a refill on her Vitamin D, Ergocalciferol, (DRISDOL) 1.25 MG (50000 UNIT)  CAPS capsule every week #4 with 0 refills and will follow-up for routine testing of Vitamin D every 3 months.   Essential hypertension. [name] is working on healthy weight loss and exercise to improve blood pressure control. We will watch for signs of hypotension as she continues her lifestyle modifications. Close monitoring of blood pressure was recommended and if blood pressure continues to elevate will discuss starting an ACE. [name] will continue her Category 1 meal plan.  Has daytime drowsiness. [name] will follow-up with her ordering physician regarding sleep study results.  Class 2 severe obesity with serious comorbidity and body mass index (BMI) of 36.0 to 36.9 in adult, unspecified obesity type (HCC).  [name] is currently in the action stage of change. As such, her goal is to continue with weight loss efforts. She has agreed to the Category 1 Plan. We recommend she use her scale to measure lunch and dinner protein and encouraged eating all protein to decrease cravings.  Handout was provided on Protein Equivalents.  Exercise goals: No exercise has been prescribed at this time.  Behavioral modification strategies: increasing lean protein intake, decreasing simple carbohydrates, meal planning and cooking strategies, better snacking choices and planning for success.  [name] has agreed to follow-up with our clinic in 2 weeks. She was informed of the importance of frequent follow-up visits to maximize her success with intensive lifestyle modifications for her multiple health conditions.   Objective:   Blood pressure (!) 146/88, pulse 81, temperature 98.1 F (36.7 C), temperature source Oral, height 5\' 2"  (1.575 m), weight 199 lb (90.3 kg), SpO2 99 %. Body mass index  is 36.4 kg/m.  General: Cooperative, alert, well developed, in no acute distress. HEENT: Conjunctivae and lids unremarkable. Cardiovascular: Regular rhythm.  Lungs: Normal work of breathing. Neurologic: No focal  deficits.   Lab Results  Component Value Date   CREATININE 0.57 04/06/2020   BUN 9 04/06/2020   NA 142 04/06/2020   K 4.7 04/06/2020   CL 104 04/06/2020   CO2 28 04/06/2020   Lab Results  Component Value Date   ALT 15 02/29/2020   AST 20 02/29/2020   ALKPHOS 95 02/29/2020   BILITOT 0.6 02/29/2020   Lab Results  Component Value Date   HGBA1C 5.4 08/02/2019   HGBA1C 5.3 02/18/2018   HGBA1C 5.5 06/11/2016   HGBA1C 5.5 04/20/2013   Lab Results  Component Value Date   INSULIN 3.0 02/18/2018   Lab Results  Component Value Date   TSH 1.340 02/29/2020   Lab Results  Component Value Date   CHOL 150 02/29/2020   HDL 79 02/29/2020   LDLCALC 61 02/29/2020   TRIG 47 02/29/2020   CHOLHDL 2 08/02/2019   Lab Results  Component Value Date   WBC 5.4 04/06/2020   HGB 12.9 04/06/2020   HCT 40.7 04/06/2020   MCV 98.8 04/06/2020   PLT 342 04/06/2020   Lab Results  Component Value Date   IRON 120 06/27/2010   FERRITIN 42.7 06/27/2010   Obesity Behavioral Intervention Documentation for Insurance:   Approximately 15 minutes were spent on the discussion below.  ASK: We discussed the diagnosis of obesity with [name] today and [name] agreed to give [place] permission to discuss obesity behavioral modification therapy today.  ASSESS: [name] has the diagnosis of obesity and her BMI today is 36.4. [name] is in the action stage of change.   ADVISE: [name] was educated on the multiple health risks of obesity as well as the benefit of weight loss to improve her health. She was advised of the need for long term treatment and the importance of lifestyle modifications to improve her current health and to decrease her risk of future health problems.  AGREE: Multiple dietary modification options and treatment options were discussed and [name] agreed to follow the recommendations documented in the above note.  ARRANGE: [name] was educated on the importance of frequent visits to treat  obesity as outlined per CMS and USPSTF guidelines and agreed to schedule her next follow up appointment today.  Attestation Statements:   Reviewed by clinician on day of visit: allergies, medications, problem list, medical history, surgical history, family history, social history, and previous encounter notes.  I, Marianna Payment, am acting as Energy manager for [employer], NP-C   I have reviewed the above documentation for accuracy and completeness, and I agree with the above. - Julaine Fusi, NP

## 2020-05-11 ENCOUNTER — Other Ambulatory Visit: Payer: Self-pay | Admitting: Internal Medicine

## 2020-05-11 ENCOUNTER — Telehealth: Payer: Self-pay | Admitting: Internal Medicine

## 2020-05-11 DIAGNOSIS — G4733 Obstructive sleep apnea (adult) (pediatric): Secondary | ICD-10-CM

## 2020-05-11 DIAGNOSIS — E669 Obesity, unspecified: Secondary | ICD-10-CM

## 2020-05-11 DIAGNOSIS — R0602 Shortness of breath: Secondary | ICD-10-CM

## 2020-05-11 NOTE — Telephone Encounter (Signed)
Pt has agreed to cpap usage order has been placed in chart along with f/u in 31-90 days

## 2020-05-11 NOTE — Progress Notes (Signed)
amb  

## 2020-05-11 NOTE — Telephone Encounter (Signed)
Home sleep test confirmed moderately severe obstructive sleep apnea, averaging 24 apneas/ hour, with drops in blood oxygen level.  I recommend we order new DME, new CPAP auto 5-15, mask of choice, humidifier, supplies, AirView/ card  She will need a return ov in 31-90 days per insurance requirements

## 2020-05-11 NOTE — Telephone Encounter (Signed)
Pt is calling requesting to know the results of her HST. Dr. Maple Hudson, please advise.

## 2020-05-30 ENCOUNTER — Ambulatory Visit (INDEPENDENT_AMBULATORY_CARE_PROVIDER_SITE_OTHER): Payer: Medicare Other | Admitting: Family Medicine

## 2020-05-30 ENCOUNTER — Other Ambulatory Visit (INDEPENDENT_AMBULATORY_CARE_PROVIDER_SITE_OTHER): Payer: Self-pay | Admitting: Adult Health

## 2020-05-30 ENCOUNTER — Encounter (INDEPENDENT_AMBULATORY_CARE_PROVIDER_SITE_OTHER): Payer: Self-pay | Admitting: Family Medicine

## 2020-05-30 ENCOUNTER — Other Ambulatory Visit: Payer: Self-pay

## 2020-05-30 VITALS — BP 135/85 | HR 75 | Temp 97.9°F | Ht 62.0 in | Wt 197.0 lb

## 2020-05-30 DIAGNOSIS — Z6836 Body mass index (BMI) 36.0-36.9, adult: Secondary | ICD-10-CM | POA: Diagnosis not present

## 2020-05-30 DIAGNOSIS — E559 Vitamin D deficiency, unspecified: Secondary | ICD-10-CM

## 2020-05-30 DIAGNOSIS — F3289 Other specified depressive episodes: Secondary | ICD-10-CM | POA: Diagnosis not present

## 2020-05-30 MED ORDER — BUPROPION HCL ER (SR) 150 MG PO TB12: 150.0000 mg | ORAL_TABLET | Freq: Every day | ORAL | 0 refills | Status: DC

## 2020-05-30 MED ORDER — VITAMIN D (ERGOCALCIFEROL) 1.25 MG (50000 UNIT) PO CAPS: 50000.0000 [IU] | ORAL_CAPSULE | ORAL | 0 refills | Status: DC

## 2020-05-30 NOTE — Progress Notes (Signed)
Chief Complaint:   OBESITY [name] is here to discuss her progress with her obesity treatment plan along with follow-up of her obesity related diagnoses. [name] is on the Category 1 Plan and states she is following her eating plan approximately 75% of the time. [name] states she is doing 0 minutes 0 times per week.  Today's visit was #: 11 Starting weight: 196 lbs Starting date: 02/18/2018 Today's weight: 197 lbs Today's date: 05/30/2020 Total lbs lost to date: 0 Total lbs lost since last in-office visit: 2  Interim History: [name] has done well with weight loss but she is getting bored with her plan, especially lunch. She feels her motivation has decreased and she still struggles to stay on track with her eating plan.  Subjective:   1. Vitamin D deficiency [name]'s Vit D level is not yet at goal. She denies nausea or vomiting. I discussed labs with the patient today.  2. Other depression, with emotional eating [name] has a new diagnosis of depression with emotional eating. She notes increased emotional eating and decreased energy. She feels frustrated with herself.  Assessment/Plan:   1. Vitamin D deficiency Low Vitamin D level contributes to fatigue and are associated with obesity, breast, and colon cancer. We will refill prescription Vitamin D for 1 month. [name] will follow-up for routine testing of Vitamin D, at least 2-3 times per year to avoid over-replacement. We will recheck labs in 1 month.  - Vitamin D, Ergocalciferol, (DRISDOL) 1.25 MG (50000 UNIT) CAPS capsule; Take 1 capsule (50,000 Units total) by mouth every 7 (seven) days.  Dispense: 4 capsule; Refill: 0  2. Other depression, with emotional eating Behavior modification techniques were discussed today to help [name] deal with her emotional/non-hunger eating behaviors. [name] agreed to start Wellbutrin SR 150 mg q AM with no refills. Orders and follow up as documented in patient record.   - buPROPion  (WELLBUTRIN SR) 150 MG 12 hr tablet; Take 1 tablet (150 mg total) by mouth daily.  Dispense: 30 tablet; Refill: 0  3. Class 2 severe obesity with serious comorbidity and body mass index (BMI) of 36.0 to 36.9 in adult, unspecified obesity type [name]) [name] is currently in the action stage of change. As such, her goal is to continue with weight loss efforts. She has agreed to the Category 1 Plan and keeping a food journal and adhering to recommended goals of 300-450 calories and 30 grams of protein at lunch daily.   Behavioral modification strategies: increasing lean protein intake, increasing water intake and emotional eating strategies.  [name] has agreed to follow-up with our clinic in 2 to 3 weeks. She was informed of the importance of frequent follow-up visits to maximize her success with intensive lifestyle modifications for her multiple health conditions.   Objective:   Blood pressure 135/85, pulse 75, temperature 97.9 F (36.6 C), height 5\' 2"  (1.575 m), weight 197 lb (89.4 kg), SpO2 99 %. Body mass index is 36.03 kg/m.  General: Cooperative, alert, well developed, in no acute distress. HEENT: Conjunctivae and lids unremarkable. Cardiovascular: Regular rhythm.  Lungs: Normal work of breathing. Neurologic: No focal deficits.   Lab Results  Component Value Date   CREATININE 0.57 04/06/2020   BUN 9 04/06/2020   NA 142 04/06/2020   K 4.7 04/06/2020   CL 104 04/06/2020   CO2 28 04/06/2020   Lab Results  Component Value Date   ALT 15 02/29/2020   AST 20 02/29/2020   ALKPHOS 95 02/29/2020  BILITOT 0.6 02/29/2020   Lab Results  Component Value Date   HGBA1C 5.4 08/02/2019   HGBA1C 5.3 02/18/2018   HGBA1C 5.5 06/11/2016   HGBA1C 5.5 04/20/2013   Lab Results  Component Value Date   INSULIN 3.0 02/18/2018   Lab Results  Component Value Date   TSH 1.340 02/29/2020   Lab Results  Component Value Date   CHOL 150 02/29/2020   HDL 79 02/29/2020   LDLCALC 61  02/29/2020   TRIG 47 02/29/2020   CHOLHDL 2 08/02/2019   Lab Results  Component Value Date   WBC 5.4 04/06/2020   HGB 12.9 04/06/2020   HCT 40.7 04/06/2020   MCV 98.8 04/06/2020   PLT 342 04/06/2020   Lab Results  Component Value Date   IRON 120 06/27/2010   FERRITIN 42.7 06/27/2010    Obesity Behavioral Intervention Documentation for Insurance:   Approximately 15 minutes were spent on the discussion below.  ASK: We discussed the diagnosis of obesity with [name] today and [name] agreed to give [place] permission to discuss obesity behavioral modification therapy today.  ASSESS: [name] has the diagnosis of obesity and her BMI today is 36.02. [name] is in the action stage of change.   ADVISE: [name] was educated on the multiple health risks of obesity as well as the benefit of weight loss to improve her health. She was advised of the need for long term treatment and the importance of lifestyle modifications to improve her current health and to decrease her risk of future health problems.  AGREE: Multiple dietary modification options and treatment options were discussed and [name] agreed to follow the recommendations documented in the above note.  ARRANGE: [name] was educated on the importance of frequent visits to treat obesity as outlined per CMS and USPSTF guidelines and agreed to schedule her next follow up appointment today.  Attestation Statements:   Reviewed by clinician on day of visit: allergies, medications, problem list, medical history, surgical history, family history, social history, and previous encounter notes.   I, Burt Knack, am acting as transcriptionist for Quillian Quince, MD.  I have reviewed the above documentation for accuracy and completeness, and I agree with the above. -  Quillian Quince, MD

## 2020-06-08 DIAGNOSIS — G4733 Obstructive sleep apnea (adult) (pediatric): Secondary | ICD-10-CM | POA: Diagnosis not present

## 2020-06-12 ENCOUNTER — Telehealth: Payer: Self-pay | Admitting: Internal Medicine

## 2020-06-12 NOTE — Telephone Encounter (Signed)
Spoke with pateint regarding prior message. Patient stated she wanted to know if any other DME company works off patient  Home wifi for her CPAP  so patient can track her own reports. Patient stated she would like for her pressure on CPAP lowered.   Dr.Young can you please advise.  Thank you

## 2020-06-13 ENCOUNTER — Ambulatory Visit: Payer: Self-pay | Admitting: *Deleted

## 2020-06-13 NOTE — Telephone Encounter (Signed)
Most people can get an app on their smart phone to display data from her CPAP machine.  She should discuss that option with her DME.

## 2020-06-14 ENCOUNTER — Ambulatory Visit (INDEPENDENT_AMBULATORY_CARE_PROVIDER_SITE_OTHER): Payer: Medicare Other | Admitting: Family Medicine

## 2020-06-14 ENCOUNTER — Other Ambulatory Visit: Payer: Self-pay

## 2020-06-14 ENCOUNTER — Encounter (INDEPENDENT_AMBULATORY_CARE_PROVIDER_SITE_OTHER): Payer: Self-pay | Admitting: Family Medicine

## 2020-06-14 VITALS — BP 118/75 | HR 70 | Temp 98.1°F | Ht 62.0 in | Wt 200.0 lb

## 2020-06-14 DIAGNOSIS — F3289 Other specified depressive episodes: Secondary | ICD-10-CM

## 2020-06-14 DIAGNOSIS — E559 Vitamin D deficiency, unspecified: Secondary | ICD-10-CM

## 2020-06-14 DIAGNOSIS — E66812 Obesity, class 2: Secondary | ICD-10-CM

## 2020-06-14 DIAGNOSIS — Z9189 Other specified personal risk factors, not elsewhere classified: Secondary | ICD-10-CM

## 2020-06-14 DIAGNOSIS — Z6836 Body mass index (BMI) 36.0-36.9, adult: Secondary | ICD-10-CM

## 2020-06-14 MED ORDER — VITAMIN D (ERGOCALCIFEROL) 1.25 MG (50000 UNIT) PO CAPS: 50000.0000 [IU] | ORAL_CAPSULE | ORAL | 0 refills | Status: DC

## 2020-06-14 NOTE — Telephone Encounter (Signed)
Spoke with patient regarding prior message.Patietn stated that she was able to get her WiFI working with her CPAP. Patient stated she would like CPAP pressure lowered . Patient is waking up with a dry mouth.  Dr.Young can you please advise.  Thank you

## 2020-06-14 NOTE — Progress Notes (Signed)
Chief Complaint:   OBESITY [name] is here to discuss her progress with her obesity treatment plan along with follow-up of her obesity related diagnoses. [name] is on the Category 1 Plan and keeping a food journal and adhering to recommended goals of 300-450 calories and 30 grams of protein at lunch daily and states she is following her eating plan approximately 65% of the time. [name] states she is doing 0 minutes 0 times per week.  Today's visit was #: 12 Starting weight: 196 lbs Starting date: 02/18/2018 Today's weight: 200 lbs Today's date: 06/14/2020 Total lbs lost to date: 0 Total lbs lost since last in-office visit: 0  Interim History: [name] is struggling to stay on track with her eating plan. She is deviating more from her plan and would like to look at other options to help her get back on track with her weight loss efforts.  Subjective:   1. Vitamin D deficiency [name] is stable on Vit D, but her level is not yet at goal. She requests a refill today.  2. Other depression, with emotional eating [name] stopped her Wellbutrin as she felt it made her more tired.  3. At risk for heart disease [name] is at a higher than average risk for cardiovascular disease due to obesity.   Assessment/Plan:   1. Vitamin D deficiency Low Vitamin D level contributes to fatigue and are associated with obesity, breast, and colon cancer. We will refill prescription Vitamin D for 1 month. [name] will follow-up for routine testing of Vitamin D, at least 2-3 times per year to avoid over-replacement.  - Vitamin D, Ergocalciferol, (DRISDOL) 1.25 MG (50000 UNIT) CAPS capsule; Take 1 capsule (50,000 Units total) by mouth every 7 (seven) days.  Dispense: 4 capsule; Refill: 0  2. Other depression, with emotional eating Behavior modification techniques were discussed today to help Shanara deal with her emotional/non-hunger eating behaviors. [name] agreed to discontinue Wellbutrin, and will  continue to work on emotional eating. Orders and follow up as documented in patient record.   3. At risk for heart disease [name] was given approximately 15 minutes of coronary artery disease prevention counseling today. She is 64 y.o. female and has risk factors for heart disease including obesity. We discussed intensive lifestyle modifications today with an emphasis on specific weight loss instructions and strategies.   Repetitive spaced learning was employed today to elicit superior memory formation and behavioral change.  4. Class 2 severe obesity with serious comorbidity and body mass index (BMI) of 36.0 to 36.9 in adult, unspecified obesity type [facility]) [name] is currently in the action stage of change. As such, her goal is to continue with weight loss efforts. She has agreed to change to following a lower carbohydrate, vegetable and lean protein rich diet plan.   Behavioral modification strategies: increasing lean protein intake, increasing water intake and increasing high fiber foods.  [name] has agreed to follow-up with our clinic in 2 weeks. She was informed of the importance of frequent follow-up visits to maximize her success with intensive lifestyle modifications for her multiple health conditions.   Objective:   Blood pressure 118/75, pulse 70, temperature 98.1 F (36.7 C), height 5\' 2"  (1.575 m), weight 200 lb (90.7 kg), SpO2 100 %. Body mass index is 36.58 kg/m.  General: Cooperative, alert, well developed, in no acute distress. HEENT: Conjunctivae and lids unremarkable. Cardiovascular: Regular rhythm.  Lungs: Normal work of breathing. Neurologic: No focal deficits.   Lab Results  Component Value Date  CREATININE 0.57 04/06/2020   BUN 9 04/06/2020   NA 142 04/06/2020   K 4.7 04/06/2020   CL 104 04/06/2020   CO2 28 04/06/2020   Lab Results  Component Value Date   ALT 15 02/29/2020   AST 20 02/29/2020   ALKPHOS 95 02/29/2020   BILITOT 0.6 02/29/2020   Lab  Results  Component Value Date   HGBA1C 5.4 08/02/2019   HGBA1C 5.3 02/18/2018   HGBA1C 5.5 06/11/2016   HGBA1C 5.5 04/20/2013   Lab Results  Component Value Date   INSULIN 3.0 02/18/2018   Lab Results  Component Value Date   TSH 1.340 02/29/2020   Lab Results  Component Value Date   CHOL 150 02/29/2020   HDL 79 02/29/2020   LDLCALC 61 02/29/2020   TRIG 47 02/29/2020   CHOLHDL 2 08/02/2019   Lab Results  Component Value Date   WBC 5.4 04/06/2020   HGB 12.9 04/06/2020   HCT 40.7 04/06/2020   MCV 98.8 04/06/2020   PLT 342 04/06/2020   Lab Results  Component Value Date   IRON 120 06/27/2010   FERRITIN 42.7 06/27/2010   Attestation Statements:   Reviewed by clinician on day of visit: allergies, medications, problem list, medical history, surgical history, family history, social history, and previous encounter notes.   I, Burt Knack, am acting as transcriptionist for Quillian Quince, MD.  I have reviewed the above documentation for accuracy and completeness, and I agree with the above. -  Quillian Quince, MD

## 2020-06-15 ENCOUNTER — Other Ambulatory Visit: Payer: Self-pay

## 2020-06-15 DIAGNOSIS — G4733 Obstructive sleep apnea (adult) (pediatric): Secondary | ICD-10-CM

## 2020-06-15 NOTE — Telephone Encounter (Signed)
Please order- DME [facility]     please reduce CPAP auto range to 4-12

## 2020-06-15 NOTE — Telephone Encounter (Signed)
Sent a order- D[facility]     please reduce CPAP auto range to 4-12.Patient is aware and voice is understanding. Nothing else further needed.

## 2020-06-20 ENCOUNTER — Other Ambulatory Visit (INDEPENDENT_AMBULATORY_CARE_PROVIDER_SITE_OTHER): Payer: Self-pay | Admitting: Family Medicine

## 2020-06-20 DIAGNOSIS — F3289 Other specified depressive episodes: Secondary | ICD-10-CM

## 2020-06-28 ENCOUNTER — Ambulatory Visit (INDEPENDENT_AMBULATORY_CARE_PROVIDER_SITE_OTHER): Payer: Medicare Other | Admitting: Family Medicine

## 2020-06-30 ENCOUNTER — Ambulatory Visit: Payer: Medicare Other | Admitting: Internal Medicine

## 2020-07-06 ENCOUNTER — Encounter (INDEPENDENT_AMBULATORY_CARE_PROVIDER_SITE_OTHER): Payer: Self-pay | Admitting: Family Medicine

## 2020-07-06 ENCOUNTER — Ambulatory Visit (INDEPENDENT_AMBULATORY_CARE_PROVIDER_SITE_OTHER): Payer: Medicare Other | Admitting: Family Medicine

## 2020-07-06 ENCOUNTER — Other Ambulatory Visit: Payer: Self-pay

## 2020-07-06 VITALS — BP 145/87 | HR 68 | Temp 98.4°F | Ht 62.0 in | Wt 201.0 lb

## 2020-07-06 DIAGNOSIS — Z6836 Body mass index (BMI) 36.0-36.9, adult: Secondary | ICD-10-CM

## 2020-07-06 DIAGNOSIS — F3289 Other specified depressive episodes: Secondary | ICD-10-CM

## 2020-07-06 DIAGNOSIS — Z9884 Bariatric surgery status: Secondary | ICD-10-CM

## 2020-07-06 MED ORDER — TOPIRAMATE 25 MG PO TABS: 25.0000 mg | ORAL_TABLET | Freq: Every day | ORAL | 0 refills | Status: DC

## 2020-07-06 NOTE — Progress Notes (Signed)
Chief Complaint:   OBESITY [name] is here to discuss her progress with her obesity treatment plan along with follow-up of her obesity related diagnoses. [name] is on following a lower carbohydrate, vegetable and lean protein rich diet plan and states she is following her eating plan approximately 65-75% of the time. [name] states she is doing 0 minutes 0 times per week.  Today's visit was #: 13 Starting weight: 196 lbs Starting date: 02/18/2018 Today's weight: 201 lbs Today's date: 07/06/2020 Total lbs lost to date: 0 Total lbs lost since last in-office visit: 0  Interim History: [name] was started on the Low carbohydrate plan at her last office visit, but she notes difficulty adhering to the plan due to caring for her parents. She is up 1 lb today. She is status post RNY in 2006.  She does have occasional fast food. She struggles with night eating. She has done Journaling, Category 1, and the Low carbohydrate plan.  Subjective:   1. 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 She did not tolerate Wellbutrin.  2. History of gastric bypass [name] has RNY gastric bypass in 2006 by Dr. Cameron [name]. Nadir 143 lbs. Her top weight before surgery was 235 lbs. She has been at 200 lbs for several years. She notes "grazes". She has portion restriction, so her portions are not large at meals. She grazes on crackers and other carbohydrates.  Assessment/Plan:   1. Other depression, with emotional eating Behavior modification techniques were discussed today to help [name] deal with her emotional/non-hunger eating behaviors. [name] agreed to start topiramate 25 mg qhs with no refills.    - topiramate (TOPAMAX) 25 MG tablet; Take 1 tablet (25 mg total) by mouth at bedtime.  Dispense: 30 tablet; Refill: 0  2. History of gastric bypass [name] will focus on removing access to carbohydrates in her home and ask her husband to help with this.  3. Class 2  severe obesity with serious comorbidity and body mass index (BMI) of 36.0 to 36.9 in adult, unspecified obesity type [facility]) [name] is currently in the action stage of change. As such, her goal is to continue with weight loss efforts. She has agreed to following a lower carbohydrate, vegetable and lean protein rich diet plan.   We discussed removing crackers and other carbohydrates from her home.  Exercise goals: No exercise has been prescribed at this time.  Behavioral modification strategies: increasing lean protein intake and decreasing simple carbohydrates.  [name] has agreed to follow-up with our clinic in 3 weeks.  Objective:   Blood pressure (!) 145/87, pulse 68, temperature 98.4 F (36.9 C), temperature source Oral, height 5\' 2"  (1.575 m), weight 201 lb (91.2 kg), SpO2 100 %. Body mass index is 36.76 kg/m.  General: Cooperative, alert, well developed, in no acute distress. HEENT: Conjunctivae and lids unremarkable. Cardiovascular: Regular rhythm.  Lungs: Normal work of breathing. Neurologic: No focal deficits.   Lab Results  Component Value Date   CREATININE 0.57 04/06/2020   BUN 9 04/06/2020   NA 142 04/06/2020   K 4.7 04/06/2020   CL 104 04/06/2020   CO2 28 04/06/2020   Lab Results  Component Value Date   ALT 15 02/29/2020   AST 20 02/29/2020   ALKPHOS 95 02/29/2020   BILITOT 0.6 02/29/2020   Lab Results  Component Value Date   HGBA1C 5.4 08/02/2019   HGBA1C 5.3 02/18/2018   HGBA1C 5.5 06/11/2016   HGBA1C 5.5 04/20/2013  Lab Results  Component Value Date   INSULIN 3.0 02/18/2018   Lab Results  Component Value Date   TSH 1.340 02/29/2020   Lab Results  Component Value Date   CHOL 150 02/29/2020   HDL 79 02/29/2020   LDLCALC 61 02/29/2020   TRIG 47 02/29/2020   CHOLHDL 2 08/02/2019   Lab Results  Component Value Date   WBC 5.4 04/06/2020   HGB 12.9 04/06/2020   HCT 40.7 04/06/2020   MCV 98.8 04/06/2020   PLT 342 04/06/2020   Lab Results   Component Value Date   IRON 120 06/27/2010   FERRITIN 42.7 06/27/2010    Obesity Behavioral Intervention:   Approximately 15 minutes were spent on the discussion below.  ASK: We discussed the diagnosis of obesity with [name] today and [name] agreed to give [place] permission to discuss obesity behavioral modification therapy today.  ASSESS: [name] has the diagnosis of obesity and her BMI today is 36.75. [name] is in the action stage of change.   ADVISE: [name] was educated on the multiple health risks of obesity as well as the benefit of weight loss to improve her health. She was advised of the need for long term treatment and the importance of lifestyle modifications to improve her current health and to decrease her risk of future health problems.  AGREE: Multiple dietary modification options and treatment options were discussed and [name] agreed to follow the recommendations documented in the above note.  ARRANGE: [name] was educated on the importance of frequent visits to treat obesity as outlined per CMS and USPSTF guidelines and agreed to schedule her next follow up appointment today.  Attestation Statements:   Reviewed by clinician on day of visit: allergies, medications, problem list, medical history, surgical history, family history, social history, and previous encounter notes.   [name], am acting as Energy manager for [place], FNP-C.  I have reviewed the above documentation for accuracy and completeness, and I agree with the above. -  Courtney Sans, FNP

## 2020-07-08 DIAGNOSIS — G4733 Obstructive sleep apnea (adult) (pediatric): Secondary | ICD-10-CM | POA: Diagnosis not present

## 2020-07-10 ENCOUNTER — Encounter (INDEPENDENT_AMBULATORY_CARE_PROVIDER_SITE_OTHER): Payer: Self-pay | Admitting: Family Medicine

## 2020-07-10 DIAGNOSIS — Z9884 Bariatric surgery status: Secondary | ICD-10-CM | POA: Insufficient documentation

## 2020-07-10 DIAGNOSIS — F32A Depression, unspecified: Secondary | ICD-10-CM | POA: Insufficient documentation

## 2020-07-27 ENCOUNTER — Other Ambulatory Visit: Payer: Self-pay

## 2020-07-27 ENCOUNTER — Encounter (INDEPENDENT_AMBULATORY_CARE_PROVIDER_SITE_OTHER): Payer: Self-pay | Admitting: Family Medicine

## 2020-07-27 ENCOUNTER — Telehealth (INDEPENDENT_AMBULATORY_CARE_PROVIDER_SITE_OTHER): Payer: Medicare Other | Admitting: Family Medicine

## 2020-07-27 DIAGNOSIS — Z6836 Body mass index (BMI) 36.0-36.9, adult: Secondary | ICD-10-CM | POA: Diagnosis not present

## 2020-07-27 DIAGNOSIS — E559 Vitamin D deficiency, unspecified: Secondary | ICD-10-CM

## 2020-07-27 DIAGNOSIS — F3289 Other specified depressive episodes: Secondary | ICD-10-CM | POA: Diagnosis not present

## 2020-07-27 MED ORDER — VITAMIN D (ERGOCALCIFEROL) 1.25 MG (50000 UNIT) PO CAPS: 50000.0000 [IU] | ORAL_CAPSULE | ORAL | 0 refills | Status: DC

## 2020-07-27 NOTE — Progress Notes (Signed)
TeleHealth Visit:  Due to the COVID-19 pandemic, this visit was completed with telemedicine (audio/video) technology to reduce patient and provider exposure as well as to preserve personal protective equipment.   [name] has verbally consented to this TeleHealth visit. The patient is located at home, the provider is located at the [facility] [facility] office. The participants in this visit include the listed provider and patient. The visit was conducted today via video.  Chief Complaint: OBESITY [name] is here to discuss her progress with her obesity treatment plan along with follow-up of her obesity related diagnoses. [name] is following a lower carbohydrate, vegetable and lean protein rich diet plan and states she is following her eating plan approximately 65% of the time. [name] states she is exercising 0 minutes 0 times per week.  Today's visit was #: 14 Starting weight: 196 lbs Starting date: 02/18/2018  Interim History: [name] reports weighing 200 lbs at home this a.m. She weighed 201 lbs in the office 07/06/2020. She feels she is making poor choices such as eating out. The low carb plan has been hard for her to adhere to. She feels that her grazing has decreased. She is caring for her parents and her husband is having a total knee replacement next month and she is overwhelmed. She wishes to take a hiatus from the program and will follow-up in January.  Subjective:   Other depression, with emotional eating.  [name] admits to grazing and eating when she is not hungry. She also eats when stressed. Topamax 25 mg was started at the time of her last office visit, which she has not yet started secondary to being afraid of side effects.  Vitamin D deficiency. Vitamin D level is low at 39.9. Luan is on weekly prescription Vitamin D supplementation.    Ref. Range 02/29/2020 12:38  Vitamin D, 25-Hydroxy Latest Ref Range: 30.0 - 100.0 ng/mL 39.9   Assessment/Plan:    Other depression, with emotional eating. [name] will think about starting the Topamax.  Vitamin D deficiency.  She was given a refill on her Vitamin D, Ergocalciferol, (DRISDOL) 1.25 MG (50000 UNIT) CAPS capsule every week #12 with 0 refills and will follow-up for routine testing of Vitamin D, at least 2-3 times per year to avoid over-replacement.   Class 2 severe obesity with serious comorbidity and body mass index (BMI) of 36.0 to 36.9 in adult, unspecified obesity type (HCC).  [name] is currently in the action stage of change. As such, her goal is to continue with weight loss efforts. She has agreed to keeping a food journal and adhering to recommended goals of 1000-1100 calories and 80 grams of protein daily.   Handouts were sent via My Chart.  Exercise goals: All adults should avoid inactivity. Some physical activity is better than none, and adults who participate in any amount of physical activity gain some health benefits.  Behavioral modification strategies: increasing lean protein intake, decreasing simple carbohydrates and keeping a strict food journal.  [name] has agreed to follow-up with our clinic in January. She will call in December to set up appointment.   Objective:   VITALS: Per patient if applicable, see vitals. GENERAL: Alert and in no acute distress. CARDIOPULMONARY: No increased WOB. Speaking in clear sentences.  PSYCH: Pleasant and cooperative. Speech normal rate and rhythm. Affect is appropriate. Insight and judgement are appropriate. Attention is focused, linear, and appropriate.  NEURO: Oriented as arrived to appointment on time with no prompting.   Lab  Results  Component Value Date   CREATININE 0.57 04/06/2020   BUN 9 04/06/2020   NA 142 04/06/2020   K 4.7 04/06/2020   CL 104 04/06/2020   CO2 28 04/06/2020   Lab Results  Component Value Date   ALT 15 02/29/2020   AST 20 02/29/2020   ALKPHOS 95 02/29/2020   BILITOT 0.6 02/29/2020   Lab  Results  Component Value Date   HGBA1C 5.4 08/02/2019   HGBA1C 5.3 02/18/2018   HGBA1C 5.5 06/11/2016   HGBA1C 5.5 04/20/2013   Lab Results  Component Value Date   INSULIN 3.0 02/18/2018   Lab Results  Component Value Date   TSH 1.340 02/29/2020   Lab Results  Component Value Date   CHOL 150 02/29/2020   HDL 79 02/29/2020   LDLCALC 61 02/29/2020   TRIG 47 02/29/2020   CHOLHDL 2 08/02/2019   Lab Results  Component Value Date   WBC 5.4 04/06/2020   HGB 12.9 04/06/2020   HCT 40.7 04/06/2020   MCV 98.8 04/06/2020   PLT 342 04/06/2020   Lab Results  Component Value Date   IRON 120 06/27/2010   FERRITIN 42.7 06/27/2010   Attestation Statements:   Reviewed by clinician on day of visit: allergies, medications, problem list, medical history, surgical history, family history, social history, and previous encounter notes.  IMarianna Payment, am acting as Energy manager for [place], FNP-C   I have reviewed the above documentation for accuracy and completeness, and I agree with the above. - Jesse Sans, FNP

## 2020-08-08 DIAGNOSIS — G4733 Obstructive sleep apnea (adult) (pediatric): Secondary | ICD-10-CM | POA: Diagnosis not present

## 2020-08-09 ENCOUNTER — Telehealth: Payer: Self-pay | Admitting: Family Medicine

## 2020-08-09 NOTE — Telephone Encounter (Signed)
Patient states urinary / bladder pressure and would like to speak with a nurse in reference to this   Please advise

## 2020-08-09 NOTE — Telephone Encounter (Signed)
Spoke with patient. Appt made for Friday

## 2020-08-11 ENCOUNTER — Ambulatory Visit: Payer: Self-pay | Admitting: Family Medicine

## 2020-08-14 ENCOUNTER — Ambulatory Visit: Payer: Self-pay | Admitting: Family Medicine

## 2020-08-14 NOTE — Progress Notes (Signed)
HPI F never smoker followed for OSA, complicated by  HTN, GERD, IBS, Thyroid Cyst, Ankylosing Spondylitis, Urticaria, Morbid  Obesity s/p Bariatric Surgery, Allergic Rhinitis NPSG 03/07/16- AHI 15.7/ hr, desaturation to 84%, body weight 194 lbs HST 04/24/20- AHI 24.1/ hr, desaturation to 80%, body weight 202 lbs  =======================================================  03/22/20- 63 yoF never smoker coming to re-establish for OSA after last here in 2017. Had been followed by Dr Craige Cotta and last saw NP. Medical problem list includes HTN, GERD, IBS, Thyroid Cyst, Ankylosing Spondylitis, Urticaria, Morbid  Obesity s/p Bariatric Surgery, Allergic Rhinitis NPSG 03/07/16- AHI 15.7/ hr, desaturation to 84%, body weight 194 lbs CPAP had been started but she quit due to nasal congestion she blamed on full-face mask, didn't call us and was lost to follow-up. Her husband complains of her snoring, makes her turn over. She wakes with light headache and admits daytime tiredness any time she is quiet.  Not told of parasomnias. Denies hx ENT surgery, heart or lung disease or seizures. Has taken occasional Zzquil for sleep. 1 cup AM coffee and 1-2 cups green tea.  Epworth score 17 Body weight today- 202 lbs Now menopausal/ hot flashes. Had 2 Phizer Covax For Spring tree pollen rhinitis takes Claritin.   11/16/211- 63 yoF never smoker followed for OSA, complicated by  HTN, GERD, IBS, Thyroid Cyst, Ankylosing Spondylitis, Urticaria, Morbid  Obesity s/p Bariatric Surgery, Allergic Rhinitis HST 04/24/20- AHI 24.1/ hr, desaturation to 80%, body weight 202 lbs CPAP auto 4-12/ [employer] Download- compliance 91%, AHI 1/ hr Body weight today- 202 lbs Covid vax- 2 Phizer Flu vax- Declined ------pt states settings need to be adjusted. pt here for cpap compliance She is settling into CPAP use. Nasal pillows mask. Oral venting if she rolls onto back.We reviewed download and she agrees to keep current pressure range. Sleeping better.   CXR 04/06/20- IMPRESSION: No acute cardiopulmonary disease.   ROS-see HPI   + = positive Constitutional:    weight loss, night sweats, fevers, chills, fatigue, lassitude. HEENT:    headaches, difficulty swallowing, tooth/dental problems, sore throat,       sneezing, itching, ear ache, nasal congestion, post nasal drip, snoring CV:    chest pain, orthopnea, PND, swelling in lower extremities, anasarca,                                  dizziness, +palpitations Resp:   shortness of breath with exertion or at rest.                productive cough,   non-productive cough, coughing up of blood.              change in color of mucus.  wheezing.   Skin:    rash or lesions. GI:  +-   Heartburn,+ indigestion, abdominal pain, nausea, vomiting, diarrhea,                 change in bowel habits, loss of appetite GU: dysuria, change in color of urine, no urgency or frequency.   flank pain. MS:   +joint pain, stiffness, decreased range of motion, +back pain. Neuro-     nothing unusual Psych:  change in mood or affect.  depression or anxiety.   memory loss.  OBJ- Physical Exam General- Alert, Oriented, Affect-appropriate, Distress- none acute., + obese Skin- rash-none, lesions- none, excoriation- none Lymphadenopathy- none Head- atraumatic  Eyes- Gross vision intact, PERRLA, conjunctivae and secretions clear            Ears- Hearing, canals-normal            Nose- Clear, no-Septal dev, mucus, polyps, erosion, perforation             Throat- Mallampati IV , mucosa clear , drainage- none, tonsils- atrophic, + teeth Neck- flexible , trachea midline, no stridor , thyroid nl, carotid no bruit Chest - symmetrical excursion , unlabored           Heart/CV- RRR , no murmur , no gallop  , no rub, nl s1 s2                           - JVD- none , edema- none, stasis changes- none, varices- none           Lung- clear to P&A, wheeze- none, cough- none , dullness-none, rub- none           Chest wall-   Abd-  Br/ Gen/ Rectal- Not done, not indicated Extrem- cyanosis- none, clubbing, none, atrophy- none, strength- nl Neuro- grossly intact to observation

## 2020-08-15 ENCOUNTER — Encounter: Payer: Self-pay | Admitting: Internal Medicine

## 2020-08-15 ENCOUNTER — Ambulatory Visit: Payer: Medicare Other | Admitting: Internal Medicine

## 2020-08-15 ENCOUNTER — Other Ambulatory Visit: Payer: Self-pay

## 2020-08-15 DIAGNOSIS — E669 Obesity, unspecified: Secondary | ICD-10-CM | POA: Diagnosis not present

## 2020-08-15 DIAGNOSIS — G4733 Obstructive sleep apnea (adult) (pediatric): Secondary | ICD-10-CM

## 2020-08-15 NOTE — Assessment & Plan Note (Signed)
Benefits with improved sleep. Good CPAP compliance and control. Plan- continue auto 4-12. Add chin strap

## 2020-08-15 NOTE — Patient Instructions (Signed)
Order- DME [employer]- Please provide chin strap. We can continue CPAP auto 4-12, mask of choice, humidifier, supplies, airView/ card  Please call if we can help

## 2020-08-15 NOTE — Assessment & Plan Note (Signed)
She understands that weight loss would help with several of her health issues. Encourage diet/ exercise.

## 2020-08-21 ENCOUNTER — Ambulatory Visit: Payer: Medicare Other

## 2020-08-22 ENCOUNTER — Encounter: Payer: Self-pay | Admitting: Family Medicine

## 2020-08-22 ENCOUNTER — Ambulatory Visit (INDEPENDENT_AMBULATORY_CARE_PROVIDER_SITE_OTHER): Payer: Medicare Other | Admitting: Family Medicine

## 2020-08-22 ENCOUNTER — Other Ambulatory Visit: Payer: Self-pay

## 2020-08-22 VITALS — BP 130/86 | HR 107 | Temp 99.0°F | Resp 18 | Ht 62.0 in | Wt 200.6 lb

## 2020-08-22 DIAGNOSIS — Z136 Encounter for screening for cardiovascular disorders: Secondary | ICD-10-CM | POA: Diagnosis not present

## 2020-08-22 DIAGNOSIS — Z8639 Personal history of other endocrine, nutritional and metabolic disease: Secondary | ICD-10-CM | POA: Diagnosis not present

## 2020-08-22 DIAGNOSIS — Z9884 Bariatric surgery status: Secondary | ICD-10-CM | POA: Diagnosis not present

## 2020-08-22 DIAGNOSIS — E559 Vitamin D deficiency, unspecified: Secondary | ICD-10-CM | POA: Diagnosis not present

## 2020-08-22 LAB — B12 AND FOLATE PANEL
Folate: 16.5 ng/mL (ref >5.9)
Vitamin B-12: 314 pg/mL (ref 211–911)

## 2020-08-22 LAB — COMPREHENSIVE METABOLIC PANEL
ALT: 18 U/L (ref 0–35)
AST: 18 U/L (ref 0–37)
Albumin: 4.2 g/dL (ref 3.5–5.2)
Alkaline Phosphatase: 65 U/L (ref 39–117)
BUN: 12 mg/dL (ref 6–23)
CO2: 29 mEq/L (ref 19–32)
Calcium: 10 mg/dL (ref 8.4–10.5)
Chloride: 103 mEq/L (ref 96–112)
Creatinine, Ser: 0.73 mg/dL (ref 0.40–1.20)
GFR: 88.44 mL/min (ref >60.00)
Glucose, Bld: 109 mg/dL — ABNORMAL HIGH (ref 70–99)
Potassium: 4.1 mEq/L (ref 3.5–5.1)
Sodium: 141 mEq/L (ref 135–145)
Total Bilirubin: 0.5 mg/dL (ref 0.2–1.2)
Total Protein: 7.3 g/dL (ref 6.0–8.3)

## 2020-08-22 LAB — CBC WITH DIFFERENTIAL/PLATELET
Basophils Absolute: 0 10*3/uL (ref 0.0–0.1)
Basophils Relative: 0.4 % (ref 0.0–3.0)
Eosinophils Absolute: 0 10*3/uL (ref 0.0–0.7)
Eosinophils Relative: 0 % (ref 0.0–5.0)
HCT: 41 % (ref 36.0–46.0)
Hemoglobin: 13.4 g/dL (ref 12.0–15.0)
Lymphocytes Relative: 17.6 % (ref 12.0–46.0)
Lymphs Abs: 1.2 10*3/uL (ref 0.7–4.0)
MCHC: 32.7 g/dL (ref 30.0–36.0)
MCV: 96.2 fl (ref 78.0–100.0)
Monocytes Absolute: 0.3 10*3/uL (ref 0.1–1.0)
Monocytes Relative: 5.3 % (ref 3.0–12.0)
Neutro Abs: 5.1 10*3/uL (ref 1.4–7.7)
Neutrophils Relative %: 76.7 % (ref 43.0–77.0)
Platelets: 347 10*3/uL (ref 150.0–400.0)
RBC: 4.26 Mil/uL (ref 3.87–5.11)
RDW: 13.2 % (ref 11.5–15.5)
WBC: 6.6 10*3/uL (ref 4.0–10.5)

## 2020-08-22 LAB — POC URINALSYSI DIPSTICK (AUTOMATED)
Blood, UA: NEGATIVE
Glucose, UA: NEGATIVE
Leukocytes, UA: NEGATIVE
Nitrite, UA: NEGATIVE
Protein, UA: POSITIVE — AB
Spec Grav, UA: >=1.03 — AB (ref 1.010–1.025)
Urobilinogen, UA: 0.2 E.U./dL
pH, UA: 5 (ref 5.0–8.0)

## 2020-08-22 LAB — LIPID PANEL
Cholesterol: 146 mg/dL (ref 0–200)
HDL: 78.4 mg/dL (ref >39.00)
LDL Cholesterol: 57 mg/dL (ref 0–99)
NonHDL: 68.04
Total CHOL/HDL Ratio: 2
Triglycerides: 55 mg/dL (ref 0.0–149.0)
VLDL: 11 mg/dL (ref 0.0–40.0)

## 2020-08-22 LAB — VITAMIN D 25 HYDROXY (VIT D DEFICIENCY, FRACTURES): VITD: 43.61 ng/mL (ref 30.00–100.00)

## 2020-08-22 LAB — TSH: TSH: 0.52 u[IU]/mL (ref 0.35–4.50)

## 2020-08-22 NOTE — Patient Instructions (Signed)

## 2020-08-22 NOTE — Progress Notes (Signed)
Subjective:     [name] is a 64 y.o. female and is here for a comprehensive physical exam. The patient reports no problems.  Social History   Socioeconomic History  . Marital status: Married    Spouse name: Alantra Popoca  . Number of children: Not on file  . Years of education: Not on file  . Highest education level: Not on file  Occupational History  . Occupation: retired  Tobacco Use  . Smoking status: Never Smoker  . Smokeless tobacco: Never Used  Vaping Use  . Vaping Use: Never used  Substance and Sexual Activity  . Alcohol use: No    Alcohol/week: 0.0 standard drinks  . Drug use: No  . Sexual activity: Yes  Other Topics Concern  . Not on file  Social History Narrative   Occupation: Sports coach   Daily Caffeine use- 1 cup daily      Social Determinants of Health   Financial Resource Strain:   . Difficulty of Paying Living Expenses: Not on file  Food Insecurity:   . Worried About Programme researcher, broadcasting/film/video in the Last Year: Not on file  . Ran Out of Food in the Last Year: Not on file  Transportation Needs:   . Lack of Transportation (Medical): Not on file  . Lack of Transportation (Non-Medical): Not on file  Physical Activity:   . Days of Exercise per Week: Not on file  . Minutes of Exercise per Session: Not on file  Stress:   . Feeling of Stress : Not on file  Social Connections:   . Frequency of Communication with Friends and Family: Not on file  . Frequency of Social Gatherings with Friends and Family: Not on file  . Attends Religious Services: Not on file  . Active Member of Clubs or Organizations: Not on file  . Attends Banker Meetings: Not on file  . Marital Status: Not on file  Intimate Partner Violence:   . Fear of Current or Ex-Partner: Not on file  . Emotionally Abused: Not on file  . Physically Abused: Not on file  . Sexually Abused: Not on file   Health Maintenance  Topic Date Due  . INFLUENZA VACCINE  04/30/2020  .  MAMMOGRAM  09/30/2020  . PAP SMEAR-Modifier  09/30/2021  . TETANUS/TDAP  03/16/2024  . COLONOSCOPY  04/13/2030  . COVID-19 Vaccine  Completed  . Hepatitis C Screening  Completed  . HIV Screening  Completed    The following portions of the patient's history were reviewed and updated as appropriate:  She  has a past medical history of Abdominal pain, other specified site (03/31/2008), Acute sprain or strain of cervical region (04/10/2012), ALLERGIC RHINITIS (06/26/2007), ANKYLOSING SPONDYLITIS (03/31/2008), Ankylosing spondylitis (HCC), Ankylosing spondylitis of multiple sites in spine [facility]), Arthritis, Back pain, Bariatric surgery status (01/14/2008), Blood in stool (12/02/2008), Blood transfusion without reported diagnosis, Calf pain (10/13/2012), CHEST PAIN UNSPECIFIED (10/11/2009), CONSTIPATION (04/06/2010), CORNEAL ABRASION, LEFT (01/22/2010), Gastritis, GERD (06/26/2007), GERD (gastroesophageal reflux disease), Hiatal hernia, Hypertension, IBS (06/26/2007), Lactose intolerance, LEG CRAMPS, NOCTURNAL (11/14/2010), Long-term current use of steroids (04/20/2013), LYMPHADENOPATHY (06/10/2007), MORBID OBESITY (06/26/2007), MUSCLE PAIN (03/31/2008), Noninfectious gastroenteritis and colitis (03/17/2013), Obesity (BMI 30-39.9) (03/16/2014), OSA (obstructive sleep apnea) (03/08/2016), Other specified disease of white blood cells (07/23/2010), Rheumatoid arthritis (HCC), Seasonal allergies (05/14/2015), THYROID CYST (06/26/2007), Thyroid nodule, THYROID NODULE, HX OF (06/10/2007), and Vitamin D deficiency. She does not have any pertinent problems on file. She  has a past surgical history that  includes Esophagogastroduodenoscopy; Breast reduction surgery (1996); Total hip arthroplasty; Bariatric Surgery; pelvis replacement (2010); Upper gastrointestinal endoscopy; and Colonoscopy (10/24/2009). Her family history includes Arthritis in her father; Breast cancer in her maternal grandmother; Diabetes in her father, mother, and sister; High  Cholesterol in her father and mother; Hypertension in her father, mother, and sister; Melanoma in her maternal grandmother. She  reports that she has never smoked. She has never used smokeless tobacco. She reports that she does not drink alcohol and does not use drugs. She has a current medication list which includes the following prescription(s): biotin w/ vitamins c & e, calcium, vitamin d, cyclobenzaprine, diphenhydramine, multivitamin-iron-minerals-folic acid, polyethylene glycol powder, potassium, topiramate, vitamin d (ergocalciferol), and zinc gluconate. Current Outpatient Medications on File Prior to Visit  Medication Sig Dispense Refill  . Biotin w/ Vitamins C & E (HAIR SKIN & NAILS GUMMIES PO) Take 1 each by mouth daily.    Marland Kitchen CALCIUM PO Take by mouth.    . Cholecalciferol (VITAMIN D) 50 MCG (2000 UT) CAPS Take 1 capsule by mouth daily.    . cyclobenzaprine (FLEXERIL) 10 MG tablet Take 1 tablet (10 mg total) by mouth 2 (two) times daily as needed for muscle spasms. 20 tablet 0  . diphenhydrAMINE (BENADRYL) 25 mg capsule Take 1 capsule (25 mg total) by mouth every 8 (eight) hours as needed. 30 capsule 0  . multivitamin-iron-minerals-folic acid (CENTRUM) chewable tablet Chew 1 tablet by mouth daily.    . polyethylene glycol powder (GLYCOLAX/MIRALAX) 17 GM/SCOOP powder Take 17 g by mouth 2 (two) times daily as needed. 3350 g 0  . Potassium 99 MG TABS Take 1 tablet by mouth daily.     Marland Kitchen topiramate (TOPAMAX) 25 MG tablet Take 1 tablet (25 mg total) by mouth at bedtime. 30 tablet 0  . Vitamin D, Ergocalciferol, (DRISDOL) 1.25 MG (50000 UNIT) CAPS capsule Take 1 capsule (50,000 Units total) by mouth every 7 (seven) days. 12 capsule 0  . zinc gluconate 50 MG tablet Take 50 mg by mouth daily.     No current facility-administered medications on file prior to visit.   She is allergic to aspirin, codeine, morphine, and sulfonamide derivatives..  Review of Systems Review of Systems   Constitutional: Negative for activity change, appetite change and fatigue.  HENT: Negative for hearing loss, congestion, tinnitus and ear discharge.  dentist q23m Eyes: Negative for visual disturbance (see optho q1y -- vision corrected to 20/20 with glasses).  Respiratory: Negative for cough, chest tightness and shortness of breath.   Cardiovascular: Negative for chest pain, palpitations and leg swelling.  Gastrointestinal: Negative for abdominal pain, diarrhea, constipation and abdominal distention.  Genitourinary: Negative for urgency, frequency, decreased urine volume and difficulty urinating.  Musculoskeletal: Negative for back pain, arthralgias and gait problem.  Skin: Negative for color change, pallor and rash.  Neurological: Negative for dizziness, light-headedness, numbness and headaches.  Hematological: Negative for adenopathy. Does not bruise/bleed easily.  Psychiatric/Behavioral: Negative for suicidal ideas, confusion, sleep disturbance, self-injury, dysphoric mood, decreased concentration and agitation.       Objective:    BP 130/86 (BP Location: Left Arm, Patient Position: Sitting, Cuff Size: Normal)   Pulse (!) 107   Temp 99 F (37.2 C) (Oral)   Resp 18   Ht 5\' 2"  (1.575 m)   Wt 200 lb 9.6 oz (91 kg)   SpO2 98%   BMI 36.69 kg/m  General appearance: alert, cooperative, appears stated age and no distress Head: Normocephalic, without obvious abnormality, atraumatic  Eyes: negative findings: lids and lashes normal, conjunctivae and sclerae normal and pupils equal, round, reactive to light and accomodation Ears: normal TM's and external ear canals both ears Neck: no adenopathy, no carotid bruit, no JVD, supple, symmetrical, trachea midline and thyroid not enlarged, symmetric, no tenderness/mass/nodules Back: symmetric, no curvature. ROM normal. No CVA tenderness. Lungs: clear to auscultation bilaterally Breasts: gyn Heart: regular rate and rhythm, S1, S2 normal, no  murmur, click, rub or gallop Abdomen: soft, non-tender; bowel sounds normal; no masses,  no organomegaly Pelvic: deferred--gyn Extremities: extremities normal, atraumatic, no cyanosis or edema Pulses: 2+ and symmetric Skin: Skin color, texture, turgor normal. No rashes or lesions Lymph nodes: Cervical, supraclavicular, and axillary nodes normal. Neurologic: Alert and oriented X 3, normal strength and tone. Normal symmetric reflexes. Normal coordination and gait    Assessment:    Healthy female exam.      Plan:     hm utd Check labs See After Visit Summary for Counseling Recommendations    1. Ischemic heart disease screen Check labs  - Lipid panel - CBC with Differential/Platelet - Comprehensive metabolic panel - TSH - POCT Urinalysis Dipstick (Automated)  2. History of thyroid nodule Check labs  US done last year  - Lipid panel - CBC with Differential/Platelet - Comprehensive metabolic panel - TSH - POCT Urinalysis Dipstick (Automated)  3. Vitamin D deficiency  - VITAMIN D 25 Hydroxy (Vit-D Deficiency, Fractures)  4. History of bariatric surgery  - B12 and Folate Panel

## 2020-08-28 ENCOUNTER — Ambulatory Visit: Payer: Medicare Other

## 2020-09-04 ENCOUNTER — Other Ambulatory Visit: Payer: Self-pay

## 2020-09-04 ENCOUNTER — Telehealth (INDEPENDENT_AMBULATORY_CARE_PROVIDER_SITE_OTHER): Payer: Medicare Other | Admitting: Medical

## 2020-09-04 DIAGNOSIS — J40 Bronchitis, not specified as acute or chronic: Secondary | ICD-10-CM | POA: Diagnosis not present

## 2020-09-04 DIAGNOSIS — R0981 Nasal congestion: Secondary | ICD-10-CM | POA: Diagnosis not present

## 2020-09-04 DIAGNOSIS — R059 Cough, unspecified: Secondary | ICD-10-CM | POA: Diagnosis not present

## 2020-09-04 DIAGNOSIS — J040 Acute laryngitis: Secondary | ICD-10-CM | POA: Diagnosis not present

## 2020-09-04 MED ORDER — BENZONATATE 100 MG PO CAPS: ORAL_CAPSULE | ORAL | 0 refills | Status: DC

## 2020-09-04 MED ORDER — FLUTICASONE PROPIONATE 50 MCG/ACT NA SUSP
2.0000 | Freq: Every day | NASAL | 1 refills | Status: DC
Start: 2020-09-04 — End: 2021-02-20

## 2020-09-04 MED ORDER — ALBUTEROL SULFATE HFA 108 (90 BASE) MCG/ACT IN AERS
2.0000 | INHALATION_SPRAY | Freq: Four times a day (QID) | RESPIRATORY_TRACT | 0 refills | Status: DC | PRN
Start: 2020-09-04 — End: 2021-02-20

## 2020-09-04 MED ORDER — AZITHROMYCIN 250 MG PO TABS
ORAL_TABLET | ORAL | 0 refills | Status: DC
Start: 2020-09-04 — End: 2020-12-15

## 2020-09-04 NOTE — Progress Notes (Signed)
   Subjective:    Patient ID: [name], female    DOB: [birth year], 64 y.o.   MRN: [id number]  HPI  Virtual Visit via Video Note  I connected with [name] on 09/04/20 at  4:20 PM EST by a video enabled telemedicine application and verified that I am speaking with the correct person using two identifiers.  Location: Patient: home Provider: office   I discussed the limitations of evaluation and management by telemedicine and the availability of in person appointments. The patient expressed understanding and agreed to proceed.  History of Present Illness: Pt in with recent illness symptoms since Wednesday. Has had both nasal congestion, chest congestion, productive cough and some runny nose. Since Saturday very hoarse.   Pt has some wheezing over the weekend.  Coughing is making hard to sleep.  No fever or sweats. Some chills. Some minimal body aches. She is very tired.  Pt has been vaccinated against covid. Has not had booster.       Observations/Objective: [name]-no acute distress, pleasant, oriented. Lungs- on inspection lungs appear unlabored. Neck- no tracheal deviation or jvd on inspection. Neuro- gross motor function appears intact.  Assessment and Plan: You appear to have bronchitis. Rest hydrate and tylenol for fever. I am prescribing cough medicine benzonate, and azithromycin antibiotic. For your nasal congestion you could use flonase steroid.   You should gradually get better. If not then notify us and would recommend a chest xray.  For caution sake did advise to get covid test today and repeat in 3 days.  Follow up in 7-10 days or as needed  Follow Up Instructions:    I discussed the assessment and treatment plan with the patient. The patient was provided an opportunity to ask questions and all were answered. The patient agreed with the plan and demonstrated an understanding of the instructions.   The patient was advised to call back or seek  an in-person evaluation if the symptoms worsen or if the condition fails to improve as anticipated.  Time spent with patient today was 25  minutes which consisted of chart review, discussing diagnosis, work up, treatment and documentation.   Esperanza Richters, PA-C   Review of Systems     Objective:   Physical Exam        Assessment & Plan:

## 2020-09-04 NOTE — Patient Instructions (Signed)
You appear to have bronchitis. Rest hydrate and tylenol for fever. I am prescribing cough medicine benzonate, and azithromycin antibiotic. For your nasal congestion you could use flonase steroid.   You should gradually get better. If not then notify us and would recommend a chest xray.  For caution sake did advise to get covid test today and repeat in 3 days.  Follow up in 7-10 days or as needed

## 2020-09-06 ENCOUNTER — Ambulatory Visit (HOSPITAL_BASED_OUTPATIENT_CLINIC_OR_DEPARTMENT_OTHER): Payer: Medicare Other

## 2020-09-06 DIAGNOSIS — Z20822 Contact with and (suspected) exposure to covid-19: Secondary | ICD-10-CM | POA: Diagnosis not present

## 2020-09-07 DIAGNOSIS — G4733 Obstructive sleep apnea (adult) (pediatric): Secondary | ICD-10-CM | POA: Diagnosis not present

## 2020-09-14 ENCOUNTER — Other Ambulatory Visit: Payer: Self-pay

## 2020-09-14 ENCOUNTER — Ambulatory Visit: Payer: Medicare Other | Attending: Internal Medicine

## 2020-09-14 ENCOUNTER — Ambulatory Visit (HOSPITAL_BASED_OUTPATIENT_CLINIC_OR_DEPARTMENT_OTHER)
Admission: RE | Admit: 2020-09-14 | Discharge: 2020-09-14 | Disposition: A | Discharge status: Home / Self Care | Payer: Medicare Other | Source: Ambulatory Visit | Attending: Medical | Admitting: Medical

## 2020-09-14 DIAGNOSIS — R059 Cough, unspecified: Secondary | ICD-10-CM | POA: Diagnosis not present

## 2020-09-14 DIAGNOSIS — J40 Bronchitis, not specified as acute or chronic: Secondary | ICD-10-CM

## 2020-09-14 DIAGNOSIS — Z23 Encounter for immunization: Secondary | ICD-10-CM

## 2020-09-14 NOTE — Progress Notes (Signed)
   Covid-19 Vaccination Clinic  Name:  [name]    MRN: [id number] DOB: Jan 31, 1956  09/14/2020  [name] was observed post Covid-19 immunization for 15 minutes without incident. She was provided with Vaccine Information Sheet and instruction to access the V-Safe system.   [name] was instructed to call 911 with any severe reactions post vaccine: Marland Kitchen Difficulty breathing  . Swelling of face and throat  . A fast heartbeat  . A bad rash all over body  . Dizziness and weakness   Immunizations Administered    Name Date Dose VIS Date Route   Pfizer COVID-19 Vaccine 09/14/2020  1:32 PM 0.3 mL 07/19/2020 Intramuscular   Manufacturer: [employer], [employer]   Lot: [id number]   NDC: [address]-1

## 2020-09-19 DIAGNOSIS — G4733 Obstructive sleep apnea (adult) (pediatric): Secondary | ICD-10-CM | POA: Diagnosis not present

## 2020-10-04 ENCOUNTER — Other Ambulatory Visit: Payer: Self-pay | Admitting: Family Medicine

## 2020-10-04 DIAGNOSIS — K219 Gastro-esophageal reflux disease without esophagitis: Secondary | ICD-10-CM

## 2020-10-06 ENCOUNTER — Telehealth: Payer: Self-pay | Admitting: Gastroenterology

## 2020-10-06 NOTE — Telephone Encounter (Signed)
Pt would like to speak with nurse about some medical concern related to a possible bowel leakage. Pls call her.

## 2020-10-06 NOTE — Telephone Encounter (Signed)
Spoke with patient, she states that over the last month or so she has noticed that she will have a bowel movement and will clean up really well, she states that when she returns to the restroom about an hour or so later she will notice stool in her undergarments. Patient states that she can't feel anything come out but notices it when she goes to the restroom, patient states this may also happen if she passes gas. Advised patient that she will need to come in for evaluation, she has been scheduled on Tuesday, 10/17/2020 at 8:10 AM. Patient verbalized understanding and had no other concerns at the end of the call.

## 2020-10-07 ENCOUNTER — Other Ambulatory Visit: Payer: Self-pay | Admitting: Family Medicine

## 2020-10-07 DIAGNOSIS — K219 Gastro-esophageal reflux disease without esophagitis: Secondary | ICD-10-CM

## 2020-10-09 DIAGNOSIS — M47816 Spondylosis without myelopathy or radiculopathy, lumbar region: Secondary | ICD-10-CM | POA: Diagnosis not present

## 2020-10-09 DIAGNOSIS — S76012D Strain of muscle, fascia and tendon of left hip, subsequent encounter: Secondary | ICD-10-CM | POA: Diagnosis not present

## 2020-10-09 DIAGNOSIS — Z96642 Presence of left artificial hip joint: Secondary | ICD-10-CM | POA: Diagnosis not present

## 2020-10-09 DIAGNOSIS — M5416 Radiculopathy, lumbar region: Secondary | ICD-10-CM | POA: Diagnosis not present

## 2020-10-27 ENCOUNTER — Ambulatory Visit: Payer: Medicare Other | Admitting: Gastroenterology

## 2020-11-08 DIAGNOSIS — G4733 Obstructive sleep apnea (adult) (pediatric): Secondary | ICD-10-CM | POA: Diagnosis not present

## 2020-12-06 DIAGNOSIS — G4733 Obstructive sleep apnea (adult) (pediatric): Secondary | ICD-10-CM | POA: Diagnosis not present

## 2020-12-15 ENCOUNTER — Ambulatory Visit (INDEPENDENT_AMBULATORY_CARE_PROVIDER_SITE_OTHER): Payer: Medicare Other | Admitting: Family Medicine

## 2020-12-15 ENCOUNTER — Encounter: Payer: Self-pay | Admitting: Family Medicine

## 2020-12-15 ENCOUNTER — Other Ambulatory Visit: Payer: Self-pay

## 2020-12-15 VITALS — BP 112/80 | HR 82 | Temp 98.1°F | Wt 195.8 lb

## 2020-12-15 DIAGNOSIS — H6981 Other specified disorders of Eustachian tube, right ear: Secondary | ICD-10-CM

## 2020-12-15 DIAGNOSIS — M2140 Flat foot [pes planus] (acquired), unspecified foot: Secondary | ICD-10-CM | POA: Diagnosis not present

## 2020-12-15 MED ORDER — PREDNISONE 20 MG PO TABS: 40.0000 mg | ORAL_TABLET | Freq: Every day | ORAL | 0 refills | Status: AC

## 2020-12-15 NOTE — Progress Notes (Signed)
Chief Complaint  Patient presents with  . Ear Pain    Right ear   . itching in ear  . ringing right ear    Pt is here for right ear pain. Duration: 3 days Progression: unchanged Associated symptoms: ear pressure Denies: sore throat, sneezing, achiness an fevers, bleeding, or discharge from ear Treatment to date: some ear drops from husband, unsure what they are  Tingling/numbness in 2nd-5th toes on R only. No inj or change in activity. She does not always wear supportive shoes outside of exercise. Bothers mainly at night. No balance issues or weakness. No skin changes. She does not have diabetes.   Past Medical History:  Diagnosis Date  . Abdominal pain, other specified site 03/31/2008   Centricity Description: FLANK PAIN, RIGHT Qualifier: Diagnosis of  By: Alwyn Ren MD, Chrissie Noa   Centricity Description: ABDOMINAL PAIN OTHER SPECIFIED SITE Qualifier: Diagnosis of  By: Janit Bern    . Acute sprain or strain of cervical region 04/10/2012  . ALLERGIC RHINITIS 06/26/2007   Qualifier: Diagnosis of  By: Charlsie Quest RMA, Lucy    . ANKYLOSING SPONDYLITIS 03/31/2008   Qualifier: Diagnosis of  By: Alwyn Ren MD, Chrissie Noa    . Ankylosing spondylitis (HCC)    neck and spin  . Ankylosing spondylitis of multiple sites in spine (HCC)   . Arthritis   . Back pain   . Bariatric surgery status 01/14/2008   Qualifier: Diagnosis of  By: Janit Bern    . Blood in stool 12/02/2008   Qualifier: Diagnosis of  By: Janit Bern    . Blood transfusion without reported diagnosis   . Calf pain 10/13/2012  . CHEST PAIN UNSPECIFIED 10/11/2009   Qualifier: Diagnosis of  By: Arlyce Dice MD, Barbette Hair   . CONSTIPATION 04/06/2010   Qualifier: Diagnosis of  By: Janit Bern    . CORNEAL ABRASION, LEFT 01/22/2010   Qualifier: Diagnosis of  By: Janit Bern    . Gastritis   . GERD 06/26/2007   Qualifier: Diagnosis of  By: Charlsie Quest RMA, Lucy    . GERD (gastroesophageal reflux disease)   . Hiatal hernia   . Hypertension     no medications now  . IBS 06/26/2007   Qualifier: Diagnosis of  By: Charlsie Quest RMA, Lucy    . Lactose intolerance   . LEG CRAMPS, NOCTURNAL 11/14/2010   Qualifier: Diagnosis of  By: Floydene Flock    . Long-term current use of steroids 04/20/2013  . LYMPHADENOPATHY 06/10/2007   Qualifier: Diagnosis of  By: Janit Bern    . MORBID OBESITY 06/26/2007   Qualifier: Diagnosis of  By: Charlsie Quest RMA, Lucy    . MUSCLE PAIN 03/31/2008   Qualifier: Diagnosis of  By: Alwyn Ren MD, Chrissie Noa    . Noninfectious gastroenteritis and colitis 03/17/2013  . Obesity (BMI 30-39.9) 03/16/2014  . OSA (obstructive sleep apnea) 03/08/2016  . 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 Rheumatoid arthritis (HCC)   . Seasonal allergies 05/14/2015  . THYROID CYST 06/26/2007   Qualifier: Diagnosis of  By: Charlsie Quest RMA, Lucy    . THYROID NODULE, HX OF 06/10/2007   Qualifier: Diagnosis of  By: Janit Bern    . Vitamin D deficiency     BP 112/80   Pulse 82   Temp 98.1 F (36.7 C)   Wt 195 lb 12.8 oz (88.8 kg)   SpO2 98%  BMI 35.81 kg/m  General: Awake, alert, appearing stated age HEENT:  L ear- Canal patent without drainage or erythema, TM is neg R ear- canal patent without drainage or erythema, TM is neg Nose- nares patent and without discharge Mouth- Lips, gums and dentition unremarkable, pharynx is without erythema or exudate Neck: No adenopathy MSK- no ttp over 2nd-5th digits on R foot. No deformity. Arch of foot flat on R.  Neuro- Sensation intact to light touch on feet. Gait normal.  Lungs: Normal effort, no accessory muscle use Psych: Age appropriate judgment and insight, normal mood and affect  Dysfunction of right eustachian tube - Plan: predniSONE (DELTASONE) 20 MG tablet  Pes planus, unspecified laterality  1. 5 d pred burst 40 mg/d. Cont INCS afterwards.  2. Arch supports rec'd.  Pt voiced understanding and agreement to the  plan.  Jilda Roche [place], DO 12/15/20 2:57 PM

## 2020-12-15 NOTE — Patient Instructions (Addendum)
Consider arch supports for your feet. Dr Margart Sickles is a cheaper option. Consider PowerStep. I think this is more likely positional.   Eustachian Tube Dysfunction  Eustachian tube dysfunction refers to a condition in which a blockage develops in the narrow passage that connects the middle ear to the back of the nose (eustachian tube). The eustachian tube regulates air pressure in the middle ear by letting air move between the ear and nose. It also helps to drain fluid from the middle ear space. Eustachian tube dysfunction can affect one or both ears. When the eustachian tube does not function properly, air pressure, fluid, or both can build up in the middle ear. What are the causes? This condition occurs when the eustachian tube becomes blocked or cannot open normally. Common causes of this condition include:  Ear infections.  Colds and other infections that affect the nose, mouth, and throat (upper respiratory tract).  Allergies.  Irritation from cigarette smoke.  Irritation from stomach acid coming up into the esophagus (gastroesophageal reflux). The esophagus is the tube that carries food from the mouth to the stomach.  Sudden changes in air pressure, such as from descending in an airplane or scuba diving.  Abnormal growths in the nose or throat, such as: ? Growths that line the nose (nasal polyps). ? Abnormal growth of cells (tumors). ? Enlarged tissue at the back of the throat (adenoids). What increases the risk? You are more likely to develop this condition if:  You smoke.  You are overweight.  You are a child who has: ? Certain birth defects of the mouth, such as cleft palate. ? Large tonsils or adenoids. What are the signs or symptoms? Common symptoms of this condition include:  A feeling of fullness in the ear.  Ear pain.  Clicking or popping noises in the ear.  Ringing in the ear.  Hearing loss.  Loss of balance.  Dizziness. Symptoms may get worse when the  air pressure around you changes, such as when you travel to an area of high elevation, fly on an airplane, or go scuba diving. How is this diagnosed? This condition may be diagnosed based on:  Your symptoms.  A physical exam of your ears, nose, and throat.  Tests, such as those that measure: ? The movement of your eardrum (tympanogram). ? Your hearing (audiometry). How is this treated? Treatment depends on the cause and severity of your condition.  In mild cases, you may relieve your symptoms by moving air into your ears. This is called "popping the ears."  In more severe cases, or if you have symptoms of fluid in your ears, treatment may include: ? Medicines to relieve congestion (decongestants). ? Medicines that treat allergies (antihistamines). ? Nasal sprays or ear drops that contain medicines that reduce swelling (steroids). ? A procedure to drain the fluid in your eardrum (myringotomy). In this procedure, a small tube is placed in the eardrum to:  Drain the fluid.  Restore the air in the middle ear space. ? A procedure to insert a balloon device through the nose to inflate the opening of the eustachian tube (balloon dilation). Follow these instructions at home: Lifestyle  Do not do any of the following until your health care provider approves: ? Travel to high altitudes. ? Fly in airplanes. ? Work in a Estate agent or room. ? Scuba dive.  Do not use any products that contain nicotine or tobacco, such as cigarettes and e-cigarettes. If you need help quitting, ask your health care  provider.  Keep your ears dry. Wear fitted earplugs during showering and bathing. Dry your ears completely after. General instructions  Take over-the-counter and prescription medicines only as told by your health care provider.  Use techniques to help pop your ears as recommended by your health care provider. These may include: ? Chewing gum. ? Yawning. ? Frequent, forceful  swallowing. ? Closing your mouth, holding your nose closed, and gently blowing as if you are trying to blow air out of your nose.  Keep all follow-up visits as told by your health care provider. This is important. Contact a health care provider if:  Your symptoms do not go away after treatment.  Your symptoms come back after treatment.  You are unable to pop your ears.  You have: ? A fever. ? Pain in your ear. ? Pain in your head or neck. ? Fluid draining from your ear.  Your hearing suddenly changes.  You become very dizzy.  You lose your balance. Summary  Eustachian tube dysfunction refers to a condition in which a blockage develops in the eustachian tube.  It can be caused by ear infections, allergies, inhaled irritants, or abnormal growths in the nose or throat.  Symptoms include ear pain, hearing loss, or ringing in the ears.  Mild cases are treated with maneuvers to unblock the ears, such as yawning or ear popping.  Severe cases are treated with medicines. Surgery may also be done (rare). This information is not intended to replace advice given to you by your health care provider. Make sure you discuss any questions you have with your health care provider. Document Revised: 01/06/2018 Document Reviewed: 01/06/2018 Elsevier Patient Education  2021 [employer].

## 2021-01-01 ENCOUNTER — Telehealth: Payer: Self-pay | Admitting: Internal Medicine

## 2021-01-01 DIAGNOSIS — G4733 Obstructive sleep apnea (adult) (pediatric): Secondary | ICD-10-CM

## 2021-01-01 NOTE — Telephone Encounter (Signed)
Called and spoke with patient. Dr. Roxy Cedar recommendations given. Understanding stated. DME order placed. Nothing further at this time.

## 2021-01-01 NOTE — Telephone Encounter (Signed)
Called and spoke with Patient. Patient stated she has been having vertigo symptoms and was wondering if it could be related to cpap pressure. Patient stated she just has a rocking, woosey, uneasy feeling, and she stumbles at times. Patient stated her PCP stated she may have a inner ear issue.   Allergies  Allergen Reactions  . Aspirin     REACTION: intolerance-gi upset  . Codeine Itching  . Morphine Itching  . Sulfonamide Derivatives     Unsure of reaction   Current Outpatient Medications on File Prior to Visit  Medication Sig Dispense Refill  . albuterol (VENTOLIN HFA) 108 (90 Base) MCG/ACT inhaler Inhale 2 puffs into the lungs every 6 (six) hours as needed. 18 g 0  . Biotin w/ Vitamins C & E (HAIR SKIN & NAILS GUMMIES PO) Take 1 each by mouth daily.    Marland Kitchen CALCIUM PO Take by mouth.    . Cholecalciferol (VITAMIN D) 50 MCG (2000 UT) CAPS Take 1 capsule by mouth daily.    . fluticasone (FLONASE) 50 MCG/ACT nasal spray Place 2 sprays into both nostrils daily. 16 g 1  . multivitamin-iron-minerals-folic acid (CENTRUM) chewable tablet Chew 1 tablet by mouth daily.    . polyethylene glycol powder (GLYCOLAX/MIRALAX) 17 GM/SCOOP powder Take 17 g by mouth 2 (two) times daily as needed. 3350 g 0  . Potassium 99 MG TABS Take 1 tablet by mouth daily.     . Vitamin D, Ergocalciferol, (DRISDOL) 1.25 MG (50000 UNIT) CAPS capsule Take 1 capsule (50,000 Units total) by mouth every 7 (seven) days. 12 capsule 0  . zinc gluconate 50 MG tablet Take 50 mg by mouth daily.     No current facility-administered medications on file prior to visit.   Message routed to Dr Maple Hudson to advise

## 2021-01-01 NOTE — Telephone Encounter (Signed)
Order- DME [facility], please change autopap range to 4-10  Suggest she try otc Meclizine for dizziness - she can ask pharmacist where to find it on shelf  If these don't help, suggest she ask her PCP to refer her to ENT

## 2021-01-04 ENCOUNTER — Telehealth: Payer: Self-pay | Admitting: Family Medicine

## 2021-01-04 DIAGNOSIS — H6981 Other specified disorders of Eustachian tube, right ear: Secondary | ICD-10-CM

## 2021-01-04 NOTE — Telephone Encounter (Signed)
Yes

## 2021-01-04 NOTE — Telephone Encounter (Signed)
Referral placed.

## 2021-01-04 NOTE — Telephone Encounter (Signed)
CallerSommer [name]  Call Back @ [phone]  Patient states she saw Dr Carmelia Roller a few week ago her ear pain and dizziness related to vertigo , patient is now requesting a referral to a ENT doctor. Patient is still having the dizziness.    Please advise

## 2021-01-04 NOTE — Telephone Encounter (Signed)
Okay to place referral

## 2021-01-06 DIAGNOSIS — G4733 Obstructive sleep apnea (adult) (pediatric): Secondary | ICD-10-CM | POA: Diagnosis not present

## 2021-01-09 DIAGNOSIS — R42 Dizziness and giddiness: Secondary | ICD-10-CM | POA: Diagnosis not present

## 2021-01-09 DIAGNOSIS — J343 Hypertrophy of nasal turbinates: Secondary | ICD-10-CM | POA: Diagnosis not present

## 2021-01-09 DIAGNOSIS — H6981 Other specified disorders of Eustachian tube, right ear: Secondary | ICD-10-CM | POA: Diagnosis not present

## 2021-02-02 ENCOUNTER — Ambulatory Visit: Payer: Medicare Other | Admitting: Family Medicine

## 2021-02-05 DIAGNOSIS — G4733 Obstructive sleep apnea (adult) (pediatric): Secondary | ICD-10-CM | POA: Diagnosis not present

## 2021-02-08 DIAGNOSIS — Z1231 Encounter for screening mammogram for malignant neoplasm of breast: Secondary | ICD-10-CM | POA: Diagnosis not present

## 2021-02-08 DIAGNOSIS — Z13820 Encounter for screening for osteoporosis: Secondary | ICD-10-CM | POA: Diagnosis not present

## 2021-02-09 NOTE — Progress Notes (Signed)
HPI F never smoker followed for OSA, complicated by  HTN, GERD, IBS, Thyroid Cyst, Ankylosing Spondylitis, Urticaria, Morbid  Obesity s/p Bariatric Surgery, Allergic Rhinitis NPSG 03/07/16- AHI 15.7/ hr, desaturation to 84%, body weight 194 lbs HST 04/24/20- AHI 24.1/ hr, desaturation to 80%, body weight 202 lbs  =======================================================   08/15/20- 63 yoF never smoker followed for OSA, complicated by  HTN, GERD, IBS, Thyroid Cyst, Ankylosing Spondylitis, Urticaria, Morbid  Obesity s/p Bariatric Surgery, Allergic Rhinitis HST 04/24/20- AHI 24.1/ hr, desaturation to 80%, body weight 202 lbs CPAP auto 4-12/ [facility] Download- compliance 91%, AHI 1/ hr Body weight today- 202 lbs Covid vax- 2 Phizer Flu vax- Declined ------pt states settings need to be adjusted. pt here for cpap compliance She is settling into CPAP use. Nasal pillows mask. Oral venting if she rolls onto back.We reviewed download and she agrees to keep current pressure range. Sleeping better.  CXR 04/06/20- IMPRESSION: No acute cardiopulmonary disease.  02/12/21- 64 yoF never smoker followed for OSA, complicated by  HTN, GERD, IBS, Thyroid Cyst, Ankylosing Spondylitis, Urticaria, Morbid  Obesity s/p Bariatric Surgery, Allergic Rhinitis CPAP auto 4-10/ [facility] Download-compliance 70%, AHI 0.8/ hr Body weight today-192 lbs Covid vax- 3 Phizer Weight down, working with Systems analyst.  Discussed weight loss impact on OSA. Noting some vertigo- has meclizine. CXR 09/14/20-  IMPRESSION: No acute cardiopulmonary disease.   ROS-see HPI   + = positive Constitutional:    weight loss, night sweats, fevers, chills, fatigue, lassitude. HEENT:    headaches, difficulty swallowing, tooth/dental problems, sore throat,       sneezing, itching, ear ache, nasal congestion, post nasal drip, snoring CV:    chest pain, orthopnea, PND, swelling in lower extremities, anasarca,                                   dizziness,  +palpitations Resp:   shortness of breath with exertion or at rest.                productive cough,   non-productive cough, coughing up of blood.              change in color of mucus.  wheezing.   Skin:    rash or lesions. GI:  +-   Heartburn,+ indigestion, abdominal pain, nausea, vomiting, diarrhea,                 change in bowel habits, loss of appetite GU: dysuria, change in color of urine, no urgency or frequency.   flank pain. MS:   +joint pain, stiffness, decreased range of motion, +back pain. Neuro-     nothing unusual Psych:  change in mood or affect.  depression or anxiety.   memory loss.  OBJ- Physical Exam General- Alert, Oriented, Affect-appropriate, Distress- none acute., + obese Skin- rash-none, lesions- none, excoriation- none Lymphadenopathy- none Head- atraumatic            Eyes- Gross vision intact, PERRLA, conjunctivae and secretions clear            Ears- Hearing, canals-normal            Nose- Clear, no-Septal dev, mucus, polyps, erosion, perforation             Throat- Mallampati IV , mucosa clear , drainage- none, tonsils- atrophic, + teeth Neck- flexible , trachea midline, no stridor , thyroid nl, carotid no bruit Chest - symmetrical excursion ,  unlabored           Heart/CV- RRR , no murmur , no gallop  , no rub, nl s1 s2                           - JVD- none , edema- none, stasis changes- none, varices- none           Lung- clear to P&A, wheeze- none, cough- none , dullness-none, rub- none           Chest wall-  Abd-  Br/ Gen/ Rectal- Not done, not indicated Extrem- cyanosis- none, clubbing, none, atrophy- none, strength- nl Neuro- grossly intact to observation

## 2021-02-12 ENCOUNTER — Other Ambulatory Visit: Payer: Self-pay

## 2021-02-12 ENCOUNTER — Encounter: Payer: Self-pay | Admitting: Internal Medicine

## 2021-02-12 ENCOUNTER — Ambulatory Visit: Payer: Medicare Other | Admitting: Internal Medicine

## 2021-02-12 DIAGNOSIS — G4733 Obstructive sleep apnea (adult) (pediatric): Secondary | ICD-10-CM | POA: Diagnosis not present

## 2021-02-12 DIAGNOSIS — E669 Obesity, unspecified: Secondary | ICD-10-CM

## 2021-02-12 MED ORDER — SPIRIVA RESPIMAT 2.5 MCG/ACT IN AERS: 2.0000 | INHALATION_SPRAY | Freq: Every day | RESPIRATORY_TRACT | 0 refills | Status: DC

## 2021-02-12 NOTE — Patient Instructions (Signed)
We can continue CPAP auto 4-10   Please call if we can help

## 2021-02-14 ENCOUNTER — Telehealth: Payer: Self-pay | Admitting: Gastroenterology

## 2021-02-14 NOTE — Telephone Encounter (Signed)
Inbound call from patient requesting a call from a nurse please.  States she has had changes in her bowel habits for the past 2 months.

## 2021-02-14 NOTE — Telephone Encounter (Signed)
Spoke with patient, she states that she has had a change in her bowel movements. She describes her stools as flat. No BRB, no change in diet or medications. Patient has been scheduled to see Dr. Adela Lank on Thursday, 02/15/21 at 3:40 PM. Patient had no concerns at the end of the call.  Lucius Conn, CMA is aware that the patient has been added to the schedule.

## 2021-02-15 ENCOUNTER — Encounter: Payer: Self-pay | Admitting: Gastroenterology

## 2021-02-15 ENCOUNTER — Ambulatory Visit: Payer: Medicare Other | Admitting: Gastroenterology

## 2021-02-15 ENCOUNTER — Other Ambulatory Visit: Payer: Self-pay

## 2021-02-15 VITALS — BP 120/72 | HR 75 | Wt 192.1 lb

## 2021-02-15 DIAGNOSIS — N393 Stress incontinence (female) (male): Secondary | ICD-10-CM

## 2021-02-15 DIAGNOSIS — R151 Fecal smearing: Secondary | ICD-10-CM

## 2021-02-15 DIAGNOSIS — R194 Change in bowel habit: Secondary | ICD-10-CM

## 2021-02-15 NOTE — Patient Instructions (Signed)
If you are age 63 or older, your body mass index should be between 23-30. Your Body mass index is 35.14 kg/m. If this is out of the aforementioned range listed, please consider follow up with your Primary Care Provider.  If you are age 102 or younger, your body mass index should be between 19-25. Your Body mass index is 35.14 kg/m. If this is out of the aformentioned range listed, please consider follow up with your Primary Care Provider.   Continue fiber daily.  We are referring you to Pelvic Floor Physical Therapy.  They will contact you directly to schedule an appointment.  It may take a week or more;  Please feel free to contact us if you have not heard from them within 2 weeks and we will follow up.   Thank you for entrusting me with your care and for choosing [facility], Dr. Ileene Patrick

## 2021-02-15 NOTE — Progress Notes (Signed)
HPI :  65 year old female here for follow-up visit for altered bowel habits.  I last saw her during her colonoscopy in July of last year.  She had no polyps noted on that exam.  She states over the past 3 months or so her stool form had changed, stools are thin in caliber.  She also has a sense of incomplete evacuation and that she cannot get all the stool out.  She has tried using Metamucil which has helped to restore the stool form but has not resolved her symptoms.  She is using Dulcolax suppositories as needed to get stool out.  No blood in her stool.  No abdominal pains.  She does have a history of urinary leakage/incontinence.  She also has some leakage of stool at times after she has a bowel movement.  She denies any recent pelvic surgeries.  She has not had any rectal pain.     Colonoscopy 04/13/20 - The perianal and digital rectal examinations were normal. - There was a small lipoma, in the ascending colon. - A few small-mouthed diverticula were found in the sigmoid colon. - The exam was otherwise without abnormality.  EGD 09/02/2018 -  - A 1 cm hiatal hernia was present. - The exam of the esophagus was otherwise normal. No erosive changes or Barrett's - Evidence of a Roux-en-Y gastrojejunostomy was found. The gastrojejunal anastomosis was characterized by visible sutures (3 areas), one of which was loose and attached to a visible staple. The suture and visible staple was removed with a cold forceps. There was otherwise no ulceration or abnormality with the gastric pouch. Retroflexed views of the pouch not obtained due to small size. - The examined small bowel limb was normal.       Past Medical History:  Diagnosis Date  . Abdominal pain, other specified site 03/31/2008   Centricity Description: FLANK PAIN, RIGHT Qualifier: Diagnosis of  By: Alwyn Ren MD, Chrissie Noa   Centricity Description: ABDOMINAL PAIN OTHER SPECIFIED SITE Qualifier: Diagnosis of  By: Janit Bern    . Acute  sprain or strain of cervical region 04/10/2012  . ALLERGIC RHINITIS 06/26/2007   Qualifier: Diagnosis of  By: Charlsie Quest RMA, Lucy    . ANKYLOSING SPONDYLITIS 03/31/2008   Qualifier: Diagnosis of  By: Alwyn Ren MD, Chrissie Noa    . Ankylosing spondylitis (HCC)    neck and spin  . Ankylosing spondylitis of multiple sites in spine (HCC)   . Arthritis   . Back pain   . Bariatric surgery status 01/14/2008   Qualifier: Diagnosis of  By: Janit Bern    . Blood in stool 12/02/2008   Qualifier: Diagnosis of  By: Janit Bern    . Blood transfusion without reported diagnosis   . Calf pain 10/13/2012  . CHEST PAIN UNSPECIFIED 10/11/2009   Qualifier: Diagnosis of  By: Arlyce Dice MD, Barbette Hair   . CONSTIPATION 04/06/2010   Qualifier: Diagnosis of  By: Janit Bern    . CORNEAL ABRASION, LEFT 01/22/2010   Qualifier: Diagnosis of  By: Janit Bern    . Gastritis   . GERD 06/26/2007   Qualifier: Diagnosis of  By: Charlsie Quest RMA, Lucy    . GERD (gastroesophageal reflux disease)   . Hiatal hernia   . Hypertension    no medications now  . IBS 06/26/2007   Qualifier: Diagnosis of  By: Charlsie Quest RMA, Lucy    . Lactose intolerance   . LEG CRAMPS, NOCTURNAL 11/14/2010   Qualifier: Diagnosis of  By: Floydene Flock    . Long-term current use of steroids 04/20/2013  . LYMPHADENOPATHY 06/10/2007   Qualifier: Diagnosis of  By: Janit Bern    . MORBID OBESITY 06/26/2007   Qualifier: Diagnosis of  By: Charlsie Quest RMA, Lucy    . MUSCLE PAIN 03/31/2008   Qualifier: Diagnosis of  By: Alwyn Ren MD, Chrissie Noa    . Noninfectious gastroenteritis and colitis 03/17/2013  . Obesity (BMI 30-39.9) 03/16/2014  . OSA (obstructive sleep apnea) 03/08/2016  . 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 Rheumatoid arthritis (HCC)   . Seasonal allergies 05/14/2015  . THYROID CYST 06/26/2007   Qualifier: Diagnosis of  By: Charlsie Quest RMA, Lucy    . THYROID NODULE, HX OF 06/10/2007   Qualifier:  Diagnosis of  By: Janit Bern    . Vitamin D deficiency      Past Surgical History:  Procedure Laterality Date  . BARIATRIC SURGERY    . BREAST REDUCTION SURGERY  1996  . COLONOSCOPY  10/24/2009   kaplan-normal exam  . ESOPHAGOGASTRODUODENOSCOPY    . pelvis replacement  2010  . TOTAL HIP ARTHROPLASTY     x 2 left, one revision  . UPPER GASTROINTESTINAL ENDOSCOPY     Family History  Problem Relation Age of Onset  . Hypertension Mother   . High Cholesterol Mother   . Diabetes Mother   . Arthritis Father   . Hypertension Father   . Diabetes Father   . High Cholesterol Father   . Melanoma Maternal Grandmother   . Breast cancer Maternal Grandmother        and Aunt  . Hypertension Sister   . Diabetes Sister   . Colon cancer Neg Hx   . Esophageal cancer Neg Hx   . Rectal cancer Neg Hx   . Stomach cancer Neg Hx   . Colon polyps Neg Hx    Social History   Tobacco Use  . Smoking status: Never Smoker  . Smokeless tobacco: Never Used  Vaping Use  . Vaping Use: Never used  Substance Use Topics  . Alcohol use: No    Alcohol/week: 0.0 standard drinks  . Drug use: No   Current Outpatient Medications  Medication Sig Dispense Refill  . albuterol (VENTOLIN HFA) 108 (90 Base) MCG/ACT inhaler Inhale 2 puffs into the lungs every 6 (six) hours as needed. 18 g 0  . Biotin w/ Vitamins C & E (HAIR SKIN & NAILS GUMMIES PO) Take 1 each by mouth daily.    Marland Kitchen CALCIUM PO Take by mouth.    . Cholecalciferol (VITAMIN D) 50 MCG (2000 UT) CAPS Take 1 capsule by mouth daily.    . fluticasone (FLONASE) 50 MCG/ACT nasal spray Place 2 sprays into both nostrils daily. 16 g 1  . multivitamin-iron-minerals-folic acid (CENTRUM) chewable tablet Chew 1 tablet by mouth daily.    . polyethylene glycol powder (GLYCOLAX/MIRALAX) 17 GM/SCOOP powder Take 17 g by mouth 2 (two) times daily as needed. 3350 g 0  . Potassium 99 MG TABS Take 1 tablet by mouth daily.     . Tiotropium Bromide Monohydrate  (SPIRIVA RESPIMAT) 2.5 MCG/ACT AERS Inhale 2 puffs into the lungs daily. 4 g 0  . Vitamin D, Ergocalciferol, (DRISDOL) 1.25 MG (50000 UNIT) CAPS capsule Take 1 capsule (50,000 Units total) by mouth every 7 (seven) days. 12 capsule 0   No current facility-administered medications for this visit.  Allergies  Allergen Reactions  . Aspirin     REACTION: intolerance-gi upset  . Codeine Itching  . Morphine Itching  . Sulfonamide Derivatives     Unsure of reaction     Review of Systems: All systems reviewed and negative except where noted in HPI.    Lab Results  Component Value Date   WBC 6.6 08/22/2020   HGB 13.4 08/22/2020   HCT 41.0 08/22/2020   MCV 96.2 08/22/2020   PLT 347.0 08/22/2020    Lab Results  Component Value Date   CREATININE 0.73 08/22/2020   BUN 12 08/22/2020   NA 141 08/22/2020   K 4.1 08/22/2020   CL 103 08/22/2020   CO2 29 08/22/2020    Lab Results  Component Value Date   ALT 18 08/22/2020   AST 18 08/22/2020   ALKPHOS 65 08/22/2020   BILITOT 0.5 08/22/2020    Physical Exam: BP 120/72   Pulse 75   Wt 192 lb 1.6 oz (87.1 kg)   SpO2 99%   BMI 35.14 kg/m  Constitutional: Pleasant,well-developed, female in no acute distress. DRE - normal sensation, normal testing tone, squeeze slightly low, some paradoxical contraction with push test - Lucius Conn CMA Extremities: no edema Lymphadenopathy: No cervical adenopathy noted. Neurological: Alert and oriented to person place and time. Skin: Skin is warm and dry. No rashes noted. Psychiatric: Normal mood and affect. Behavior is normal.   ASSESSMENT AND PLAN: 65 year old female here for reassessment of following:  Altered bowel habits Fecal soiling Urinary incontinence  As above few months worth of symptoms that are bothersome to her, namely incomplete stool evacuation and difficulty with a bowel movement.  Daily fiber supplement has helped to normalize her stool form but she continues to struggle  with incomplete emptying.  She is also had some fecal soiling as well as urinary incontinence.  Suspect she may have some pelvic floor dyssynergia on DRE today.  I discussed what this is with her.  Recommend referral to pelvic floor PT to further evaluate this.  I think she should continue her fiber supplement daily otherwise and that is provided help with the stool form.  Hopefully pelvic floor PT can help with the symptoms, if not she should let me know.  Reassured her her colonoscopy is recent without any concerning findings.  Ileene Patrick, MD [facility]

## 2021-02-20 ENCOUNTER — Ambulatory Visit (INDEPENDENT_AMBULATORY_CARE_PROVIDER_SITE_OTHER): Payer: Medicare Other | Admitting: Bariatrics

## 2021-02-20 ENCOUNTER — Other Ambulatory Visit: Payer: Self-pay

## 2021-02-20 ENCOUNTER — Encounter (INDEPENDENT_AMBULATORY_CARE_PROVIDER_SITE_OTHER): Payer: Self-pay | Admitting: Bariatrics

## 2021-02-20 VITALS — BP 131/84 | HR 76 | Temp 98.5°F | Ht 62.0 in | Wt 191.0 lb

## 2021-02-20 DIAGNOSIS — K219 Gastro-esophageal reflux disease without esophagitis: Secondary | ICD-10-CM

## 2021-02-20 DIAGNOSIS — R0602 Shortness of breath: Secondary | ICD-10-CM | POA: Diagnosis not present

## 2021-02-20 DIAGNOSIS — Z9884 Bariatric surgery status: Secondary | ICD-10-CM

## 2021-02-20 DIAGNOSIS — R7309 Other abnormal glucose: Secondary | ICD-10-CM

## 2021-02-20 DIAGNOSIS — Z6835 Body mass index (BMI) 35.0-35.9, adult: Secondary | ICD-10-CM | POA: Diagnosis not present

## 2021-02-20 DIAGNOSIS — Z0289 Encounter for other administrative examinations: Secondary | ICD-10-CM

## 2021-02-20 DIAGNOSIS — Z1331 Encounter for screening for depression: Secondary | ICD-10-CM | POA: Diagnosis not present

## 2021-02-20 DIAGNOSIS — G4733 Obstructive sleep apnea (adult) (pediatric): Secondary | ICD-10-CM

## 2021-02-20 DIAGNOSIS — K5792 Diverticulitis of intestine, part unspecified, without perforation or abscess without bleeding: Secondary | ICD-10-CM | POA: Insufficient documentation

## 2021-02-20 DIAGNOSIS — I10 Essential (primary) hypertension: Secondary | ICD-10-CM | POA: Diagnosis not present

## 2021-02-20 DIAGNOSIS — R5383 Other fatigue: Secondary | ICD-10-CM | POA: Diagnosis not present

## 2021-02-20 DIAGNOSIS — R6889 Other general symptoms and signs: Secondary | ICD-10-CM | POA: Diagnosis not present

## 2021-02-21 LAB — COMPREHENSIVE METABOLIC PANEL
ALT: 17 IU/L (ref 0–32)
AST: 21 IU/L (ref 0–40)
Albumin/Globulin Ratio: 1.4 (ref 1.2–2.2)
Albumin: 4.2 g/dL (ref 3.8–4.8)
Alkaline Phosphatase: 93 IU/L (ref 44–121)
BUN/Creatinine Ratio: 17 (ref 12–28)
BUN: 11 mg/dL (ref 8–27)
Bilirubin Total: 0.6 mg/dL (ref 0.0–1.2)
CO2: 21 mmol/L (ref 20–29)
Calcium: 9.3 mg/dL (ref 8.7–10.3)
Chloride: 103 mmol/L (ref 96–106)
Creatinine, Ser: 0.66 mg/dL (ref 0.57–1.00)
Globulin, Total: 2.9 g/dL (ref 1.5–4.5)
Glucose: 82 mg/dL (ref 65–99)
Potassium: 4.4 mmol/L (ref 3.5–5.2)
Sodium: 145 mmol/L — ABNORMAL HIGH (ref 134–144)
Total Protein: 7.1 g/dL (ref 6.0–8.5)
eGFR: 99 mL/min/{1.73_m2} (ref >59)

## 2021-02-21 LAB — CBC WITH DIFFERENTIAL/PLATELET
Basophils Absolute: 0 10*3/uL (ref 0.0–0.2)
Basos: 1 %
EOS (ABSOLUTE): 0.1 10*3/uL (ref 0.0–0.4)
Eos: 1 %
Hematocrit: 39.4 % (ref 34.0–46.6)
Hemoglobin: 13 g/dL (ref 11.1–15.9)
Immature Grans (Abs): 0 10*3/uL (ref 0.0–0.1)
Immature Granulocytes: 0 %
Lymphocytes Absolute: 1.7 10*3/uL (ref 0.7–3.1)
Lymphs: 41 %
MCH: 30.8 pg (ref 26.6–33.0)
MCHC: 33 g/dL (ref 31.5–35.7)
MCV: 93 fL (ref 79–97)
Monocytes Absolute: 0.4 10*3/uL (ref 0.1–0.9)
Monocytes: 10 %
Neutrophils Absolute: 2 10*3/uL (ref 1.4–7.0)
Neutrophils: 47 %
Platelets: 309 10*3/uL (ref 150–450)
RBC: 4.22 x10E6/uL (ref 3.77–5.28)
RDW: 12.3 % (ref 11.7–15.4)
WBC: 4.2 10*3/uL (ref 3.4–10.8)

## 2021-02-21 LAB — T4, FREE: Free T4: 0.98 ng/dL (ref 0.82–1.77)

## 2021-02-21 LAB — LIPID PANEL WITH LDL/HDL RATIO
Cholesterol, Total: 161 mg/dL (ref 100–199)
HDL: 86 mg/dL (ref >39)
LDL Chol Calc (NIH): 65 mg/dL (ref 0–99)
LDL/HDL Ratio: 0.8 ratio (ref 0.0–3.2)
Triglycerides: 46 mg/dL (ref 0–149)
VLDL Cholesterol Cal: 10 mg/dL (ref 5–40)

## 2021-02-21 LAB — INSULIN, RANDOM: INSULIN: 3.5 u[IU]/mL (ref 2.6–24.9)

## 2021-02-21 LAB — TSH: TSH: 1.66 u[IU]/mL (ref 0.450–4.500)

## 2021-02-21 LAB — T3: T3, Total: 112 ng/dL (ref 71–180)

## 2021-02-21 LAB — VITAMIN B12: Vitamin B-12: 773 pg/mL (ref 232–1245)

## 2021-02-21 LAB — HEMOGLOBIN A1C
Est. average glucose Bld gHb Est-mCnc: 111 mg/dL
Hgb A1c MFr Bld: 5.5 % (ref 4.8–5.6)

## 2021-02-21 LAB — VITAMIN D 25 HYDROXY (VIT D DEFICIENCY, FRACTURES): Vit D, 25-Hydroxy: 31 ng/mL (ref 30.0–100.0)

## 2021-02-21 NOTE — Progress Notes (Signed)
Chief Complaint:   OBESITY Courtney GeneralBeverly L [name] (MR# [id number]) is a 65 y.o. female who presents for evaluation and treatment of obesity and related comorbidities. Current BMI is Body mass index is 34.93 kg/m. [name] has been struggling with her weight for many years and has been unsuccessful in either losing weight, maintaining weight loss, or reaching her healthy weight goal.  [name] is currently in the action stage of change and ready to dedicate time achieving and maintaining a healthier weight. [name] is interested in becoming our patient and working on intensive lifestyle modifications including (but not limited to) diet and exercise for weight loss.  [name] is a returning pt and had Dr. Dalbert GarnetBeasley and Dr Lawson RadarUkleja in the past. Her last visit was 07/27/2020 with Adah Salvageawn Whitmire, NP. She is lactose intolerant.  Lougenia's habits were reviewed today and are as follows: Her family eats meals together, she thinks her family will eat healthier with her, her desired weight loss is 43 lbs, she started gaining weight over the last 10 years, her heaviest weight ever was 235 pounds, she has significant food cravings issues, she snacks frequently in the evenings, she skips meals frequently, she is frequently drinking liquids with calories, she frequently makes poor food choices, she frequently eats larger portions than normal and she struggles with emotional eating.  Depression Screen Herman's Food and Mood (modified PHQ-9) score was 6.  [facility]  Decreased Interest 0  Down, Depressed, Hopeless 0  PHQ - 2 Score 0  Altered sleeping 2  Tired, decreased energy 2  Change in appetite 1  Feeling bad or failure about yourself  0  Trouble concentrating 1  Moving slowly or fidgety/restless 0  Suicidal thoughts 0  PHQ-9 Score 6  Difficult doing work/chores Not difficult at all   Subjective:   1. Other fatigue [name] admits to daytime somnolence and denies waking up still  tired. Patent has a history of symptoms of daytime fatigue. [name] generally gets 6 hours of sleep per night, and states that she has generally restful sleep. Snoring is present. Apneic episodes are not present. Epworth Sleepiness Score is 3.  2. SOB (shortness of breath) on exertion [name] notes increasing shortness of breath with exercising and seems to be worsening over time with weight gain. She notes getting out of breath sooner with activity than she used to. This has gotten worse recently. [name] denies shortness of breath at rest or orthopnea.  3. Essential hypertension [name]'s BP is labile at OV.  4. Gastroesophageal reflux disease without esophagitis [name] is taking Nexium.  5. History of gastric bypass [name]'s highest weight is 235 lbs and lowest weight 135 lbs (2006).   6. OSA (obstructive sleep apnea) [name] wears a CPAP and reports restful sleep.  7. Elevated glucose [name] has a family history of type 2 diabetes.  Assessment/Plan:   1. Other fatigue [name] does feel that her weight is causing her energy to be lower than it should be. Fatigue may be related to obesity, depression or many other causes. Labs will be ordered, and in the meanwhile, [name] will focus on self care including making healthy food choices, increasing physical activity and focusing on stress reduction. Check labs today.  - EKG 12-Lead - Vitamin B12 - CBC with Differential/Platelet - Comprehensive metabolic panel - Hemoglobin A1c - Insulin, random - VITAMIN D 25 Hydroxy (Vit-D Deficiency, Fractures) - TSH - T4, free - T3 - Lipid Panel With LDL/HDL Ratio  2. SOB (  shortness of breath) on exertion [name] does feel that she gets out of breath more easily that she used to when she exercises. [name]'s shortness of breath appears to be obesity related and exercise induced. She has agreed to work on weight loss and gradually increase exercise to treat her exercise induced shortness of  breath. Will continue to monitor closely. Check labs today.  - Lipid Panel With LDL/HDL Ratio  3. Essential hypertension [name] is working on healthy weight loss and exercise to improve blood pressure control. We will watch for signs of hypotension as she continues her lifestyle modifications. Check labs today.  - Lipid Panel With LDL/HDL Ratio  4. Gastroesophageal reflux disease without esophagitis Intensive lifestyle modifications are the first line treatment for this issue. We discussed several lifestyle modifications today and she will continue to work on diet, exercise and weight loss efforts. Orders and follow up as documented in patient record.  -Continue Nexium  Counseling . If a person has gastroesophageal reflux disease (GERD), food and stomach acid move back up into the esophagus and cause symptoms or problems such as damage to the esophagus. . Anti-reflux measures include: raising the head of the bed, avoiding tight clothing or belts, avoiding eating late at night, not lying down shortly after mealtime, and achieving weight loss. . Avoid ASA, NSAID's, caffeine, alcohol, and tobacco.  . OTC Pepcid and/or Tums are often very helpful for as needed use.  [name] However, for persisting chronic or daily symptoms, stronger medications like Omeprazole may be needed. . You may need to avoid foods and drinks such as: ? Coffee and tea (with or without caffeine). ? Drinks that contain alcohol. ? Energy drinks and sports drinks. ? Bubbly (carbonated) drinks or sodas. ? Chocolate and cocoa. ? Peppermint and mint flavorings. ? Garlic and onions. ? Horseradish. ? Spicy and acidic foods. These include peppers, chili powder, curry powder, vinegar, hot sauces, and BBQ sauce. ? Citrus fruit juices and citrus fruits, such as oranges, lemons, and limes. ? Tomato-based foods. These include red sauce, chili, salsa, and pizza with red sauce. ? Fried and fatty foods. These include donuts, french fries,  potato chips, and high-fat dressings. ? High-fat meats. These include hot dogs, rib eye steak, sausage, ham, and bacon.  5. History of gastric bypass 1. Small, frequent meals 2. Increase protein 3. Check labs today.  - Vitamin B12 - CBC with Differential/Platelet - VITAMIN D 25 Hydroxy (Vit-D Deficiency, Fractures)  6. OSA (obstructive sleep apnea) Intensive lifestyle modifications are the first line treatment for this issue. We discussed several lifestyle modifications today and she will continue to work on diet, exercise and weight loss efforts. We will continue to monitor. Orders and follow up as documented in patient record.  -Continue wearing CPAP nightly.  7. Elevated glucose Fasting labs will be obtained and results with be discussed with Courtney Sprague in 2 weeks at her follow up visit. In the meanwhile Kiriana was started on a lower simple carbohydrate diet and will work on weight loss efforts.  - Hemoglobin A1c - Insulin, random  8. Depression screen Mahlet had a positive depression screening. Depression is commonly associated with obesity and often results in emotional eating behaviors. We will monitor this closely and work on CBT to help improve the non-hunger eating patterns. Referral to Psychology may be required if no improvement is seen as she continues in our clinic.  9. Class 2 severe obesity with serious comorbidity and body mass index (BMI) of 35.0 to 35.9 in  adult, unspecified obesity type (HCC) Jannie is currently in the action stage of change and her goal is to continue with weight loss efforts. I recommend Eutha begin the structured treatment plan as follows:  She has agreed to the Category 1 Plan.  Meal plan Intentional eating Will not keep unhealthy snacks in the home.   Exercise goals: Continue doing Zoom workouts.   Behavioral modification strategies: increasing lean protein intake, decreasing simple carbohydrates, increasing vegetables, increasing water  intake, decreasing eating out, no skipping meals, meal planning and cooking strategies, keeping healthy foods in the home and planning for success.  She was informed of the importance of frequent follow-up visits to maximize her success with intensive lifestyle modifications for her multiple health conditions. She was informed we would discuss her lab results at her next visit unless there is a critical issue that needs to be addressed sooner. Anahit agreed to keep her next visit at the agreed upon time to discuss these results.  Objective:   Blood pressure 131/84, pulse 76, temperature 98.5 F (36.9 C), height 5\' 2"  (1.575 m), weight 191 lb (86.6 kg), SpO2 99 %. Body mass index is 34.93 kg/m.  EKG: Normal sinus rhythm, rate 71.  Indirect Calorimeter completed today shows a VO2 of 157 and a REE of 1095.  Her calculated basal metabolic rate is thus her basal metabolic rate is worse than expected.  [name]: Cooperative, alert, well developed, in no acute distress. HEENT: Conjunctivae and lids unremarkable. Cardiovascular: Regular rhythm.  Lungs: Normal work of breathing. Neurologic: No focal deficits.   Lab Results  Component Value Date   CREATININE 0.66 02/20/2021   BUN 11 02/20/2021   NA 145 (H) 02/20/2021   K 4.4 02/20/2021   CL 103 02/20/2021   CO2 21 02/20/2021   Lab Results  Component Value Date   ALT 17 02/20/2021   AST 21 02/20/2021   ALKPHOS 93 02/20/2021   BILITOT 0.6 02/20/2021   Lab Results  Component Value Date   HGBA1C 5.5 02/20/2021   HGBA1C 5.4 08/02/2019   HGBA1C 5.3 02/18/2018   HGBA1C 5.5 06/11/2016   HGBA1C 5.5 04/20/2013   Lab Results  Component Value Date   INSULIN 3.5 02/20/2021   INSULIN 3.0 02/18/2018   Lab Results  Component Value Date   TSH 1.660 02/20/2021   Lab Results  Component Value Date   CHOL 161 02/20/2021   HDL 86 02/20/2021   LDLCALC 65 02/20/2021   TRIG 46 02/20/2021   CHOLHDL 2 08/22/2020   Lab Results  Component  Value Date   WBC 4.2 02/20/2021   HGB 13.0 02/20/2021   HCT 39.4 02/20/2021   MCV 93 02/20/2021   PLT 309 02/20/2021   Lab Results  Component Value Date   IRON 120 06/27/2010   FERRITIN 42.7 06/27/2010   Obesity Behavioral Intervention:   Approximately 15 minutes were spent on the discussion below.  ASK: We discussed the diagnosis of obesity with 06/29/2010 today and Kasaundra agreed to give Courtney Sprague permission to discuss obesity behavioral modification therapy today.  ASSESS: Kimiah has the diagnosis of obesity and her BMI today is 35.0. Brittnay is in the action stage of change.   ADVISE: Cenia was educated on the multiple health risks of obesity as well as the benefit of weight loss to improve her health. She was advised of the need for long term treatment and the importance of lifestyle modifications to improve her current health and to decrease her risk of future  health problems.  AGREE: Multiple dietary modification options and treatment options were discussed and Kearstin agreed to follow the recommendations documented in the above note.  ARRANGE: Salle was educated on the importance of frequent visits to treat obesity as outlined per CMS and USPSTF guidelines and agreed to schedule her next follow up appointment today.  Attestation Statements:   Reviewed by clinician on day of visit: allergies, medications, problem list, medical history, surgical history, family history, social history, and previous encounter notes.  Edmund Hilda, CMA, am acting as Energy manager for [employer], DO.  I have reviewed the above documentation for accuracy and completeness, and I agree with the above. Corinna Capra, DO

## 2021-02-22 ENCOUNTER — Encounter (INDEPENDENT_AMBULATORY_CARE_PROVIDER_SITE_OTHER): Payer: Self-pay | Admitting: Bariatrics

## 2021-03-06 ENCOUNTER — Encounter (INDEPENDENT_AMBULATORY_CARE_PROVIDER_SITE_OTHER): Payer: Self-pay | Admitting: Bariatrics

## 2021-03-06 ENCOUNTER — Other Ambulatory Visit: Payer: Self-pay

## 2021-03-06 ENCOUNTER — Ambulatory Visit (INDEPENDENT_AMBULATORY_CARE_PROVIDER_SITE_OTHER): Payer: Medicare Other | Admitting: Bariatrics

## 2021-03-06 VITALS — BP 124/85 | HR 78 | Temp 98.7°F | Ht 62.0 in | Wt 189.0 lb

## 2021-03-06 DIAGNOSIS — E669 Obesity, unspecified: Secondary | ICD-10-CM | POA: Diagnosis not present

## 2021-03-06 DIAGNOSIS — Z6834 Body mass index (BMI) 34.0-34.9, adult: Secondary | ICD-10-CM

## 2021-03-06 DIAGNOSIS — E559 Vitamin D deficiency, unspecified: Secondary | ICD-10-CM | POA: Diagnosis not present

## 2021-03-06 DIAGNOSIS — I10 Essential (primary) hypertension: Secondary | ICD-10-CM

## 2021-03-06 MED ORDER — VITAMIN D (ERGOCALCIFEROL) 1.25 MG (50000 UNIT) PO CAPS
50000.0000 [IU] | ORAL_CAPSULE | ORAL | 0 refills | Status: DC
Start: 2021-03-06 — End: 2021-03-26

## 2021-03-08 DIAGNOSIS — G4733 Obstructive sleep apnea (adult) (pediatric): Secondary | ICD-10-CM | POA: Diagnosis not present

## 2021-03-13 ENCOUNTER — Encounter (INDEPENDENT_AMBULATORY_CARE_PROVIDER_SITE_OTHER): Payer: Self-pay | Admitting: Bariatrics

## 2021-03-13 NOTE — Progress Notes (Signed)
Chief Complaint:   OBESITY [name] is here to discuss her progress with her obesity treatment plan along with follow-up of her obesity related diagnoses. [name] is on the Category 1 Plan and states she is following her eating plan approximately 75% of the time. [name] states she is doing weights and cardio for 45 minutes 2 times per week.  Today's visit was #: 2 Starting weight: 191 lbs Starting date: 02/20/2021 Today's weight: 189 lbs Today's date: 03/06/2021 Total lbs lost to date: 2 Total lbs lost since last in-office visit: 2  Interim History: [name] is down 2 lbs since her last visit. She got a little bored and was doing some tracking, and increased 50-100 calories. She is doing well with her water.  Subjective:   1. Vitamin D insufficiency [name] is taking Vit D OTC, and her recent Vit D level is 31.0.  2. Essential hypertension [name]'s blood pressure is controlled.  Assessment/Plan:   1. Vitamin D insufficiency Low Vitamin D level contributes to fatigue and are associated with obesity, breast, and colon cancer. [name] agreed to start prescription Vitamin D 50,000 IU every week with no refills. She will follow-up for routine testing of Vitamin D, at least 2-3 times per year to avoid over-replacement.  - Vitamin D, Ergocalciferol, (DRISDOL) 1.25 MG (50000 UNIT) CAPS capsule; Take 1 capsule (50,000 Units total) by mouth every 7 (seven) days.  Dispense: 4 capsule; Refill: 0  2. Essential hypertension [name] will continue her medications and working on healthy weight loss, and exercise to improve blood pressure control. We will watch for signs of hypotension as she continues her lifestyle modifications.  3. Obesity with current BMI of 34.6 [name] is currently in the action stage of change. As such, her goal is to continue with weight loss efforts. She has agreed to the Category 1 Plan or keeping a food journal and adhering to recommended goals of 1,000-11,00 calories  and 70-80 grams of protein daily.   Intentional eating was discussed. I reviewed labs from 02/20/2021 with the patient today.  Exercise goals: As is.  Behavioral modification strategies: increasing lean protein intake, decreasing simple carbohydrates, increasing vegetables, increasing water intake, decreasing eating out, no skipping meals, meal planning and cooking strategies, keeping healthy foods in the home, avoiding temptations, and planning for success.  [name] has agreed to follow-up with our clinic in 2 weeks. She was informed of the importance of frequent follow-up visits to maximize her success with intensive lifestyle modifications for her multiple health conditions.   Objective:   Blood pressure 124/85, pulse 78, temperature 98.7 F (37.1 C), height 5\' 2"  (1.575 m), weight 189 lb (85.7 kg), SpO2 100 %. Body mass index is 34.57 kg/m.  General: Cooperative, alert, well developed, in no acute distress. HEENT: Conjunctivae and lids unremarkable. Cardiovascular: Regular rhythm.  Lungs: Normal work of breathing. Neurologic: No focal deficits.   Lab Results  Component Value Date   CREATININE 0.66 02/20/2021   BUN 11 02/20/2021   NA 145 (H) 02/20/2021   K 4.4 02/20/2021   CL 103 02/20/2021   CO2 21 02/20/2021   Lab Results  Component Value Date   ALT 17 02/20/2021   AST 21 02/20/2021   ALKPHOS 93 02/20/2021   BILITOT 0.6 02/20/2021   Lab Results  Component Value Date   HGBA1C 5.5 02/20/2021   HGBA1C 5.4 08/02/2019   HGBA1C 5.3 02/18/2018   HGBA1C 5.5 06/11/2016   HGBA1C 5.5 04/20/2013   Lab Results  Component  Value Date   INSULIN 3.5 02/20/2021   INSULIN 3.0 02/18/2018   Lab Results  Component Value Date   TSH 1.660 02/20/2021   Lab Results  Component Value Date   CHOL 161 02/20/2021   HDL 86 02/20/2021   LDLCALC 65 02/20/2021   TRIG 46 02/20/2021   CHOLHDL 2 08/22/2020   Lab Results  Component Value Date   WBC 4.2 02/20/2021   HGB 13.0 02/20/2021    HCT 39.4 02/20/2021   MCV 93 02/20/2021   PLT 309 02/20/2021   Lab Results  Component Value Date   IRON 120 06/27/2010   FERRITIN 42.7 06/27/2010    Obesity Behavioral Intervention:   Approximately 15 minutes were spent on the discussion below.  ASK: We discussed the diagnosis of obesity with [name] today and [name] agreed to give [place] permission to discuss obesity behavioral modification therapy today.  ASSESS: [name] has the diagnosis of obesity and her BMI today is 34.56. [name] is in the action stage of change.   ADVISE: [name] was educated on the multiple health risks of obesity as well as the benefit of weight loss to improve her health. She was advised of the need for long term treatment and the importance of lifestyle modifications to improve her current health and to decrease her risk of future health problems.  AGREE: Multiple dietary modification options and treatment options were discussed and [name] agreed to follow the recommendations documented in the above note.  ARRANGE: [name] was educated on the importance of frequent visits to treat obesity as outlined per CMS and USPSTF guidelines and agreed to schedule her next follow up appointment today.  Attestation Statements:   Reviewed by clinician on day of visit: allergies, medications, problem list, medical history, surgical history, family history, social history, and previous encounter notes.   Trude Mcburney, am acting as Energy manager for [employer], DO.  I have reviewed the above documentation for accuracy and completeness, and I agree with the above. Corinna Capra, DO

## 2021-03-26 ENCOUNTER — Other Ambulatory Visit: Payer: Self-pay

## 2021-03-26 ENCOUNTER — Encounter (INDEPENDENT_AMBULATORY_CARE_PROVIDER_SITE_OTHER): Payer: Self-pay | Admitting: Bariatrics

## 2021-03-26 ENCOUNTER — Ambulatory Visit (INDEPENDENT_AMBULATORY_CARE_PROVIDER_SITE_OTHER): Payer: Medicare Other | Admitting: Bariatrics

## 2021-03-26 VITALS — BP 130/82 | HR 76 | Temp 98.6°F | Ht 62.0 in | Wt 187.0 lb

## 2021-03-26 DIAGNOSIS — Z6834 Body mass index (BMI) 34.0-34.9, adult: Secondary | ICD-10-CM | POA: Diagnosis not present

## 2021-03-26 DIAGNOSIS — E559 Vitamin D deficiency, unspecified: Secondary | ICD-10-CM | POA: Diagnosis not present

## 2021-03-26 DIAGNOSIS — E669 Obesity, unspecified: Secondary | ICD-10-CM | POA: Diagnosis not present

## 2021-03-26 DIAGNOSIS — I10 Essential (primary) hypertension: Secondary | ICD-10-CM

## 2021-03-26 DIAGNOSIS — Z6836 Body mass index (BMI) 36.0-36.9, adult: Secondary | ICD-10-CM

## 2021-03-26 MED ORDER — VITAMIN D (ERGOCALCIFEROL) 1.25 MG (50000 UNIT) PO CAPS: 50000.0000 [IU] | ORAL_CAPSULE | ORAL | 0 refills | Status: DC

## 2021-03-29 ENCOUNTER — Other Ambulatory Visit: Payer: Self-pay

## 2021-03-29 ENCOUNTER — Telehealth: Payer: Self-pay | Admitting: Family Medicine

## 2021-03-29 DIAGNOSIS — Z8639 Personal history of other endocrine, nutritional and metabolic disease: Secondary | ICD-10-CM

## 2021-03-29 NOTE — Telephone Encounter (Signed)
Patient states she would like to see a new endocrinologist for her thyroid nodules. Please refer her to Dr. Marlene Lard [address], [place], [place] [address]

## 2021-03-29 NOTE — Telephone Encounter (Signed)
Referral has been placed. 

## 2021-03-29 NOTE — Telephone Encounter (Signed)
Looks like pt had imaging in 2020 regarding nodules and is not thyroid medication. Please advise

## 2021-03-30 NOTE — Progress Notes (Signed)
Chief Complaint:   OBESITY [name] is here to discuss her progress with her obesity treatment plan along with follow-up of her obesity related diagnoses. [name] is on the Category 1 Plan and states she is following her eating plan approximately 85% of the time. [name] states she is doing cardio and weights 45 minutes 2 times per week.  Today's visit was #: 3 Starting weight: 191 lbs Starting date: 02/20/2021 Today's weight: 187 lbs Today's date: 03/26/2021 Total lbs lost to date: 4 Total lbs lost since last in-office visit: 2  Interim History: [name] is down an additional 2 lbs and staying within 1100 calories. She is getting adequate water.  Subjective:   1. Essential hypertension Review: taking medications as instructed, no medication side effects noted, no chest pain on exertion, no dyspnea on exertion, no swelling of ankles. [name]'s blood pressure is controlled.  BP Readings from Last 3 Encounters:  03/26/21 130/82  03/06/21 124/85  02/20/21 131/84   2. Vitamin D insufficiency She is currently taking prescription vitamin D 50,000 IU each week as directed. She denies nausea, vomiting or muscle weakness.  Lab Results  Component Value Date   VD25OH 31.0 02/20/2021   VD25OH 43.61 08/22/2020   VD25OH 39.9 02/29/2020   Assessment/Plan:   1. Essential hypertension [name] is working on healthy weight loss and exercise to improve blood pressure control. We will watch for signs of hypotension as she continues her lifestyle modifications. She will continue her medication.  2. Vitamin D insufficiency Low Vitamin D level contributes to fatigue and are associated with obesity, breast, and colon cancer. She agrees to continue to take prescription Vitamin D @50 ,000 IU every week and will follow-up for routine testing of Vitamin D, at least 2-3 times per year to avoid over-replacement.  - Vitamin D, Ergocalciferol, (DRISDOL) 1.25 MG (50000 UNIT) CAPS capsule; Take 1 capsule  (50,000 Units total) by mouth every 7 (seven) days.  Dispense: 4 capsule; Refill: 0  3. Obesity with current BMI of 34.3 [name] is currently in the action stage of change. As such, her goal is to continue with weight loss efforts. She has agreed to the Category 1 Plan.   Meal planning Intentional eating Increase protein to 70-80 grams Hand out of protein equivalent provided  Exercise goals: As is  Behavioral modification strategies: increasing lean protein intake, decreasing simple carbohydrates, increasing vegetables, increasing water intake, decreasing eating out, no skipping meals, meal planning and cooking strategies, keeping healthy foods in the home, and planning for success.  [name] has agreed to follow-up with our clinic in 2 weeks. She was informed of the importance of frequent follow-up visits to maximize her success with intensive lifestyle modifications for her multiple health conditions.   Objective:   Blood pressure 130/82, pulse 76, temperature 98.6 F (37 C), height 5\' 2"  (1.575 m), weight 187 lb (84.8 kg), SpO2 98 %. Body mass index is 34.2 kg/m.  General: Cooperative, alert, well developed, in no acute distress. HEENT: Conjunctivae and lids unremarkable. Cardiovascular: Regular rhythm.  Lungs: Normal work of breathing. Neurologic: No focal deficits.   Lab Results  Component Value Date   CREATININE 0.66 02/20/2021   BUN 11 02/20/2021   NA 145 (H) 02/20/2021   K 4.4 02/20/2021   CL 103 02/20/2021   CO2 21 02/20/2021   Lab Results  Component Value Date   ALT 17 02/20/2021   AST 21 02/20/2021   ALKPHOS 93 02/20/2021   BILITOT 0.6 02/20/2021   Lab  Results  Component Value Date   HGBA1C 5.5 02/20/2021   HGBA1C 5.4 08/02/2019   HGBA1C 5.3 02/18/2018   HGBA1C 5.5 06/11/2016   HGBA1C 5.5 04/20/2013   Lab Results  Component Value Date   INSULIN 3.5 02/20/2021   INSULIN 3.0 02/18/2018   Lab Results  Component Value Date   TSH 1.660 02/20/2021    Lab Results  Component Value Date   CHOL 161 02/20/2021   HDL 86 02/20/2021   LDLCALC 65 02/20/2021   TRIG 46 02/20/2021   CHOLHDL 2 08/22/2020   Lab Results  Component Value Date   VD25OH 31.0 02/20/2021   VD25OH 43.61 08/22/2020   VD25OH 39.9 02/29/2020   Lab Results  Component Value Date   WBC 4.2 02/20/2021   HGB 13.0 02/20/2021   HCT 39.4 02/20/2021   MCV 93 02/20/2021   PLT 309 02/20/2021   Lab Results  Component Value Date   IRON 120 06/27/2010   FERRITIN 42.7 06/27/2010    Obesity Behavioral Intervention:   Approximately 15 minutes were spent on the discussion below.  ASK: We discussed the diagnosis of obesity with [name] today and [name] agreed to give [place] permission to discuss obesity behavioral modification therapy today.  ASSESS: [name] has the diagnosis of obesity and her BMI today is 34.3. [name] is in the action stage of change.   ADVISE: [name] was educated on the multiple health risks of obesity as well as the benefit of weight loss to improve her health. She was advised of the need for long term treatment and the importance of lifestyle modifications to improve her current health and to decrease her risk of future health problems.  AGREE: Multiple dietary modification options and treatment options were discussed and [name] agreed to follow the recommendations documented in the above note.  ARRANGE: [name] was educated on the importance of frequent visits to treat obesity as outlined per CMS and USPSTF guidelines and agreed to schedule her next follow up appointment today.  Attestation Statements:   Reviewed by clinician on day of visit: allergies, medications, problem list, medical history, surgical history, family history, social history, and previous encounter notes.  [name], CMA, am acting as Energy manager for [employer], DO.  I have reviewed the above documentation for accuracy and completeness, and I agree with the  above. Corinna Capra, DO

## 2021-04-12 ENCOUNTER — Other Ambulatory Visit: Payer: Self-pay

## 2021-04-12 ENCOUNTER — Encounter (INDEPENDENT_AMBULATORY_CARE_PROVIDER_SITE_OTHER): Payer: Self-pay | Admitting: Bariatrics

## 2021-04-12 ENCOUNTER — Ambulatory Visit (INDEPENDENT_AMBULATORY_CARE_PROVIDER_SITE_OTHER): Payer: Medicare Other | Admitting: Bariatrics

## 2021-04-12 VITALS — BP 145/89 | HR 65 | Temp 97.5°F | Ht 62.0 in | Wt 187.0 lb

## 2021-04-12 DIAGNOSIS — Z6836 Body mass index (BMI) 36.0-36.9, adult: Secondary | ICD-10-CM

## 2021-04-12 DIAGNOSIS — E559 Vitamin D deficiency, unspecified: Secondary | ICD-10-CM | POA: Diagnosis not present

## 2021-04-12 DIAGNOSIS — I10 Essential (primary) hypertension: Secondary | ICD-10-CM | POA: Diagnosis not present

## 2021-04-12 DIAGNOSIS — E669 Obesity, unspecified: Secondary | ICD-10-CM

## 2021-04-12 MED ORDER — VITAMIN D (ERGOCALCIFEROL) 1.25 MG (50000 UNIT) PO CAPS: 50000.0000 [IU] | ORAL_CAPSULE | ORAL | 0 refills | Status: DC

## 2021-04-12 MED ORDER — LOMAIRA 8 MG PO TABS: ORAL_TABLET | ORAL | 0 refills | Status: DC

## 2021-04-16 ENCOUNTER — Other Ambulatory Visit: Payer: Self-pay | Admitting: Family Medicine

## 2021-04-17 ENCOUNTER — Telehealth: Payer: Self-pay | Admitting: Gastroenterology

## 2021-04-17 DIAGNOSIS — M79642 Pain in left hand: Secondary | ICD-10-CM | POA: Diagnosis not present

## 2021-04-17 DIAGNOSIS — M654 Radial styloid tenosynovitis [de Quervain]: Secondary | ICD-10-CM | POA: Diagnosis not present

## 2021-04-17 MED ORDER — ESOMEPRAZOLE MAGNESIUM 40 MG PO CPDR: 40.0000 mg | DELAYED_RELEASE_CAPSULE | Freq: Every day | ORAL | 0 refills | Status: DC | PRN

## 2021-04-17 NOTE — Telephone Encounter (Signed)
Inbound call from patient requesting to have a script for Nexium if possible to be sent to the pharmacy.  Please advise.

## 2021-04-17 NOTE — Telephone Encounter (Signed)
Patient last seen 01-2021.  EGD in 08-2018. Dr. Adela Lank approved switch to Nexium 40 mg qd prn. Script sent

## 2021-04-18 NOTE — Progress Notes (Signed)
Chief Complaint:   OBESITY [name] is here to discuss her progress with her obesity treatment plan along with follow-up of her obesity related diagnoses. [name] is on the Category 1 Plan and states she is following her eating plan approximately 85% of the time. [name] states she is doing 0 minutes 0 times per week.  Today's visit was #: 4 Starting weight: 191 lbs Starting date: 02/20/2021 Today's weight: 187 lbs Today's date: 04/12/2021 Total lbs lost to date: 4 lbs Total lbs lost since last in-office visit: 0  Interim History: [name] weight remains the same since her last visit. She is getting more protein.  Subjective:   1. Vitamin D insufficiency [name] is taking Vitamin D and denies muscle weakness, nausea and vomiting.  2. Essential hypertension [name] is not on any medications. Her hypertension is reasonably well controlled.  Assessment/Plan:   1. Vitamin D insufficiency Low Vitamin D level contributes to fatigue and are associated with obesity, breast, and colon cancer. We will refill Vitamin D for 1 month with no refills and she agrees to continue to take prescription Vitamin D 50,000 IU every week and will follow-up for routine testing of Vitamin D, at least 2-3 times per year to avoid over-replacement.  - Vitamin D, Ergocalciferol, (DRISDOL) 1.25 MG (50000 UNIT) CAPS capsule; Take 1 capsule (50,000 Units total) by mouth every 7 (seven) days.  Dispense: 4 capsule; Refill: 0  2. Essential hypertension [name] will continue to work on plan and exercise.She is working on healthy weight loss and exercise to improve blood pressure control. We will watch for signs of hypotension as she continues her lifestyle modifications.   3. Obesity with current BMI of 34.3 [name] is currently in the action stage of change. As such, her goal is to continue with weight loss efforts. She has agreed to the Category 1 Plan.   [name] will continue meal planning. She will continue  intentional eating. [name] agreed to start Phentermine with no refills.  - Phentermine HCl (LOMAIRA) 8 MG TABS; Will take with her lunch daily  Dispense: 30 tablet; Refill: 0  Exercise goals:  Will go back to see the trainer.  Behavioral modification strategies: increasing lean protein intake, decreasing simple carbohydrates, increasing vegetables, increasing water intake, decreasing eating out, no skipping meals, meal planning and cooking strategies, keeping healthy foods in the home, and planning for success.  [name] has agreed to follow-up with our clinic in 2 weeks. She was informed of the importance of frequent follow-up visits to maximize her success with intensive lifestyle modifications for her multiple health conditions.   Objective:   Blood pressure (!) 145/89, pulse 65, temperature (!) 97.5 F (36.4 C), height 5\' 2"  (1.575 m), weight 187 lb (84.8 kg), SpO2 98 %. Body mass index is 34.2 kg/m.  General: Cooperative, alert, well developed, in no acute distress. HEENT: Conjunctivae and lids unremarkable. Cardiovascular: Regular rhythm.  Lungs: Normal work of breathing. Neurologic: No focal deficits.   Lab Results  Component Value Date   CREATININE 0.66 02/20/2021   BUN 11 02/20/2021   NA 145 (H) 02/20/2021   K 4.4 02/20/2021   CL 103 02/20/2021   CO2 21 02/20/2021   Lab Results  Component Value Date   ALT 17 02/20/2021   AST 21 02/20/2021   ALKPHOS 93 02/20/2021   BILITOT 0.6 02/20/2021   Lab Results  Component Value Date   HGBA1C 5.5 02/20/2021   HGBA1C 5.4 08/02/2019   HGBA1C 5.3 02/18/2018  HGBA1C 5.5 06/11/2016   HGBA1C 5.5 04/20/2013   Lab Results  Component Value Date   INSULIN 3.5 02/20/2021   INSULIN 3.0 02/18/2018   Lab Results  Component Value Date   TSH 1.660 02/20/2021   Lab Results  Component Value Date   CHOL 161 02/20/2021   HDL 86 02/20/2021   LDLCALC 65 02/20/2021   TRIG 46 02/20/2021   CHOLHDL 2 08/22/2020   Lab Results   Component Value Date   VD25OH 31.0 02/20/2021   VD25OH 43.61 08/22/2020   VD25OH 39.9 02/29/2020   Lab Results  Component Value Date   WBC 4.2 02/20/2021   HGB 13.0 02/20/2021   HCT 39.4 02/20/2021   MCV 93 02/20/2021   PLT 309 02/20/2021   Lab Results  Component Value Date   IRON 120 06/27/2010   FERRITIN 42.7 06/27/2010    Obesity Behavioral Intervention:   Approximately 15 minutes were spent on the discussion below.  ASK: We discussed the diagnosis of obesity with [name] today and [name] agreed to give [place] permission to discuss obesity behavioral modification therapy today.  ASSESS: [name] has the diagnosis of obesity and her BMI today is 34.3. [name] is in the action stage of change.   ADVISE: [name] was educated on the multiple health risks of obesity as well as the benefit of weight loss to improve her health. She was advised of the need for long term treatment and the importance of lifestyle modifications to improve her current health and to decrease her risk of future health problems.  AGREE: Multiple dietary modification options and treatment options were discussed and [name] agreed to follow the recommendations documented in the above note.  ARRANGE: [name] was educated on the importance of frequent visits to treat obesity as outlined per CMS and USPSTF guidelines and agreed to schedule her next follow up appointment today.  Attestation Statements:   Reviewed by clinician on day of visit: allergies, medications, problem list, medical history, surgical history, family history, social history, and previous encounter notes.  I, Jackson Latino, RMA, am acting as Energy manager for [employer], DO.   I have reviewed the above documentation for accuracy and completeness, and I agree with the above. Corinna Capra, DO

## 2021-04-19 ENCOUNTER — Encounter (INDEPENDENT_AMBULATORY_CARE_PROVIDER_SITE_OTHER): Payer: Self-pay | Admitting: Bariatrics

## 2021-04-24 ENCOUNTER — Ambulatory Visit: Payer: Medicare Other

## 2021-04-24 NOTE — Progress Notes (Deleted)
Subjective:   [name] is a 65 y.o. female who presents for Medicare Annual (Subsequent) preventive examination.  I connected with *** today by telephone and verified that I am speaking with the correct person using two identifiers. Location patient: home Location provider: work Persons participating in the virtual visit: patient, Engineer, civil (consulting).    I discussed the limitations, risks, security and privacy concerns of performing an evaluation and management service by telephone and the availability of in person appointments. I also discussed with the patient that there may be a patient responsible charge related to this service. The patient expressed understanding and verbally consented to this telephonic visit.    Interactive audio and video telecommunications were attempted between this provider and patient, however failed, due to patient having technical difficulties OR patient did not have access to video capability.  We continued and completed visit with audio only.  Some vital signs may be absent or patient reported.   Time Spent with patient on telephone encounter: *** minutes   Review of Systems    ***       Objective:    There were no vitals filed for this visit. There is no height or weight on file to calculate BMI.  Advanced Directives 04/06/2020 06/08/2019 02/09/2018 03/07/2016 03/05/2016  Does Patient Have a Medical Advance Directive? No No No No No  Would patient like information on creating a medical advance directive? - No - Patient declined Yes (MAU/Ambulatory/Procedural Areas - Information given) No - patient declined information No - patient declined information    Current Medications (verified) Outpatient Encounter Medications as of 04/24/2021  Medication Sig   Biotin w/ Vitamins C & E (HAIR SKIN & NAILS GUMMIES PO) Take 1 each by mouth daily.   Calcium Carb-Cholecalciferol (CALTRATE 600+D3 SOFT PO) Take by mouth.   esomeprazole (NEXIUM) 40 MG capsule Take 1 capsule  (40 mg total) by mouth daily as needed. Crack open capsule and ingest contents   Phentermine HCl (LOMAIRA) 8 MG TABS Will take with her lunch daily   predniSONE (DELTASONE) 5 MG tablet Take 5 mg by mouth as needed.   vitamin B-12 (CYANOCOBALAMIN) 1000 MCG tablet Take 1,000 mcg by mouth daily.   Vitamin D, Ergocalciferol, (DRISDOL) 1.25 MG (50000 UNIT) CAPS capsule Take 1 capsule (50,000 Units total) by mouth every 7 (seven) days.   No facility-administered encounter medications on file as of 04/24/2021.    Allergies (verified) Aspirin, Codeine, Morphine, and Sulfonamide derivatives   History: Past Medical History:  Diagnosis Date   Abdominal pain, other specified site 03/31/2008   Centricity Description: FLANK PAIN, RIGHT Qualifier: Diagnosis of  By: Alwyn Ren MD, William   Centricity Description: ABDOMINAL PAIN OTHER SPECIFIED SITE Qualifier: Diagnosis of  By: Janit Bern     Acute sprain or strain of cervical region 04/10/2012   ALLERGIC RHINITIS 06/26/2007   Qualifier: Diagnosis of  By: Charlsie Quest RMA, Lucy     ANKYLOSING SPONDYLITIS 03/31/2008   Qualifier: Diagnosis of  By: Alwyn Ren MD, Chrissie Noa     Ankylosing spondylitis (HCC)    neck and spin   Ankylosing spondylitis of multiple sites in spine [facility])    Arthritis    Back pain    Bariatric surgery status 01/14/2008   Qualifier: Diagnosis of  By: Janit Bern     Blood in stool 12/02/2008   Qualifier: Diagnosis of  By: Janit Bern     Blood transfusion without reported diagnosis    Calf pain 10/13/2012  CHEST PAIN UNSPECIFIED 10/11/2009   Qualifier: Diagnosis of  By: Arlyce Dice MD, Barbette Hair    CONSTIPATION 04/06/2010   Qualifier: Diagnosis of  By: Janit Bern     CORNEAL ABRASION, LEFT 01/22/2010   Qualifier: Diagnosis of  By: Janit Bern     Gastritis    GERD 06/26/2007   Qualifier: Diagnosis of  By: Charlsie Quest RMA, Lucy     GERD (gastroesophageal reflux disease)    Hiatal hernia    Hypertension    no medications now   IBS  06/26/2007   Qualifier: Diagnosis of  By: Charlsie Quest RMA, Lucy     Joint pain    Lactose intolerance    LEG CRAMPS, NOCTURNAL 11/14/2010   Qualifier: Diagnosis of  By: Floydene Flock     Long-term current use of steroids 04/20/2013   LYMPHADENOPATHY 06/10/2007   Qualifier: Diagnosis of  By: Janit Bern     MORBID OBESITY 06/26/2007   Qualifier: Diagnosis of  By: Samara Snide     MUSCLE PAIN 03/31/2008   Qualifier: Diagnosis of  By: Alwyn Ren MD, William     Noninfectious gastroenteritis and colitis 03/17/2013   Obesity (BMI 30-39.9) 03/16/2014   OSA (obstructive sleep apnea) 03/08/2016   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/23/2010   Qualifier: Diagnosis of  By: Janit Bern     Rheumatoid arthritis (HCC)    Seasonal allergies 05/14/2015   THYROID CYST 06/26/2007   Qualifier: Diagnosis of  By: Charlsie Quest RMA, Lucy     THYROID NODULE, HX OF 06/10/2007   Qualifier: Diagnosis of  By: Janit Bern     Vitamin D deficiency    Past Surgical History:  Procedure Laterality Date   BARIATRIC SURGERY     BREAST REDUCTION SURGERY  1996   COLONOSCOPY  10/24/2009   kaplan-normal exam   ESOPHAGOGASTRODUODENOSCOPY     pelvis replacement  2010   TOTAL HIP ARTHROPLASTY     x 2 left, one revision   UPPER GASTROINTESTINAL ENDOSCOPY     Family History  Problem Relation Age of Onset   Hypertension Mother    High Cholesterol Mother    Diabetes Mother    Arthritis Father    Hypertension Father    Diabetes Father    High Cholesterol Father    Melanoma Maternal Grandmother    Breast cancer Maternal Grandmother        and Aunt   Hypertension Sister    Diabetes Sister    Colon cancer Neg Hx    Esophageal cancer Neg Hx    Rectal cancer Neg Hx    Stomach cancer Neg Hx    Colon polyps Neg Hx    Social History   Socioeconomic History   Marital status: Married    Spouse name: Leilynn Pilat   Number of children: Not on file   Years of education: Not on file   Highest education level: Not  on file  Occupational History   Occupation: retired  Tobacco Use   Smoking status: Never   Smokeless tobacco: Never  Vaping Use   Vaping Use: Never used  Substance and Sexual Activity   Alcohol use: No    Alcohol/week: 0.0 standard drinks   Drug use: No   Sexual activity: Yes  Other Topics Concern   Not on file  Social History Narrative   Occupation: Sports coach   Daily Caffeine use- 1 cup daily      Social Determinants of Health  Financial Resource Strain: Not on file  Food Insecurity: Not on file  Transportation Needs: Not on file  Physical Activity: Not on file  Stress: Not on file  Social Connections: Not on file    Tobacco Counseling Counseling given: Not Answered   Clinical Intake:                 Diabetic?No         Activities of Daily Living No flowsheet data found.  Patient Care Team: Zola Button, Grayling Congress, DO as PCP - General Candice Camp, MD as Consulting Physician (Obstetrics and Gynecology) Cameron Proud, Onalee Hua, MD as Referring Physician (Surgery) Coralyn Helling, MD as Consulting Physician (Pulmonary Disease) Pollyann Savoy, MD as Consulting Physician (Rheumatology) Jake Bathe, MD as Consulting Physician (Cardiology) Armbruster, Willaim Rayas, MD as Consulting Physician (Gastroenterology) Carlus Pavlov, MD as Consulting Physician (Endocrinology) Phipps, C. Onalee Hua, MD (Otolaryngology) Durene Romans, MD as Consulting Physician (Orthopedic Surgery)  Indicate any recent Medical Services you may have received from other than [facility] providers in the past year (date may be approximate).     Assessment:   This is a routine wellness examination for [place].  Hearing/Vision screen No results found.  Dietary issues and exercise activities discussed:     Goals Addressed   None    Depression Screen PHQ 2/9 Scores 02/20/2021 02/29/2020 06/08/2019 02/18/2018 02/09/2018 06/11/2016 03/05/2016  PHQ - 2 Score 0 3 0 1 0 0 1  PHQ- 9 Score 6 11  - 8 - - -  Exception Documentation - - - Medical reason - - -    Fall Risk Fall Risk  06/08/2019 02/09/2018 03/05/2016  Falls in the past year? 1 Yes Yes  Comment - - Fell down steps and tripped over raised area on the road.   Number falls in past yr: 1 2 or more 2 or more  Injury with Fall? 0 No No  Risk for fall due to : (No Data) History of fall(s) Impaired balance/gait;History of fall(s)  Risk for fall due to: Comment slipped x2 - -  Follow up Education provided;Falls prevention discussed Education provided;Falls prevention discussed Education provided;Falls prevention discussed    FALL RISK PREVENTION PERTAINING TO THE HOME:  Any stairs in or around the home? {YES/NO:21197} If so, are there any without handrails? {YES/NO:21197} Home free of loose throw rugs in walkways, pet beds, electrical cords, etc? {YES/NO:21197} Adequate lighting in your home to reduce risk of falls? {YES/NO:21197}  ASSISTIVE DEVICES UTILIZED TO PREVENT FALLS:  Life alert? {YES/NO:21197} Use of a cane, walker or w/c? {YES/NO:21197} Grab bars in the bathroom? {YES/NO:21197} Shower chair or bench in shower? {YES/NO:21197} Elevated toilet seat or a handicapped toilet? {YES/NO:21197}  TIMED UP AND GO:  Was the test performed? {YES/NO:21197}.  Length of time to ambulate 10 feet: *** sec.   {Appearance of Gait:2101803}  Cognitive Function: MMSE - Mini Mental State Exam 03/05/2016  Orientation to time 5  Orientation to Place 5  Registration 3  Attention/ Calculation 5  Recall 3  Language- name 2 objects 2  Language- repeat 1  Language- follow 3 step command 3  Language- read & follow direction 1  Write a sentence 1  Copy design 1  Total score 30     6CIT Screen 06/08/2019  What Year? 0 points  What month? 0 points  What time? 0 points  Count back from 20 0 points  Months in reverse 0 points  Repeat phrase 4 points  Total Score 4  Immunizations Immunization History  Administered Date(s)  Administered   Influenza Whole 06/22/2010   PFIZER(Purple Top)SARS-COV-2 Vaccination 11/12/2019, 12/07/2019, 09/14/2020   Tdap 03/16/2014    TDAP status: Up to date  {Flu Vaccine status:2101806}  \Pneumococcal vaccine status: Not yet indicated  {Covid-19 vaccine status:2101808}  Qualifies for Shingles Vaccine? Yes   Zostavax completed No   Shingrix Completed?: No.    Education has been provided regarding the importance of this vaccine. Patient has been advised to call insurance company to determine out of pocket expense if they have not yet received this vaccine. Advised may also receive vaccine at local pharmacy or Health Dept. Verbalized acceptance and understanding.  Screening Tests Health Maintenance  Topic Date Due   Zoster Vaccines- Shingrix (1 of 2) Never done   MAMMOGRAM  09/30/2020   COVID-19 Vaccine (4 - Booster for Pfizer series) 01/13/2021   INFLUENZA VACCINE  04/30/2021   PAP SMEAR-Modifier  09/30/2021   TETANUS/TDAP  03/16/2024   COLONOSCOPY (Pts 45-4163yrs Insurance coverage will need to be confirmed)  04/13/2030   Hepatitis C Screening  Completed   HIV Screening  Completed   Pneumococcal Vaccine 380-63 Years old  Aged Out   HPV VACCINES  Aged Out    Health Maintenance  Health Maintenance Due  Topic Date Due   Zoster Vaccines- Shingrix (1 of 2) Never done   MAMMOGRAM  09/30/2020   COVID-19 Vaccine (4 - Booster for Pfizer series) 01/13/2021    Colorectal cancer screening: Type of screening: Colonoscopy. Completed 04/13/2020. Repeat every 10 years  {Mammogram status:21018020}  Bone Density status: Not yet indicated  Lung Cancer Screening: (Low Dose CT Chest recommended if Age 28-80 years, 30 pack-year currently smoking OR have quit w/in 15years.) does not qualify.     Additional Screening:  Hepatitis C Screening: Completed 11/17/2017  Vision Screening: Recommended annual ophthalmology exams for early detection of glaucoma and other disorders of the  eye. Is the patient up to date with their annual eye exam?  {YES/NO:21197} Who is the provider or what is the name of the office in which the patient attends annual eye exams? *** If pt is not established with a provider, would they like to be referred to a provider to establish care? {YES/NO:21197}.   Dental Screening: Recommended annual dental exams for proper oral hygiene  Community Resource Referral / Chronic Care Management: CRR required this visit?  {YES/NO:21197}  CCM required this visit?  {YES/NO:21197}     Plan:     I have personally reviewed and noted the following in the patient's chart:   Medical and social history Use of alcohol, tobacco or illicit drugs  Current medications and supplements including opioid prescriptions.  Functional ability and status Nutritional status Physical activity Advanced directives List of other physicians Hospitalizations, surgeries, and ER visits in previous 12 months Vitals Screenings to include cognitive, depression, and falls Referrals and appointments  In addition, I have reviewed and discussed with patient certain preventive protocols, quality metrics, and best practice recommendations. A written personalized care plan for preventive services as well as general preventive health recommendations were provided to patient.   Due to this being a telephonic visit, the after visit summary with patients personalized plan was offered to patient via mail or my-chart. ***Patient declined at this time./ Patient would like to access on my-chart/ per request, patient was mailed a copy of AVS./ Patient preferred to pick up at office at next visit.   Roanna RaiderMartha A Betsi Crespi, LPN   4/09/81197/26/2022  Nurse health Advisor  Nurse Notes: ***

## 2021-04-25 DIAGNOSIS — Z20822 Contact with and (suspected) exposure to covid-19: Secondary | ICD-10-CM | POA: Diagnosis not present

## 2021-04-26 ENCOUNTER — Telehealth (INDEPENDENT_AMBULATORY_CARE_PROVIDER_SITE_OTHER): Payer: Medicare Other | Admitting: Family Medicine

## 2021-04-26 ENCOUNTER — Encounter: Payer: Self-pay | Admitting: Family Medicine

## 2021-04-26 ENCOUNTER — Other Ambulatory Visit: Payer: Self-pay

## 2021-04-26 VITALS — Temp 102.1°F

## 2021-04-26 DIAGNOSIS — U071 COVID-19: Secondary | ICD-10-CM | POA: Diagnosis not present

## 2021-04-26 MED ORDER — NIRMATRELVIR/RITONAVIR (PAXLOVID)TABLET: 3.0000 | ORAL_TABLET | Freq: Two times a day (BID) | ORAL | 0 refills | Status: AC

## 2021-04-26 NOTE — Progress Notes (Signed)
MyChart Video Visit    Virtual Visit via Video Note   This visit type was conducted due to national recommendations for restrictions regarding the COVID-19 Pandemic (e.g. social distancing) in an effort to limit this patient's exposure and mitigate transmission in our community. This patient is at least at moderate risk for complications without adequate follow up. This format is felt to be most appropriate for this patient at this time. Physical exam was limited by quality of the video and audio technology used for the visit. [name] was able to get the patient set up on a video visit.  Patient location: Home Patient and provider in visit Provider location: Office  I discussed the limitations of evaluation and management by telemedicine and the availability of in person appointments. The patient expressed understanding and agreed to proceed.  Visit Date: 04/26/2021  Today's healthcare provider: Ann Held, DO     Subjective:    Patient ID: [name], female    DOB: 04/06/1956, 65 y.o.   MRN: [id number]  Chief Complaint  Patient presents with   Covid Positive    HPI Patient is in today for a video visit. Pt is home with c/o + covid test x 2 yesterday   symptoms started Tuesday-- cough, dry ,  head and chest congestion,  fever 102 yesterday ,  98.8 this am.   Pt is taking tylenol,  mucinex and motrin for body aches   She also has tessalon perles left over from last ov   Past Medical History:  Diagnosis Date   Abdominal pain, other specified site 03/31/2008   Centricity Description: FLANK PAIN, RIGHT Qualifier: Diagnosis of  By: Linna Darner MD, William   Centricity Description: ABDOMINAL PAIN OTHER SPECIFIED SITE Qualifier: Diagnosis of  By: Jerold Coombe     Acute sprain or strain of cervical region 04/10/2012   ALLERGIC RHINITIS 06/26/2007   Qualifier: Diagnosis of  By: Marca Ancona RMA, [place]     ANKYLOSING SPONDYLITIS 03/31/2008   Qualifier: Diagnosis of  By:  Linna Darner MD, William     Ankylosing spondylitis ([place])    neck and spin   Ankylosing spondylitis of multiple sites in spine [facility])    Arthritis    Back pain    Bariatric surgery status 01/14/2008   Qualifier: Diagnosis of  By: Etter Sjogren DO, Kendrick Fries     Blood in stool 12/02/2008   Qualifier: Diagnosis of  By: Jerold Coombe     Blood transfusion without reported diagnosis    Calf pain 10/13/2012   CHEST PAIN UNSPECIFIED 10/11/2009   Qualifier: Diagnosis of  By: Deatra Ina MD, Sandy Salaam    CONSTIPATION 04/06/2010   Qualifier: Diagnosis of  By: Jerold Coombe     CORNEAL ABRASION, LEFT 01/22/2010   Qualifier: Diagnosis of  By: Jerold Coombe     Gastritis    GERD 06/26/2007   Qualifier: Diagnosis of  By: Marca Ancona RMA, [place]     GERD (gastroesophageal reflux disease)    Hiatal hernia    Hypertension    no medications now   IBS 06/26/2007   Qualifier: Diagnosis of  By: Marca Ancona RMA, Lucy     Joint pain    Lactose intolerance    LEG CRAMPS, NOCTURNAL 11/14/2010   Qualifier: Diagnosis of  By: Heath Lark     Long-term current use of steroids 04/20/2013   LYMPHADENOPATHY 06/10/2007   Qualifier: Diagnosis of  By: Mechele Claude  OBESITY 06/26/2007   Qualifier: Diagnosis of  By: Larose Kells     MUSCLE PAIN 03/31/2008   Qualifier: Diagnosis of  By: Linna Darner MD, William     Noninfectious gastroenteritis and colitis 03/17/2013   Obesity (BMI 30-39.9) 03/16/2014   OSA (obstructive sleep apnea) 03/08/2016   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/23/2010   Qualifier: Diagnosis of  By: Jerold Coombe     Rheumatoid arthritis ([place])    Seasonal allergies 05/14/2015   THYROID CYST 06/26/2007   Qualifier: Diagnosis of  By: Marca Ancona RMA, Lucy     THYROID NODULE, HX OF 06/10/2007   Qualifier: Diagnosis of  By: Jerold Coombe     Vitamin D deficiency     Past Surgical History:  Procedure Laterality Date   BARIATRIC SURGERY     BREAST REDUCTION SURGERY  1996   COLONOSCOPY  10/24/2009    kaplan-normal exam   ESOPHAGOGASTRODUODENOSCOPY     pelvis replacement  2010   TOTAL HIP ARTHROPLASTY     x 2 left, one revision   UPPER GASTROINTESTINAL ENDOSCOPY      Family History  Problem Relation Age of Onset   Hypertension Mother    High Cholesterol Mother    Diabetes Mother    Arthritis Father    Hypertension Father    Diabetes Father    High Cholesterol Father    Melanoma Maternal Grandmother    Breast cancer Maternal Grandmother        and Aunt   Hypertension Sister    Diabetes Sister    Colon cancer Neg Hx    Esophageal cancer Neg Hx    Rectal cancer Neg Hx    Stomach cancer Neg Hx    Colon polyps Neg Hx     Social History   Socioeconomic History   Marital status: Married    Spouse name: Sundee Garland   Number of children: Not on file   Years of education: Not on file   Highest education level: Not on file  Occupational History   Occupation: retired  Tobacco Use   Smoking status: Never   Smokeless tobacco: Never  Vaping Use   Vaping Use: Never used  Substance and Sexual Activity   Alcohol use: No    Alcohol/week: 0.0 standard drinks   Drug use: No   Sexual activity: Yes  Other Topics Concern   Not on file  Social History Narrative   Occupation: Production manager   Daily Caffeine use- 1 cup daily      Social Determinants of Health   Financial Resource Strain: Not on file  Food Insecurity: Not on file  Transportation Needs: Not on file  Physical Activity: Not on file  Stress: Not on file  Social Connections: Not on file  Intimate Partner Violence: Not on file    Outpatient Medications Prior to Visit  Medication Sig Dispense Refill   Biotin w/ Vitamins C & E (HAIR SKIN & NAILS GUMMIES PO) Take 1 each by mouth daily.     Calcium Carb-Cholecalciferol (CALTRATE 600+D3 SOFT PO) Take by mouth.     esomeprazole (NEXIUM) 40 MG capsule Take 1 capsule (40 mg total) by mouth daily as needed. Crack open capsule and ingest contents 90 capsule 0    Phentermine HCl (LOMAIRA) 8 MG TABS Will take with her lunch daily 30 tablet 0   predniSONE (DELTASONE) 5 MG tablet Take 5 mg by mouth as needed.     vitamin B-12 (CYANOCOBALAMIN) 1000  MCG tablet Take 1,000 mcg by mouth daily.     Vitamin D, Ergocalciferol, (DRISDOL) 1.25 MG (50000 UNIT) CAPS capsule Take 1 capsule (50,000 Units total) by mouth every 7 (seven) days. 4 capsule 0   No facility-administered medications prior to visit.    Allergies  Allergen Reactions   Aspirin     REACTION: intolerance-gi upset   Codeine Itching   Morphine Itching   Sulfonamide Derivatives     Unsure of reaction    Review of Systems  Constitutional:  Negative for chills, fever and malaise/fatigue.  HENT:  Negative for congestion and hearing loss.   Eyes:  Negative for discharge.  Respiratory:  Negative for cough, sputum production and shortness of breath.   Cardiovascular:  Negative for chest pain, palpitations and leg swelling.  Gastrointestinal:  Negative for abdominal pain, blood in stool, constipation, diarrhea, heartburn, nausea and vomiting.  Genitourinary:  Negative for dysuria, frequency, hematuria and urgency.  Musculoskeletal:  Negative for back pain, falls and myalgias.  Skin:  Negative for rash.  Neurological:  Negative for dizziness, sensory change, loss of consciousness, weakness and headaches.  Endo/Heme/Allergies:  Negative for environmental allergies. Does not bruise/bleed easily.  Psychiatric/Behavioral:  Negative for depression and suicidal ideas. The patient is not nervous/anxious and does not have insomnia.       Objective:    Physical Exam Vitals and nursing note reviewed.  Constitutional:      Appearance: She is well-developed.  Neck:     Thyroid: No thyromegaly.     Vascular: No JVD.  Pulmonary:     Effort: Pulmonary effort is normal.  Neurological:     Mental Status: She is alert and oriented to person, place, and time.  Psychiatric:        Mood and Affect: Mood  normal.        Behavior: Behavior normal.        Thought Content: Thought content normal.        Judgment: Judgment normal.    Temp (!) 102.1 F (38.9 C) Comment: 98,8 this am after tylenol Wt Readings from Last 3 Encounters:  04/12/21 187 lb (84.8 kg)  03/26/21 187 lb (84.8 kg)  03/06/21 189 lb (85.7 kg)    Diabetic Foot Exam - Simple   No data filed    Lab Results  Component Value Date   WBC 4.2 02/20/2021   HGB 13.0 02/20/2021   HCT 39.4 02/20/2021   PLT 309 02/20/2021   GLUCOSE 82 02/20/2021   CHOL 161 02/20/2021   TRIG 46 02/20/2021   HDL 86 02/20/2021   LDLCALC 65 02/20/2021   ALT 17 02/20/2021   AST 21 02/20/2021   NA 145 (H) 02/20/2021   K 4.4 02/20/2021   CL 103 02/20/2021   CREATININE 0.66 02/20/2021   BUN 11 02/20/2021   CO2 21 02/20/2021   TSH 1.660 02/20/2021   INR 1.2 09/26/2008   HGBA1C 5.5 02/20/2021    Lab Results  Component Value Date   TSH 1.660 02/20/2021   Lab Results  Component Value Date   WBC 4.2 02/20/2021   HGB 13.0 02/20/2021   HCT 39.4 02/20/2021   MCV 93 02/20/2021   PLT 309 02/20/2021   Lab Results  Component Value Date   NA 145 (H) 02/20/2021   K 4.4 02/20/2021   CO2 21 02/20/2021   GLUCOSE 82 02/20/2021   BUN 11 02/20/2021   CREATININE 0.66 02/20/2021   BILITOT 0.6 02/20/2021   ALKPHOS 93 02/20/2021  AST 21 02/20/2021   ALT 17 02/20/2021   PROT 7.1 02/20/2021   ALBUMIN 4.2 02/20/2021   CALCIUM 9.3 02/20/2021   ANIONGAP 10 04/06/2020   EGFR 99 02/20/2021   GFR 88.44 08/22/2020   Lab Results  Component Value Date   CHOL 161 02/20/2021   Lab Results  Component Value Date   HDL 86 02/20/2021   Lab Results  Component Value Date   LDLCALC 65 02/20/2021   Lab Results  Component Value Date   TRIG 46 02/20/2021   Lab Results  Component Value Date   CHOLHDL 2 08/22/2020   Lab Results  Component Value Date   HGBA1C 5.5 02/20/2021       Assessment & Plan:   Problem List Items Addressed This Visit        Unprioritized   COVID-19 - Primary    paxlovid sent in con't tessalon as needed Call back if symptoms worsen Quarantine 5 days then can leave the house with mask on after that if symptoms improve       Relevant Medications   nirmatrelvir/ritonavir EUA (PAXLOVID) TABS   Other Relevant Orders   MyChart COVID-19 home monitoring program   Temperature monitoring     Meds ordered this encounter  Medications   nirmatrelvir/ritonavir EUA (PAXLOVID) TABS    Sig: Take 3 tablets by mouth 2 (two) times daily for 5 days. (Take nirmatrelvir 150 mg two tablets twice daily for 5 days and ritonavir 100 mg one tablet twice daily for 5 days) Patient GFR is 99    Dispense:  30 tablet    Refill:  0    I discussed the assessment and treatment plan with the patient. The patient was provided an opportunity to ask questions and all were answered. The patient agreed with the plan and demonstrated an understanding of the instructions.   The patient was advised to call back or seek an in-person evaluation if the symptoms worsen or if the condition fails to improve as anticipated.  I provided 20 minutes of face-to-face time during this encounter.   Ann Held, DO [place] [facility] [phone] (phone) [phone] (fax)  [place]

## 2021-04-26 NOTE — Assessment & Plan Note (Signed)
paxlovid sent in con't tessalon as needed Call back if symptoms worsen Quarantine 5 days then can leave the house with mask on after that if symptoms improve

## 2021-04-30 ENCOUNTER — Telehealth: Payer: Self-pay

## 2021-04-30 ENCOUNTER — Ambulatory Visit (INDEPENDENT_AMBULATORY_CARE_PROVIDER_SITE_OTHER): Payer: Medicare Other | Admitting: Bariatrics

## 2021-04-30 NOTE — Telephone Encounter (Signed)
Pt says she feels some better and will continue her current regimen. She will call if her sxs change or worsen.

## 2021-04-30 NOTE — Telephone Encounter (Signed)
Patient called stating she took the Paxlovid over the weekend (took 2 doses, 1 day's worth of medication) and it wreaked havoc on her digestive system.  She stopped taking the medication immediately.  Patient is wanting to know if there is another medication she can take in its place.  Please advise.

## 2021-05-01 ENCOUNTER — Ambulatory Visit (INDEPENDENT_AMBULATORY_CARE_PROVIDER_SITE_OTHER): Payer: Medicare Other

## 2021-05-01 VITALS — Ht 62.0 in | Wt 183.0 lb

## 2021-05-01 DIAGNOSIS — Z0000 Encounter for general adult medical examination without abnormal findings: Secondary | ICD-10-CM | POA: Diagnosis not present

## 2021-05-01 NOTE — Patient Instructions (Signed)
[name] , Thank you for taking time to complete your Medicare Wellness Visit. I appreciate your ongoing commitment to your health goals. Please review the following plan we discussed and let me know if I can assist you in the future.   Screening recommendations/referrals: Colonoscopy: Completed 04/13/2020-Due 04/13/2030 Mammogram: Completed at GYN office 02/08/2021-Due 02/08/2022 Bone Density: Not yet indicated. Due at age 65 Recommended yearly ophthalmology/optometry visit for glaucoma screening and checkup Recommended yearly dental visit for hygiene and checkup  Vaccinations: Influenza vaccine: Declined Pneumococcal vaccine: Due at age 89 Tdap vaccine: Up to date-Due 03/16/2024 Shingles vaccine: Discuss with pharmacy  Covid-19: Completed 3 doses  Advanced directives: Information mailed today  Conditions/risks identified: See problem list  Next appointment: Follow up in one year for your annual wellness visit.   Preventive Care 40-64 Years, Female Preventive care refers to lifestyle choices and visits with your health care provider that can promote health and wellness. What does preventive care include? A yearly physical exam. This is also called an annual well check. Dental exams once or twice a year. Routine eye exams. Ask your health care provider how often you should have your eyes checked. Personal lifestyle choices, including: Daily care of your teeth and gums. Regular physical activity. Eating a healthy diet. Avoiding tobacco and drug use. Limiting alcohol use. Practicing safe sex. Taking low-dose aspirin daily starting at age 75. Taking vitamin and mineral supplements as recommended by your health care provider. What happens during an annual well check? The services and screenings done by your health care provider during your annual well check will depend on your age, overall health, lifestyle risk factors, and family history of disease. Counseling  Your health care  provider may ask you questions about your: Alcohol use. Tobacco use. Drug use. Emotional well-being. Home and relationship well-being. Sexual activity. Eating habits. Work and work Statistician. Method of birth control. Menstrual cycle. Pregnancy history. Screening  You may have the following tests or measurements: Height, weight, and BMI. Blood pressure. Lipid and cholesterol levels. These may be checked every 5 years, or more frequently if you are over 23 years old. Skin check. Lung cancer screening. You may have this screening every year starting at age 42 if you have a 30-pack-year history of smoking and currently smoke or have quit within the past 15 years. Fecal occult blood test (FOBT) of the stool. You may have this test every year starting at age 93. Flexible sigmoidoscopy or colonoscopy. You may have a sigmoidoscopy every 5 years or a colonoscopy every 10 years starting at age 64. Hepatitis C blood test. Hepatitis B blood test. Sexually transmitted disease (STD) testing. Diabetes screening. This is done by checking your blood sugar (glucose) after you have not eaten for a while (fasting). You may have this done every 1-3 years. Mammogram. This may be done every 1-2 years. Talk to your health care provider about when you should start having regular mammograms. This may depend on whether you have a family history of breast cancer. BRCA-related cancer screening. This may be done if you have a family history of breast, ovarian, tubal, or peritoneal cancers. Pelvic exam and Pap test. This may be done every 3 years starting at age 74. Starting at age 34, this may be done every 5 years if you have a Pap test in combination with an HPV test. Bone density scan. This is done to screen for osteoporosis. You may have this scan if you are at high risk for osteoporosis. Discuss  your test results, treatment options, and if necessary, the need for more tests with your health care  provider. Vaccines  Your health care provider may recommend certain vaccines, such as: Influenza vaccine. This is recommended every year. Tetanus, diphtheria, and acellular pertussis (Tdap, Td) vaccine. You may need a Td booster every 10 years. Zoster vaccine. You may need this after age 37. Pneumococcal 13-valent conjugate (PCV13) vaccine. You may need this if you have certain conditions and were not previously vaccinated. Pneumococcal polysaccharide (PPSV23) vaccine. You may need one or two doses if you smoke cigarettes or if you have certain conditions. Talk to your health care provider about which screenings and vaccines you need and how often you need them. This information is not intended to replace advice given to you by your health care provider. Make sure you discuss any questions you have with your health care provider. Document Released: 10/13/2015 Document Revised: 06/05/2016 Document Reviewed: 07/18/2015 Elsevier Interactive Patient Education  2017 [employer] Prevention in the Home Falls can cause injuries. They can happen to people of all ages. There are many things you can do to make your home safe and to help prevent falls. What can I do on the outside of my home? Regularly fix the edges of walkways and driveways and fix any cracks. Remove anything that might make you trip as you walk through a door, such as a raised step or threshold. Trim any bushes or trees on the path to your home. Use bright outdoor lighting. Clear any walking paths of anything that might make someone trip, such as rocks or tools. Regularly check to see if handrails are loose or broken. Make sure that both sides of any steps have handrails. Any raised decks and porches should have guardrails on the edges. Have any leaves, snow, or ice cleared regularly. Use sand or salt on walking paths during winter. Clean up any spills in your garage right away. This includes oil or grease spills. What  can I do in the bathroom? Use night lights. Install grab bars by the toilet and in the tub and shower. Do not use towel bars as grab bars. Use non-skid mats or decals in the tub or shower. If you need to sit down in the shower, use a plastic, non-slip stool. Keep the floor dry. Clean up any water that spills on the floor as soon as it happens. Remove soap buildup in the tub or shower regularly. Attach bath mats securely with double-sided non-slip rug tape. Do not have throw rugs and other things on the floor that can make you trip. What can I do in the bedroom? Use night lights. Make sure that you have a light by your bed that is easy to reach. Do not use any sheets or blankets that are too big for your bed. They should not hang down onto the floor. Have a firm chair that has side arms. You can use this for support while you get dressed. Do not have throw rugs and other things on the floor that can make you trip. What can I do in the kitchen? Clean up any spills right away. Avoid walking on wet floors. Keep items that you use a lot in easy-to-reach places. If you need to reach something above you, use a strong step stool that has a grab bar. Keep electrical cords out of the way. Do not use floor polish or wax that makes floors slippery. If you must use wax, use  non-skid floor wax. Do not have throw rugs and other things on the floor that can make you trip. What can I do with my stairs? Do not leave any items on the stairs. Make sure that there are handrails on both sides of the stairs and use them. Fix handrails that are broken or loose. Make sure that handrails are as long as the stairways. Check any carpeting to make sure that it is firmly attached to the stairs. Fix any carpet that is loose or worn. Avoid having throw rugs at the top or bottom of the stairs. If you do have throw rugs, attach them to the floor with carpet tape. Make sure that you have a light switch at the top of the  stairs and the bottom of the stairs. If you do not have them, ask someone to add them for you. What else can I do to help prevent falls? Wear shoes that: Do not have high heels. Have rubber bottoms. Are comfortable and fit you well. Are closed at the toe. Do not wear sandals. If you use a stepladder: Make sure that it is fully opened. Do not climb a closed stepladder. Make sure that both sides of the stepladder are locked into place. Ask someone to hold it for you, if possible. Clearly mark and make sure that you can see: Any grab bars or handrails. First and last steps. Where the edge of each step is. Use tools that help you move around (mobility aids) if they are needed. These include: Canes. Walkers. Scooters. Crutches. Turn on the lights when you go into a dark area. Replace any light bulbs as soon as they burn out. Set up your furniture so you have a clear path. Avoid moving your furniture around. If any of your floors are uneven, fix them. If there are any pets around you, be aware of where they are. Review your medicines with your doctor. Some medicines can make you feel dizzy. This can increase your chance of falling. Ask your doctor what other things that you can do to help prevent falls. This information is not intended to replace advice given to you by your health care provider. Make sure you discuss any questions you have with your health care provider. Document Released: 07/13/2009 Document Revised: 02/22/2016 Document Reviewed: 10/21/2014 Elsevier Interactive Patient Education  2017 [employer].

## 2021-05-01 NOTE — Progress Notes (Signed)
Subjective:   [name] is a 65 y.o. female who presents for Medicare Annual (Subsequent) preventive examination.  I connected with Madisan today by telephone and verified that I am speaking with the correct person using two identifiers. Location patient: home Location provider: work Persons participating in the virtual visit: patient, Engineer, civil (consulting).    I discussed the limitations, risks, security and privacy concerns of performing an evaluation and management service by telephone and the availability of in person appointments. I also discussed with the patient that there may be a patient responsible charge related to this service. The patient expressed understanding and verbally consented to this telephonic visit.    Interactive audio and video telecommunications were attempted between this provider and patient, however failed, due to patient having technical difficulties OR patient did not have access to video capability.  We continued and completed visit with audio only.  Some vital signs may be absent or patient reported.   Time Spent with patient on telephone encounter: 20 minutes   Review of Systems     Cardiac Risk Factors include: hypertension;obesity (BMI >30kg/m2)     Objective:    Today's Vitals   05/01/21 0821  Weight: 183 lb (83 kg)  Height:  (1.575 m)   Body mass index is 33.47 kg/m.  Advanced Directives 05/01/2021 04/06/2020 06/08/2019 02/09/2018 03/07/2016 03/05/2016  Does Patient Have a Medical Advance Directive? No No No No No No  Would patient like information on creating a medical advance directive? Yes (MAU/Ambulatory/Procedural Areas - Information given) - No - Patient declined Yes (MAU/Ambulatory/Procedural Areas - Information given) No - patient declined information No - patient declined information    Current Medications (verified) Outpatient Encounter Medications as of 05/01/2021  Medication Sig   Biotin w/ Vitamins C & E (HAIR SKIN & NAILS GUMMIES PO) Take  1 each by mouth daily.   Calcium Carb-Cholecalciferol (CALTRATE 600+D3 SOFT PO) Take by mouth.   esomeprazole (NEXIUM) 40 MG capsule Take 1 capsule (40 mg total) by mouth daily as needed. Crack open capsule and ingest contents   nirmatrelvir/ritonavir EUA (PAXLOVID) TABS Take 3 tablets by mouth 2 (two) times daily for 5 days. (Take nirmatrelvir 150 mg two tablets twice daily for 5 days and ritonavir 100 mg one tablet twice daily for 5 days) Patient GFR is 99   Phentermine HCl (LOMAIRA) 8 MG TABS Will take with her lunch daily   predniSONE (DELTASONE) 5 MG tablet Take 5 mg by mouth as needed.   vitamin B-12 (CYANOCOBALAMIN) 1000 MCG tablet Take 1,000 mcg by mouth daily.   Vitamin D, Ergocalciferol, (DRISDOL) 1.25 MG (50000 UNIT) CAPS capsule Take 1 capsule (50,000 Units total) by mouth every 7 (seven) days.   No facility-administered encounter medications on file as of 05/01/2021.    Allergies (verified) Aspirin, Codeine, Morphine, and Sulfonamide derivatives   History: Past Medical History:  Diagnosis Date   Abdominal pain, other specified site 03/31/2008   Centricity Description: FLANK PAIN, RIGHT Qualifier: Diagnosis of  By: Alwyn Ren MD, Chrissie Noa   Centricity Description: ABDOMINAL PAIN OTHER SPECIFIED SITE Qualifier: Diagnosis of  By: Janit Bern     Acute sprain or strain of cervical region 04/10/2012   ALLERGIC RHINITIS 06/26/2007   Qualifier: Diagnosis of  By: Charlsie Quest RMA, Lucy     ANKYLOSING SPONDYLITIS 03/31/2008   Qualifier: Diagnosis of  By: Alwyn Ren MD, Chrissie Noa     Ankylosing spondylitis (HCC)    neck and spin   Ankylosing spondylitis of multiple  sites in spine [facility](HCC)    Arthritis    Back pain    Bariatric surgery status 01/14/2008   Qualifier: Diagnosis of  By: Laury AxonLowne DO, Myrene BuddyYvonne     Blood in stool 12/02/2008   Qualifier: Diagnosis of  By: Janit BernLowne DO, Yvonne     Blood transfusion without reported diagnosis    Calf pain 10/13/2012   CHEST PAIN UNSPECIFIED 10/11/2009   Qualifier: Diagnosis  of  By: Arlyce DiceKaplan MD, Barbette Hairobert D    CONSTIPATION 04/06/2010   Qualifier: Diagnosis of  By: Janit BernLowne DO, Yvonne     CORNEAL ABRASION, LEFT 01/22/2010   Qualifier: Diagnosis of  By: Janit BernLowne DO, Yvonne     Gastritis    GERD 06/26/2007   Qualifier: Diagnosis of  By: Charlsie QuestBrand RMA, Lucy     GERD (gastroesophageal reflux disease)    Hiatal hernia    Hypertension    no medications now   IBS 06/26/2007   Qualifier: Diagnosis of  By: Charlsie QuestBrand RMA, Lucy     Joint pain    Lactose intolerance    LEG CRAMPS, NOCTURNAL 11/14/2010   Qualifier: Diagnosis of  By: Floydene FlockHoops, Regina     Long-term current use of steroids 04/20/2013   LYMPHADENOPATHY 06/10/2007   Qualifier: Diagnosis of  By: Janit BernLowne DO, Yvonne     MORBID OBESITY 06/26/2007   Qualifier: Diagnosis of  By: Samara SnideBrand RMA, Lucy     MUSCLE PAIN 03/31/2008   Qualifier: Diagnosis of  By: Alwyn RenHopper MD, William     Noninfectious gastroenteritis and colitis 03/17/2013   Obesity (BMI 30-39.9) 03/16/2014   OSA (obstructive sleep apnea) 03/08/2016   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/23/2010   Qualifier: Diagnosis of  By: Janit BernLowne DO, Yvonne     Rheumatoid arthritis (HCC)    Seasonal allergies 05/14/2015   THYROID CYST 06/26/2007   Qualifier: Diagnosis of  By: Charlsie QuestBrand RMA, Lucy     THYROID NODULE, HX OF 06/10/2007   Qualifier: Diagnosis of  By: Janit BernLowne DO, Yvonne     Vitamin D deficiency    Past Surgical History:  Procedure Laterality Date   BARIATRIC SURGERY     BREAST REDUCTION SURGERY  1996   COLONOSCOPY  10/24/2009   kaplan-normal exam   ESOPHAGOGASTRODUODENOSCOPY     pelvis replacement  2010   TOTAL HIP ARTHROPLASTY     x 2 left, one revision   UPPER GASTROINTESTINAL ENDOSCOPY     Family History  Problem Relation Age of Onset   Hypertension Mother    High Cholesterol Mother    Diabetes Mother    Arthritis Father    Hypertension Father    Diabetes Father    High Cholesterol Father    Melanoma Maternal Grandmother    Breast cancer Maternal Grandmother        and  Aunt   Hypertension Sister    Diabetes Sister    Colon cancer Neg Hx    Esophageal cancer Neg Hx    Rectal cancer Neg Hx    Stomach cancer Neg Hx    Colon polyps Neg Hx    Social History   Socioeconomic History   Marital status: Married    Spouse name: Yehuda Maondre Mcdiarmid   Number of children: Not on file   Years of education: Not on file   Highest education level: Not on file  Occupational History   Occupation: retired  Tobacco Use   Smoking status: Never   Smokeless tobacco: Never  Vaping Use   Vaping  Use: Never used  Substance and Sexual Activity   Alcohol use: No    Alcohol/week: 0.0 standard drinks   Drug use: No   Sexual activity: Yes  Other Topics Concern   Not on file  Social History Narrative   Occupation: Sports coach   Daily Caffeine use- 1 cup daily      Social Determinants of Health   Financial Resource Strain: Low Risk    Difficulty of Paying Living Expenses: Not hard at all  Food Insecurity: No Food Insecurity   Worried About Programme researcher, broadcasting/film/video in the Last Year: Never true   Barista in the Last Year: Never true  Transportation Needs: No Transportation Needs   Lack of Transportation (Medical): No   Lack of Transportation (Non-Medical): No  Physical Activity: Sufficiently Active   Days of Exercise per Week: 3 days   Minutes of Exercise per Session: 50 min  Stress: No Stress Concern Present   Feeling of Stress : Not at all  Social Connections: Moderately Integrated   Frequency of Communication with Friends and Family: More than three times a week   Frequency of Social Gatherings with Friends and Family: More than three times a week   Attends Religious Services: More than 4 times per year   Active Member of [employer] or Organizations: No   Attends Engineer, structural: Never   Marital Status: Married    Tobacco Counseling Counseling given: Not Answered   Clinical Intake:  Pre-visit preparation completed: Yes  Pain : No/denies  pain     Nutritional Status: BMI > 30  Obese Nutritional Risks: None Diabetes: No  How often do you need to have someone help you when you read instructions, pamphlets, or other written materials from your doctor or pharmacy?: 1 - Never  Diabetic?No  Interpreter Needed?: No  Information entered by :: Thomasenia Sales LPN   Activities of Daily Living In your present state of health, do you have any difficulty performing the following activities: 05/01/2021  Hearing? N  Vision? N  Difficulty concentrating or making decisions? N  Walking or climbing stairs? N  Dressing or bathing? N  Doing errands, shopping? N  Preparing Food and eating ? N  Using the Toilet? N  In the past six months, have you accidently leaked urine? N  Do you have problems with loss of bowel control? N  Managing your Medications? N  Managing your Finances? N  Housekeeping or managing your Housekeeping? N  Some recent data might be hidden    Patient Care Team: Zola Button, Grayling Congress, DO as PCP - General Candice Camp, MD as Consulting Physician (Obstetrics and Gynecology) Cameron Proud, Onalee Hua, MD as Referring Physician (Surgery) Coralyn Helling, MD as Consulting Physician (Pulmonary Disease) Pollyann Savoy, MD as Consulting Physician (Rheumatology) Jake Bathe, MD as Consulting Physician (Cardiology) Armbruster, Willaim Rayas, MD as Consulting Physician (Gastroenterology) Carlus Pavlov, MD as Consulting Physician (Endocrinology) Phipps, C. Onalee Hua, MD (Otolaryngology) Durene Romans, MD as Consulting Physician (Orthopedic Surgery)  Indicate any recent Medical Services you may have received from other than [facility] providers in the past year (date may be approximate).     Assessment:   This is a routine wellness examination for Tenino.  Hearing/Vision screen Vision Screening - Comments:: Last eye exam-03/2021-[facility]  Dietary issues and exercise activities discussed: Current Exercise Habits: Home  exercise routine, Type of exercise: strength training/weights;walking, Time (Minutes): 45, Frequency (Times/Week): 3, Weekly Exercise (Minutes/Week): 135, Intensity: Mild, Exercise limited by:  None identified   Goals Addressed             This Visit's Progress    Drink at least 6 glasses of water per day.   On track    Increase physical activity   On track    Work out 3x/ week        Depression Screen PHQ 2/9 Scores 05/01/2021 02/20/2021 02/29/2020 06/08/2019 02/18/2018 02/09/2018 06/11/2016  PHQ - 2 Score 0 0 3 0 1 0 0  PHQ- 9 Score - 6 11 - 8 - -  Exception Documentation - - - - Medical reason - -    Fall Risk Fall Risk  05/01/2021 06/08/2019 02/09/2018 03/05/2016  Falls in the past year? 0 1 Yes Yes  Comment - - - Fell down steps and tripped over raised area on the road.   Number falls in past yr: 0 1 2 or more 2 or more  Injury with Fall? 0 0 No No  Risk for fall due to : - (No Data) History of fall(s) Impaired balance/gait;History of fall(s)  Risk for fall due to: Comment - slipped x2 - -  Follow up Falls prevention discussed Education provided;Falls prevention discussed Education provided;Falls prevention discussed Education provided;Falls prevention discussed    FALL RISK PREVENTION PERTAINING TO THE HOME:  Any stairs in or around the home? Yes  If so, are there any without handrails? No  Home free of loose throw rugs in walkways, pet beds, electrical cords, etc? Yes  Adequate lighting in your home to reduce risk of falls? Yes   ASSISTIVE DEVICES UTILIZED TO PREVENT FALLS:  Life alert? No  Use of a cane, walker or w/c? No  Grab bars in the bathroom? No  Shower chair or bench in shower? No  Elevated toilet seat or a handicapped toilet? No   TIMED UP AND GO:  Was the test performed? No . Phone visit   Cognitive Function:Normal cognitive status assessed by  this Nurse Health Advisor. No abnormalities found.   MMSE - Mini Mental State Exam 03/05/2016  Orientation to time 5   Orientation to Place 5  Registration 3  Attention/ Calculation 5  Recall 3  Language- name 2 objects 2  Language- repeat 1  Language- follow 3 step command 3  Language- read & follow direction 1  Write a sentence 1  Copy design 1  Total score 30     6CIT Screen 06/08/2019  What Year? 0 points  What month? 0 points  What time? 0 points  Count back from 20 0 points  Months in reverse 0 points  Repeat phrase 4 points  Total Score 4    Immunizations Immunization History  Administered Date(s) Administered   Influenza Whole 06/22/2010   PFIZER(Purple Top)SARS-COV-2 Vaccination 11/12/2019, 12/07/2019, 09/14/2020   Tdap 03/16/2014    TDAP status: Up to date  Flu Vaccine status: Declined, Education has been provided regarding the importance of this vaccine but patient still declined. Advised may receive this vaccine at local pharmacy or Health Dept. Aware to provide a copy of the vaccination record if obtained from local pharmacy or Health Dept. Verbalized acceptance and understanding.  Pneumococcal vaccines status: Not yet indicated  Covid-19 vaccine status: Completed vaccines  Qualifies for Shingles Vaccine? Yes   Zostavax completed No   Shingrix Completed?: No.    Education has been provided regarding the importance of this vaccine. Patient has been advised to call insurance company to determine out of pocket expense if  they have not yet received this vaccine. Advised may also receive vaccine at local pharmacy or Health Dept. Verbalized acceptance and understanding.  Screening Tests Health Maintenance  Topic Date Due   Zoster Vaccines- Shingrix (1 of 2) Never done   MAMMOGRAM  09/30/2020   COVID-19 Vaccine (4 - Booster for Pfizer series) 01/13/2021   INFLUENZA VACCINE  04/30/2021   PAP SMEAR-Modifier  09/30/2021   TETANUS/TDAP  03/16/2024   COLONOSCOPY (Pts 45-31yrs Insurance coverage will need to be confirmed)  04/13/2030   Hepatitis C Screening  Completed   HIV  Screening  Completed   Pneumococcal Vaccine 71-39 Years old  Aged Out   HPV VACCINES  Aged Out    Health Maintenance  Health Maintenance Due  Topic Date Due   Zoster Vaccines- Shingrix (1 of 2) Never done   MAMMOGRAM  09/30/2020   COVID-19 Vaccine (4 - Booster for Pfizer series) 01/13/2021   INFLUENZA VACCINE  04/30/2021    Colorectal cancer screening: Type of screening: Colonoscopy. Completed 04/13/2020. Repeat every 10 years  Mammogram status: Completed 02/08/2021 per patient at GYN office. Repeat every year  Bone Density status: Not yet indicated  Lung Cancer Screening: (Low Dose CT Chest recommended if Age 42-80 years, 30 pack-year currently smoking OR have quit w/in 15years.) does not qualify.     Additional Screening:  Hepatitis C Screening: Completed 11/17/2017  Vision Screening: Recommended annual ophthalmology exams for early detection of glaucoma and other disorders of the eye. Is the patient up to date with their annual eye exam?  Yes  Who is the provider or what is the name of the office in which the patient attends annual eye exams? [facility]    Dental Screening: Recommended annual dental exams for proper oral hygiene  Community Resource Referral / Chronic Care Management: CRR required this visit?  No   CCM required this visit?  No      Plan:     I have personally reviewed and noted the following in the patient's chart:   Medical and social history Use of alcohol, tobacco or illicit drugs  Current medications and supplements including opioid prescriptions.  Functional ability and status Nutritional status Physical activity Advanced directives List of other physicians Hospitalizations, surgeries, and ER visits in previous 12 months Vitals Screenings to include cognitive, depression, and falls Referrals and appointments  In addition, I have reviewed and discussed with patient certain preventive protocols, quality metrics, and best practice  recommendations. A written personalized care plan for preventive services as well as general preventive health recommendations were provided to patient.   Due to this being a telephonic visit, the after visit summary with patients personalized plan was offered to patient via mail or my-chart. Patient would like to access on my-chart.   Roanna Raider, LPN   06/02/2354  Nurse health Advisor  Nurse Notes: None

## 2021-05-03 ENCOUNTER — Ambulatory Visit: Payer: Medicare Other | Admitting: Physical Therapy

## 2021-05-22 ENCOUNTER — Encounter (INDEPENDENT_AMBULATORY_CARE_PROVIDER_SITE_OTHER): Payer: Self-pay | Admitting: Bariatrics

## 2021-05-22 ENCOUNTER — Ambulatory Visit (INDEPENDENT_AMBULATORY_CARE_PROVIDER_SITE_OTHER): Payer: Medicare Other | Admitting: Bariatrics

## 2021-05-22 ENCOUNTER — Other Ambulatory Visit: Payer: Self-pay

## 2021-05-22 VITALS — BP 145/88 | HR 62 | Temp 97.6°F | Ht 62.0 in | Wt 183.0 lb

## 2021-05-22 DIAGNOSIS — E669 Obesity, unspecified: Secondary | ICD-10-CM

## 2021-05-22 DIAGNOSIS — I10 Essential (primary) hypertension: Secondary | ICD-10-CM | POA: Diagnosis not present

## 2021-05-22 DIAGNOSIS — Z6834 Body mass index (BMI) 34.0-34.9, adult: Secondary | ICD-10-CM

## 2021-05-22 DIAGNOSIS — E559 Vitamin D deficiency, unspecified: Secondary | ICD-10-CM

## 2021-05-22 MED ORDER — VITAMIN D (ERGOCALCIFEROL) 1.25 MG (50000 UNIT) PO CAPS
50000.0000 [IU] | ORAL_CAPSULE | ORAL | 0 refills | Status: DC
Start: 2021-05-22 — End: 2021-06-20

## 2021-05-22 NOTE — Progress Notes (Signed)
Chief Complaint:   OBESITY [name] is here to discuss her progress with her obesity treatment plan along with follow-up of her obesity related diagnoses. [name] is on the Category 1 Plan and states she is following her eating plan approximately 50% of the time. [name] states she is doing 0 minutes 0 times per week.  Today's visit was #: 5 Starting weight: 191 lbs Starting date: 02/20/2021 Today's weight: 183 lbs Today's date: 05/22/2021 Total lbs lost to date: 8 lbs Total lbs lost since last in-office visit: 4 lbs  Interim History: [name] is down an additional 4 lbs and doing well overall.  Subjective:   1. Vitamin D insufficiency [name] is taking Vitamin D as directed.  2. Essential hypertension [name] is currently not on medications. Her blood pressure was taken before gastric bypass.  Assessment/Plan:   1. Vitamin D insufficiency  Low Vitamin D level contributes to fatigue and are associated with obesity, breast, and colon cancer. We will refill prescription Vitamin D 50,000 IU every week for 1 month with no refills and she will follow-up for routine testing of Vitamin D, at least 2-3 times per year to avoid over-replacement.  Vitamin D, Ergocalciferol, (DRISDOL) 1.25 MG (50000 UNIT) CAPS capsule; Take 1 capsule (50,000 Units total) by mouth every 7 (seven) days.  Dispense: 4 capsule; Refill: 0  2. Essential hypertension [name] will continue to work on plan and exercise. She will continue with no added salt. She is working on healthy weight loss and exercise to improve blood pressure control. We will watch for signs of hypotension as she continues her lifestyle modifications.   3. Obesity with current BMI of 33.6 [name] is currently in the action stage of change. As such, her goal is to continue with weight loss efforts. She has agreed to the Category 1 Plan.   [name] will continue meal planning. She will intentional eating. She will get back on  track.  Exercise goals:  [name] will decrease weights and exercise.  Behavioral modification strategies: increasing lean protein intake, decreasing simple carbohydrates, increasing vegetables, increasing water intake, decreasing eating out, no skipping meals, meal planning and cooking strategies, keeping healthy foods in the home, and planning for success.  [name] has agreed to follow-up with our clinic in 2 weeks (fasting). She was informed of the importance of frequent follow-up visits to maximize her success with intensive lifestyle modifications for her multiple health conditions.   Objective:   Blood pressure (!) 145/88, pulse 62, temperature 97.6 F (36.4 C), height 5\' 2"  (1.575 m), weight 183 lb (83 kg), SpO2 99 %. Body mass index is 33.47 kg/m. [name] is wearing a wrist and lower arm brace.  General: Cooperative, alert, well developed, in no acute distress. HEENT: Conjunctivae and lids unremarkable. Cardiovascular: Regular rhythm.  Lungs: Normal work of breathing. Neurologic: No focal deficits.   Lab Results  Component Value Date   CREATININE 0.66 02/20/2021   BUN 11 02/20/2021   NA 145 (H) 02/20/2021   K 4.4 02/20/2021   CL 103 02/20/2021   CO2 21 02/20/2021   Lab Results  Component Value Date   ALT 17 02/20/2021   AST 21 02/20/2021   ALKPHOS 93 02/20/2021   BILITOT 0.6 02/20/2021   Lab Results  Component Value Date   HGBA1C 5.5 02/20/2021   HGBA1C 5.4 08/02/2019   HGBA1C 5.3 02/18/2018   HGBA1C 5.5 06/11/2016   HGBA1C 5.5 04/20/2013   Lab Results  Component Value Date   INSULIN 3.5  02/20/2021   INSULIN 3.0 02/18/2018   Lab Results  Component Value Date   TSH 1.660 02/20/2021   Lab Results  Component Value Date   CHOL 161 02/20/2021   HDL 86 02/20/2021   LDLCALC 65 02/20/2021   TRIG 46 02/20/2021   CHOLHDL 2 08/22/2020   Lab Results  Component Value Date   VD25OH 31.0 02/20/2021   VD25OH 43.61 08/22/2020   VD25OH 39.9 02/29/2020   Lab  Results  Component Value Date   WBC 4.2 02/20/2021   HGB 13.0 02/20/2021   HCT 39.4 02/20/2021   MCV 93 02/20/2021   PLT 309 02/20/2021   Lab Results  Component Value Date   IRON 120 06/27/2010   FERRITIN 42.7 06/27/2010    Obesity Behavioral Intervention:   Approximately 15 minutes were spent on the discussion below.  ASK: We discussed the diagnosis of obesity with [name] today and [name] agreed to give [place] permission to discuss obesity behavioral modification therapy today.  ASSESS: [name] has the diagnosis of obesity and her BMI today is 33.6. [name] is in the action stage of change.   ADVISE: [name] was educated on the multiple health risks of obesity as well as the benefit of weight loss to improve her health. She was advised of the need for long term treatment and the importance of lifestyle modifications to improve her current health and to decrease her risk of future health problems.  AGREE: Multiple dietary modification options and treatment options were discussed and [name] agreed to follow the recommendations documented in the above note.  ARRANGE: [name] was educated on the importance of frequent visits to treat obesity as outlined per CMS and USPSTF guidelines and agreed to schedule her next follow up appointment today.  Attestation Statements:   Reviewed by clinician on day of visit: allergies, medications, problem list, medical history, surgical history, family history, social history, and previous encounter notes.  I, Jackson Latino, RMA, am acting as Energy manager for [employer], DO.   I have reviewed the above documentation for accuracy and completeness, and I agree with the above. Corinna Capra, DO

## 2021-05-23 ENCOUNTER — Encounter (INDEPENDENT_AMBULATORY_CARE_PROVIDER_SITE_OTHER): Payer: Self-pay | Admitting: Bariatrics

## 2021-05-29 DIAGNOSIS — M79642 Pain in left hand: Secondary | ICD-10-CM | POA: Diagnosis not present

## 2021-05-29 DIAGNOSIS — M654 Radial styloid tenosynovitis [de Quervain]: Secondary | ICD-10-CM | POA: Diagnosis not present

## 2021-05-30 ENCOUNTER — Other Ambulatory Visit: Payer: Self-pay | Admitting: Medical

## 2021-06-13 ENCOUNTER — Telehealth: Payer: Self-pay | Admitting: Internal Medicine

## 2021-06-13 DIAGNOSIS — G4733 Obstructive sleep apnea (adult) (pediatric): Secondary | ICD-10-CM

## 2021-06-13 NOTE — Telephone Encounter (Signed)
Ok to order HST for dx OSA. She can call us about 12 weeks after the test for results.

## 2021-06-13 NOTE — Telephone Encounter (Signed)
If CY would like pt to have a sleep study, an order will need to be placed.

## 2021-06-13 NOTE — Telephone Encounter (Signed)
Dr. Maple Hudson, please advise if you are okay with pt repeating HST.

## 2021-06-13 NOTE — Telephone Encounter (Signed)
Called and spoke with pt letting her know that CY was okay with her repeating HST and she verbalized understanding. Order has been placed. Nothing further needed.

## 2021-06-20 ENCOUNTER — Encounter (INDEPENDENT_AMBULATORY_CARE_PROVIDER_SITE_OTHER): Payer: Self-pay | Admitting: Bariatrics

## 2021-06-20 ENCOUNTER — Other Ambulatory Visit: Payer: Self-pay

## 2021-06-20 ENCOUNTER — Ambulatory Visit (INDEPENDENT_AMBULATORY_CARE_PROVIDER_SITE_OTHER): Payer: Medicare Other | Admitting: Bariatrics

## 2021-06-20 VITALS — BP 137/82 | HR 63 | Temp 97.7°F | Ht 62.0 in | Wt 184.0 lb

## 2021-06-20 DIAGNOSIS — E669 Obesity, unspecified: Secondary | ICD-10-CM

## 2021-06-20 DIAGNOSIS — Z6834 Body mass index (BMI) 34.0-34.9, adult: Secondary | ICD-10-CM | POA: Diagnosis not present

## 2021-06-20 DIAGNOSIS — E559 Vitamin D deficiency, unspecified: Secondary | ICD-10-CM

## 2021-06-20 DIAGNOSIS — I10 Essential (primary) hypertension: Secondary | ICD-10-CM

## 2021-06-20 DIAGNOSIS — R7309 Other abnormal glucose: Secondary | ICD-10-CM

## 2021-06-20 MED ORDER — VITAMIN D (ERGOCALCIFEROL) 1.25 MG (50000 UNIT) PO CAPS: 50000.0000 [IU] | ORAL_CAPSULE | ORAL | 0 refills | Status: DC

## 2021-06-20 NOTE — Progress Notes (Signed)
Chief Complaint:   OBESITY [name] is here to discuss her progress with her obesity treatment plan along with follow-up of her obesity related diagnoses. [name] is on the Category 1 Plan and states she is following her eating plan approximately 50% of the time. [name] states she is doing 0 minutes 0 times per week.  Today's visit was #: 6 Starting weight: 191 lbs Starting date: 02/20/2021 Today's weight: 184 lbs Today's date: 06/20/2021 Total lbs lost to date: 7 lbs Total lbs lost since last in-office visit: 0  Interim History: [name] is up 1 lb since her last visit. She has been eating more food. Her nighttime eating is less.   Subjective:   1. Vitamin D insufficiency She is currently taking prescription vitamin D 50,000 IU each week. She denies nausea, vomiting or muscle weakness.  2. Essential hypertension [name]'s hypertension is reasonably controlled.   3. Elevated glucose She is not on any medications at this time.   Assessment/Plan:   1. Vitamin D insufficiency Low Vitamin D level contributes to fatigue and are associated with obesity, breast, and colon cancer. We will refill prescription Vitamin D 50,000 IU every week for 1 month withno refills and [facility] will follow-up for routine testing of Vitamin D, at least 2-3 times per year to avoid over-replacement.  - Vitamin D, Ergocalciferol, (DRISDOL) 1.25 MG (50000 UNIT) CAPS capsule; Take 1 capsule (50,000 Units total) by mouth every 7 (seven) days.  Dispense: 4 capsule; Refill: 0 - VITAMIN D 25 Hydroxy (Vit-D Deficiency, Fractures)  2. Essential hypertension [name] will continue medications. We will check CMP today. She is working on healthy weight loss and exercise to improve blood pressure control. We will watch for signs of hypotension as she continues her lifestyle modifications.  - Comprehensive metabolic panel  3. Elevated glucose We will check A1C and insulin today.  - Hemoglobin A1c - Insulin,  random  4. Obesity with current BMI of 33.7 [name] is currently in the action stage of change. As such, her goal is to continue with weight loss efforts. She has agreed to the Category 1 Plan, keeping a food journal and adhering to recommended goals of 1000-1100 calories and 70-80 grams of protein, and the Pescatarian Plan.   [name] will continue meal planning and intentional eating.  Exercise goals:  [name] will continue walking and online HITT exercise.  Behavioral modification strategies: increasing lean protein intake, decreasing simple carbohydrates, increasing vegetables, increasing water intake, decreasing eating out, no skipping meals, meal planning and cooking strategies, keeping healthy foods in the home, and planning for success.  [name] has agreed to follow-up with our clinic in 2-3 weeks (fasting). She was informed of the importance of frequent follow-up visits to maximize her success with intensive lifestyle modifications for her multiple health conditions.   [name] was informed we would discuss her lab results at her next visit unless there is a critical issue that needs to be addressed sooner. [name] agreed to keep her next visit at the agreed upon time to discuss these results.  Objective:   Blood pressure 137/82, pulse 63, temperature 97.7 F (36.5 C), height 5\' 2"  (1.575 m), weight 184 lb (83.5 kg), SpO2 99 %. Body mass index is 33.65 kg/m.  General: Cooperative, alert, well developed, in no acute distress. HEENT: Conjunctivae and lids unremarkable. Cardiovascular: Regular rhythm.  Lungs: Normal work of breathing. Neurologic: No focal deficits.   Lab Results  Component Value Date   CREATININE 0.66 02/20/2021   BUN  11 02/20/2021   NA 145 (H) 02/20/2021   K 4.4 02/20/2021   CL 103 02/20/2021   CO2 21 02/20/2021   Lab Results  Component Value Date   ALT 17 02/20/2021   AST 21 02/20/2021   ALKPHOS 93 02/20/2021   BILITOT 0.6 02/20/2021   Lab Results   Component Value Date   HGBA1C 5.5 02/20/2021   HGBA1C 5.4 08/02/2019   HGBA1C 5.3 02/18/2018   HGBA1C 5.5 06/11/2016   HGBA1C 5.5 04/20/2013   Lab Results  Component Value Date   INSULIN 3.5 02/20/2021   INSULIN 3.0 02/18/2018   Lab Results  Component Value Date   TSH 1.660 02/20/2021   Lab Results  Component Value Date   CHOL 161 02/20/2021   HDL 86 02/20/2021   LDLCALC 65 02/20/2021   TRIG 46 02/20/2021   CHOLHDL 2 08/22/2020   Lab Results  Component Value Date   VD25OH 31.0 02/20/2021   VD25OH 43.61 08/22/2020   VD25OH 39.9 02/29/2020   Lab Results  Component Value Date   WBC 4.2 02/20/2021   HGB 13.0 02/20/2021   HCT 39.4 02/20/2021   MCV 93 02/20/2021   PLT 309 02/20/2021   Lab Results  Component Value Date   IRON 120 06/27/2010   FERRITIN 42.7 06/27/2010    Obesity Behavioral Intervention:   Approximately 15 minutes were spent on the discussion below.  ASK: We discussed the diagnosis of obesity with [name] today and [name] agreed to give [place] permission to discuss obesity behavioral modification therapy today.  ASSESS: [name] has the diagnosis of obesity and her BMI today is 33.7. [name] is in the action stage of change.   ADVISE: [name] was educated on the multiple health risks of obesity as well as the benefit of weight loss to improve her health. She was advised of the need for long term treatment and the importance of lifestyle modifications to improve her current health and to decrease her risk of future health problems.  AGREE: Multiple dietary modification options and treatment options were discussed and [name] agreed to follow the recommendations documented in the above note.  ARRANGE: [name] was educated on the importance of frequent visits to treat obesity as outlined per CMS and USPSTF guidelines and agreed to schedule her next follow up appointment today.  Attestation Statements:   Reviewed by clinician on day of visit:  allergies, medications, problem list, medical history, surgical history, family history, social history, and previous encounter notes.   I, Jackson Latino, RMA, am acting as Energy manager for [employer], DO.   I have reviewed the above documentation for accuracy and completeness, and I agree with the above. Corinna Capra, DO

## 2021-06-21 ENCOUNTER — Encounter (INDEPENDENT_AMBULATORY_CARE_PROVIDER_SITE_OTHER): Payer: Self-pay | Admitting: Bariatrics

## 2021-06-21 LAB — COMPREHENSIVE METABOLIC PANEL
ALT: 22 IU/L (ref 0–32)
AST: 24 IU/L (ref 0–40)
Albumin/Globulin Ratio: 1.6 (ref 1.2–2.2)
Albumin: 4.4 g/dL (ref 3.8–4.8)
Alkaline Phosphatase: 93 IU/L (ref 44–121)
BUN/Creatinine Ratio: 24 (ref 12–28)
BUN: 29 mg/dL — ABNORMAL HIGH (ref 8–27)
Bilirubin Total: 0.4 mg/dL (ref 0.0–1.2)
CO2: 26 mmol/L (ref 20–29)
Calcium: 9.5 mg/dL (ref 8.7–10.3)
Chloride: 96 mmol/L (ref 96–106)
Creatinine, Ser: 1.21 mg/dL — ABNORMAL HIGH (ref 0.57–1.00)
Globulin, Total: 2.7 g/dL (ref 1.5–4.5)
Glucose: 128 mg/dL — ABNORMAL HIGH (ref 65–99)
Potassium: 4.4 mmol/L (ref 3.5–5.2)
Sodium: 141 mmol/L (ref 134–144)
Total Protein: 7.1 g/dL (ref 6.0–8.5)
eGFR: 51 mL/min/{1.73_m2} — ABNORMAL LOW (ref >59)

## 2021-06-21 LAB — HEMOGLOBIN A1C
Est. average glucose Bld gHb Est-mCnc: 111 mg/dL
Hgb A1c MFr Bld: 5.5 % (ref 4.8–5.6)

## 2021-06-21 LAB — VITAMIN D 25 HYDROXY (VIT D DEFICIENCY, FRACTURES): Vit D, 25-Hydroxy: 67.5 ng/mL (ref 30.0–100.0)

## 2021-06-21 LAB — INSULIN, RANDOM: INSULIN: 6.5 u[IU]/mL (ref 2.6–24.9)

## 2021-06-25 DIAGNOSIS — G4733 Obstructive sleep apnea (adult) (pediatric): Secondary | ICD-10-CM | POA: Diagnosis not present

## 2021-06-26 ENCOUNTER — Ambulatory Visit (INDEPENDENT_AMBULATORY_CARE_PROVIDER_SITE_OTHER): Payer: Medicare Other | Admitting: Family Medicine

## 2021-06-26 ENCOUNTER — Encounter: Payer: Self-pay | Admitting: Family Medicine

## 2021-06-26 ENCOUNTER — Other Ambulatory Visit: Payer: Self-pay

## 2021-06-26 VITALS — BP 142/100 | HR 77 | Temp 99.0°F | Resp 18 | Ht 62.0 in | Wt 188.6 lb

## 2021-06-26 DIAGNOSIS — R7989 Other specified abnormal findings of blood chemistry: Secondary | ICD-10-CM | POA: Diagnosis not present

## 2021-06-26 DIAGNOSIS — M654 Radial styloid tenosynovitis [de Quervain]: Secondary | ICD-10-CM | POA: Diagnosis not present

## 2021-06-26 DIAGNOSIS — I10 Essential (primary) hypertension: Secondary | ICD-10-CM | POA: Diagnosis not present

## 2021-06-26 LAB — COMPREHENSIVE METABOLIC PANEL
ALT: 20 U/L (ref 0–35)
AST: 22 U/L (ref 0–37)
Albumin: 4.3 g/dL (ref 3.5–5.2)
Alkaline Phosphatase: 80 U/L (ref 39–117)
BUN: 14 mg/dL (ref 6–23)
CO2: 23 mEq/L (ref 19–32)
Calcium: 9.5 mg/dL (ref 8.4–10.5)
Chloride: 104 mEq/L (ref 96–112)
Creatinine, Ser: 0.55 mg/dL (ref 0.40–1.20)
GFR: 97.99 mL/min (ref >60.00)
Glucose, Bld: 100 mg/dL — ABNORMAL HIGH (ref 70–99)
Potassium: 4.7 mEq/L (ref 3.5–5.1)
Sodium: 137 mEq/L (ref 135–145)
Total Bilirubin: 0.6 mg/dL (ref 0.2–1.2)
Total Protein: 7.5 g/dL (ref 6.0–8.3)

## 2021-06-26 MED ORDER — LOSARTAN POTASSIUM 50 MG PO TABS: 50.0000 mg | ORAL_TABLET | Freq: Every day | ORAL | 1 refills | Status: DC

## 2021-06-26 MED ORDER — AMLODIPINE BESYLATE 5 MG PO TABS: 5.0000 mg | ORAL_TABLET | Freq: Every day | ORAL | 1 refills | Status: DC

## 2021-06-26 NOTE — Assessment & Plan Note (Signed)
Recheck labs today. 

## 2021-06-26 NOTE — Patient Instructions (Signed)

## 2021-06-26 NOTE — Assessment & Plan Note (Signed)
Poorly controlled will alter medications, encouraged DASH diet, minimize caffeine and obtain adequate sleep. Report concerning symptoms and follow up as directed and as needed Start norvasc 5 mg daily and f/u 1 month or sooner prn

## 2021-06-26 NOTE — Progress Notes (Signed)
Established Patient Office Visit  Subjective:  Patient ID: [name], female    DOB: [birth year]  Age: 65 y.o. MRN: [id number]  CC:  Chief Complaint  Patient presents with   Labs    Pt states wanting to discuss labs she had done at [facility] presents for f/u labs from [facility]    she is concerned about her kidney function and her bp.   Pt denies any changes in her medication   Past Medical History:  Diagnosis Date   Abdominal pain, other specified site 03/31/2008   Centricity Description: FLANK PAIN, RIGHT Qualifier: Diagnosis of  By: Linna Darner MD, William   Centricity Description: ABDOMINAL PAIN OTHER SPECIFIED SITE Qualifier: Diagnosis of  By: Jerold Coombe     Acute sprain or strain of cervical region 04/10/2012   ALLERGIC RHINITIS 06/26/2007   Qualifier: Diagnosis of  By: Marca Ancona RMA, Lucy     ANKYLOSING SPONDYLITIS 03/31/2008   Qualifier: Diagnosis of  By: Linna Darner MD, William     Ankylosing spondylitis ([place])    neck and spin   Ankylosing spondylitis of multiple sites in spine [facility])    Arthritis    Back pain    Bariatric surgery status 01/14/2008   Qualifier: Diagnosis of  By: Etter Sjogren DO, Kendrick Fries     Blood in stool 12/02/2008   Qualifier: Diagnosis of  By: Jerold Coombe     Blood transfusion without reported diagnosis    Calf pain 10/13/2012   CHEST PAIN UNSPECIFIED 10/11/2009   Qualifier: Diagnosis of  By: Deatra Ina MD, Sandy Salaam    CONSTIPATION 04/06/2010   Qualifier: Diagnosis of  By: Jerold Coombe     CORNEAL ABRASION, LEFT 01/22/2010   Qualifier: Diagnosis of  By: Jerold Coombe     Gastritis    GERD 06/26/2007   Qualifier: Diagnosis of  By: Marca Ancona RMA, Lucy     GERD (gastroesophageal reflux disease)    Hiatal hernia    Hypertension    no medications now   IBS 06/26/2007   Qualifier: Diagnosis of  By: Marca Ancona RMA, Lucy     Joint pain    Lactose intolerance    LEG CRAMPS, NOCTURNAL 11/14/2010   Qualifier: Diagnosis of  By: Heath Lark     Long-term current use of steroids 04/20/2013   LYMPHADENOPATHY 06/10/2007   Qualifier: Diagnosis of  By: Jerold Coombe     MORBID OBESITY 06/26/2007   Qualifier: Diagnosis of  By: Larose Kells     MUSCLE PAIN 03/31/2008   Qualifier: Diagnosis of  By: Linna Darner MD, William     Noninfectious gastroenteritis and colitis 03/17/2013   Obesity (BMI 30-39.9) 03/16/2014   OSA (obstructive sleep apnea) 03/08/2016   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/23/2010   Qualifier: Diagnosis of  By: Jerold Coombe     Rheumatoid arthritis ([place])    Seasonal allergies 05/14/2015   THYROID CYST 06/26/2007   Qualifier: Diagnosis of  By: Marca Ancona RMA, Lucy     THYROID NODULE, HX OF 06/10/2007   Qualifier: Diagnosis of  By: Jerold Coombe     Vitamin D deficiency     Past Surgical History:  Procedure Laterality Date   BARIATRIC SURGERY     BREAST REDUCTION SURGERY  1996   COLONOSCOPY  10/24/2009   kaplan-normal exam   ESOPHAGOGASTRODUODENOSCOPY     pelvis replacement  2010   TOTAL HIP  ARTHROPLASTY     x 2 left, one revision   UPPER GASTROINTESTINAL ENDOSCOPY      Family History  Problem Relation Age of Onset   Hypertension Mother    High Cholesterol Mother    Diabetes Mother    Arthritis Father    Hypertension Father    Diabetes Father    High Cholesterol Father    Melanoma Maternal Grandmother    Breast cancer Maternal Grandmother        and Aunt   Hypertension Sister    Diabetes Sister    Colon cancer Neg Hx    Esophageal cancer Neg Hx    Rectal cancer Neg Hx    Stomach cancer Neg Hx    Colon polyps Neg Hx     Social History   Socioeconomic History   Marital status: Married    Spouse name: Denali Sharma   Number of children: Not on file   Years of education: Not on file   Highest education level: Not on file  Occupational History   Occupation: retired  Tobacco Use   Smoking status: Never   Smokeless tobacco: Never  Vaping Use   Vaping Use: Never used   Substance and Sexual Activity   Alcohol use: No    Alcohol/week: 0.0 standard drinks   Drug use: No   Sexual activity: Yes  Other Topics Concern   Not on file  Social History Narrative   Occupation: Production manager   Daily Caffeine use- 1 cup daily      Social Determinants of Health   Financial Resource Strain: Low Risk    Difficulty of Paying Living Expenses: Not hard at all  Food Insecurity: No Food Insecurity   Worried About Charity fundraiser in the Last Year: Never true   [place] in the Last Year: Never true  Transportation Needs: No Transportation Needs   Lack of Transportation (Medical): No   Lack of Transportation (Non-Medical): No  Physical Activity: Sufficiently Active   Days of Exercise per Week: 3 days   Minutes of Exercise per Session: 50 min  Stress: No Stress Concern Present   Feeling of Stress : Not at all  Social Connections: Moderately Integrated   Frequency of Communication with Friends and Family: More than three times a week   Frequency of Social Gatherings with Friends and Family: More than three times a week   Attends Religious Services: More than 4 times per year   Active Member of [employer] or Organizations: No   Attends Archivist Meetings: Never   Marital Status: Married  Human resources officer Violence: Not At Risk   Fear of Current or Ex-Partner: No   Emotionally Abused: No   Physically Abused: No   Sexually Abused: No    Outpatient Medications Prior to Visit  Medication Sig Dispense Refill   Biotin w/ Vitamins C & E (HAIR SKIN & NAILS GUMMIES PO) Take 1 each by mouth daily.     Calcium Carb-Cholecalciferol (CALTRATE 600+D3 SOFT PO) Take by mouth.     esomeprazole (NEXIUM) 40 MG capsule Take 1 capsule (40 mg total) by mouth daily as needed. Crack open capsule and ingest contents 90 capsule 0   fluticasone (FLONASE) 50 MCG/ACT nasal spray Use 2 spray(s) in each nostril once daily 16 g 0   Phentermine HCl (LOMAIRA) 8 MG TABS Will  take with her lunch daily 30 tablet 0   predniSONE (DELTASONE) 5 MG tablet Take 5 mg by mouth  as needed.     vitamin B-12 (CYANOCOBALAMIN) 1000 MCG tablet Take 1,000 mcg by mouth daily.     Vitamin D, Ergocalciferol, (DRISDOL) 1.25 MG (50000 UNIT) CAPS capsule Take 1 capsule (50,000 Units total) by mouth every 7 (seven) days. 4 capsule 0   No facility-administered medications prior to visit.    Allergies  Allergen Reactions   Aspirin     REACTION: intolerance-gi upset   Codeine Itching   Morphine Itching   Sulfonamide Derivatives     Unsure of reaction    ROS Review of Systems  Constitutional:  Negative for appetite change, diaphoresis, fatigue and unexpected weight change.  Eyes:  Negative for pain, redness and visual disturbance.  Respiratory:  Negative for cough, chest tightness, shortness of breath and wheezing.   Cardiovascular:  Negative for chest pain, palpitations and leg swelling.  Endocrine: Negative for cold intolerance, heat intolerance, polydipsia, polyphagia and polyuria.  Genitourinary:  Negative for difficulty urinating, dysuria and frequency.  Neurological:  Negative for dizziness, light-headedness, numbness and headaches.     Objective:    Physical Exam Vitals and nursing note reviewed.  Constitutional:      Appearance: She is well-developed.  HENT:     Head: Normocephalic and atraumatic.  Eyes:     Conjunctiva/sclera: Conjunctivae normal.  Neck:     Thyroid: No thyromegaly.     Vascular: No carotid bruit or JVD.  Cardiovascular:     Rate and Rhythm: Normal rate and regular rhythm.     Heart sounds: Normal heart sounds. No murmur heard. Pulmonary:     Effort: Pulmonary effort is normal. No respiratory distress.     Breath sounds: Normal breath sounds. No wheezing or rales.  Chest:     Chest wall: No tenderness.  Musculoskeletal:     Cervical back: Normal range of motion and neck supple.  Neurological:     Mental Status: She is alert and oriented  to person, place, and time.    BP (!) 142/100 (BP Location: Left Arm, Patient Position: Sitting, Cuff Size: Normal)   Pulse 77   Temp 99 F (37.2 C) (Oral)   Resp 18   Ht 5' 2"  (1.575 m)   Wt 188 lb 9.6 oz (85.5 kg)   SpO2 98%   BMI 34.50 kg/m  Wt Readings from Last 3 Encounters:  06/26/21 188 lb 9.6 oz (85.5 kg)  06/20/21 184 lb (83.5 kg)  05/22/21 183 lb (83 kg)     Health Maintenance Due  Topic Date Due   Zoster Vaccines- Shingrix (1 of 2) Never done   COVID-19 Vaccine (4 - Booster for Pfizer series) 01/13/2021   INFLUENZA VACCINE  04/30/2021    There are no preventive care reminders to display for this patient.  Lab Results  Component Value Date   TSH 1.660 02/20/2021   Lab Results  Component Value Date   WBC 4.2 02/20/2021   HGB 13.0 02/20/2021   HCT 39.4 02/20/2021   MCV 93 02/20/2021   PLT 309 02/20/2021   Lab Results  Component Value Date   NA 141 06/20/2021   K 4.4 06/20/2021   CO2 26 06/20/2021   GLUCOSE 128 (H) 06/20/2021   BUN 29 (H) 06/20/2021   CREATININE 1.21 (H) 06/20/2021   BILITOT 0.4 06/20/2021   ALKPHOS 93 06/20/2021   AST 24 06/20/2021   ALT 22 06/20/2021   PROT 7.1 06/20/2021   ALBUMIN 4.4 06/20/2021   CALCIUM 9.5 06/20/2021   ANIONGAP 10 04/06/2020  EGFR 51 (L) 06/20/2021   GFR 88.44 08/22/2020   Lab Results  Component Value Date   CHOL 161 02/20/2021   Lab Results  Component Value Date   HDL 86 02/20/2021   Lab Results  Component Value Date   LDLCALC 65 02/20/2021   Lab Results  Component Value Date   TRIG 46 02/20/2021   Lab Results  Component Value Date   CHOLHDL 2 08/22/2020   Lab Results  Component Value Date   HGBA1C 5.5 06/20/2021      Assessment & Plan:   Problem List Items Addressed This Visit       Unprioritized   Elevated serum creatinine    Recheck labs today       Relevant Orders   Comprehensive metabolic panel   Primary hypertension - Primary    Poorly controlled will alter  medications, encouraged DASH diet, minimize caffeine and obtain adequate sleep. Report concerning symptoms and follow up as directed and as needed Start norvasc 5 mg daily and f/u 1 month or sooner prn       Relevant Medications   amLODipine (NORVASC) 5 MG tablet    Meds ordered this encounter  Medications   DISCONTD: losartan (COZAAR) 50 MG tablet    Sig: Take 1 tablet (50 mg total) by mouth daily.    Dispense:  30 tablet    Refill:  1   amLODipine (NORVASC) 5 MG tablet    Sig: Take 1 tablet (5 mg total) by mouth daily.    Dispense:  30 tablet    Refill:  1    D/c losartan    Follow-up: Return in about 2 weeks (around 07/10/2021), or if symptoms worsen or fail to improve, for hypertension.    Ann Held, DO

## 2021-06-28 DIAGNOSIS — L669 Cicatricial alopecia, unspecified: Secondary | ICD-10-CM | POA: Diagnosis not present

## 2021-06-28 DIAGNOSIS — L658 Other specified nonscarring hair loss: Secondary | ICD-10-CM | POA: Diagnosis not present

## 2021-07-11 ENCOUNTER — Encounter (INDEPENDENT_AMBULATORY_CARE_PROVIDER_SITE_OTHER): Payer: Self-pay | Admitting: Bariatrics

## 2021-07-11 ENCOUNTER — Ambulatory Visit (INDEPENDENT_AMBULATORY_CARE_PROVIDER_SITE_OTHER): Payer: Medicare Other | Admitting: Bariatrics

## 2021-07-11 ENCOUNTER — Other Ambulatory Visit: Payer: Self-pay

## 2021-07-11 VITALS — BP 126/78 | HR 69 | Temp 98.8°F | Ht 62.0 in | Wt 184.0 lb

## 2021-07-11 DIAGNOSIS — E559 Vitamin D deficiency, unspecified: Secondary | ICD-10-CM

## 2021-07-11 DIAGNOSIS — Z6835 Body mass index (BMI) 35.0-35.9, adult: Secondary | ICD-10-CM

## 2021-07-11 DIAGNOSIS — I10 Essential (primary) hypertension: Secondary | ICD-10-CM

## 2021-07-11 MED ORDER — VITAMIN D (ERGOCALCIFEROL) 1.25 MG (50000 UNIT) PO CAPS
50000.0000 [IU] | ORAL_CAPSULE | ORAL | 0 refills | Status: DC
Start: 2021-07-11 — End: 2021-08-14

## 2021-07-11 NOTE — Progress Notes (Signed)
Chief Complaint:   OBESITY [name] is here to discuss her progress with her obesity treatment plan along with follow-up of her obesity related diagnoses. [name] is on keeping a food journal and adhering to recommended goals of [phone] calories and 70-80 grams of protein and the Pescatarian Plan and states she is following her eating plan approximately 70% of the time. [name] states she is counting steps 6,000  7 times per week.  Today's visit was #: 7 Starting weight: 191 lbs Starting date: 02/20/2021 Today's weight: 184 lbs Today's date: 07/11/2021 Total lbs lost to date: 7 lbs Total lbs lost since last in-office visit: 0  Interim History: [name] weights remains the same as her previous visit. She is getting adequate water in.  Subjective:   1. Vitamin D insufficiency [name] is taking Vitamin D currently.   2. Primary hypertension [name] was recently put on blood pressure medications. Her blood pressure is controlled.  Assessment/Plan:   1. Vitamin D insufficiency Low Vitamin D level contributes to fatigue and are associated with obesity, breast, and colon cancer.  We will refill prescription Vitamin D 50,000 IU every week for 1 month with no refills. She will then start Vitamin D 2000 IU. [name] will follow-up for routine testing of Vitamin D, at least 2-3 times per year to avoid over-replacement.  - Vitamin D, Ergocalciferol, (DRISDOL) 1.25 MG (50000 UNIT) CAPS capsule; Take 1 capsule (50,000 Units total) by mouth every 7 (seven) days.  Dispense: 4 capsule; Refill: 0  2. Primary hypertension [name] will continue her medications. She is working on healthy weight loss and exercise to improve blood pressure control. We will watch for signs of hypotension as she continues her lifestyle modifications.  3. Obesity with current BMI of 33.7 [name] is currently in the action stage of change. As such, her goal is to continue with weight loss efforts. She has agreed to  keeping a food journal and adhering to recommended goals of [phone] calories and 70-80 grams of protein.   [name] will continue meal planning and intentional eating. We will review labs today. She is back on track.  Exercise goals:  [name] will increase exercise 3-4 days per week.  Behavioral modification strategies: increasing lean protein intake, decreasing simple carbohydrates, increasing vegetables, increasing water intake, decreasing eating out, no skipping meals, meal planning and cooking strategies, keeping healthy foods in the home, and planning for success.  [name] has agreed to follow-up with our clinic in 3 weeks. She was informed of the importance of frequent follow-up visits to maximize her success with intensive lifestyle modifications for her multiple health conditions.   Objective:   Blood pressure 126/78, pulse 69, temperature 98.8 F (37.1 C), height 5\' 2"  (1.575 m), weight 184 lb (83.5 kg), SpO2 97 %. Body mass index is 33.65 kg/m.  General: Cooperative, alert, well developed, in no acute distress. HEENT: Conjunctivae and lids unremarkable. Cardiovascular: Regular rhythm.  Lungs: Normal work of breathing. Neurologic: No focal deficits.   Lab Results  Component Value Date   CREATININE 0.55 06/26/2021   BUN 14 06/26/2021   NA 137 06/26/2021   K 4.7 06/26/2021   CL 104 06/26/2021   CO2 23 06/26/2021   Lab Results  Component Value Date   ALT 20 06/26/2021   AST 22 06/26/2021   ALKPHOS 80 06/26/2021   BILITOT 0.6 06/26/2021   Lab Results  Component Value Date   HGBA1C 5.5 06/20/2021   HGBA1C 5.5 02/20/2021   HGBA1C 5.4 08/02/2019  HGBA1C 5.3 02/18/2018   HGBA1C 5.5 06/11/2016   Lab Results  Component Value Date   INSULIN 6.5 06/20/2021   INSULIN 3.5 02/20/2021   INSULIN 3.0 02/18/2018   Lab Results  Component Value Date   TSH 1.660 02/20/2021   Lab Results  Component Value Date   CHOL 161 02/20/2021   HDL 86 02/20/2021   LDLCALC 65  02/20/2021   TRIG 46 02/20/2021   CHOLHDL 2 08/22/2020   Lab Results  Component Value Date   VD25OH 67.5 06/20/2021   VD25OH 31.0 02/20/2021   VD25OH 43.61 08/22/2020   Lab Results  Component Value Date   WBC 4.2 02/20/2021   HGB 13.0 02/20/2021   HCT 39.4 02/20/2021   MCV 93 02/20/2021   PLT 309 02/20/2021   Lab Results  Component Value Date   IRON 120 06/27/2010   FERRITIN 42.7 06/27/2010   Attestation Statements:   Reviewed by clinician on day of visit: allergies, medications, problem list, medical history, surgical history, family history, social history, and previous encounter notes.  I, Jackson Latino, RMA, am acting as Energy manager for [employer], DO.   I have reviewed the above documentation for accuracy and completeness, and I agree with the above. Corinna Capra, DO

## 2021-07-12 ENCOUNTER — Encounter (INDEPENDENT_AMBULATORY_CARE_PROVIDER_SITE_OTHER): Payer: Self-pay | Admitting: Bariatrics

## 2021-07-12 ENCOUNTER — Ambulatory Visit (INDEPENDENT_AMBULATORY_CARE_PROVIDER_SITE_OTHER): Payer: Medicare Other | Admitting: Family Medicine

## 2021-07-12 ENCOUNTER — Encounter: Payer: Self-pay | Admitting: Family Medicine

## 2021-07-12 VITALS — BP 140/86 | HR 77 | Temp 98.1°F | Resp 18 | Ht 62.0 in | Wt 188.2 lb

## 2021-07-12 DIAGNOSIS — I10 Essential (primary) hypertension: Secondary | ICD-10-CM | POA: Diagnosis not present

## 2021-07-12 MED ORDER — HYDROCHLOROTHIAZIDE 25 MG PO TABS: 25.0000 mg | ORAL_TABLET | Freq: Every day | ORAL | 3 refills | Status: DC

## 2021-07-12 NOTE — Progress Notes (Addendum)
Subjective:   By signing my name below, I, Shehryar Baig, attest that this documentation has been prepared under the direction and in the presence of Dr. Roma Schanz, DO. 07/12/2021    Patient ID: [name], female    DOB: August 30, 1956, 65 y.o.   MRN: [id number]  Chief Complaint  Patient presents with   Hypertension    Pt states checking bps at home. Last 3 were 138/83, 138/84, 136/86.    Follow-up    HPI Patient is in today for a office visit.   Her blood pressure is elevated during this visit. She measures her blood pressure at home and reports they range from 349-179 systolic and 15-05 diastolic. She is on a low sodium diet to manage her blood pressure. She also reports drinking around 7 glasses of water daily. She continues taking miralax daily to manage her constipation. She continues taking 5 mg amlodipine daily PO and reports no new issues while taking it.   BP Readings from Last 3 Encounters:  07/12/21 140/86  07/11/21 126/78  06/26/21 (!) 142/100   Pulse Readings from Last 3 Encounters:  07/12/21 77  07/11/21 69  06/26/21 77   She is not interested in getting the flu vaccine during this visit. She is getting the bivalent Covid-19 vaccine at her pharmacy at a later date.    Past Medical History:  Diagnosis Date   Abdominal pain, other specified site 03/31/2008   Centricity Description: FLANK PAIN, RIGHT Qualifier: Diagnosis of  By: Linna Darner MD, Gwyndolyn Saxon   Centricity Description: ABDOMINAL PAIN OTHER SPECIFIED SITE Qualifier: Diagnosis of  By: Jerold Coombe     Acute sprain or strain of cervical region 04/10/2012   ALLERGIC RHINITIS 06/26/2007   Qualifier: Diagnosis of  By: Marca Ancona RMA, Lucy     ANKYLOSING SPONDYLITIS 03/31/2008   Qualifier: Diagnosis of  By: Linna Darner MD, Gwyndolyn Saxon     Ankylosing spondylitis ([place])    neck and spin   Ankylosing spondylitis of multiple sites in spine [facility])    Arthritis    Back pain    Bariatric surgery status 01/14/2008    Qualifier: Diagnosis of  By: Jerold Coombe     Blood in stool 12/02/2008   Qualifier: Diagnosis of  By: Jerold Coombe     Blood transfusion without reported diagnosis    Calf pain 10/13/2012   CHEST PAIN UNSPECIFIED 10/11/2009   Qualifier: Diagnosis of  By: Deatra Ina MD, Sandy Salaam    CONSTIPATION 04/06/2010   Qualifier: Diagnosis of  By: Jerold Coombe     CORNEAL ABRASION, LEFT 01/22/2010   Qualifier: Diagnosis of  By: Jerold Coombe     Gastritis    GERD 06/26/2007   Qualifier: Diagnosis of  By: Marca Ancona RMA, Lucy     GERD (gastroesophageal reflux disease)    Hiatal hernia    Hypertension    no medications now   IBS 06/26/2007   Qualifier: Diagnosis of  By: Marca Ancona RMA, Lucy     Joint pain    Lactose intolerance    LEG CRAMPS, NOCTURNAL 11/14/2010   Qualifier: Diagnosis of  By: Heath Lark     Long-term current use of steroids 04/20/2013   LYMPHADENOPATHY 06/10/2007   Qualifier: Diagnosis of  By: Jerold Coombe     MORBID OBESITY 06/26/2007   Qualifier: Diagnosis of  By: Larose Kells     MUSCLE PAIN 03/31/2008   Qualifier: Diagnosis of  By: Linna Darner MD, Gwyndolyn Saxon  Noninfectious gastroenteritis and colitis 03/17/2013   Obesity (BMI 30-39.9) 03/16/2014   OSA (obstructive sleep apnea) 03/08/2016   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/23/2010   Qualifier: Diagnosis of  By: Jerold Coombe     Rheumatoid arthritis ([place])    Seasonal allergies 05/14/2015   THYROID CYST 06/26/2007   Qualifier: Diagnosis of  By: Marca Ancona RMA, Lucy     THYROID NODULE, HX OF 06/10/2007   Qualifier: Diagnosis of  By: Jerold Coombe     Vitamin D deficiency     Past Surgical History:  Procedure Laterality Date   BARIATRIC SURGERY     BREAST REDUCTION SURGERY  1996   COLONOSCOPY  10/24/2009   kaplan-normal exam   ESOPHAGOGASTRODUODENOSCOPY     pelvis replacement  2010   TOTAL HIP ARTHROPLASTY     x 2 left, one revision   UPPER GASTROINTESTINAL ENDOSCOPY      Family History  Problem Relation  Age of Onset   Hypertension Mother    High Cholesterol Mother    Diabetes Mother    Arthritis Father    Hypertension Father    Diabetes Father    High Cholesterol Father    Melanoma Maternal Grandmother    Breast cancer Maternal Grandmother        and Aunt   Hypertension Sister    Diabetes Sister    Colon cancer Neg Hx    Esophageal cancer Neg Hx    Rectal cancer Neg Hx    Stomach cancer Neg Hx    Colon polyps Neg Hx     Social History   Socioeconomic History   Marital status: Married    Spouse name: Aamya Orellana   Number of children: Not on file   Years of education: Not on file   Highest education level: Not on file  Occupational History   Occupation: retired  Tobacco Use   Smoking status: Never   Smokeless tobacco: Never  Vaping Use   Vaping Use: Never used  Substance and Sexual Activity   Alcohol use: No    Alcohol/week: 0.0 standard drinks   Drug use: No   Sexual activity: Yes  Other Topics Concern   Not on file  Social History Narrative   Occupation: Production manager   Daily Caffeine use- 1 cup daily      Social Determinants of Health   Financial Resource Strain: Low Risk    Difficulty of Paying Living Expenses: Not hard at all  Food Insecurity: No Food Insecurity   Worried About Charity fundraiser in the Last Year: Never true   Arboriculturist in the Last Year: Never true  Transportation Needs: No Transportation Needs   Lack of Transportation (Medical): No   Lack of Transportation (Non-Medical): No  Physical Activity: Sufficiently Active   Days of Exercise per Week: 3 days   Minutes of Exercise per Session: 50 min  Stress: No Stress Concern Present   Feeling of Stress : Not at all  Social Connections: Moderately Integrated   Frequency of Communication with Friends and Family: More than three times a week   Frequency of Social Gatherings with Friends and Family: More than three times a week   Attends Religious Services: More than 4 times per  year   Active Member of [employer] or Organizations: No   Attends Archivist Meetings: Never   Marital Status: Married  Human resources officer Violence: Not At Risk   Fear of Current or  Ex-Partner: No   Emotionally Abused: No   Physically Abused: No   Sexually Abused: No    Outpatient Medications Prior to Visit  Medication Sig Dispense Refill   amLODipine (NORVASC) 5 MG tablet Take 1 tablet (5 mg total) by mouth daily. 30 tablet 1   Biotin w/ Vitamins C & E (HAIR SKIN & NAILS GUMMIES PO) Take 1 each by mouth daily.     Calcium Carb-Cholecalciferol (CALTRATE 600+D3 SOFT PO) Take by mouth.     esomeprazole (NEXIUM) 40 MG capsule Take 1 capsule (40 mg total) by mouth daily as needed. Crack open capsule and ingest contents 90 capsule 0   fluticasone (FLONASE) 50 MCG/ACT nasal spray Use 2 spray(s) in each nostril once daily 16 g 0   Phentermine HCl (LOMAIRA) 8 MG TABS Will take with her lunch daily 30 tablet 0   vitamin B-12 (CYANOCOBALAMIN) 1000 MCG tablet Take 1,000 mcg by mouth daily.     Vitamin D, Ergocalciferol, (DRISDOL) 1.25 MG (50000 UNIT) CAPS capsule Take 1 capsule (50,000 Units total) by mouth every 7 (seven) days. 4 capsule 0   predniSONE (DELTASONE) 5 MG tablet Take 5 mg by mouth as needed. (Patient not taking: Reported on 07/12/2021)     No facility-administered medications prior to visit.    Allergies  Allergen Reactions   Aspirin     REACTION: intolerance-gi upset   Codeine Itching   Morphine Itching   Sulfonamide Derivatives     Unsure of reaction    Review of Systems  Constitutional:  Negative for chills, fever and malaise/fatigue.  HENT:  Negative for congestion and hearing loss.   Eyes:  Negative for discharge.  Respiratory:  Negative for cough, sputum production and shortness of breath.   Cardiovascular:  Negative for chest pain, palpitations and leg swelling.  Gastrointestinal:  Negative for abdominal pain, blood in stool, constipation, diarrhea, heartburn,  nausea and vomiting.  Genitourinary:  Negative for dysuria, frequency, hematuria and urgency.  Musculoskeletal:  Negative for back pain, falls and myalgias.  Skin:  Negative for rash.  Neurological:  Negative for dizziness, sensory change, loss of consciousness, weakness and headaches.  Endo/Heme/Allergies:  Negative for environmental allergies. Does not bruise/bleed easily.  Psychiatric/Behavioral:  Negative for depression and suicidal ideas. The patient is not nervous/anxious and does not have insomnia.       Objective:    Physical Exam Vitals and nursing note reviewed.  Constitutional:      General: She is not in acute distress.    Appearance: Normal appearance. She is well-developed. She is not ill-appearing.  HENT:     Head: Normocephalic and atraumatic.     Right Ear: External ear normal.     Left Ear: External ear normal.  Eyes:     Extraocular Movements: Extraocular movements intact.     Conjunctiva/sclera: Conjunctivae normal.     Pupils: Pupils are equal, round, and reactive to light.  Neck:     Thyroid: No thyromegaly.     Vascular: No carotid bruit or JVD.  Cardiovascular:     Rate and Rhythm: Normal rate and regular rhythm.     Heart sounds: Normal heart sounds. No murmur heard.   No gallop.     Comments: Her blood pressure measured 140/86 during the PE.  Pulmonary:     Effort: Pulmonary effort is normal. No respiratory distress.     Breath sounds: Normal breath sounds. No wheezing or rales.  Chest:     Chest wall: No tenderness.  Musculoskeletal:     Cervical back: Normal range of motion and neck supple.  Skin:    General: Skin is warm and dry.  Neurological:     Mental Status: She is alert and oriented to person, place, and time.  Psychiatric:        Behavior: Behavior normal.        Judgment: Judgment normal.    BP 140/86 (BP Location: Right Arm, Patient Position: Sitting, Cuff Size: Large)   Pulse 77   Temp 98.1 F (36.7 C) (Oral)   Resp 18   Ht  5' 2"  (1.575 m)   Wt 188 lb 3.2 oz (85.4 kg)   SpO2 99%   BMI 34.42 kg/m  Wt Readings from Last 3 Encounters:  07/12/21 188 lb 3.2 oz (85.4 kg)  07/11/21 184 lb (83.5 kg)  06/26/21 188 lb 9.6 oz (85.5 kg)    Diabetic Foot Exam - Simple   No data filed    Lab Results  Component Value Date   WBC 4.2 02/20/2021   HGB 13.0 02/20/2021   HCT 39.4 02/20/2021   PLT 309 02/20/2021   GLUCOSE 100 (H) 06/26/2021   CHOL 161 02/20/2021   TRIG 46 02/20/2021   HDL 86 02/20/2021   LDLCALC 65 02/20/2021   ALT 20 06/26/2021   AST 22 06/26/2021   NA 137 06/26/2021   K 4.7 06/26/2021   CL 104 06/26/2021   CREATININE 0.55 06/26/2021   BUN 14 06/26/2021   CO2 23 06/26/2021   TSH 1.660 02/20/2021   INR 1.2 09/26/2008   HGBA1C 5.5 06/20/2021    Lab Results  Component Value Date   TSH 1.660 02/20/2021   Lab Results  Component Value Date   WBC 4.2 02/20/2021   HGB 13.0 02/20/2021   HCT 39.4 02/20/2021   MCV 93 02/20/2021   PLT 309 02/20/2021   Lab Results  Component Value Date   NA 137 06/26/2021   K 4.7 06/26/2021   CO2 23 06/26/2021   GLUCOSE 100 (H) 06/26/2021   BUN 14 06/26/2021   CREATININE 0.55 06/26/2021   BILITOT 0.6 06/26/2021   ALKPHOS 80 06/26/2021   AST 22 06/26/2021   ALT 20 06/26/2021   PROT 7.5 06/26/2021   ALBUMIN 4.3 06/26/2021   CALCIUM 9.5 06/26/2021   ANIONGAP 10 04/06/2020   EGFR 51 (L) 06/20/2021   GFR 97.99 06/26/2021   Lab Results  Component Value Date   CHOL 161 02/20/2021   Lab Results  Component Value Date   HDL 86 02/20/2021   Lab Results  Component Value Date   LDLCALC 65 02/20/2021   Lab Results  Component Value Date   TRIG 46 02/20/2021   Lab Results  Component Value Date   CHOLHDL 2 08/22/2020   Lab Results  Component Value Date   HGBA1C 5.5 06/20/2021       Assessment & Plan:   Problem List Items Addressed This Visit       Unprioritized   Primary hypertension - Primary    Poorly controlled will alter  medications, encouraged DASH diet, minimize caffeine and obtain adequate sleep. Report concerning symptoms and follow up as directed and as needed      Relevant Medications   hydrochlorothiazide (HYDRODIURIL) 25 MG tablet     Meds ordered this encounter  Medications   hydrochlorothiazide (HYDRODIURIL) 25 MG tablet    Sig: Take 1 tablet (25 mg total) by mouth daily.    Dispense:  90 tablet  Refill:  3    I, Dr. Roma Schanz, DO, personally preformed the services described in this documentation.  All medical record entries made by the scribe were at my direction and in my presence.  I have reviewed the chart and discharge instructions (if applicable) and agree that the record reflects my personal performance and is accurate and complete. 07/12/2021   I,Shehryar Baig,acting as a scribe for Ann Held, DO.,have documented all relevant documentation on the behalf of Ann Held, DO,as directed by  Ann Held, DO while in the presence of Ann Held, DO.   Ann Held, DO

## 2021-07-12 NOTE — Assessment & Plan Note (Addendum)
Poorly controlled will alter medications, encouraged DASH diet, minimize caffeine and obtain adequate sleep. Report concerning symptoms and follow up as directed and as needed con't norvasc and add hctz

## 2021-07-12 NOTE — Patient Instructions (Signed)

## 2021-07-18 ENCOUNTER — Telehealth: Payer: Self-pay | Admitting: Family Medicine

## 2021-07-18 NOTE — Telephone Encounter (Signed)
Pt stated she started feeling exhausted, wiped out, and unable to do daily activities when taking the new med(hydrochlorothiazide (HYDRODIURIL) 25 MG tablet ). Pt would like suggestions such as cutting the pill in half or other medication. Please advise.

## 2021-07-19 NOTE — Telephone Encounter (Signed)
Patient notified about cutting in half.    Patient wanted to know if it is ok to take the hctz with prednisone,  she only uses when needed fro her back.  She uses about 5-10mg  for about 3-4 days when she uses the prednisone.  Can you help with this since PCP is out of office.

## 2021-07-20 NOTE — Telephone Encounter (Signed)
Patient notified

## 2021-07-22 ENCOUNTER — Encounter: Payer: Self-pay | Admitting: Internal Medicine

## 2021-07-22 NOTE — Assessment & Plan Note (Signed)
Benefits from CPAP Plan- continue auto 4-10. Consider HST if continues to lose weight.

## 2021-07-22 NOTE — Assessment & Plan Note (Signed)
Making progress working on weight.

## 2021-07-24 ENCOUNTER — Telehealth: Payer: Self-pay | Admitting: Family Medicine

## 2021-07-24 NOTE — Telephone Encounter (Signed)
Pt stated she is having tightness in chest/back. Pt checks bp in the mornings and it is reflecting around 130/85. Pt does not like the feeling of medication and would like to stop, pt would just like to know the best way to wing off. Please advise.

## 2021-07-25 ENCOUNTER — Encounter (HOSPITAL_BASED_OUTPATIENT_CLINIC_OR_DEPARTMENT_OTHER): Payer: Self-pay | Admitting: Emergency Medicine

## 2021-07-25 ENCOUNTER — Other Ambulatory Visit: Payer: Self-pay

## 2021-07-25 ENCOUNTER — Telehealth: Payer: Self-pay | Admitting: Family Medicine

## 2021-07-25 ENCOUNTER — Emergency Department (HOSPITAL_BASED_OUTPATIENT_CLINIC_OR_DEPARTMENT_OTHER): Payer: Medicare Other

## 2021-07-25 ENCOUNTER — Emergency Department (HOSPITAL_BASED_OUTPATIENT_CLINIC_OR_DEPARTMENT_OTHER)
Admission: EM | Admit: 2021-07-25 | Discharge: 2021-07-25 | Disposition: A | Discharge status: Home / Self Care | Payer: Medicare Other | Attending: Emergency Medicine | Admitting: Emergency Medicine

## 2021-07-25 ENCOUNTER — Encounter: Payer: Self-pay | Admitting: Family Medicine

## 2021-07-25 DIAGNOSIS — Z96642 Presence of left artificial hip joint: Secondary | ICD-10-CM | POA: Insufficient documentation

## 2021-07-25 DIAGNOSIS — R079 Chest pain, unspecified: Secondary | ICD-10-CM | POA: Diagnosis not present

## 2021-07-25 DIAGNOSIS — I10 Essential (primary) hypertension: Secondary | ICD-10-CM | POA: Diagnosis not present

## 2021-07-25 DIAGNOSIS — K449 Diaphragmatic hernia without obstruction or gangrene: Secondary | ICD-10-CM | POA: Diagnosis not present

## 2021-07-25 DIAGNOSIS — Z8616 Personal history of COVID-19: Secondary | ICD-10-CM | POA: Insufficient documentation

## 2021-07-25 DIAGNOSIS — R0789 Other chest pain: Secondary | ICD-10-CM | POA: Diagnosis not present

## 2021-07-25 DIAGNOSIS — M546 Pain in thoracic spine: Secondary | ICD-10-CM | POA: Insufficient documentation

## 2021-07-25 DIAGNOSIS — Z79899 Other long term (current) drug therapy: Secondary | ICD-10-CM | POA: Insufficient documentation

## 2021-07-25 DIAGNOSIS — Z7952 Long term (current) use of systemic steroids: Secondary | ICD-10-CM | POA: Diagnosis not present

## 2021-07-25 LAB — CBC
HCT: 41.1 % (ref 36.0–46.0)
Hemoglobin: 13.8 g/dL (ref 12.0–15.0)
MCH: 32.5 pg (ref 26.0–34.0)
MCHC: 33.6 g/dL (ref 30.0–36.0)
MCV: 96.7 fL (ref 80.0–100.0)
Platelets: 342 10*3/uL (ref 150–400)
RBC: 4.25 MIL/uL (ref 3.87–5.11)
RDW: 12.5 % (ref 11.5–15.5)
WBC: 6.4 10*3/uL (ref 4.0–10.5)
nRBC: 0 % (ref 0.0–0.2)

## 2021-07-25 LAB — BASIC METABOLIC PANEL
Anion gap: 9 (ref 5–15)
BUN: 16 mg/dL (ref 8–23)
CO2: 30 mmol/L (ref 22–32)
Calcium: 9.4 mg/dL (ref 8.9–10.3)
Chloride: 100 mmol/L (ref 98–111)
Creatinine, Ser: 0.68 mg/dL (ref 0.44–1.00)
GFR, Estimated: >60 mL/min (ref >60)
Glucose, Bld: 92 mg/dL (ref 70–99)
Potassium: 3.4 mmol/L — ABNORMAL LOW (ref 3.5–5.1)
Sodium: 139 mmol/L (ref 135–145)

## 2021-07-25 LAB — TROPONIN I (HIGH SENSITIVITY)
Troponin I (High Sensitivity): 5 ng/L (ref <18)
Troponin I (High Sensitivity): 6 ng/L (ref <18)

## 2021-07-25 LAB — HEPATIC FUNCTION PANEL
ALT: 24 U/L (ref 0–44)
AST: 28 U/L (ref 15–41)
Albumin: 3.8 g/dL (ref 3.5–5.0)
Alkaline Phosphatase: 71 U/L (ref 38–126)
Bilirubin, Direct: 0.1 mg/dL (ref 0.0–0.2)
Indirect Bilirubin: 0.5 mg/dL (ref 0.3–0.9)
Total Bilirubin: 0.6 mg/dL (ref 0.3–1.2)
Total Protein: 7.6 g/dL (ref 6.5–8.1)

## 2021-07-25 MED ORDER — SODIUM CHLORIDE 0.9 % IV BOLUS: 1000.0000 mL | Freq: Once | INTRAVENOUS | Status: DC

## 2021-07-25 MED ORDER — IOHEXOL 350 MG/ML SOLN
100.0000 mL | Freq: Once | INTRAVENOUS | Status: AC | PRN
  Administered 2021-07-25: 100 mL via INTRAVENOUS

## 2021-07-25 NOTE — ED Triage Notes (Addendum)
Left sided intermittent chest pain radiating to shoulder and back.  No sob, no diaphoresis, no N/V, no dizziness. Pt took prednisone thinking she was having a flare of AS but it did not help.

## 2021-07-25 NOTE — Discharge Instructions (Signed)
Your laboratory results were within normal limits today.  We discussed the findings of your CT angio at length.  I recommend that you follow-up with primary care physician as needed.  If you experience any worsening symptoms, shortness of breath you will need to return to the emergency department.

## 2021-07-25 NOTE — Telephone Encounter (Signed)
Pt currently at the ED 

## 2021-07-25 NOTE — ED Provider Notes (Signed)
[facility] EMERGENCY DEPARTMENT Provider Note   CSN: [id number] Arrival date & time: 07/25/21  1149     History Chief Complaint  Patient presents with   Chest Pain    [name] is a 65 y.o. female.  65 year old female with a past medical history of ankylosing spondylitis, arthritis, hypertension presents to the ED with a chief complaint of intermittent left-sided chest pain that is been ongoing for the past week.  This is described as a cramping sensation which originates at the left side of her chest and radiates to her upper back.  No exacerbating factors.  She does have a prior history of acid reflux, does report taking Nexium so she feels that the symptoms are not likely happening due to her acid reflux.  She has been taken arthritis extra strength Tylenol, Motrin without any improvement in her symptoms.  In addition, she does report her husband noted her to be more winded in the last couple of days.  She also reports being started on HCTZ approximately 2 weeks ago, feels that this caused her to be more fatigued, she has since decreased the dose to half.  She denies any shortness of breath, sick contacts, abdominal other complaints.  No prior history of CAD, no family history of CAD, denies any history of smoking or prior blood clots.    The history is provided by the patient.  Chest Pain Pain location:  Substernal area Pain quality: tightness   Pain radiates to:  Upper back Pain severity:  Moderate Onset quality:  Gradual Duration:  1 week Timing:  Intermittent Progression:  Waxing and waning Chronicity:  New Context: not breathing and not movement   Relieved by:  Nothing Associated symptoms: back pain (upper back)   Associated symptoms: no abdominal pain, no dizziness, no fever, no headache, no nausea, no shortness of breath and no vomiting   Risk factors: hypertension   Risk factors: no coronary artery disease, no diabetes mellitus, no high cholesterol,  not female, no prior DVT/PE and no smoking       Past Medical History:  Diagnosis Date   Abdominal pain, other specified site 03/31/2008   Centricity Description: FLANK PAIN, RIGHT Qualifier: Diagnosis of  By: Alwyn Ren MD, William   Centricity Description: ABDOMINAL PAIN OTHER SPECIFIED SITE Qualifier: Diagnosis of  By: Janit Bern     Acute sprain or strain of cervical region 04/10/2012   ALLERGIC RHINITIS 06/26/2007   Qualifier: Diagnosis of  By: Charlsie Quest RMA, Lucy     ANKYLOSING SPONDYLITIS 03/31/2008   Qualifier: Diagnosis of  By: Alwyn Ren MD, William     Ankylosing spondylitis (HCC)    neck and spin   Ankylosing spondylitis of multiple sites in spine [facility])    Arthritis    Back pain    Bariatric surgery status 01/14/2008   Qualifier: Diagnosis of  By: Janit Bern     Blood in stool 12/02/2008   Qualifier: Diagnosis of  By: Janit Bern     Blood transfusion without reported diagnosis    Calf pain 10/13/2012   CHEST PAIN UNSPECIFIED 10/11/2009   Qualifier: Diagnosis of  By: Arlyce Dice MD, Barbette Hair    CONSTIPATION 04/06/2010   Qualifier: Diagnosis of  By: Janit Bern     CORNEAL ABRASION, LEFT 01/22/2010   Qualifier: Diagnosis of  By: Janit Bern     Gastritis    GERD 06/26/2007   Qualifier: Diagnosis of  By: Samara Snide  GERD (gastroesophageal reflux disease)    Hiatal hernia    Hypertension    no medications now   IBS 06/26/2007   Qualifier: Diagnosis of  By: Charlsie Quest RMA, Lucy     Joint pain    Lactose intolerance    LEG CRAMPS, NOCTURNAL 11/14/2010   Qualifier: Diagnosis of  By: Floydene Flock     Long-term current use of steroids 04/20/2013   LYMPHADENOPATHY 06/10/2007   Qualifier: Diagnosis of  By: Janit Bern     MORBID OBESITY 06/26/2007   Qualifier: Diagnosis of  By: Tyrone Apple, Lucy     MUSCLE PAIN 03/31/2008   Qualifier: Diagnosis of  By: Alwyn Ren MD, William     Noninfectious gastroenteritis and colitis 03/17/2013   Obesity (BMI 30-39.9) 03/16/2014   OSA  (obstructive sleep apnea) 03/08/2016   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/23/2010   Qualifier: Diagnosis of  By: Laury Axon DOMyrene Buddy     Rheumatoid arthritis (HCC)    Seasonal allergies 05/14/2015   THYROID CYST 06/26/2007   Qualifier: Diagnosis of  By: Charlsie Quest RMA, Lucy     THYROID NODULE, HX OF 06/10/2007   Qualifier: Diagnosis of  By: Janit Bern     Vitamin D deficiency     Patient Active Problem List   Diagnosis Date Noted   Elevated serum creatinine 06/26/2021   COVID-19 04/26/2021   Diverticulitis 02/20/2021   Depression 07/10/2020   History of gastric bypass 07/10/2020   Has daytime drowsiness 05/09/2020   Other fatigue 02/29/2020   Short of breath on exertion 02/29/2020   Vitamin D deficiency 08/11/2018   Gastric bypass status for obesity 07/30/2018   Hives 06/07/2018   Primary osteoarthritis of right knee 01/23/2018   Primary osteoarthritis of both hands 01/23/2018   History of iritis 11/28/2017   History of total hip replacement, left 11/28/2017   Primary osteoarthritis of both knees 11/28/2017   Obstructive sleep apnea 03/08/2016   Seasonal allergies 05/14/2015   Obesity (BMI 30-39.9) 03/16/2014   Long-term current use of steroids 04/20/2013   Noninfectious gastroenteritis and colitis 03/17/2013   Calf pain 10/13/2012   Acute sprain or strain of cervical region 04/10/2012   LEG CRAMPS, NOCTURNAL 11/14/2010   OTHER SPECIFIED DISEASE OF WHITE BLOOD CELLS 07/23/2010   CONSTIPATION 04/06/2010   CORNEAL ABRASION, LEFT 01/22/2010   CHEST PAIN UNSPECIFIED 10/11/2009   BLOOD IN STOOL 12/02/2008   ANKYLOSING SPONDYLITIS 03/31/2008   MUSCLE PAIN 03/31/2008   BARIATRIC SURGERY STATUS 01/14/2008   THYROID CYST 06/26/2007   Class 2 severe obesity with serious comorbidity and body mass index (BMI) of 36.0 to 36.9 in adult [facility]) 06/26/2007   Primary hypertension 06/26/2007   GERD 06/26/2007   IBS 06/26/2007   LYMPHADENOPATHY 06/10/2007   History of thyroid  nodule 06/10/2007    Past Surgical History:  Procedure Laterality Date   BARIATRIC SURGERY     BREAST REDUCTION SURGERY  1996   COLONOSCOPY  10/24/2009   kaplan-normal exam   ESOPHAGOGASTRODUODENOSCOPY     pelvis replacement  2010   TOTAL HIP ARTHROPLASTY     x 2 left, one revision   UPPER GASTROINTESTINAL ENDOSCOPY       OB History     Gravida  0   Para  0   Term  0   Preterm  0   AB  0   Living  0      SAB  0   IAB  0   Ectopic  0   Multiple  0   Live Births  0           Family History  Problem Relation Age of Onset   Hypertension Mother    High Cholesterol Mother    Diabetes Mother    Arthritis Father    Hypertension Father    Diabetes Father    High Cholesterol Father    Melanoma Maternal Grandmother    Breast cancer Maternal Grandmother        and Aunt   Hypertension Sister    Diabetes Sister    Colon cancer Neg Hx    Esophageal cancer Neg Hx    Rectal cancer Neg Hx    Stomach cancer Neg Hx    Colon polyps Neg Hx     Social History   Tobacco Use   Smoking status: Never   Smokeless tobacco: Never  Vaping Use   Vaping Use: Never used  Substance Use Topics   Alcohol use: No    Alcohol/week: 0.0 standard drinks   Drug use: No    Home Medications Prior to Admission medications   Medication Sig Start Date End Date Taking? Authorizing Provider  amLODipine (NORVASC) 5 MG tablet Take 1 tablet (5 mg total) by mouth daily. 06/26/21   Donato Schultz, DO  Biotin w/ Vitamins C & E (HAIR SKIN & NAILS GUMMIES PO) Take 1 each by mouth daily.    [provider]  Calcium Carb-Cholecalciferol (CALTRATE 600+D3 SOFT PO) Take by mouth.    [provider]  esomeprazole (NEXIUM) 40 MG capsule Take 1 capsule (40 mg total) by mouth daily as needed. Crack open capsule and ingest contents 04/17/21   Armbruster, Willaim Rayas, MD  fluticasone [facility]) 50 MCG/ACT nasal spray Use 2 spray(s) in each nostril once daily 06/01/21   Zola Button, Grayling Congress, DO  hydrochlorothiazide (HYDRODIURIL) 25 MG tablet Take 1 tablet (25 mg total) by mouth daily. 07/12/21   Donato Schultz, DO  Phentermine HCl [facility]) 8 MG TABS Will take with her lunch daily 04/12/21   Corinna Capra A, DO  vitamin B-12 (CYANOCOBALAMIN) 1000 MCG tablet Take 1,000 mcg by mouth daily.    [provider]  Vitamin D, Ergocalciferol, (DRISDOL) 1.25 MG (50000 UNIT) CAPS capsule Take 1 capsule (50,000 Units total) by mouth every 7 (seven) days. 07/11/21   Corinna Capra A, DO    Allergies    Aspirin, Codeine, Morphine, Sulfa antibiotics, and Sulfonamide derivatives  Review of Systems   Review of Systems  Constitutional:  Negative for fever.  HENT:  Negative for sore throat.   Respiratory:  Negative for shortness of breath.   Cardiovascular:  Positive for chest pain.  Gastrointestinal:  Negative for abdominal pain, nausea and vomiting.  Genitourinary:  Negative for flank pain.  Musculoskeletal:  Positive for back pain (upper back).  Neurological:  Negative for dizziness, light-headedness and headaches.  All other systems reviewed and are negative.  Physical Exam Updated Vital Signs BP 131/64 (BP Location: Left Arm)   Pulse 72   Temp 98.1 F (36.7 C) (Oral)   Resp 18   Ht 5\' 2"  (1.575 m)   Wt 83.5 kg   SpO2 98%   BMI 33.65 kg/m   Physical Exam Vitals and nursing note reviewed.  Constitutional:      General: She is not in acute distress.    Appearance: She is well-developed.  HENT:     Head: Normocephalic and atraumatic.  Eyes:  Conjunctiva/sclera: Conjunctivae normal.  Cardiovascular:     Rate and Rhythm: Normal rate and regular rhythm.     Heart sounds: No murmur heard. Pulmonary:     Effort: Pulmonary effort is normal. No respiratory distress.     Breath sounds: Normal breath sounds.  Abdominal:     Palpations: Abdomen is soft.     Tenderness: There is no abdominal tenderness.  Musculoskeletal:     Cervical back: Neck  supple.  Skin:    General: Skin is warm and dry.  Neurological:     Mental Status: She is alert.    ED Results / Procedures / Treatments   Labs (all labs ordered are listed, but only abnormal results are displayed) Labs Reviewed  BASIC METABOLIC PANEL - Abnormal; Notable for the following components:      Result Value   Potassium 3.4 (*)    All other components within normal limits  CBC  HEPATIC FUNCTION PANEL  TROPONIN I (HIGH SENSITIVITY)  TROPONIN I (HIGH SENSITIVITY)    EKG EKG Interpretation  Date/Time:  Wednesday July 25 2021 12:00:26 EDT Ventricular Rate:  68 PR Interval:  166 QRS Duration: 70 QT Interval:  362 QTC Calculation: 384 R Axis:   19 Text Interpretation: Normal sinus rhythm Cannot rule out Anterior infarct , age undetermined Abnormal ECG Confirmed by Ernie Avena ([id number]) on 07/25/2021 12:04:19 PM  Radiology DG Chest 2 View  Result Date: 07/25/2021 CLINICAL DATA:  Chest pain radiating to the right arm EXAM: CHEST - 2 VIEW COMPARISON:  09/14/2020 FINDINGS: Heart size is normal. Aortic atherosclerotic calcification is noted. The lungs are clear. The vascularity is normal. No effusions. No bony abnormality. IMPRESSION: No active disease.  Aortic atherosclerosis. Electronically Signed   By: Paulina Fusi M.D.   On: 07/25/2021 13:07   CT Angio Chest Aorta W and/or Wo Contrast  Result Date: 07/25/2021 CLINICAL DATA:  Intermittent left-sided chest pain. EXAM: CT ANGIOGRAPHY CHEST WITH CONTRAST TECHNIQUE: Multidetector CT imaging of the chest was performed using the standard protocol during bolus administration of intravenous contrast. Multiplanar CT image reconstructions and MIPs were obtained to evaluate the vascular anatomy. CONTRAST:  OMNIPAQUE IOHEXOL 350 MG/ML SOLN COMPARISON:  June 07, 2009 FINDINGS: Cardiovascular: There is mild calcification of the aortic arch, without evidence of aortic aneurysm or dissection. Satisfactory opacification of the  pulmonary arteries to the segmental level. No evidence of pulmonary embolism. Normal heart size. No pericardial effusion. Mediastinum/Nodes: No enlarged mediastinal, hilar, or axillary lymph nodes. Thyroid gland, trachea, and esophagus demonstrate no significant findings. Lungs/Pleura: Lungs are clear. No pleural effusion or pneumothorax. Upper Abdomen: There is a small hiatal hernia. Surgical sutures are seen within the gastric region. Musculoskeletal: No chest wall abnormality. No acute or significant osseous findings. Review of the MIP images confirms the above findings. IMPRESSION: 1. No CT evidence of pulmonary embolism or acute cardiopulmonary disease. 2. Small hiatal hernia. Aortic Atherosclerosis (ICD10-I70.0). Electronically Signed   By: Aram Candela M.D.   On: 07/25/2021 19:11    Procedures Procedures   Medications Ordered in ED Medications  iohexol (OMNIPAQUE) 350 MG/ML injection 100 mL (100 mLs Intravenous Contrast Given 07/25/21 1815)    ED Course  I have reviewed the triage vital signs and the nursing notes.  Pertinent labs & imaging results that were available during my care of the patient were reviewed by me and considered in my medical decision making (see chart for details).    MDM Rules/Calculators/A&P  Patient presents to the ED  with a chief complaint of intermittent chest pain that is been ongoing for the past week.  Described as left-sided cramping with radiation to her upper back.  Not alleviated with any over-the-counter medication.  On my exam she is overall well-appearing, nontoxic.  Pain is not reproducible with palpation at this time.  Does describe it beginning in the front of her chest and radiating to her upper back, does have a prior history of chronic back pain due to her AS.  She was not given any medication on arrival, reports her pain has been kind of persistent.  No shortness of breath. No tachycardia, vitals have been within normal limits without any  signs of hypoxia.  She does not have any prior history of blood clot, some suspicion for dissection at this time, as patient does report pain radiates immediately to her back and has not happened in the past despite her history of chronic back pain.  Labs on today's visit showed a CBC with no leukocytosis, hemoglobin is stable.  Hepatic function is with normal LFTs, denies any pain on the abdominal region, I do not have suspicion for lithiasis.  Chest x-ray without any pneumonia, pleural effusion or acute finding noted.  CTA chest showed:  There is mild calcification of the aortic arch,  without evidence of aortic aneurysm or dissection. Satisfactory  opacification of the pulmonary arteries to the segmental level. No  evidence of pulmonary embolism. Normal heart size. No pericardial  effusion.   Did discuss the results with patient, we do feel the pain is most likely originating from her chronic back pain at this time and may be further spasms.  She was advised to follow-up with cardiologist, work-up in the ED has been reassuring with a normal EKG, no changes in her vital signs, troponins have remained flat.  She understands and agrees to management at this time.  Return precautions were discussed at length.  Patient was stable for discharge.   Portions of this note were generated with Scientist, clinical (histocompatibility and immunogenetics). Dictation errors may occur despite best attempts at proofreading.  Final Clinical Impression(s) / ED Diagnoses Final diagnoses:  Atypical chest pain    Rx / DC Orders ED Discharge Orders     None        Claude Manges, PA-C 07/26/21 1956    Ernie Avena, MD 07/27/21 [phone]

## 2021-07-25 NOTE — Telephone Encounter (Signed)
Nurse Assessment Nurse: Izola Price, RN, Beth Date/Time (Eastern Time): 07/25/2021 10:41:42 AM Confirm and document reason for call. If symptomatic, describe symptoms. ---Caller states she is having chest pain. Caller has been several days since. Does the patient have any new or worsening symptoms? ---Yes Will a triage be completed? ---Yes Related visit to physician within the last 2 weeks? ---Yes Does the PT have any chronic conditions? (i.e. diabetes, asthma, this includes High risk factors for pregnancy, etc.) ---Yes List chronic conditions. ---spondlylisis, HTN, Is this a behavioral health or substance abuse call? ---No Guidelines Guideline Title Affirmed Question Affirmed Notes Nurse Date/Time (Eastern Time) Chest Pain [1] Chest pain (or "angina") comes and goes AND [2] is happening more often (increasing in frequency) or getting worse (increasing in severity) (Exception: chest pains that last only a few seconds) Izola Price, RN, [facility] 07/25/2021 10:42:53 AM PLEASE NOTE: All timestamps contained within this report are represented as [place] Standard Time. CONFIDENTIALTY NOTICE: This fax transmission is intended only for the addressee. It contains information that is legally privileged, confidential or otherwise protected from use or disclosure. If you are not the intended recipient, you are strictly prohibited from reviewing, disclosing, copying using or disseminating any of this information or taking any action in reliance on or regarding this information. If you have received this fax in error, please notify us immediately by telephone so that we can arrange for its return to [place]. Phone: [phone], Toll-Free: [phone], Fax: [phone] Page: 2 of 2 Call Id: [id number] Disp. Time Lamount Cohen Time) Disposition Final User 07/25/2021 10:40:41 AM Send to Urgent Queue [facility] 07/25/2021 10:47:25 AM Go to ED Now Yes Izola Price, RN, Beth Caller Disagree/Comply Comply Caller  Understands Yes PreDisposition Call Doctor Care Advice Given Per Guideline GO TO ED NOW: * You need to be seen in the Emergency Department. * Go to the ED at ___________ Hospital. * Leave now. Drive carefully. NOTHING BY MOUTH: * Do not eat or drink anything for now. CALL EMS IF: * Severe difficulty breathing occurs * Passes out or becomes too weak to stand * You become worse CARE ADVICE given per Chest Pain (Adult) guideline. Referrals GO TO FACILITY UNDECIDED

## 2021-07-25 NOTE — Telephone Encounter (Signed)
Noted. Pt sent to triage.  °

## 2021-07-25 NOTE — Telephone Encounter (Signed)
Pt called back this morning and was sent to triage.

## 2021-07-25 NOTE — ED Provider Notes (Signed)
Ultrasound ED Peripheral IV (Provider)  Date/Time: 07/25/2021 5:10 PM Performed by: Sloan Leiter, DO Authorized by: Sloan Leiter, DO   Procedure details:    Indications: multiple failed IV attempts and poor IV access     Skin Prep: chlorhexidine gluconate     Location:  Right AC   Angiocath:  20 G   Bedside Ultrasound Guided: Yes     Images: not archived     Patient tolerated procedure without complications: Yes     Dressing applied: Yes      Sloan Leiter, DO 07/25/21 1710

## 2021-07-25 NOTE — ED Notes (Signed)
IV attempt x 2 by Thresa Ross, RN

## 2021-07-25 NOTE — Telephone Encounter (Signed)
Pt. Called in and stated that she has been having chest discomfort/pain that runs through her chest and wraps around to her back. Transferred pt to triage for further advice.

## 2021-07-25 NOTE — Telephone Encounter (Signed)
Error

## 2021-07-25 NOTE — ED Notes (Signed)
IV attempt x 1 by Real Cons, RN

## 2021-07-25 NOTE — Telephone Encounter (Signed)
Pt currently in the ED.

## 2021-08-01 ENCOUNTER — Ambulatory Visit (INDEPENDENT_AMBULATORY_CARE_PROVIDER_SITE_OTHER): Payer: Medicare Other | Admitting: Bariatrics

## 2021-08-10 ENCOUNTER — Ambulatory Visit: Payer: Medicare Other | Admitting: Family Medicine

## 2021-08-14 ENCOUNTER — Encounter (INDEPENDENT_AMBULATORY_CARE_PROVIDER_SITE_OTHER): Payer: Self-pay | Admitting: Bariatrics

## 2021-08-14 ENCOUNTER — Other Ambulatory Visit: Payer: Self-pay

## 2021-08-14 ENCOUNTER — Ambulatory Visit (INDEPENDENT_AMBULATORY_CARE_PROVIDER_SITE_OTHER): Payer: Medicare Other | Admitting: Bariatrics

## 2021-08-14 VITALS — BP 139/82 | HR 64 | Temp 97.5°F | Ht 62.0 in | Wt 186.0 lb

## 2021-08-14 DIAGNOSIS — E559 Vitamin D deficiency, unspecified: Secondary | ICD-10-CM | POA: Diagnosis not present

## 2021-08-14 DIAGNOSIS — Z6835 Body mass index (BMI) 35.0-35.9, adult: Secondary | ICD-10-CM | POA: Diagnosis not present

## 2021-08-14 DIAGNOSIS — I10 Essential (primary) hypertension: Secondary | ICD-10-CM

## 2021-08-14 MED ORDER — VITAMIN D (ERGOCALCIFEROL) 1.25 MG (50000 UNIT) PO CAPS: 50000.0000 [IU] | ORAL_CAPSULE | ORAL | 0 refills | Status: DC

## 2021-08-14 NOTE — Progress Notes (Signed)
Chief Complaint:   OBESITY [name] is here to discuss her progress with her obesity treatment plan along with follow-up of her obesity related diagnoses. [name] is on the Category 1 Plan and states she is following her eating plan approximately 50% of the time. [name] states she is doing 0 minutes 0 times per week.  Today's visit was #: 8 Starting weight: 191 lbs Starting date: 02/20/2021 Today's weight: 186 lbs Today's date: 08/14/2021 Total lbs lost to date: 5 lbs Total lbs lost since last in-office visit: 0  Interim History: [name] is up 2 lbs since her last visit. She is following the plan about 50%.  Subjective:   1. Vitamin D insufficiency [name] is taking Vitamin D currently.    2. Essential hypertension [name] is currently taking Norvasc. She stopped HCTZ.  Assessment/Plan:   1. Vitamin D insufficiency Low Vitamin D level contributes to fatigue and are associated with obesity, breast, and colon cancer. We will refill prescription Vitamin D 50,000 IU every week for 1 month with no refills and [name] will follow-up for routine testing of Vitamin D, at least 2-3 times per year to avoid over-replacement.  - Vitamin D, Ergocalciferol, (DRISDOL) 1.25 MG (50000 UNIT) CAPS capsule; Take 1 capsule (50,000 Units total) by mouth every 7 (seven) days.  Dispense: 4 capsule; Refill: 0  2. Essential hypertension [name] will continue her medications. She will resume HCTS. She will use Power-aid or 6 zero. She will follow up with primary care provider at the end of the month. She is working on healthy weight loss and exercise to improve blood pressure control. We will watch for signs of hypotension as she continues her lifestyle modifications.   3. Obesity with current BMI of 34 [name] is currently in the action stage of change. As such, her goal is to continue with weight loss efforts. She has agreed to the Category 1 Plan.   [name] will continue meal planning and she  will continue intentional eating. Strategies for the holidays were provided.  Exercise goals:  [name] will start water aerobics.  Behavioral modification strategies: increasing lean protein intake, decreasing simple carbohydrates, increasing vegetables, increasing water intake, decreasing eating out, no skipping meals, meal planning and cooking strategies, keeping healthy foods in the home, and planning for success.  [name] has agreed to follow-up with our clinic the first of the year. She was informed of the importance of frequent follow-up visits to maximize her success with intensive lifestyle modifications for her multiple health conditions.   Objective:   Blood pressure 139/82, pulse 64, temperature (!) 97.5 F (36.4 C), height 5\' 2"  (1.575 m), weight 186 lb (84.4 kg), SpO2 100 %. Body mass index is 34.02 kg/m.  General: Cooperative, alert, well developed, in no acute distress. HEENT: Conjunctivae and lids unremarkable. Cardiovascular: Regular rhythm.  Lungs: Normal work of breathing. Neurologic: No focal deficits.   Lab Results  Component Value Date   CREATININE 0.68 07/25/2021   BUN 16 07/25/2021   NA 139 07/25/2021   K 3.4 (L) 07/25/2021   CL 100 07/25/2021   CO2 30 07/25/2021   Lab Results  Component Value Date   ALT 24 07/25/2021   AST 28 07/25/2021   ALKPHOS 71 07/25/2021   BILITOT 0.6 07/25/2021   Lab Results  Component Value Date   HGBA1C 5.5 06/20/2021   HGBA1C 5.5 02/20/2021   HGBA1C 5.4 08/02/2019   HGBA1C 5.3 02/18/2018   HGBA1C 5.5 06/11/2016   Lab Results  Component  Value Date   INSULIN 6.5 06/20/2021   INSULIN 3.5 02/20/2021   INSULIN 3.0 02/18/2018   Lab Results  Component Value Date   TSH 1.660 02/20/2021   Lab Results  Component Value Date   CHOL 161 02/20/2021   HDL 86 02/20/2021   LDLCALC 65 02/20/2021   TRIG 46 02/20/2021   CHOLHDL 2 08/22/2020   Lab Results  Component Value Date   VD25OH 67.5 06/20/2021   VD25OH 31.0  02/20/2021   VD25OH 43.61 08/22/2020   Lab Results  Component Value Date   WBC 6.4 07/25/2021   HGB 13.8 07/25/2021   HCT 41.1 07/25/2021   MCV 96.7 07/25/2021   PLT 342 07/25/2021   Lab Results  Component Value Date   IRON 120 06/27/2010   FERRITIN 42.7 06/27/2010   Attestation Statements:   Reviewed by clinician on day of visit: allergies, medications, problem list, medical history, surgical history, family history, social history, and previous encounter notes.  I, Jackson Latino, RMA, am acting as Energy manager for [employer], DO.   I have reviewed the above documentation for accuracy and completeness, and I agree with the above. Corinna Capra, DO

## 2021-08-21 ENCOUNTER — Other Ambulatory Visit: Payer: Self-pay | Admitting: Family Medicine

## 2021-08-21 DIAGNOSIS — I10 Essential (primary) hypertension: Secondary | ICD-10-CM

## 2021-08-27 ENCOUNTER — Encounter: Payer: Self-pay | Admitting: Family Medicine

## 2021-08-27 ENCOUNTER — Other Ambulatory Visit: Payer: Self-pay

## 2021-08-27 ENCOUNTER — Ambulatory Visit (INDEPENDENT_AMBULATORY_CARE_PROVIDER_SITE_OTHER): Payer: Medicare Other | Admitting: Family Medicine

## 2021-08-27 VITALS — BP 138/80 | HR 66 | Temp 98.5°F | Resp 18 | Ht 62.0 in | Wt 189.2 lb

## 2021-08-27 DIAGNOSIS — I10 Essential (primary) hypertension: Secondary | ICD-10-CM

## 2021-08-27 DIAGNOSIS — Z0000 Encounter for general adult medical examination without abnormal findings: Secondary | ICD-10-CM

## 2021-08-27 DIAGNOSIS — M459 Ankylosing spondylitis of unspecified sites in spine: Secondary | ICD-10-CM

## 2021-08-27 LAB — COMPREHENSIVE METABOLIC PANEL
ALT: 20 U/L (ref 0–35)
AST: 23 U/L (ref 0–37)
Albumin: 4.2 g/dL (ref 3.5–5.2)
Alkaline Phosphatase: 84 U/L (ref 39–117)
BUN: 9 mg/dL (ref 6–23)
CO2: 27 mEq/L (ref 19–32)
Calcium: 9.5 mg/dL (ref 8.4–10.5)
Chloride: 103 mEq/L (ref 96–112)
Creatinine, Ser: 0.61 mg/dL (ref 0.40–1.20)
GFR: 95.46 mL/min (ref >60.00)
Glucose, Bld: 81 mg/dL (ref 70–99)
Potassium: 4.1 mEq/L (ref 3.5–5.1)
Sodium: 141 mEq/L (ref 135–145)
Total Bilirubin: 0.7 mg/dL (ref 0.2–1.2)
Total Protein: 7.2 g/dL (ref 6.0–8.3)

## 2021-08-27 LAB — LIPID PANEL
Cholesterol: 154 mg/dL (ref 0–200)
HDL: 83.6 mg/dL (ref >39.00)
LDL Cholesterol: 60 mg/dL (ref 0–99)
NonHDL: 70.36
Total CHOL/HDL Ratio: 2
Triglycerides: 50 mg/dL (ref 0.0–149.0)
VLDL: 10 mg/dL (ref 0.0–40.0)

## 2021-08-27 LAB — CBC WITH DIFFERENTIAL/PLATELET
Basophils Absolute: 0 10*3/uL (ref 0.0–0.1)
Basophils Relative: 0.6 % (ref 0.0–3.0)
Eosinophils Absolute: 0.1 10*3/uL (ref 0.0–0.7)
Eosinophils Relative: 1.6 % (ref 0.0–5.0)
HCT: 38.4 % (ref 36.0–46.0)
Hemoglobin: 12.6 g/dL (ref 12.0–15.0)
Lymphocytes Relative: 43.4 % (ref 12.0–46.0)
Lymphs Abs: 1.5 10*3/uL (ref 0.7–4.0)
MCHC: 32.8 g/dL (ref 30.0–36.0)
MCV: 97 fl (ref 78.0–100.0)
Monocytes Absolute: 0.3 10*3/uL (ref 0.1–1.0)
Monocytes Relative: 9.3 % (ref 3.0–12.0)
Neutro Abs: 1.6 10*3/uL (ref 1.4–7.7)
Neutrophils Relative %: 45.1 % (ref 43.0–77.0)
Platelets: 303 10*3/uL (ref 150.0–400.0)
RBC: 3.95 Mil/uL (ref 3.87–5.11)
RDW: 13.4 % (ref 11.5–15.5)
WBC: 3.5 10*3/uL — ABNORMAL LOW (ref 4.0–10.5)

## 2021-08-27 LAB — HEMOGLOBIN A1C: Hgb A1c MFr Bld: 5.6 % (ref 4.6–6.5)

## 2021-08-27 LAB — VITAMIN D 25 HYDROXY (VIT D DEFICIENCY, FRACTURES): VITD: 39.34 ng/mL (ref 30.00–100.00)

## 2021-08-27 LAB — TSH: TSH: 1.17 u[IU]/mL (ref 0.35–5.50)

## 2021-08-27 NOTE — Progress Notes (Signed)
Subjective:     [name] is a 65 y.o. female and is here for a comprehensive physical exam. The patient reports problems - numbness at times -- not all the time and only at night  . No new back problems  She is also f/u bp and weight.    Social History   Socioeconomic History   Marital status: Married    Spouse name: Mccall Deberg   Number of children: Not on file   Years of education: Not on file   Highest education level: Not on file  Occupational History   Occupation: retired  Tobacco Use   Smoking status: Never   Smokeless tobacco: Never  Vaping Use   Vaping Use: Never used  Substance and Sexual Activity   Alcohol use: No    Alcohol/week: 0.0 standard drinks   Drug use: No   Sexual activity: Yes  Other Topics Concern   Not on file  Social History Narrative   Occupation: Production manager   Daily Caffeine use- 1 cup daily      Social Determinants of Health   Financial Resource Strain: Low Risk    Difficulty of Paying Living Expenses: Not hard at all  Food Insecurity: No Food Insecurity   Worried About Charity fundraiser in the Last Year: Never true   [place] in the Last Year: Never true  Transportation Needs: No Transportation Needs   Lack of Transportation (Medical): No   Lack of Transportation (Non-Medical): No  Physical Activity: Sufficiently Active   Days of Exercise per Week: 3 days   Minutes of Exercise per Session: 50 min  Stress: No Stress Concern Present   Feeling of Stress : Not at all  Social Connections: Moderately Integrated   Frequency of Communication with Friends and Family: More than three times a week   Frequency of Social Gatherings with Friends and Family: More than three times a week   Attends Religious Services: More than 4 times per year   Active Member of Clubs or Organizations: No   Attends Archivist Meetings: Never   Marital Status: Married  Human resources officer Violence: Not At Risk   Fear of Current or  Ex-Partner: No   Emotionally Abused: No   Physically Abused: No   Sexually Abused: No   Health Maintenance  Topic Date Due   Zoster Vaccines- Shingrix (1 of 2) Never done   COVID-19 Vaccine (4 - Booster for Pfizer series) 11/09/2020   PAP SMEAR-Modifier  09/30/2021   INFLUENZA VACCINE  12/28/2021 (Originally 04/30/2021)   MAMMOGRAM  02/09/2023   TETANUS/TDAP  03/16/2024   COLONOSCOPY (Pts 45-57yrs Insurance coverage will need to be confirmed)  04/13/2030   Hepatitis C Screening  Completed   HIV Screening  Completed   Pneumococcal Vaccine 20-80 Years old  Aged Out   HPV VACCINES  Aged Out    The following portions of the patient's history were reviewed and updated as appropriate: She  has a past medical history of Abdominal pain, other specified site (03/31/2008), Acute sprain or strain of cervical region (04/10/2012), ALLERGIC RHINITIS (06/26/2007), ANKYLOSING SPONDYLITIS (03/31/2008), Ankylosing spondylitis ([place]), Ankylosing spondylitis of multiple sites in spine ([place]), Arthritis, Back pain, Bariatric surgery status (01/14/2008), Blood in stool (12/02/2008), Blood transfusion without reported diagnosis, Calf pain (10/13/2012), CHEST PAIN UNSPECIFIED (10/11/2009), CONSTIPATION (04/06/2010), CORNEAL ABRASION, LEFT (01/22/2010), Gastritis, GERD (06/26/2007), GERD (gastroesophageal reflux disease), Hiatal hernia, Hypertension, IBS (06/26/2007), Joint pain, Lactose intolerance, LEG CRAMPS, NOCTURNAL (11/14/2010), Long-term current  use of steroids (04/20/2013), LYMPHADENOPATHY (06/10/2007), MORBID OBESITY (06/26/2007), MUSCLE PAIN (03/31/2008), Noninfectious gastroenteritis and colitis (03/17/2013), Obesity (BMI 30-39.9) (03/16/2014), OSA (obstructive sleep apnea) (03/08/2016), 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 She does not have any pertinent problems on  file. She  has a past surgical history that includes Esophagogastroduodenoscopy; Breast reduction surgery (1996); Total hip arthroplasty; Bariatric Surgery; pelvis replacement (2010); Upper gastrointestinal endoscopy; and Colonoscopy (10/24/2009). Her family history includes Arthritis in her father; Breast cancer in her maternal grandmother; Diabetes in her father, mother, and sister; High Cholesterol in her father and mother; Hypertension in her father, mother, and sister; Melanoma in her maternal grandmother. She  reports that she has never smoked. She has never used smokeless tobacco. She reports that she does not drink alcohol and does not use drugs. She has a current medication list which includes the following prescription(s): amlodipine, biotin w/ vitamins c & e, calcium carb-cholecalciferol, esomeprazole, fluticasone, vitamin b-12, and vitamin d (ergocalciferol). Current Outpatient Medications on File Prior to Visit  Medication Sig Dispense Refill   amLODipine (NORVASC) 5 MG tablet Take 1 tablet by mouth once daily 90 tablet 1   Biotin w/ Vitamins C & E (HAIR SKIN & NAILS GUMMIES PO) Take 1 each by mouth daily.     Calcium Carb-Cholecalciferol (CALTRATE 600+D3 SOFT PO) Take by mouth.     esomeprazole (NEXIUM) 40 MG capsule Take 1 capsule (40 mg total) by mouth daily as needed. Crack open capsule and ingest contents 90 capsule 0   fluticasone (FLONASE) 50 MCG/ACT nasal spray Use 2 spray(s) in each nostril once daily 16 g 0   vitamin B-12 (CYANOCOBALAMIN) 1000 MCG tablet Take 1,000 mcg by mouth daily.     Vitamin D, Ergocalciferol, (DRISDOL) 1.25 MG (50000 UNIT) CAPS capsule Take 1 capsule (50,000 Units total) by mouth every 7 (seven) days. 4 capsule 0   No current facility-administered medications on file prior to visit.   She is allergic to aspirin, codeine, morphine, sulfa antibiotics, and sulfonamide derivatives..  Review of Systems Review of Systems  Constitutional: Negative for  activity change, appetite change and fatigue.  HENT: Negative for hearing loss, congestion, tinnitus and ear discharge.  dentist q65m Eyes: Negative for visual disturbance (see optho q1y -- vision corrected to 20/20 with glasses).  Respiratory: Negative for cough, chest tightness and shortness of breath.   Cardiovascular: Negative for chest pain, palpitations and leg swelling.  Gastrointestinal: Negative for abdominal pain, diarrhea, constipation and abdominal distention.  Genitourinary: Negative for urgency, frequency, decreased urine volume and difficulty urinating.  Musculoskeletal: Negative for back pain, arthralgias and gait problem.  Skin: Negative for color change, pallor and rash.  Neurological: Negative for dizziness, light-headedness, numbness and headaches.  Hematological: Negative for adenopathy. Does not bruise/bleed easily.  Psychiatric/Behavioral: Negative for suicidal ideas, confusion, sleep disturbance, self-injury, dysphoric mood, decreased concentration and agitation.       Objective:    BP 138/80 (BP Location: Right Arm, Patient Position: Sitting, Cuff Size: Large)   Pulse 66   Temp 98.5 F (36.9 C) (Oral)   Resp 18   Ht 5\' 2"  (1.575 m)   Wt 189 lb 3.2 oz (85.8 kg)   SpO2 98%   BMI 34.61 kg/m  General appearance: alert, cooperative, appears stated age, and no distress Head: Normocephalic, without obvious abnormality, atraumatic Eyes: negative findings: lids and lashes normal, conjunctivae and sclerae normal,  and pupils equal, round, reactive to light and accomodation Ears: normal TM's and external ear canals both ears Throat: lips, mucosa, and tongue normal; teeth and gums normal Neck: no adenopathy, no carotid bruit, no JVD, supple, symmetrical, trachea midline, and thyroid not enlarged, symmetric, no tenderness/mass/nodules Back: symmetric, no curvature. ROM normal. No CVA tenderness. Lungs: clear to auscultation bilaterally Heart: regular rate and rhythm,  S1, S2 normal, no murmur, click, rub or gallop Abdomen: soft, non-tender; bowel sounds normal; no masses,  no organomegaly Extremities: extremities normal, atraumatic, no cyanosis or edema Pulses: 2+ and symmetric Skin: Skin color, texture, turgor normal. No rashes or lesions Lymph nodes: Cervical, supraclavicular, and axillary nodes normal. Neurologic: Alert and oriented X 3, normal strength and tone. Normal symmetric reflexes. Normal coordination and gait    Assessment:    Healthy female exam.      Plan:    Ghm utd Check labs  See After Visit Summary for Counseling Recommendations  Cologuard done per gyn 1. Preventative health care See above  - CBC with Differential/Platelet - Comprehensive metabolic panel - Hemoglobin A1c - Lipid panel - TSH - VITAMIN D 25 Hydroxy (Vit-D Deficiency, Fractures) - Insulin, random  2. Primary hypertension Well controlled, no changes to meds. Encouraged heart healthy diet such as the DASH diet and exercise as tolerated.   - CBC with Differential/Platelet - Comprehensive metabolic panel - Hemoglobin A1c - Lipid panel - TSH - VITAMIN D 25 Hydroxy (Vit-D Deficiency, Fractures) - Insulin, random  3. Morbid obesity (HCC) Per healthy weight and wellness  - CBC with Differential/Platelet - Comprehensive metabolic panel - Hemoglobin A1c - Lipid panel - TSH - VITAMIN D 25 Hydroxy (Vit-D Deficiency, Fractures) - Insulin, random  4. Ankylosing spondylitis, unspecified site of spine (HCC) Per ortho / rheum

## 2021-08-27 NOTE — Patient Instructions (Signed)
Preventive Care 92-63 Years Old, Female Preventive care refers to lifestyle choices and visits with your health care provider that can promote health and wellness. Preventive care visits are also called wellness exams. What can I expect for my preventive care visit? Counseling Your health care provider may ask you questions about your: Medical history, including: Past medical problems. Family medical history. Pregnancy history. Current health, including: Menstrual cycle. Method of birth control. Emotional well-being. Home life and relationship well-being. Sexual activity and sexual health. Lifestyle, including: Alcohol, nicotine or tobacco, and drug use. Access to firearms. Diet, exercise, and sleep habits. Work and work Statistician. Sunscreen use. Safety issues such as seatbelt and bike helmet use. Physical exam Your health care provider will check your: Height and weight. These may be used to calculate your BMI (body mass index). BMI is a measurement that tells if you are at a healthy weight. Waist circumference. This measures the distance around your waistline. This measurement also tells if you are at a healthy weight and may help predict your risk of certain diseases, such as type 2 diabetes and high blood pressure. Heart rate and blood pressure. Body temperature. Skin for abnormal spots. What immunizations do I need? Vaccines are usually given at various ages, according to a schedule. Your health care provider will recommend vaccines for you based on your age, medical history, and lifestyle or other factors, such as travel or where you work. What tests do I need? Screening Your health care provider may recommend screening tests for certain conditions. This may include: Lipid and cholesterol levels. Diabetes screening. This is done by checking your blood sugar (glucose) after you have not eaten for a while (fasting). Pelvic exam and Pap test. Hepatitis B test. Hepatitis C  test. HIV (human immunodeficiency virus) test. STI (sexually transmitted infection) testing, if you are at risk. Lung cancer screening. Colorectal cancer screening. Mammogram. Talk with your health care provider about when you should start having regular mammograms. This may depend on whether you have a family history of breast cancer. BRCA-related cancer screening. This may be done if you have a family history of breast, ovarian, tubal, or peritoneal cancers. Bone density scan. This is done to screen for osteoporosis. Talk with your health care provider about your test results, treatment options, and if necessary, the need for more tests. Follow these instructions at home: Eating and drinking  Eat a diet that includes fresh fruits and vegetables, whole grains, lean protein, and low-fat dairy products. Take vitamin and mineral supplements as recommended by your health care provider. Do not drink alcohol if: Your health care provider tells you not to drink. You are pregnant, may be pregnant, or are planning to become pregnant. If you drink alcohol: Limit how much you have to 0-1 drink a day. Know how much alcohol is in your drink. In the [place] one drink equals one 12 oz bottle of beer (355 mL), one 5 oz glass of wine (148 mL), or one 1 oz glass of hard liquor (44 mL). Lifestyle Brush your teeth every morning and night with fluoride toothpaste. Floss one time each day. Exercise for at least 30 minutes 5 or more days each week. Do not use any products that contain nicotine or tobacco. These products include cigarettes, chewing tobacco, and vaping devices, such as e-cigarettes. If you need help quitting, ask your health care provider. Do not use drugs. If you are sexually active, practice safe sex. Use a condom or other form of protection to prevent  STIs. If you do not wish to become pregnant, use a form of birth control. If you plan to become pregnant, see your health care provider for a  prepregnancy visit. Take aspirin only as told by your health care provider. Make sure that you understand how much to take and what form to take. Work with your health care provider to find out whether it is safe and beneficial for you to take aspirin daily. Find healthy ways to manage stress, such as: Meditation, yoga, or listening to music. Journaling. Talking to a trusted person. Spending time with friends and family. Minimize exposure to UV radiation to reduce your risk of skin cancer. Safety Always wear your seat belt while driving or riding in a vehicle. Do not drive: If you have been drinking alcohol. Do not ride with someone who has been drinking. When you are tired or distracted. While texting. If you have been using any mind-altering substances or drugs. Wear a helmet and other protective equipment during sports activities. If you have firearms in your house, make sure you follow all gun safety procedures. Seek help if you have been physically or sexually abused. What's next? Visit your health care provider once a year for an annual wellness visit. Ask your health care provider how often you should have your eyes and teeth checked. Stay up to date on all vaccines. This information is not intended to replace advice given to you by your health care provider. Make sure you discuss any questions you have with your health care provider. Document Revised: 03/14/2021 Document Reviewed: 03/14/2021 Elsevier Patient Education  [place].

## 2021-08-28 LAB — INSULIN, RANDOM: Insulin: 4.3 u[IU]/mL

## 2021-09-05 ENCOUNTER — Telehealth: Payer: Self-pay | Admitting: Gastroenterology

## 2021-09-05 MED ORDER — ESOMEPRAZOLE MAGNESIUM 40 MG PO CPDR: 40.0000 mg | DELAYED_RELEASE_CAPSULE | Freq: Every day | ORAL | 1 refills | Status: DC | PRN

## 2021-09-05 NOTE — Telephone Encounter (Signed)
Inbound call from patient. States need refill for Nexium sent to [facility] [address]

## 2021-09-05 NOTE — Telephone Encounter (Signed)
Patient has an appointment in January.  Refill sent

## 2021-09-21 ENCOUNTER — Ambulatory Visit: Payer: Medicare Other | Admitting: Podiatry

## 2021-10-02 ENCOUNTER — Telehealth (INDEPENDENT_AMBULATORY_CARE_PROVIDER_SITE_OTHER): Payer: Medicare Other | Admitting: Bariatrics

## 2021-10-02 ENCOUNTER — Encounter (INDEPENDENT_AMBULATORY_CARE_PROVIDER_SITE_OTHER): Payer: Self-pay | Admitting: Bariatrics

## 2021-10-02 DIAGNOSIS — Z6834 Body mass index (BMI) 34.0-34.9, adult: Secondary | ICD-10-CM | POA: Diagnosis not present

## 2021-10-02 DIAGNOSIS — E669 Obesity, unspecified: Secondary | ICD-10-CM

## 2021-10-02 DIAGNOSIS — E559 Vitamin D deficiency, unspecified: Secondary | ICD-10-CM

## 2021-10-02 DIAGNOSIS — G4733 Obstructive sleep apnea (adult) (pediatric): Secondary | ICD-10-CM | POA: Diagnosis not present

## 2021-10-02 DIAGNOSIS — I10 Essential (primary) hypertension: Secondary | ICD-10-CM | POA: Diagnosis not present

## 2021-10-02 NOTE — Progress Notes (Signed)
[name] has verbally consented to this TeleHealth visit. The patient is located at home, the provider is located at the [facility] office. The participants in this visit include the listed provider and patient. The visit was conducted today via video.  Chief Complaint: [name] is here to discuss her progress with her obesity treatment plan along with follow-up of her obesity related diagnoses. [name] is on the Category 1 Plan and states she is following her eating plan approximately 0% of the time. [name] states she is doing 0 minutes 0 times per week.  Today's visit was #: 9 Starting weight: 191 lbs Starting date: 02/20/2021  Interim History: [name] states that she is the same or may be up 2 lbs. She states that her weight is 186 lbs and was 189 lbs here at last visit.   Subjective:   1. Primary hypertension [name] will check her blood pressure at home.  2. Vitamin D insufficiency [name] is taking Vitamin D currently.  Assessment/Plan:   1. Primary hypertension [name] will continue her medications. She is working on healthy weight loss and exercise to improve blood pressure control. We will watch for signs of hypotension as she continues her lifestyle modifications.  2. Vitamin D insufficiency Low Vitamin D level contributes to fatigue and are associated with obesity, breast, and colon cancer. [name] agrees to continue to take prescription Vitamin D 2,000 IU daily  and she will follow-up for routine testing of Vitamin D, at least 2-3 times per year to avoid over-replacement. We will recheck Vitamin D in the future.   3. Class 1 obesity with serious comorbidity and body mass index (BMI) of 34.0 to 34.9 in adult, unspecified obesity type [name] is currently in the action stage of change. As such, her goal is to continue with weight loss efforts. She has agreed to following a lower carbohydrate, vegetable and lean protein rich diet plan.   [name]  will continue to weight at home. She will resume the plan 80-901%. She will keep protein and water intake high.   Exercise goals: [name] will start water aerobics class [facility] the aquatic [facility].   Behavioral modification strategies: increasing lean protein intake, decreasing simple carbohydrates, increasing vegetables, increasing water intake, decreasing eating out, no skipping meals, meal planning and cooking strategies, keeping healthy foods in the home, and planning for success.  [name] has agreed to follow-up with our clinic in 3 weeks. She was informed of the importance of frequent follow-up visits to maximize her success with intensive lifestyle modifications for her multiple health conditions.  Objective:   VITALS: Per patient if applicable, see vitals. GENERAL: Alert and in no acute distress. CARDIOPULMONARY: No increased WOB. Speaking in clear sentences.  PSYCH: Pleasant and cooperative. Speech normal rate and rhythm. Affect is appropriate. Insight and judgement are appropriate. Attention is focused, linear, and appropriate.  NEURO: Oriented as arrived to appointment on time with no prompting.   Lab Results  Component Value Date   CREATININE 0.61 08/27/2021   BUN 9 08/27/2021   NA 141 08/27/2021   K 4.1 08/27/2021   CL 103 08/27/2021   CO2 27 08/27/2021   Lab Results  Component Value Date   ALT 20 08/27/2021   AST 23 08/27/2021   ALKPHOS 84 08/27/2021   BILITOT 0.7 08/27/2021   Lab Results  Component Value Date   HGBA1C 5.6 08/27/2021   HGBA1C 5.5 06/20/2021   HGBA1C 5.5 02/20/2021   HGBA1C 5.4 08/02/2019   HGBA1C  5.3 02/18/2018   Lab Results  Component Value Date   INSULIN 6.5 06/20/2021   INSULIN 3.5 02/20/2021   INSULIN 3.0 02/18/2018   Lab Results  Component Value Date   TSH 1.17 08/27/2021   Lab Results  Component Value Date   CHOL 154 08/27/2021   HDL 83.60 08/27/2021   LDLCALC 60 08/27/2021   TRIG 50.0 08/27/2021   CHOLHDL 2 08/27/2021   Lab  Results  Component Value Date   VD25OH 39.34 08/27/2021   VD25OH 67.5 06/20/2021   VD25OH 31.0 02/20/2021   Lab Results  Component Value Date   WBC 3.5 (L) 08/27/2021   HGB 12.6 08/27/2021   HCT 38.4 08/27/2021   MCV 97.0 08/27/2021   PLT 303.0 08/27/2021   Lab Results  Component Value Date   IRON 120 06/27/2010   FERRITIN 42.7 06/27/2010    Attestation Statements:   Reviewed by clinician on day of visit: allergies, medications, problem list, medical history, surgical history, family history, social history, and previous encounter notes.  I, Lizbeth Bark, RMA, am acting as Location manager for [employer], DO.  I have reviewed the above documentation for accuracy and completeness, and I agree with the above. Jearld Lesch, DO

## 2021-10-03 ENCOUNTER — Encounter (INDEPENDENT_AMBULATORY_CARE_PROVIDER_SITE_OTHER): Payer: Self-pay | Admitting: Bariatrics

## 2021-10-05 ENCOUNTER — Ambulatory Visit: Payer: Medicare Other | Attending: Internal Medicine

## 2021-10-05 DIAGNOSIS — Z23 Encounter for immunization: Secondary | ICD-10-CM

## 2021-10-05 NOTE — Progress Notes (Signed)
° °  Covid-19 Vaccination Clinic  Name:  KAO BERKHEIMER    MRN: [id number] DOB: 07/10/1956  10/05/2021  Ms. Beadles was observed post Covid-19 immunization for 15 minutes without incident. She was provided with Vaccine Information Sheet and instruction to access the V-Safe system.   Ms. Lata was instructed to call 911 with any severe reactions post vaccine: Difficulty breathing  Swelling of face and throat  A fast heartbeat  A bad rash all over body  Dizziness and weakness   Immunizations Administered     Name Date Dose VIS Date Route   Pfizer Covid-19 Vaccine Bivalent Booster 10/05/2021 10:27 AM 0.3 mL 05/30/2021 Intramuscular   Manufacturer: [employer], [employer]   Lot: [id number]   NDC: [phone]

## 2021-10-08 ENCOUNTER — Other Ambulatory Visit (HOSPITAL_BASED_OUTPATIENT_CLINIC_OR_DEPARTMENT_OTHER): Payer: Self-pay

## 2021-10-08 MED ORDER — PFIZER COVID-19 VAC BIVALENT 30 MCG/0.3ML IM SUSP
INTRAMUSCULAR | 0 refills | Status: DC
  Filled 2021-10-08: qty 0.3, 1d supply, fill #0

## 2021-10-23 ENCOUNTER — Ambulatory Visit: Payer: Medicare Other | Admitting: Gastroenterology

## 2021-11-10 IMAGING — CR DG CHEST 2V
2 series · 2 of 2 positions shown · non-contrast
Comparison: 09/14/2020

CLINICAL DATA: Chest pain radiating to the right arm

EXAM:
CHEST - 2 VIEW

[w chest pa]
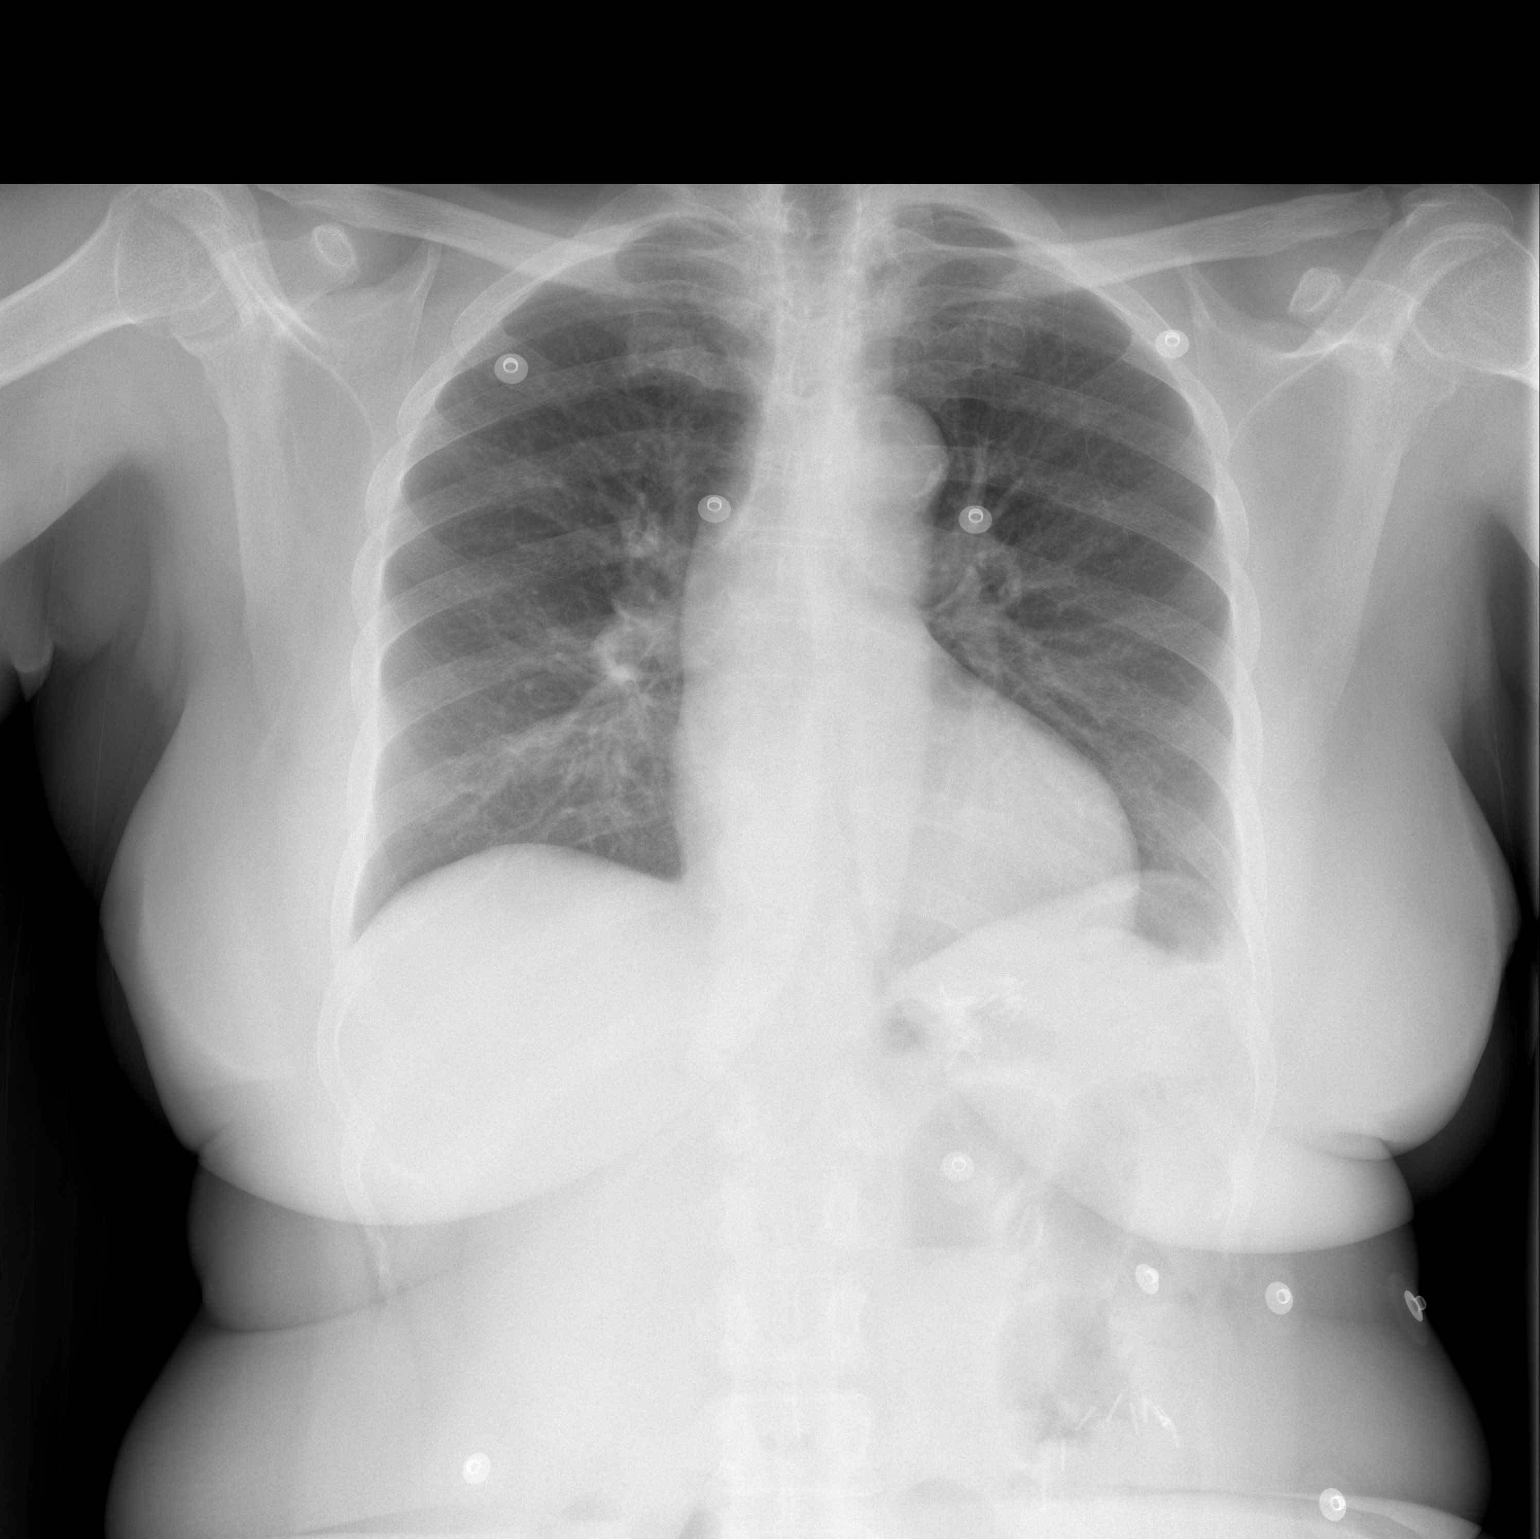

[w chest lat]
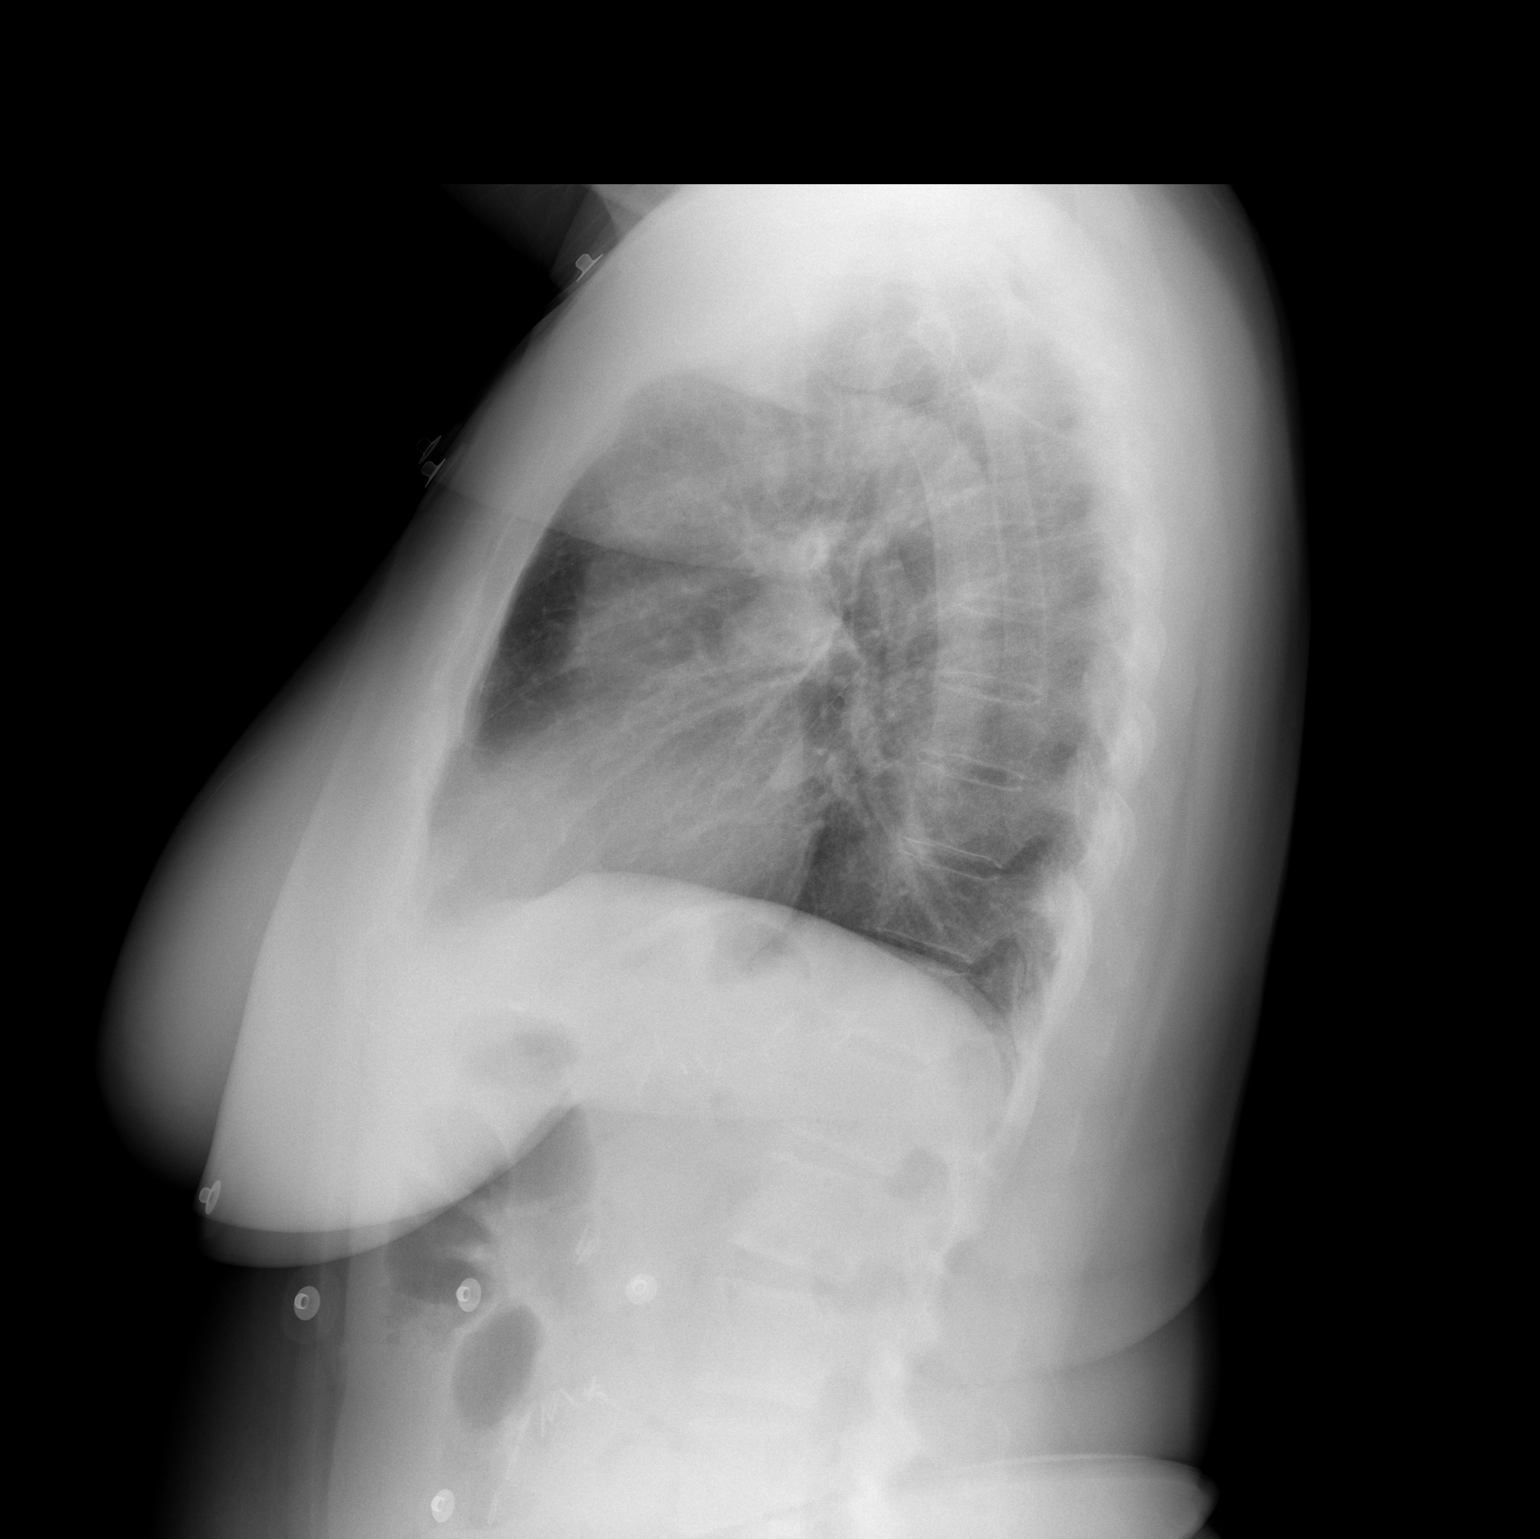

[2 of 2 positions shown; findings below may reference images not displayed]

FINDINGS: Heart size is normal. Aortic atherosclerotic calcification is noted.
The lungs are clear. The vascularity is normal. No effusions. No
bony abnormality.
IMPRESSION: No active disease.  Aortic atherosclerosis.

## 2021-11-10 IMAGING — CT CT ANGIO CHEST
3 of 9 series · 17 of 46 positions shown · IV contrast (Omnipaque)
Comparison: June 07, 2009

CLINICAL DATA: Intermittent left-sided chest pain.

EXAM:
CT ANGIOGRAPHY CHEST WITH CONTRAST
TECHNIQUE: Multidetector CT imaging of the chest was performed using the
standard protocol during bolus administration of intravenous
contrast. Multiplanar CT image reconstructions and MIPs were
obtained to evaluate the vascular anatomy.
CONTRAST:  100mL OMNIPAQUE IOHEXOL 350 MG/ML SOLN

[Series 6: axial arterial · axial · arterial · 0.71mm/px · z∈[-253,-61]mm · 12 of 76 slices shown]
[im 6/76  lung]
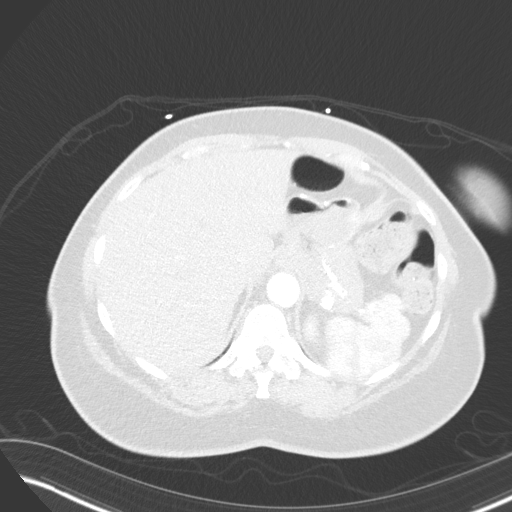
[im 12/76  soft-tissue]
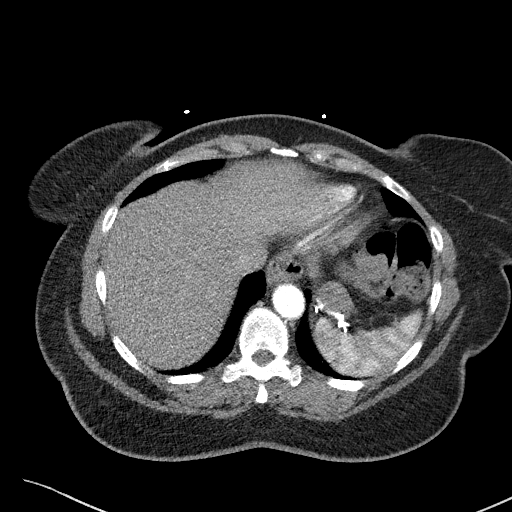
[im 18/76  lung]
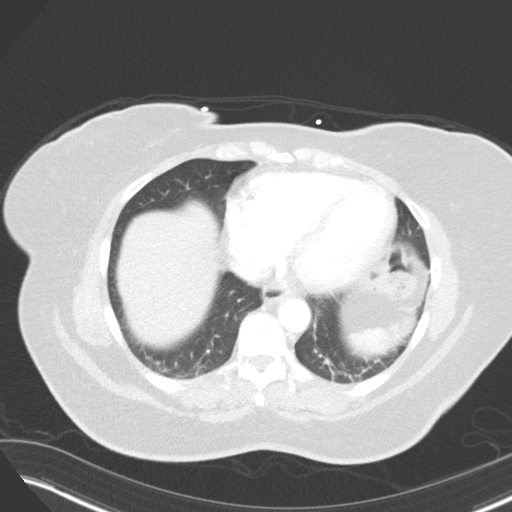
[im 24/76  soft-tissue]
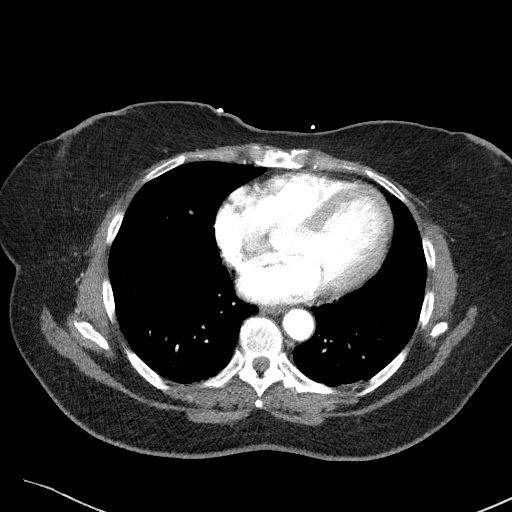
[im 29/76  lung]
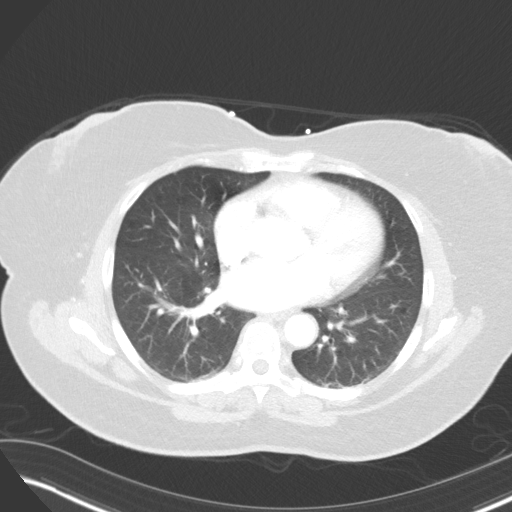
[im 35/76  soft-tissue]
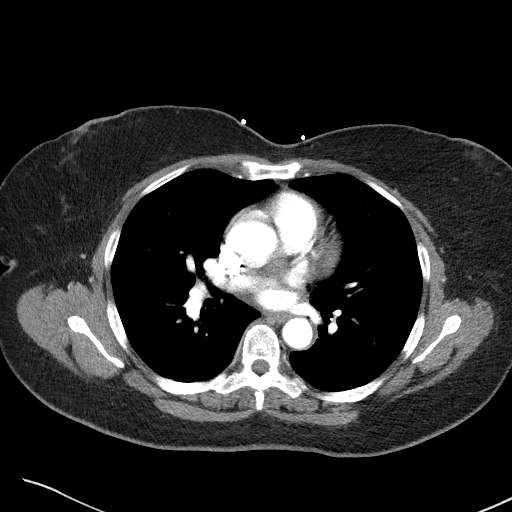
[im 41/76  lung]
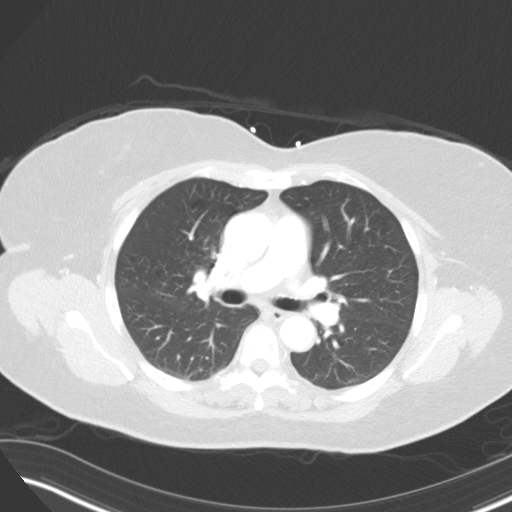
[im 47/76  soft-tissue]
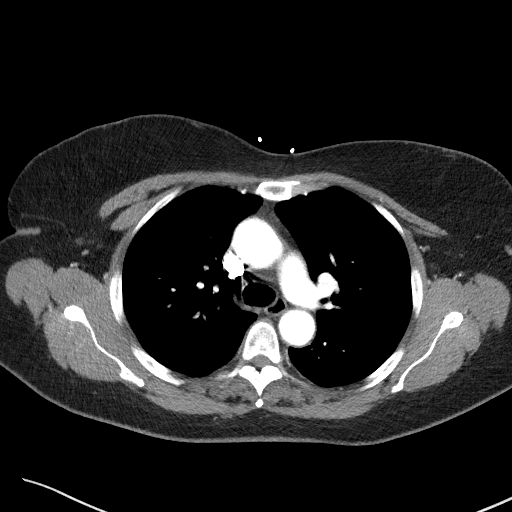
[im 52/76  lung]
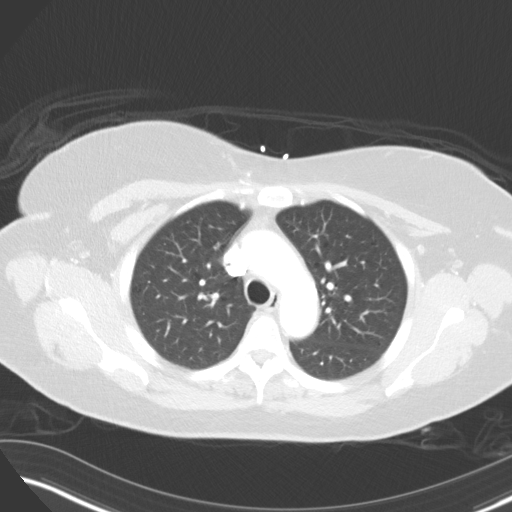
[im 58/76  soft-tissue]
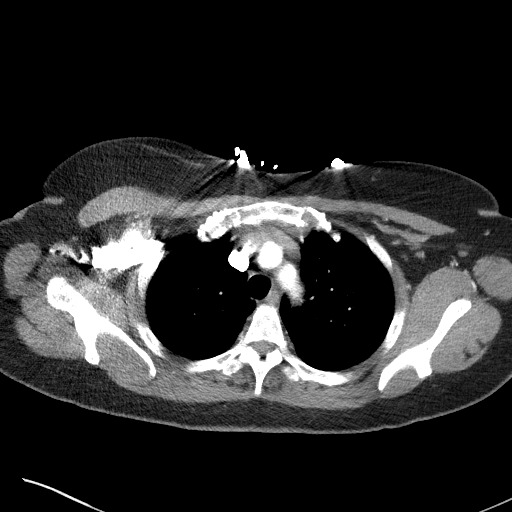
[im 64/76  lung]
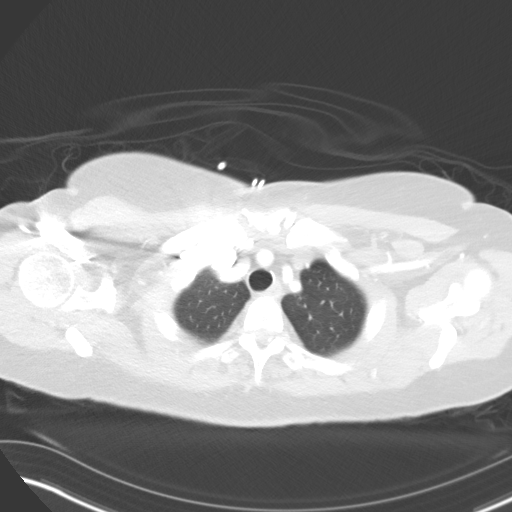
[im 70/76  soft-tissue]
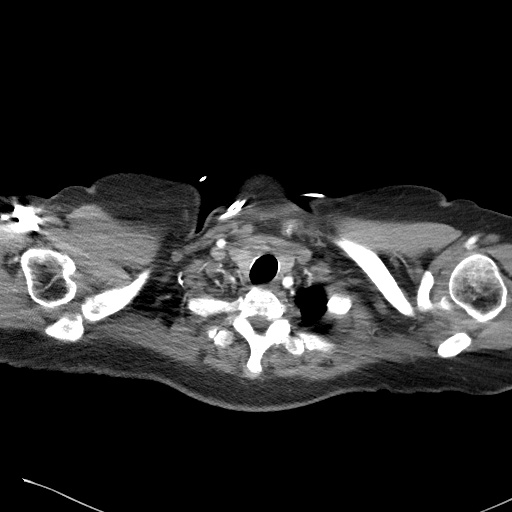

[Series 7: lung · axial · 0.95mm/px · z∈[-223,-193]mm · 2 of 43 slices shown]
[im 7/43  soft-tissue]
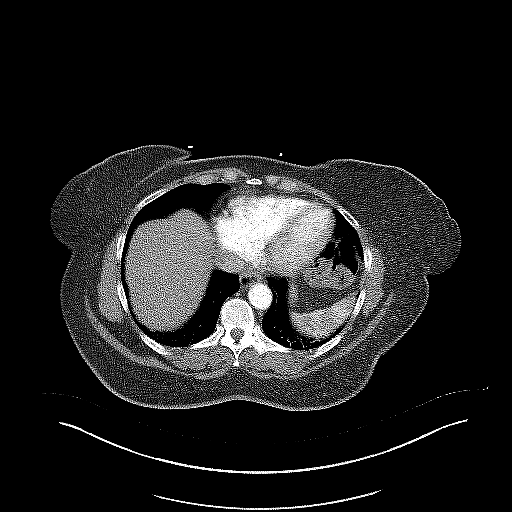
[im 13/43  soft-tissue]
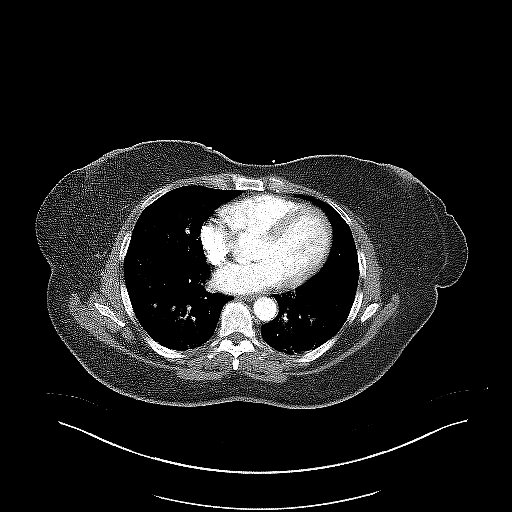

[Series 9: coronals · coronal · 0.44mm/px · 3 of 172 slices shown]
[im 43/172  soft-tissue]
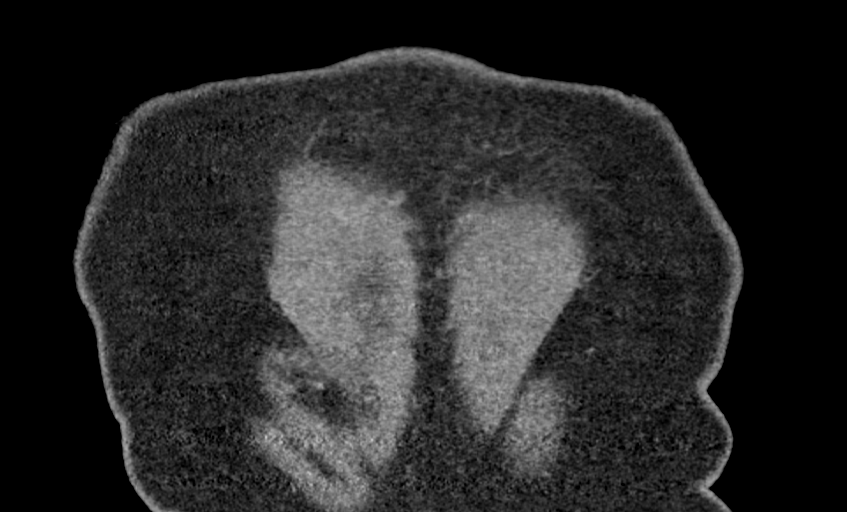
[im 86/172  soft-tissue]
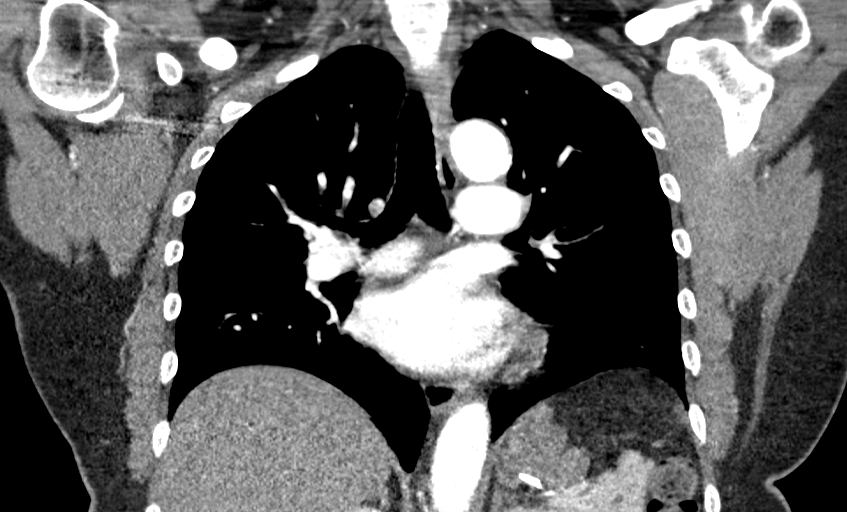
[im 129/172  soft-tissue]
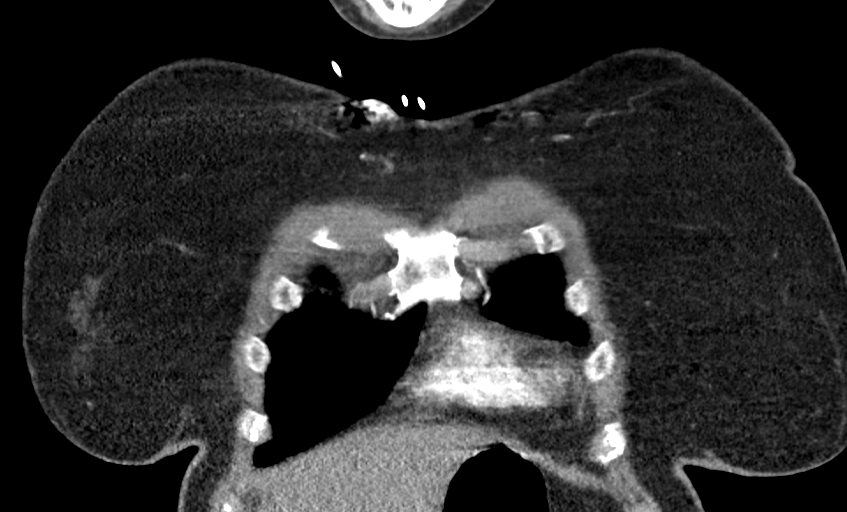

[17 of 46 positions shown; findings below may reference images not displayed]

FINDINGS: Cardiovascular: There is mild calcification of the aortic arch,
without evidence of aortic aneurysm or dissection. Satisfactory
opacification of the pulmonary arteries to the segmental level. No
evidence of pulmonary embolism. Normal heart size. No pericardial
effusion.

Mediastinum/Nodes: No enlarged mediastinal, hilar, or axillary lymph
nodes. Thyroid gland, trachea, and esophagus demonstrate no
significant findings.

Lungs/Pleura: Lungs are clear. No pleural effusion or pneumothorax.

Upper Abdomen: There is a small hiatal hernia.

Surgical sutures are seen within the gastric region.

Musculoskeletal: No chest wall abnormality. No acute or significant
osseous findings.

Review of the MIP images confirms the above findings.
IMPRESSION: 1. No CT evidence of pulmonary embolism or acute cardiopulmonary
disease.
2. Small hiatal hernia.

Aortic Atherosclerosis ([id number]).

## 2021-11-19 ENCOUNTER — Ambulatory Visit (INDEPENDENT_AMBULATORY_CARE_PROVIDER_SITE_OTHER): Payer: Medicare Other | Admitting: Bariatrics

## 2022-01-29 ENCOUNTER — Ambulatory Visit (INDEPENDENT_AMBULATORY_CARE_PROVIDER_SITE_OTHER): Payer: Medicare Other | Admitting: Family Medicine

## 2022-01-29 VITALS — BP 134/92 | HR 74 | Temp 99.2°F | Resp 18 | Ht 62.0 in | Wt 194.0 lb

## 2022-01-29 DIAGNOSIS — R350 Frequency of micturition: Secondary | ICD-10-CM | POA: Diagnosis not present

## 2022-01-29 DIAGNOSIS — R10819 Abdominal tenderness, unspecified site: Secondary | ICD-10-CM

## 2022-01-29 LAB — POC URINALSYSI DIPSTICK (AUTOMATED)
Bilirubin, UA: NEGATIVE
Blood, UA: NEGATIVE
Glucose, UA: NEGATIVE
Ketones, UA: NEGATIVE
Nitrite, UA: NEGATIVE
Protein, UA: NEGATIVE
Spec Grav, UA: 1.02 (ref 1.010–1.025)
Urobilinogen, UA: 0.2 E.U./dL
pH, UA: 5 (ref 5.0–8.0)

## 2022-01-29 NOTE — Patient Instructions (Signed)

## 2022-01-29 NOTE — Progress Notes (Signed)
? ?Subjective:  ? ?By signing my name below, I, Zite Okoli, attest that this documentation has been prepared under the direction and in the presence of Donato Schultz, DO. 01/29/2022  ? ? Patient ID: [name], female    DOB: 08/21/1956, 66 y.o.   MRN: [id number] ? ?Chief Complaint  ?Patient presents with  ? Abdominal Pain  ?  Pt states sxs have been going on for while and states having lower abdominal fullness and lower back pain and frequency.   ? ? ?HPI ?Patient is in today for an office visit. ? ?She complains of urinary symptoms that started a month ago. They include urinary frequency and urgency, smelly urine, back pain and fullness in the lower abdomen. Denies dysuria and hematuria.  ? ?Urinalysis was unremarkable, sent for urine culture. ? ?Past Medical History:  ?Diagnosis Date  ? Abdominal pain, other specified site 03/31/2008  ? Centricity Description: FLANK PAIN, RIGHT Qualifier: Diagnosis of  By: Alwyn Ren MD, Chrissie Noa   Centricity Description: ABDOMINAL PAIN OTHER SPECIFIED SITE Qualifier: Diagnosis of  By: Janit Bern    ? Acute sprain or strain of cervical region 04/10/2012  ? ALLERGIC RHINITIS 06/26/2007  ? Qualifier: Diagnosis of  By: Tyrone Apple, Lucy    ? ANKYLOSING SPONDYLITIS 03/31/2008  ? Qualifier: Diagnosis of  By: Alwyn Ren MD, William    ? Ankylosing spondylitis [facility])   ? neck and spin  ? Ankylosing spondylitis of multiple sites in spine [facility])   ? Arthritis   ? Back pain   ? Bariatric surgery status 01/14/2008  ? Qualifier: Diagnosis of  By: Janit Bern    ? Blood in stool 12/02/2008  ? Qualifier: Diagnosis of  By: Janit Bern    ? Blood transfusion without reported diagnosis   ? Calf pain 10/13/2012  ? CHEST PAIN UNSPECIFIED 10/11/2009  ? Qualifier: Diagnosis of  By: Arlyce Dice MD, Barbette Hair   ? CONSTIPATION 04/06/2010  ? Qualifier: Diagnosis of  By: Janit Bern    ? CORNEAL ABRASION, LEFT 01/22/2010  ? Qualifier: Diagnosis of  By: Janit Bern    ? Gastritis   ? GERD 06/26/2007   ? Qualifier: Diagnosis of  By: Tyrone Apple, Lucy    ? GERD (gastroesophageal reflux disease)   ? Hiatal hernia   ? Hypertension   ? no medications now  ? IBS 06/26/2007  ? Qualifier: Diagnosis of  By: Tyrone Apple, Lucy    ? Joint pain   ? Lactose intolerance   ? LEG CRAMPS, NOCTURNAL 11/14/2010  ? Qualifier: Diagnosis of  By: Floydene Flock    ? Long-term current use of steroids 04/20/2013  ? LYMPHADENOPATHY 06/10/2007  ? Qualifier: Diagnosis of  By: Janit Bern    ? MORBID OBESITY 06/26/2007  ? Qualifier: Diagnosis of  By: Tyrone Apple, Lucy    ? MUSCLE PAIN 03/31/2008  ? Qualifier: Diagnosis of  By: Alwyn Ren MD, William    ? Noninfectious gastroenteritis and colitis 03/17/2013  ? Obesity (BMI 30-39.9) 03/16/2014  ? OSA (obstructive sleep apnea) 03/08/2016  ? 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/23/2010  ? Qualifier: Diagnosis of  By: Janit Bern    ? Rheumatoid arthritis (HCC)   ? Seasonal allergies 05/14/2015  ? THYROID CYST 06/26/2007  ? Qualifier: Diagnosis of  By: Tyrone Apple, Lucy    ? THYROID NODULE, HX OF 06/10/2007  ? Qualifier: Diagnosis of  By: Janit Bern    ?  Vitamin D deficiency   ? ? ?Past Surgical History:  ?Procedure Laterality Date  ? BARIATRIC SURGERY    ? BREAST REDUCTION SURGERY  1996  ? COLONOSCOPY  10/24/2009  ? kaplan-normal exam  ? ESOPHAGOGASTRODUODENOSCOPY    ? pelvis replacement  2010  ? TOTAL HIP ARTHROPLASTY    ? x 2 left, one revision  ? UPPER GASTROINTESTINAL ENDOSCOPY    ? ? ?Family History  ?Problem Relation Age of Onset  ? Hypertension Mother   ? High Cholesterol Mother   ? Diabetes Mother   ? Arthritis Father   ? Hypertension Father   ? Diabetes Father   ? High Cholesterol Father   ? Melanoma Maternal Grandmother   ? Breast cancer Maternal Grandmother   ?     and Aunt  ? Hypertension Sister   ? Diabetes Sister   ? Colon cancer Neg Hx   ? Esophageal cancer Neg Hx   ? Rectal cancer Neg Hx   ? Stomach cancer Neg Hx   ? Colon polyps Neg Hx   ? ? ?Social History  ? ?Socioeconomic  History  ? Marital status: Married  ?  Spouse name: Yehuda Maondre Standley  ? Number of children: Not on file  ? Years of education: Not on file  ? Highest education level: Not on file  ?Occupational History  ? Occupation: retired  ?Tobacco Use  ? Smoking status: Never  ? Smokeless tobacco: Never  ?Vaping Use  ? Vaping Use: Never used  ?Substance and Sexual Activity  ? Alcohol use: No  ?  Alcohol/week: 0.0 standard drinks  ? Drug use: No  ? Sexual activity: Yes  ?Other Topics Concern  ? Not on file  ?Social History Narrative  ? Occupation: Sports coachQuality Consultant  ? Daily Caffeine use- 1 cup daily  ?   ? ?Social Determinants of Health  ? ?Financial Resource Strain: Low Risk   ? Difficulty of Paying Living Expenses: Not hard at all  ?Food Insecurity: No Food Insecurity  ? Worried About Programme researcher, broadcasting/film/videounning Out of Food in the Last Year: Never true  ? Ran Out of Food in the Last Year: Never true  ?Transportation Needs: No Transportation Needs  ? Lack of Transportation (Medical): No  ? Lack of Transportation (Non-Medical): No  ?Physical Activity: Sufficiently Active  ? Days of Exercise per Week: 3 days  ? Minutes of Exercise per Session: 50 min  ?Stress: No Stress Concern Present  ? Feeling of Stress : Not at all  ?Social Connections: Moderately Integrated  ? Frequency of Communication with Friends and Family: More than three times a week  ? Frequency of Social Gatherings with Friends and Family: More than three times a week  ? Attends Religious Services: More than 4 times per year  ? Active Member of Clubs or Organizations: No  ? Attends BankerClub or Organization Meetings: Never  ? Marital Status: Married  ?Intimate Partner Violence: Not At Risk  ? Fear of Current or Ex-Partner: No  ? Emotionally Abused: No  ? Physically Abused: No  ? Sexually Abused: No  ? ? ?Outpatient Medications Prior to Visit  ?Medication Sig Dispense Refill  ? Biotin w/ Vitamins C & E (HAIR SKIN & NAILS GUMMIES PO) Take 1 each by mouth daily.    ? Calcium Carb-Cholecalciferol  (CALTRATE 600+D3 SOFT PO) Take by mouth.    ? esomeprazole (NEXIUM) 40 MG capsule Take 1 capsule (40 mg total) by mouth daily as needed. Crack open capsule and ingest contents.  Please keep your January appointment for further refills. Thank you 30 capsule 1  ? fluticasone (FLONASE) 50 MCG/ACT nasal spray Use 2 spray(s) in each nostril once daily 16 g 0  ? vitamin B-12 (CYANOCOBALAMIN) 1000 MCG tablet Take 1,000 mcg by mouth daily.    ? Vitamin D, Ergocalciferol, (DRISDOL) 1.25 MG (50000 UNIT) CAPS capsule Take 1 capsule (50,000 Units total) by mouth every 7 (seven) days. 4 capsule 0  ? amLODipine (NORVASC) 5 MG tablet Take 1 tablet by mouth once daily (Patient not taking: Reported on 01/29/2022) 90 tablet 1  ? COVID-19 mRNA bivalent vaccine, Pfizer, (PFIZER COVID-19 VAC BIVALENT) injection Inject into the muscle. (Patient not taking: Reported on 01/29/2022) 0.3 mL 0  ? ?No facility-administered medications prior to visit.  ? ? ?Allergies  ?Allergen Reactions  ? Aspirin   ?  REACTION: intolerance-gi upset  ? Codeine Itching  ? Morphine Itching  ? Sulfa Antibiotics Other (See Comments)  ?  Unsure of reaction  ? Sulfonamide Derivatives   ?  Unsure of reaction  ? ? ?Review of Systems  ?Constitutional:  Negative for fever.  ?HENT:  Negative for congestion, ear pain, hearing loss, sinus pain and sore throat.   ?Eyes:  Negative for blurred vision and pain.  ?Respiratory:  Negative for cough, sputum production, shortness of breath and wheezing.   ?Cardiovascular:  Negative for chest pain and palpitations.  ?Gastrointestinal:  Negative for blood in stool, constipation, diarrhea, nausea and vomiting.  ?Genitourinary:  Positive for frequency and urgency. Negative for dysuria and hematuria.  ?     (+) smelly urine  ?Musculoskeletal:  Positive for back pain. Negative for falls and myalgias.  ?Neurological:  Negative for dizziness, sensory change, loss of consciousness, weakness and headaches.  ?Endo/Heme/Allergies:  Negative for  environmental allergies. Does not bruise/bleed easily.  ?Psychiatric/Behavioral:  Negative for depression and suicidal ideas. The patient is not nervous/anxious and does not have insomnia.   ? ?   ?Objective:

## 2022-01-30 LAB — CBC WITH DIFFERENTIAL/PLATELET
Basophils Absolute: 0.1 10*3/uL (ref 0.0–0.1)
Basophils Relative: 1.2 % (ref 0.0–3.0)
Eosinophils Absolute: 0.1 10*3/uL (ref 0.0–0.7)
Eosinophils Relative: 1.8 % (ref 0.0–5.0)
HCT: 39.5 % (ref 36.0–46.0)
Hemoglobin: 13.1 g/dL (ref 12.0–15.0)
Lymphocytes Relative: 40.1 % (ref 12.0–46.0)
Lymphs Abs: 1.9 10*3/uL (ref 0.7–4.0)
MCHC: 33.3 g/dL (ref 30.0–36.0)
MCV: 97 fl (ref 78.0–100.0)
Monocytes Absolute: 0.5 10*3/uL (ref 0.1–1.0)
Monocytes Relative: 10.4 % (ref 3.0–12.0)
Neutro Abs: 2.2 10*3/uL (ref 1.4–7.7)
Neutrophils Relative %: 46.5 % (ref 43.0–77.0)
Platelets: 284 10*3/uL (ref 150.0–400.0)
RBC: 4.07 Mil/uL (ref 3.87–5.11)
RDW: 13.4 % (ref 11.5–15.5)
WBC: 4.8 10*3/uL (ref 4.0–10.5)

## 2022-01-30 LAB — URINE CULTURE
MICRO NUMBER:: 13340032
SPECIMEN QUALITY:: ADEQUATE

## 2022-01-30 LAB — COMPREHENSIVE METABOLIC PANEL
ALT: 14 U/L (ref 0–35)
AST: 19 U/L (ref 0–37)
Albumin: 4 g/dL (ref 3.5–5.2)
Alkaline Phosphatase: 85 U/L (ref 39–117)
BUN: 16 mg/dL (ref 6–23)
CO2: 26 mEq/L (ref 19–32)
Calcium: 9.2 mg/dL (ref 8.4–10.5)
Chloride: 105 mEq/L (ref 96–112)
Creatinine, Ser: 0.63 mg/dL (ref 0.40–1.20)
GFR: 94.44 mL/min (ref >60.00)
Glucose, Bld: 86 mg/dL (ref 70–99)
Potassium: 4.1 mEq/L (ref 3.5–5.1)
Sodium: 139 mEq/L (ref 135–145)
Total Bilirubin: 0.5 mg/dL (ref 0.2–1.2)
Total Protein: 6.9 g/dL (ref 6.0–8.3)

## 2022-01-31 ENCOUNTER — Encounter: Payer: Self-pay | Admitting: Family Medicine

## 2022-02-09 NOTE — Progress Notes (Signed)
HPI ?F never smoker followed for OSA, complicated by  HTN, GERD, IBS, Thyroid Cyst, Ankylosing Spondylitis, Urticaria, Morbid  Obesity s/p Bariatric Surgery, Allergic Rhinitis ?NPSG 03/07/16- AHI 15.7/ hr, desaturation to 84%, body weight 194 lbs ?HST 04/24/20- AHI 24.1/ hr, desaturation to 80%, body weight 202 lbs ? ?======================================================= ? ? ? ?02/12/21- 64 yoF never smoker followed for OSA, complicated by  HTN, GERD, IBS, Thyroid Cyst, Ankylosing Spondylitis, Urticaria, Morbid  Obesity s/p Bariatric Surgery, Allergic Rhinitis ?CPAP auto 4-10/ [employer] ?Download-compliance 70%, AHI 0.8/ hr ?Body weight today-192 lbs ?Covid vax- 3 Phizer ?Weight down, working with Systems analyst.  Discussed weight loss impact on OSA. ?Noting some vertigo- has meclizine. ?CXR 09/14/20-  ?IMPRESSION: ?No acute cardiopulmonary disease. ? ?02/12/22-  65 yoF never smoker followed for OSA, complicated by  HTN, GERD, IBS, Thyroid Cyst, Ankylosing Spondylitis, Urticaria, Morbid  Obesity s/p Bariatric Surgery, Allergic Rhinitis ?CPAP auto 4-10/ [employer] ?Download-compliance 47%, AHI 0.7/ hr ?Body weight today 195 lbs ?Covid vax- 4 Phizer ?Flu vax- ?-----Patient would like new sleep study and would like to talk about inspire ?Because of ankylosing spondylitis she is mostly comfortable sleeping on her left side.  In that position she snores less.  Download reviewed.  She is aware that she has been using CPAP less consistently.  Had questions about alternatives and we discussed some, especially inspire.  We had reduced her pressures last time and that took care of the nasal stuffiness.  She would like an updated sleep study which we discussed. ?CTachest/aorta 07/25/21- ?IMPRESSION: ?1. No CT evidence of pulmonary embolism or acute cardiopulmonary ?disease. ?2. Small hiatal hernia. ?Aortic Atherosclerosis (ICD10-I70.0). ?  ? ?ROS-see HPI   + = positive ?Constitutional:    +weight loss, night sweats, fevers, chills,  fatigue, lassitude. ?HEENT:    headaches, difficulty swallowing, tooth/dental problems, sore throat,  ?     sneezing, itching, ear ache, nasal congestion, post nasal drip, snoring ?CV:    chest pain, orthopnea, PND, swelling in lower extremities, anasarca,                                  ? dizziness, +palpitations ?Resp:   shortness of breath with exertion or at rest.   ?             productive cough,   non-productive cough, coughing up of blood.   ?           change in color of mucus.  wheezing.   ?Skin:    rash or lesions. ?GI:  +-   Heartburn,+ indigestion, abdominal pain, nausea, vomiting, diarrhea,  ?               change in bowel habits, loss of appetite ?GU: dysuria, change in color of urine, no urgency or frequency.   flank pain. ?MS:   +joint pain, stiffness, decreased range of motion, +back pain. ?Neuro-     nothing unusual ?Psych:  change in mood or affect.  depression or anxiety.   memory loss. ? ?OBJ- Physical Exam ?General- Alert, Oriented, Affect-appropriate, Distress- none acute., + obese ?Skin- rash-none, lesions- none, excoriation- none ?Lymphadenopathy- none ?Head- atraumatic ?           Eyes- Gross vision intact, PERRLA, conjunctivae and secretions clear ?           Ears- Hearing, canals-normal ?           Nose- Clear, no-Septal  dev, mucus, polyps, erosion, perforation  ?           Throat- Mallampati IV , mucosa clear , drainage- none, tonsils- atrophic, + teeth ?Neck- flexible , trachea midline, no stridor , thyroid nl, carotid no bruit ?Chest - symmetrical excursion , unlabored ?          Heart/CV- RRR , no murmur , no gallop  , no rub, nl s1 s2 ?                          - JVD- none , edema- none, stasis changes- none, varices- none ?          Lung- clear to P&A, wheeze- none, cough- none , dullness-none, rub- none ?          Chest wall-  ?Abd-  ?Br/ Gen/ Rectal- Not done, not indicated ?Extrem- cyanosis- none, clubbing, none, atrophy- none, strength- nl ?Neuro- grossly intact to  observation ? ? ?

## 2022-02-10 ENCOUNTER — Encounter: Payer: Self-pay | Admitting: Internal Medicine

## 2022-02-12 ENCOUNTER — Encounter: Payer: Self-pay | Admitting: Internal Medicine

## 2022-02-12 ENCOUNTER — Ambulatory Visit: Payer: Medicare Other | Admitting: Internal Medicine

## 2022-02-12 VITALS — BP 144/88 | HR 68 | Temp 98.1°F | Ht 61.5 in | Wt 195.2 lb

## 2022-02-12 DIAGNOSIS — M450 Ankylosing spondylitis of multiple sites in spine: Secondary | ICD-10-CM

## 2022-02-12 DIAGNOSIS — G4733 Obstructive sleep apnea (adult) (pediatric): Secondary | ICD-10-CM

## 2022-02-12 NOTE — Assessment & Plan Note (Signed)
She benefits from CPAP when used but is considering alternatives which we discussed. ?Plan-update home sleep test for consideration of alternatives. ?

## 2022-02-12 NOTE — Assessment & Plan Note (Signed)
She is concerned about over manipulation of her neck which might be an issue during evaluation for inspire. ?

## 2022-02-12 NOTE — Patient Instructions (Addendum)
Order- schedule home sleep test (off CPAP)    dx OSA ? ?You can call for results about 2 weeks after your test. ? ?We can continue CPAP auto 4-10 ? ?Please call if we can help ?

## 2022-02-15 ENCOUNTER — Ambulatory Visit: Payer: Medicare Other

## 2022-02-15 DIAGNOSIS — G4733 Obstructive sleep apnea (adult) (pediatric): Secondary | ICD-10-CM

## 2022-02-27 DIAGNOSIS — G4733 Obstructive sleep apnea (adult) (pediatric): Secondary | ICD-10-CM | POA: Diagnosis not present

## 2022-03-01 DIAGNOSIS — R3915 Urgency of urination: Secondary | ICD-10-CM | POA: Diagnosis not present

## 2022-03-04 ENCOUNTER — Ambulatory Visit: Payer: Medicare Other | Admitting: Family Medicine

## 2022-03-06 ENCOUNTER — Telehealth: Payer: Self-pay | Admitting: Internal Medicine

## 2022-03-06 NOTE — Telephone Encounter (Signed)
Called patient to inform her of her sleep study results. She states she does not want the inspire device. She states she will call us back to see what she wants to do. She wants to check with her insurance first.   Nothing further needed at this time

## 2022-03-06 NOTE — Telephone Encounter (Signed)
Home sleep test showed mild obstructive sleep apnea averaging 12-133 apneas/ hour. We had discussed  her CPAP and alternatives at her last visit. If she would like referral to Dr Myrtis Ser to consider a fitted mouthpiece, or referral to ENT Drs Janet Berlin to consider Inspire surgery, that would be ok.

## 2022-03-06 NOTE — Telephone Encounter (Signed)
Called patient and she is wanting to know the results of her home sleep study results.  Please advise Dr Maple Hudson

## 2022-04-01 ENCOUNTER — Encounter (INDEPENDENT_AMBULATORY_CARE_PROVIDER_SITE_OTHER): Payer: Self-pay | Admitting: Family Medicine

## 2022-04-01 ENCOUNTER — Ambulatory Visit (INDEPENDENT_AMBULATORY_CARE_PROVIDER_SITE_OTHER): Payer: Medicare Other | Admitting: Family Medicine

## 2022-04-01 VITALS — BP 144/64 | HR 65 | Temp 97.9°F | Ht 62.0 in | Wt 190.0 lb

## 2022-04-01 DIAGNOSIS — E559 Vitamin D deficiency, unspecified: Secondary | ICD-10-CM

## 2022-04-01 DIAGNOSIS — R739 Hyperglycemia, unspecified: Secondary | ICD-10-CM

## 2022-04-01 DIAGNOSIS — E669 Obesity, unspecified: Secondary | ICD-10-CM

## 2022-04-01 DIAGNOSIS — Z6834 Body mass index (BMI) 34.0-34.9, adult: Secondary | ICD-10-CM

## 2022-04-01 DIAGNOSIS — R7309 Other abnormal glucose: Secondary | ICD-10-CM | POA: Diagnosis not present

## 2022-04-02 LAB — COMPREHENSIVE METABOLIC PANEL
ALT: 21 IU/L (ref 0–32)
AST: 23 IU/L (ref 0–40)
Albumin/Globulin Ratio: 1.4 (ref 1.2–2.2)
Albumin: 4.2 g/dL (ref 3.8–4.8)
Alkaline Phosphatase: 98 IU/L (ref 44–121)
BUN/Creatinine Ratio: 18 (ref 12–28)
BUN: 11 mg/dL (ref 8–27)
Bilirubin Total: 0.4 mg/dL (ref 0.0–1.2)
CO2: 21 mmol/L (ref 20–29)
Calcium: 9.6 mg/dL (ref 8.7–10.3)
Chloride: 102 mmol/L (ref 96–106)
Creatinine, Ser: 0.62 mg/dL (ref 0.57–1.00)
Globulin, Total: 2.9 g/dL (ref 1.5–4.5)
Glucose: 85 mg/dL (ref 70–99)
Potassium: 4.5 mmol/L (ref 3.5–5.2)
Sodium: 139 mmol/L (ref 134–144)
Total Protein: 7.1 g/dL (ref 6.0–8.5)
eGFR: 100 mL/min/{1.73_m2} (ref >59)

## 2022-04-02 LAB — INSULIN, RANDOM: INSULIN: 2.7 u[IU]/mL (ref 2.6–24.9)

## 2022-04-02 LAB — HEMOGLOBIN A1C
Est. average glucose Bld gHb Est-mCnc: 111 mg/dL
Hgb A1c MFr Bld: 5.5 % (ref 4.8–5.6)

## 2022-04-02 LAB — VITAMIN D 25 HYDROXY (VIT D DEFICIENCY, FRACTURES): Vit D, 25-Hydroxy: 44.8 ng/mL (ref 30.0–100.0)

## 2022-04-03 NOTE — Progress Notes (Signed)
Chief Complaint:   OBESITY [name] is here to discuss her progress with her obesity treatment plan along with follow-up of her obesity related diagnoses. [name] is on following a lower carbohydrate, vegetable and lean protein rich diet plan and states she is following her eating plan approximately 0% of the time. [name] states she is doing water aerobics 45 minutes 2 times per week.  Today's visit was #: 9 Starting weight: 191 lbs Starting date: 02/20/2021 Today's weight: 190 lbs Today's date: 04/01/2022 Total lbs lost to date: 1 lb Total lbs lost since last in-office visit: 0  Interim History: [name]'s husband had surgery and she took time away from program for a while. She has not followed meal plan consistently. She is open to food options. She wants to get back to structured eating.  Subjective:   1. Vitamin D insufficiency [name] is currently taking prescription Vit D 50,000 IU once a week. Denies any nausea, vomiting or muscle weakness. She notes fatigue.  2. Hyperglycemia [name]'s blood sugars have been elevated in the past. She denies history of prediabetes or diabetes.  Assessment/Plan:   1. Vitamin D insufficiency We will obtain labs today.  - VITAMIN D 25 Hydroxy (Vit-D Deficiency, Fractures)  2. Hyperglycemia We will obtain labs today.  - Comprehensive metabolic panel - Hemoglobin A1c - Insulin, random  3. Obesity with current BMI of 34.8 [name] is currently in the action stage of change. As such, her goal is to continue with weight loss efforts. She has agreed to the Category 1 Plan.   Exercise goals: All adults should avoid inactivity. Some physical activity is better than none, and adults who participate in any amount of physical activity gain some health benefits.  Behavioral modification strategies: increasing lean protein intake, meal planning and cooking strategies, keeping healthy foods in the home, and planning for success.  [name] has  agreed to follow-up with our clinic in 3 weeks. She was informed of the importance of frequent follow-up visits to maximize her success with intensive lifestyle modifications for her multiple health conditions.   [name] was informed we would discuss her lab results at her next visit unless there is a critical issue that needs to be addressed sooner. [name] agreed to keep her next visit at the agreed upon time to discuss these results.  Objective:   Blood pressure (!) 144/64, pulse 65, temperature 97.9 F (36.6 C), height 5\' 2"  (1.575 m), weight 190 lb (86.2 kg), SpO2 100 %. Body mass index is 34.75 kg/m.  General: Cooperative, alert, well developed, in no acute distress. HEENT: Conjunctivae and lids unremarkable. Cardiovascular: Regular rhythm.  Lungs: Normal work of breathing. Neurologic: No focal deficits.   Lab Results  Component Value Date   CREATININE 0.62 04/01/2022   BUN 11 04/01/2022   NA 139 04/01/2022   K 4.5 04/01/2022   CL 102 04/01/2022   CO2 21 04/01/2022   Lab Results  Component Value Date   ALT 21 04/01/2022   AST 23 04/01/2022   ALKPHOS 98 04/01/2022   BILITOT 0.4 04/01/2022   Lab Results  Component Value Date   HGBA1C 5.5 04/01/2022   HGBA1C 5.6 08/27/2021   HGBA1C 5.5 06/20/2021   HGBA1C 5.5 02/20/2021   HGBA1C 5.4 08/02/2019   Lab Results  Component Value Date   INSULIN 2.7 04/01/2022   INSULIN 6.5 06/20/2021   INSULIN 3.5 02/20/2021   INSULIN 3.0 02/18/2018   Lab Results  Component Value Date   TSH 1.17  08/27/2021   Lab Results  Component Value Date   CHOL 154 08/27/2021   HDL 83.60 08/27/2021   LDLCALC 60 08/27/2021   TRIG 50.0 08/27/2021   CHOLHDL 2 08/27/2021   Lab Results  Component Value Date   VD25OH 44.8 04/01/2022   VD25OH 39.34 08/27/2021   VD25OH 67.5 06/20/2021   Lab Results  Component Value Date   WBC 4.8 01/29/2022   HGB 13.1 01/29/2022   HCT 39.5 01/29/2022   MCV 97.0 01/29/2022   PLT 284.0 01/29/2022   Lab  Results  Component Value Date   IRON 120 06/27/2010   FERRITIN 42.7 06/27/2010    Obesity Behavioral Intervention:   Approximately 15 minutes were spent on the discussion below.  ASK: We discussed the diagnosis of obesity with [name] today and [name] agreed to give [place] permission to discuss obesity behavioral modification therapy today.  ASSESS: [name] has the diagnosis of obesity and her BMI today is 34.8. [name] is in the action stage of change.   ADVISE: [name] was educated on the multiple health risks of obesity as well as the benefit of weight loss to improve her health. She was advised of the need for long term treatment and the importance of lifestyle modifications to improve her current health and to decrease her risk of future health problems.  AGREE: Multiple dietary modification options and treatment options were discussed and [name] agreed to follow the recommendations documented in the above note.  ARRANGE: [name] was educated on the importance of frequent visits to treat obesity as outlined per CMS and USPSTF guidelines and agreed to schedule her next follow up appointment today.  Attestation Statements:   Reviewed by clinician on day of visit: allergies, medications, problem list, medical history, surgical history, family history, social history, and previous encounter notes.  I, Fortino Sic, RMA am acting as transcriptionist for Courtney Likes, MD.  I have reviewed the above documentation for accuracy and completeness, and I agree with the above. - Courtney Likes, MD

## 2022-04-22 ENCOUNTER — Encounter (INDEPENDENT_AMBULATORY_CARE_PROVIDER_SITE_OTHER): Payer: Self-pay | Admitting: Nurse Practitioner

## 2022-04-22 ENCOUNTER — Ambulatory Visit (INDEPENDENT_AMBULATORY_CARE_PROVIDER_SITE_OTHER): Payer: Medicare Other | Admitting: Nurse Practitioner

## 2022-04-22 VITALS — BP 133/76 | HR 76 | Temp 97.9°F | Ht 62.0 in | Wt 190.0 lb

## 2022-04-22 DIAGNOSIS — K219 Gastro-esophageal reflux disease without esophagitis: Secondary | ICD-10-CM | POA: Diagnosis not present

## 2022-04-22 DIAGNOSIS — Z6834 Body mass index (BMI) 34.0-34.9, adult: Secondary | ICD-10-CM | POA: Diagnosis not present

## 2022-04-22 DIAGNOSIS — E669 Obesity, unspecified: Secondary | ICD-10-CM | POA: Diagnosis not present

## 2022-04-22 DIAGNOSIS — K912 Postsurgical malabsorption, not elsewhere classified: Secondary | ICD-10-CM

## 2022-04-23 NOTE — Progress Notes (Unsigned)
Chief Complaint:   OBESITY [name] is here to discuss her progress with her obesity treatment plan along with follow-up of her obesity related diagnoses. [name] is on following a lower carbohydrate, vegetable and lean protein rich diet plan and states she is following her eating plan approximately 60-75% of the time. [name] states she is exercising 0 minutes 0 times per week.  Today's visit was #: 10 Starting weight: 191 lbs Starting date: 02/20/2021 Today's weight: 190 lbs Today's date: 04/22/2022 Total lbs lost to date: 1 lb Total lbs lost since last in-office visit: 0  Interim History: [name] had Roux-en-Y gastric bypass with Dr. Cameron Proud in 2006. Her highest weight prior to surgery was 240 lbs and her nadir weight was 138-140 lbs. She is taking a Bariatric multivitamin, Vit B12, over the counter Calcium and Vit D3 2,000 IU. No iron. Following intermitted fasting 12-8 pm. She is drinking water and coffee daily, denies sugary drinks or carbonated drinks.  Subjective:   1. Gastroesophageal reflux disease, unspecified whether esophagitis present [name] is taking Nexium at 40 mg. Notes, if she eats certain thing, she feels a fullness in her chest, tachycardia, belching and will vomit a little and after a couple of times of vomiting she will feel better. She can not tell which foods make it worse except sugar or milk products. Her last EDG was 2019. Last EKG 07/27/21. denies any chest pain,shortness of breath or palpitations.  2. Postoperative malabsorption History of RNY.  See above note.    Assessment/Plan:   1. Gastroesophageal reflux disease, unspecified whether esophagitis present [name] is to schedule an appointment with GI and also with Dr. Cameron Proud. I offered to obtain EKG today.  2. Postoperative malabsorption We will obtain labs today. Will refer back to Dr. Cameron Proud.  - Ferritin - Folate - Vitamin B12 - Prealbumin - PTH, Intact and Calcium - Zinc -  Iron  3. Obesity with current BMI of 34.8 [name] is currently in the action stage of change. As such, her goal is to continue with weight loss efforts. She has agreed to the Category 1 Plan and keeping a food journal and adhering to recommended goals of 1000 calories and 75 grams of protein.   Handout given: Protein content of foods. We will obtain labs today.  Exercise goals: All adults should avoid inactivity. Some physical activity is better than none, and adults who participate in any amount of physical activity gain some health benefits.  Behavioral modification strategies: increasing water intake, no skipping meals, and keeping a strict food journal.  [name] has agreed to follow-up with our clinic in 2 weeks. She was informed of the importance of frequent follow-up visits to maximize her success with intensive lifestyle modifications for her multiple health conditions.   [name] was informed we would discuss her lab results at her next visit unless there is a critical issue that needs to be addressed sooner. [name] agreed to keep her next visit at the agreed upon time to discuss these results.  Objective:   Blood pressure 133/76, pulse 76, temperature 97.9 F (36.6 C), height 5\' 2"  (1.575 m), weight 190 lb (86.2 kg), SpO2 99 %. Body mass index is 34.75 kg/m.  General: Cooperative, alert, well developed, in no acute distress. HEENT: Conjunctivae and lids unremarkable. Cardiovascular: Regular rhythm.  Lungs: Normal work of breathing. Neurologic: No focal deficits.   Lab Results  Component Value Date   CREATININE 0.62 04/01/2022   BUN 11 04/01/2022   NA 139  04/01/2022   K 4.5 04/01/2022   CL 102 04/01/2022   CO2 21 04/01/2022   Lab Results  Component Value Date   ALT 21 04/01/2022   AST 23 04/01/2022   ALKPHOS 98 04/01/2022   BILITOT 0.4 04/01/2022   Lab Results  Component Value Date   HGBA1C 5.5 04/01/2022   HGBA1C 5.6 08/27/2021   HGBA1C 5.5 06/20/2021    HGBA1C 5.5 02/20/2021   HGBA1C 5.4 08/02/2019   Lab Results  Component Value Date   INSULIN 2.7 04/01/2022   INSULIN 6.5 06/20/2021   INSULIN 3.5 02/20/2021   INSULIN 3.0 02/18/2018   Lab Results  Component Value Date   TSH 1.17 08/27/2021   Lab Results  Component Value Date   CHOL 154 08/27/2021   HDL 83.60 08/27/2021   LDLCALC 60 08/27/2021   TRIG 50.0 08/27/2021   CHOLHDL 2 08/27/2021   Lab Results  Component Value Date   VD25OH 44.8 04/01/2022   VD25OH 39.34 08/27/2021   VD25OH 67.5 06/20/2021   Lab Results  Component Value Date   WBC 4.8 01/29/2022   HGB 13.1 01/29/2022   HCT 39.5 01/29/2022   MCV 97.0 01/29/2022   PLT 284.0 01/29/2022   Lab Results  Component Value Date   IRON 105 04/22/2022   FERRITIN 23 04/22/2022   Attestation Statements:   Reviewed by clinician on day of visit: allergies, medications, problem list, medical history, surgical history, family history, social history, and previous encounter notes.  I spent 30 minutes with the patient and prior to and after visit reviewing her chart.   I, Brendell Tyus, RMA, am acting as transcriptionist for Irene Limbo, FNP.  I have reviewed the above documentation for accuracy and completeness, and I agree with the above. Irene Limbo, FNP

## 2022-04-25 ENCOUNTER — Telehealth: Payer: Self-pay | Admitting: Family Medicine

## 2022-04-25 LAB — PTH, INTACT AND CALCIUM
Calcium: 9.7 mg/dL (ref 8.7–10.3)
PTH: 51 pg/mL (ref 15–65)

## 2022-04-25 LAB — ZINC: Zinc: 106 ug/dL (ref 44–115)

## 2022-04-25 LAB — VITAMIN B12: Vitamin B-12: 1298 pg/mL — ABNORMAL HIGH (ref 232–1245)

## 2022-04-25 LAB — FERRITIN: Ferritin: 23 ng/mL (ref 15–150)

## 2022-04-25 LAB — PREALBUMIN: PREALBUMIN: 14 mg/dL (ref 10–36)

## 2022-04-25 LAB — IRON: Iron: 105 ug/dL (ref 27–139)

## 2022-04-25 LAB — FOLATE: Folate: >20 ng/mL (ref >3.0)

## 2022-04-25 NOTE — Telephone Encounter (Signed)
Left message for patient to call back and schedule Medicare Annual Wellness Visit (AWV).   Please offer to do virtually or by telephone.  Left office number and my jabber #[phone].  Last AWV:05/01/2021  Please schedule at anytime with Nurse Health Advisor.   

## 2022-05-08 ENCOUNTER — Encounter (INDEPENDENT_AMBULATORY_CARE_PROVIDER_SITE_OTHER): Payer: Self-pay

## 2022-05-20 ENCOUNTER — Ambulatory Visit (INDEPENDENT_AMBULATORY_CARE_PROVIDER_SITE_OTHER): Payer: Medicare Other | Admitting: Nurse Practitioner

## 2022-05-21 ENCOUNTER — Telehealth: Payer: Self-pay | Admitting: Family Medicine

## 2022-05-21 DIAGNOSIS — M459 Ankylosing spondylitis of unspecified sites in spine: Secondary | ICD-10-CM

## 2022-05-21 NOTE — Telephone Encounter (Signed)
Pt stated she last saw her Rheumatologist in 2019 and needs a new referral again to see her for joint stiffness. She has seen Dr.Deveshwar.

## 2022-05-21 NOTE — Telephone Encounter (Signed)
Okay to place new referral? 

## 2022-05-21 NOTE — Telephone Encounter (Signed)
Referral placed.

## 2022-05-23 DIAGNOSIS — Z9884 Bariatric surgery status: Secondary | ICD-10-CM | POA: Diagnosis not present

## 2022-05-23 DIAGNOSIS — R1310 Dysphagia, unspecified: Secondary | ICD-10-CM | POA: Diagnosis not present

## 2022-05-23 DIAGNOSIS — Z713 Dietary counseling and surveillance: Secondary | ICD-10-CM | POA: Diagnosis not present

## 2022-05-23 DIAGNOSIS — K912 Postsurgical malabsorption, not elsewhere classified: Secondary | ICD-10-CM | POA: Diagnosis not present

## 2022-05-24 DIAGNOSIS — Z9884 Bariatric surgery status: Secondary | ICD-10-CM | POA: Diagnosis not present

## 2022-05-24 DIAGNOSIS — M25551 Pain in right hip: Secondary | ICD-10-CM | POA: Diagnosis not present

## 2022-05-24 DIAGNOSIS — M25561 Pain in right knee: Secondary | ICD-10-CM | POA: Diagnosis not present

## 2022-05-24 DIAGNOSIS — K912 Postsurgical malabsorption, not elsewhere classified: Secondary | ICD-10-CM | POA: Diagnosis not present

## 2022-05-24 DIAGNOSIS — M1711 Unilateral primary osteoarthritis, right knee: Secondary | ICD-10-CM | POA: Diagnosis not present

## 2022-05-24 DIAGNOSIS — Z96642 Presence of left artificial hip joint: Secondary | ICD-10-CM | POA: Diagnosis not present

## 2022-06-05 DIAGNOSIS — K219 Gastro-esophageal reflux disease without esophagitis: Secondary | ICD-10-CM | POA: Diagnosis not present

## 2022-06-05 DIAGNOSIS — R1310 Dysphagia, unspecified: Secondary | ICD-10-CM | POA: Diagnosis not present

## 2022-06-05 DIAGNOSIS — Z9889 Other specified postprocedural states: Secondary | ICD-10-CM | POA: Diagnosis not present

## 2022-06-17 ENCOUNTER — Ambulatory Visit (INDEPENDENT_AMBULATORY_CARE_PROVIDER_SITE_OTHER): Payer: Medicare Other

## 2022-06-17 VITALS — Ht 62.0 in | Wt 190.0 lb

## 2022-06-17 DIAGNOSIS — Z0000 Encounter for general adult medical examination without abnormal findings: Secondary | ICD-10-CM

## 2022-06-17 NOTE — Progress Notes (Signed)
Subjective:   [name] is a 66 y.o. female who presents for Medicare Annual (Subsequent) preventive examination.  I connected with Brucha today by telephone and verified that I am speaking with the correct person using two identifiers. Location patient: home Location provider: work Persons participating in the virtual visit: patient, Engineer, civil (consulting).    I discussed the limitations, risks, security and privacy concerns of performing an evaluation and management service by telephone and the availability of in person appointments. I also discussed with the patient that there may be a patient responsible charge related to this service. The patient expressed understanding and verbally consented to this telephonic visit.    Interactive audio and video telecommunications were attempted between this provider and patient, however failed, due to patient having technical difficulties OR patient did not have access to video capability.  We continued and completed visit with audio only.  Some vital signs may be absent or patient reported.   Time Spent with patient on telephone encounter: 20 minutes   Review of Systems     Cardiac Risk Factors include: hypertension;obesity (BMI >30kg/m2)     Objective:    Today's Vitals   06/17/22 0930  Weight: 190 lb (86.2 kg)  Height:  (1.575 m)   Body mass index is 34.75 kg/m.     06/17/2022    9:33 AM 07/25/2021   12:03 PM 05/01/2021    8:23 AM 04/06/2020    6:50 AM 06/08/2019    8:07 AM 02/09/2018    8:25 AM 03/07/2016    8:44 PM  Advanced Directives  Does Patient Have a Medical Advance Directive? No No No No No No No  Would patient like information on creating a medical advance directive?  Yes (ED - Information included in AVS) Yes (MAU/Ambulatory/Procedural Areas - Information given)  No - Patient declined Yes (MAU/Ambulatory/Procedural Areas - Information given) No - patient declined information    Current Medications (verified) Outpatient  Encounter Medications as of 06/17/2022  Medication Sig   Biotin w/ Vitamins C & E (HAIR SKIN & NAILS GUMMIES PO) Take 1 each by mouth daily.   Calcium Carb-Cholecalciferol (CALTRATE 600+D3 SOFT PO) Take by mouth.   esomeprazole (NEXIUM) 40 MG capsule Take 1 capsule (40 mg total) by mouth daily as needed. Crack open capsule and ingest contents. Please keep your January appointment for further refills. Thank you   fluticasone (FLONASE) 50 MCG/ACT nasal spray Use 2 spray(s) in each nostril once daily   vitamin B-12 (CYANOCOBALAMIN) 1000 MCG tablet Take 1,000 mcg by mouth daily.   amLODipine (NORVASC) 5 MG tablet Take 1 tablet by mouth once daily (Patient not taking: Reported on 06/17/2022)   No facility-administered encounter medications on file as of 06/17/2022.    Allergies (verified) Aspirin, Codeine, Morphine, Sulfa antibiotics, and Sulfonamide derivatives   History: Past Medical History:  Diagnosis Date   Abdominal pain, other specified site 03/31/2008   Centricity Description: FLANK PAIN, RIGHT Qualifier: Diagnosis of  By: Alwyn Ren MD, Chrissie Noa   Centricity Description: ABDOMINAL PAIN OTHER SPECIFIED SITE Qualifier: Diagnosis of  By: Janit Bern     Acute sprain or strain of cervical region 04/10/2012   ALLERGIC RHINITIS 06/26/2007   Qualifier: Diagnosis of  By: Charlsie Quest RMA, Lucy     ANKYLOSING SPONDYLITIS 03/31/2008   Qualifier: Diagnosis of  By: Alwyn Ren MD, Chrissie Noa     Ankylosing spondylitis (HCC)    neck and spin   Ankylosing spondylitis of multiple sites in spine (HCC)  Arthritis    Back pain    Bariatric surgery status 01/14/2008   Qualifier: Diagnosis of  By: Laury Axon DO, Myrene Buddy     Blood in stool 12/02/2008   Qualifier: Diagnosis of  By: Janit Bern     Blood transfusion without reported diagnosis    Calf pain 10/13/2012   CHEST PAIN UNSPECIFIED 10/11/2009   Qualifier: Diagnosis of  By: Arlyce Dice MD, Barbette Hair    CONSTIPATION 04/06/2010   Qualifier: Diagnosis of  By: Janit Bern      CORNEAL ABRASION, LEFT 01/22/2010   Qualifier: Diagnosis of  By: Janit Bern     Gastritis    GERD 06/26/2007   Qualifier: Diagnosis of  By: Charlsie Quest RMA, Lucy     GERD (gastroesophageal reflux disease)    Hiatal hernia    Hypertension    no medications now   IBS 06/26/2007   Qualifier: Diagnosis of  By: Charlsie Quest RMA, Lucy     Joint pain    Lactose intolerance    LEG CRAMPS, NOCTURNAL 11/14/2010   Qualifier: Diagnosis of  By: Floydene Flock     Long-term current use of steroids 04/20/2013   LYMPHADENOPATHY 06/10/2007   Qualifier: Diagnosis of  By: Janit Bern     MORBID OBESITY 06/26/2007   Qualifier: Diagnosis of  By: Samara Snide     MUSCLE PAIN 03/31/2008   Qualifier: Diagnosis of  By: Alwyn Ren MD, William     Noninfectious gastroenteritis and colitis 03/17/2013   Obesity (BMI 30-39.9) 03/16/2014   OSA (obstructive sleep apnea) 03/08/2016   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/23/2010   Qualifier: Diagnosis of  By: Janit Bern     Rheumatoid arthritis (HCC)    Seasonal allergies 05/14/2015   THYROID CYST 06/26/2007   Qualifier: Diagnosis of  By: Charlsie Quest RMA, Lucy     THYROID NODULE, HX OF 06/10/2007   Qualifier: Diagnosis of  By: Janit Bern     Vitamin D deficiency    Past Surgical History:  Procedure Laterality Date   BARIATRIC SURGERY     BREAST REDUCTION SURGERY  1996   COLONOSCOPY  10/24/2009   kaplan-normal exam   ESOPHAGOGASTRODUODENOSCOPY     pelvis replacement  2010   TOTAL HIP ARTHROPLASTY     x 2 left, one revision   UPPER GASTROINTESTINAL ENDOSCOPY     Family History  Problem Relation Age of Onset   Hypertension Mother    High Cholesterol Mother    Diabetes Mother    Arthritis Father    Hypertension Father    Diabetes Father    High Cholesterol Father    Melanoma Maternal Grandmother    Breast cancer Maternal Grandmother        and Aunt   Hypertension Sister    Diabetes Sister    Colon cancer Neg Hx    Esophageal cancer Neg Hx     Rectal cancer Neg Hx    Stomach cancer Neg Hx    Colon polyps Neg Hx    Social History   Socioeconomic History   Marital status: Married    Spouse name: Zuley Lutter   Number of children: Not on file   Years of education: Not on file   Highest education level: Not on file  Occupational History   Occupation: retired  Tobacco Use   Smoking status: Never   Smokeless tobacco: Never  Vaping Use   Vaping Use: Never used  Substance and Sexual  Activity   Alcohol use: No    Alcohol/week: 0.0 standard drinks of alcohol   Drug use: No   Sexual activity: Yes  Other Topics Concern   Not on file  Social History Narrative   Occupation: Production manager   Daily Caffeine use- 1 cup daily      Social Determinants of Health   Financial Resource Strain: Low Risk  (05/01/2021)   Overall Financial Resource Strain (CARDIA)    Difficulty of Paying Living Expenses: Not hard at all  Food Insecurity: No Food Insecurity (06/17/2022)   Hunger Vital Sign    Worried About Running Out of Food in the Last Year: Never true    Ran Out of Food in the Last Year: Never true  Transportation Needs: No Transportation Needs (06/17/2022)   PRAPARE - Hydrologist (Medical): No    Lack of Transportation (Non-Medical): No  Physical Activity: Inactive (06/17/2022)   Exercise Vital Sign    Days of Exercise per Week: 0 days    Minutes of Exercise per Session: 0 min  Stress: No Stress Concern Present (06/17/2022)   [place]    Feeling of Stress : Not at all  Social Connections: Moderately Integrated (06/17/2022)   Social Connection and Isolation Panel [NHANES]    Frequency of Communication with Friends and Family: More than three times a week    Frequency of Social Gatherings with Friends and Family: More than three times a week    Attends Religious Services: More than 4 times per year    Active Member of [employer] or  Organizations: No    Attends Music therapist: Never    Marital Status: Married    Tobacco Counseling Counseling given: Not Answered   Clinical Intake:  Pre-visit preparation completed: Yes  Pain : No/denies pain     BMI - recorded: 34.75 Nutritional Status: BMI > 30  Obese Nutritional Risks: None Diabetes: No  How often do you need to have someone help you when you read instructions, pamphlets, or other written materials from your doctor or pharmacy?: 1 - Never  Diabetic?No  Interpreter Needed?: No  Information entered by :: Caroleen Hamman LPN   Activities of Daily Living    06/17/2022    9:36 AM  In your present state of health, do you have any difficulty performing the following activities:  Hearing? 0  Vision? 0  Difficulty concentrating or making decisions? 1  Comment occasionally  Walking or climbing stairs? 0  Dressing or bathing? 0  Doing errands, shopping? 0  Preparing Food and eating ? N  Using the Toilet? N  In the past six months, have you accidently leaked urine? N  Do you have problems with loss of bowel control? N  Managing your Medications? N  Managing your Finances? N  Housekeeping or managing your Housekeeping? N    Patient Care Team: Carollee Herter, Alferd Apa, DO as PCP - General Louretta Shorten, MD as Consulting Physician (Obstetrics and Gynecology) Andria Frames, Shanon Brow, MD as Referring Physician (Surgery) Bo Merino, MD as Consulting Physician (Rheumatology) Jerline Pain, MD as Consulting Physician (Cardiology) Armbruster, Carlota Raspberry, MD as Consulting Physician (Gastroenterology) Philemon Kingdom, MD as Consulting Physician (Endocrinology) Phipps, C. Shanon Brow, MD (Otolaryngology) Paralee Cancel, MD as Consulting Physician (Orthopedic Surgery) Deneise Lever, MD as Consulting Physician (Pulmonary Disease)  Indicate any recent Medical Services you may have received from other than [facility] providers in the past  year (date may be  approximate).     Assessment:   This is a routine wellness examination for [place].  Hearing/Vision screen Hearing Screening - Comments:: No issues Vision Screening - Comments:: Last eye exam-10/2020-[facility]  Dietary issues and exercise activities discussed: Current Exercise Habits: The patient does not participate in regular exercise at present   Goals Addressed             This Visit's Progress    Drink at least 6 glasses of water per day.   On track    Increase physical activity   On track    Work out 3x/ week       Depression Screen    06/17/2022    9:35 AM 05/01/2021    8:25 AM 02/20/2021    6:57 AM 02/29/2020   10:17 AM 06/08/2019    8:08 AM 02/18/2018   10:13 AM 02/09/2018    8:25 AM  PHQ 2/9 Scores  PHQ - 2 Score 0 0 0 3 0 1 0  PHQ- 9 Score   6 11  8    Exception Documentation      Medical reason     Fall Risk    06/17/2022    9:34 AM 05/01/2021    8:24 AM 06/08/2019    8:08 AM 02/09/2018    8:25 AM 03/05/2016    9:43 AM  Fall Risk   Falls in the past year? 0 0 1 Yes Yes  Comment     Fell down steps and tripped over raised area on the road.   Number falls in past yr: 0 0 1 2 or more 2 or more  Injury with Fall? 0 0 0 No No  Risk for fall due to :    History of fall(s) Impaired balance/gait;History of fall(s)  Risk for fall due to: Comment   slipped x2    Follow up Falls prevention discussed Falls prevention discussed Education provided;Falls prevention discussed Education provided;Falls prevention discussed Education provided;Falls prevention discussed    FALL RISK PREVENTION PERTAINING TO THE HOME:  Any stairs in or around the home? Yes  If so, are there any without handrails? No  Home free of loose throw rugs in walkways, pet beds, electrical cords, etc? Yes  Adequate lighting in your home to reduce risk of falls? Yes   ASSISTIVE DEVICES UTILIZED TO PREVENT FALLS:  Life alert? No  Use of a cane, walker or w/c? Yes cane occasionally Grab bars in  the bathroom? No  Shower chair or bench in shower? No  Elevated toilet seat or a handicapped toilet? No   TIMED UP AND GO:  Was the test performed? No . Phone visit   Cognitive Function:    03/05/2016   10:05 AM  MMSE - Mini Mental State Exam  Orientation to time 5  Orientation to Place 5  Registration 3  Attention/ Calculation 5  Recall 3  Language- name 2 objects 2  Language- repeat 1  Language- follow 3 step command 3  Language- read & follow direction 1  Write a sentence 1  Copy design 1  Total score 30        06/17/2022    9:40 AM 06/08/2019    8:13 AM  6CIT Screen  What Year? 0 points 0 points  What month? 0 points 0 points  What time? 0 points 0 points  Count back from 20 0 points 0 points  Months in reverse 0 points 0 points  Repeat phrase 0 points 4 points  Total Score 0 points 4 points    Immunizations Immunization History  Administered Date(s) Administered   Influenza Whole 06/22/2010   PFIZER(Purple Top)SARS-COV-2 Vaccination 11/12/2019, 12/07/2019, 09/14/2020   Pfizer Covid-19 Vaccine Bivalent Booster 23yrs & up 10/05/2021   Tdap 03/16/2014    TDAP status: Up to date  Flu Vaccine status: Declined, Education has been provided regarding the importance of this vaccine but patient still declined. Advised may receive this vaccine at local pharmacy or Health Dept. Aware to provide a copy of the vaccination record if obtained from local pharmacy or Health Dept. Verbalized acceptance and understanding.  Pneumococcal vaccine status: Due at age 70  Covid-19 vaccine status: Information provided on how to obtain vaccines.   Qualifies for Shingles Vaccine? Yes   Zostavax completed No   Shingrix Completed?: No.    Education has been provided regarding the importance of this vaccine. Patient has been advised to call insurance company to determine out of pocket expense if they have not yet received this vaccine. Advised may also receive vaccine at local pharmacy  or Health Dept. Verbalized acceptance and understanding.  Screening Tests Health Maintenance  Topic Date Due   Zoster Vaccines- Shingrix (1 of 2) Never done   PAP SMEAR-Modifier  09/30/2021   INFLUENZA VACCINE  04/30/2022   MAMMOGRAM  10/31/2023   TETANUS/TDAP  03/16/2024   COLONOSCOPY (Pts 45-41yrs Insurance coverage will need to be confirmed)  04/13/2030   COVID-19 Vaccine  Completed   Hepatitis C Screening  Completed   HIV Screening  Completed   HPV VACCINES  Aged Out    Health Maintenance  Health Maintenance Due  Topic Date Due   Zoster Vaccines- Shingrix (1 of 2) Never done   PAP SMEAR-Modifier  09/30/2021   INFLUENZA VACCINE  04/30/2022    Colorectal cancer screening: Type of screening: Colonoscopy. Completed 04/13/2020. Repeat every 10 years  Mammogram status: Completed bilateral 10/2021-per patient. Repeat every year  Bone Density status: Due at age 13  Lung Cancer Screening: (Low Dose CT Chest recommended if Age 53-80 years, 30 pack-year currently smoking OR have quit w/in 15years.) does not qualify.     Additional Screening:  Hepatitis C Screening: Completed 11/17/2017  Vision Screening: Recommended annual ophthalmology exams for early detection of glaucoma and other disorders of the eye. Is the patient up to date with their annual eye exam?  No  Who is the provider or what is the name of the office in which the patient attends annual eye exams? [facility]   Dental Screening: Recommended annual dental exams for proper oral hygiene  Community Resource Referral / Chronic Care Management: CRR required this visit?  No   CCM required this visit?  No      Plan:     I have personally reviewed and noted the following in the patient's chart:   Medical and social history Use of alcohol, tobacco or illicit drugs  Current medications and supplements including opioid prescriptions. Patient is not currently taking opioid prescriptions. Functional ability and  status Nutritional status Physical activity Advanced directives List of other physicians Hospitalizations, surgeries, and ER visits in previous 12 months Vitals Screenings to include cognitive, depression, and falls Referrals and appointments  In addition, I have reviewed and discussed with patient certain preventive protocols, quality metrics, and best practice recommendations. A written personalized care plan for preventive services as well as general preventive health recommendations were provided to patient.   Due to this  being a telephonic visit, the after visit summary with patients personalized plan was offered to patient via mail or my-chart. Patient would like to access on my-chart.    Roanna RaiderMartha A Brandice Busser, LPN   1/61/09609/18/2023  Nurse Health Advisor  Nurse Notes: None

## 2022-06-17 NOTE — Patient Instructions (Signed)
[name] , Thank you for taking time to complete your Medicare Wellness Visit. I appreciate your ongoing commitment to your health goals. Please review the following plan we discussed and let me know if I can assist you in the future.   Screening recommendations/referrals: Colonoscopy: Completed 04/13/2020-Due 04/13/2030 Mammogram: Per our conversation completed 10/2021-Due 10/2022 Bone Density: Due at age 66 Recommended yearly ophthalmology/optometry visit for glaucoma screening and checkup Recommended yearly dental visit for hygiene and checkup  Vaccinations: Influenza vaccine: Declined Pneumococcal vaccine: Due at age 13 Tdap vaccine: Up to date Shingles vaccine: Due-May obtain vaccine at your local pharmacy. Covid-19:Up to date  Advanced directives: May pick up information at your next visit  Conditions/risks identified: See problem list  Next appointment: Follow up in one year for your annual wellness visit    Preventive Care 65 Years and Older, Female Preventive care refers to lifestyle choices and visits with your health care provider that can promote health and wellness. What does preventive care include? A yearly physical exam. This is also called an annual well check. Dental exams once or twice a year. Routine eye exams. Ask your health care provider how often you should have your eyes checked. Personal lifestyle choices, including: Daily care of your teeth and gums. Regular physical activity. Eating a healthy diet. Avoiding tobacco and drug use. Limiting alcohol use. Practicing safe sex. Taking low-dose aspirin every day. Taking vitamin and mineral supplements as recommended by your health care provider. What happens during an annual well check? The services and screenings done by your health care provider during your annual well check will depend on your age, overall health, lifestyle risk factors, and family history of disease. Counseling  Your health care provider  may ask you questions about your: Alcohol use. Tobacco use. Drug use. Emotional well-being. Home and relationship well-being. Sexual activity. Eating habits. History of falls. Memory and ability to understand (cognition). Work and work Statistician. Reproductive health. Screening  You may have the following tests or measurements: Height, weight, and BMI. Blood pressure. Lipid and cholesterol levels. These may be checked every 5 years, or more frequently if you are over 79 years old. Skin check. Lung cancer screening. You may have this screening every year starting at age 103 if you have a 30-pack-year history of smoking and currently smoke or have quit within the past 15 years. Fecal occult blood test (FOBT) of the stool. You may have this test every year starting at age 36. Flexible sigmoidoscopy or colonoscopy. You may have a sigmoidoscopy every 5 years or a colonoscopy every 10 years starting at age 47. Hepatitis C blood test. Hepatitis B blood test. Sexually transmitted disease (STD) testing. Diabetes screening. This is done by checking your blood sugar (glucose) after you have not eaten for a while (fasting). You may have this done every 1-3 years. Bone density scan. This is done to screen for osteoporosis. You may have this done starting at age 106. Mammogram. This may be done every 1-2 years. Talk to your health care provider about how often you should have regular mammograms. Talk with your health care provider about your test results, treatment options, and if necessary, the need for more tests. Vaccines  Your health care provider may recommend certain vaccines, such as: Influenza vaccine. This is recommended every year. Tetanus, diphtheria, and acellular pertussis (Tdap, Td) vaccine. You may need a Td booster every 10 years. Zoster vaccine. You may need this after age 27. Pneumococcal 13-valent conjugate (PCV13) vaccine. One  dose is recommended after age 22. Pneumococcal  polysaccharide (PPSV23) vaccine. One dose is recommended after age 42. Talk to your health care provider about which screenings and vaccines you need and how often you need them. This information is not intended to replace advice given to you by your health care provider. Make sure you discuss any questions you have with your health care provider. Document Released: 10/13/2015 Document Revised: 06/05/2016 Document Reviewed: 07/18/2015 Elsevier Interactive Patient Education  2017 [employer] Prevention in the Home Falls can cause injuries. They can happen to people of all ages. There are many things you can do to make your home safe and to help prevent falls. What can I do on the outside of my home? Regularly fix the edges of walkways and driveways and fix any cracks. Remove anything that might make you trip as you walk through a door, such as a raised step or threshold. Trim any bushes or trees on the path to your home. Use bright outdoor lighting. Clear any walking paths of anything that might make someone trip, such as rocks or tools. Regularly check to see if handrails are loose or broken. Make sure that both sides of any steps have handrails. Any raised decks and porches should have guardrails on the edges. Have any leaves, snow, or ice cleared regularly. Use sand or salt on walking paths during winter. Clean up any spills in your garage right away. This includes oil or grease spills. What can I do in the bathroom? Use night lights. Install grab bars by the toilet and in the tub and shower. Do not use towel bars as grab bars. Use non-skid mats or decals in the tub or shower. If you need to sit down in the shower, use a plastic, non-slip stool. Keep the floor dry. Clean up any water that spills on the floor as soon as it happens. Remove soap buildup in the tub or shower regularly. Attach bath mats securely with double-sided non-slip rug tape. Do not have throw rugs and other  things on the floor that can make you trip. What can I do in the bedroom? Use night lights. Make sure that you have a light by your bed that is easy to reach. Do not use any sheets or blankets that are too big for your bed. They should not hang down onto the floor. Have a firm chair that has side arms. You can use this for support while you get dressed. Do not have throw rugs and other things on the floor that can make you trip. What can I do in the kitchen? Clean up any spills right away. Avoid walking on wet floors. Keep items that you use a lot in easy-to-reach places. If you need to reach something above you, use a strong step stool that has a grab bar. Keep electrical cords out of the way. Do not use floor polish or wax that makes floors slippery. If you must use wax, use non-skid floor wax. Do not have throw rugs and other things on the floor that can make you trip. What can I do with my stairs? Do not leave any items on the stairs. Make sure that there are handrails on both sides of the stairs and use them. Fix handrails that are broken or loose. Make sure that handrails are as long as the stairways. Check any carpeting to make sure that it is firmly attached to the stairs. Fix any carpet that is loose or worn.  Avoid having throw rugs at the top or bottom of the stairs. If you do have throw rugs, attach them to the floor with carpet tape. Make sure that you have a light switch at the top of the stairs and the bottom of the stairs. If you do not have them, ask someone to add them for you. What else can I do to help prevent falls? Wear shoes that: Do not have high heels. Have rubber bottoms. Are comfortable and fit you well. Are closed at the toe. Do not wear sandals. If you use a stepladder: Make sure that it is fully opened. Do not climb a closed stepladder. Make sure that both sides of the stepladder are locked into place. Ask someone to hold it for you, if possible. Clearly  mark and make sure that you can see: Any grab bars or handrails. First and last steps. Where the edge of each step is. Use tools that help you move around (mobility aids) if they are needed. These include: Canes. Walkers. Scooters. Crutches. Turn on the lights when you go into a dark area. Replace any light bulbs as soon as they burn out. Set up your furniture so you have a clear path. Avoid moving your furniture around. If any of your floors are uneven, fix them. If there are any pets around you, be aware of where they are. Review your medicines with your doctor. Some medicines can make you feel dizzy. This can increase your chance of falling. Ask your doctor what other things that you can do to help prevent falls. This information is not intended to replace advice given to you by your health care provider. Make sure you discuss any questions you have with your health care provider. Document Released: 07/13/2009 Document Revised: 02/22/2016 Document Reviewed: 10/21/2014 Elsevier Interactive Patient Education  2017 [employer].

## 2022-07-04 ENCOUNTER — Ambulatory Visit (HOSPITAL_COMMUNITY)
Admission: RE | Admit: 2022-07-04 | Discharge: 2022-07-04 | Disposition: A | Discharge status: Home / Self Care | Payer: Medicare Other | Source: Ambulatory Visit | Attending: Cardiology | Admitting: Cardiology

## 2022-07-04 ENCOUNTER — Other Ambulatory Visit (HOSPITAL_COMMUNITY): Payer: Self-pay | Admitting: Sports Medicine

## 2022-07-04 DIAGNOSIS — M79605 Pain in left leg: Secondary | ICD-10-CM | POA: Diagnosis not present

## 2022-07-04 DIAGNOSIS — M7989 Other specified soft tissue disorders: Secondary | ICD-10-CM | POA: Insufficient documentation

## 2022-07-04 DIAGNOSIS — M79661 Pain in right lower leg: Secondary | ICD-10-CM | POA: Insufficient documentation

## 2022-07-04 DIAGNOSIS — M79604 Pain in right leg: Secondary | ICD-10-CM | POA: Diagnosis not present

## 2022-07-04 DIAGNOSIS — M25561 Pain in right knee: Secondary | ICD-10-CM | POA: Diagnosis not present

## 2022-07-05 DIAGNOSIS — M25561 Pain in right knee: Secondary | ICD-10-CM | POA: Diagnosis not present

## 2022-07-05 DIAGNOSIS — M1711 Unilateral primary osteoarthritis, right knee: Secondary | ICD-10-CM | POA: Diagnosis not present

## 2022-07-05 DIAGNOSIS — Z9884 Bariatric surgery status: Secondary | ICD-10-CM | POA: Diagnosis not present

## 2022-07-05 DIAGNOSIS — Z713 Dietary counseling and surveillance: Secondary | ICD-10-CM | POA: Diagnosis not present

## 2022-08-15 DIAGNOSIS — M1711 Unilateral primary osteoarthritis, right knee: Secondary | ICD-10-CM | POA: Diagnosis not present

## 2022-08-15 DIAGNOSIS — M25561 Pain in right knee: Secondary | ICD-10-CM | POA: Diagnosis not present

## 2022-08-21 ENCOUNTER — Telehealth: Payer: Self-pay

## 2022-08-21 NOTE — Telephone Encounter (Signed)
Called patient and informed her of my call. Patient asked to be scheduled with Dr. Anne Fu. Mrs. Kim is scheduled to see Dr. Anne Fu on 09/13/22 at 8:20 AM. Patient verbalized understanding and all (if any) questions were answered.

## 2022-08-21 NOTE — Telephone Encounter (Signed)
..     Pre-operative Risk Assessment    Patient Name: [name]  DOB: December 24, 1955 MRN: [id number]      Request for Surgical Clearance    Procedure:   right total knee  arthroplasty  Date of Surgery:  Clearance 10/15/21                                 Surgeon:  dr Durene Romans Surgeon's Group or Practice Name:  [facility] Phone number:  [phone] Fax number:  [phone]   Type of Clearance Requested:   - Medical    Type of Anesthesia:  Spinal   Additional requests/questions:    SignedRenee Ramus   08/21/2022, 9:42 AM

## 2022-08-21 NOTE — Telephone Encounter (Signed)
   Name: [name]  DOB: 1956/02/06  MRN: [id number]  Primary Cardiologist: Dr. Anne Fu  Chart reviewed as part of pre-operative protocol coverage. Patient has not been seen by our office since 09/2017. Therefore,  she will require a follow-up in-office visit in order to better assess preoperative cardiovascular risk. Given she has not been seen in over 4 years, this would be considered a New Patient Visit and should be with a MD if possible.   Pre-op covering staff: - Please schedule appointment and call patient to inform them. If patient already had an upcoming appointment within acceptable timeframe, please add "pre-op clearance" to the appointment notes so provider is aware. - Please contact requesting surgeon's office via preferred method (i.e, phone, fax) to inform them of need for appointment prior to surgery.  Will remove from pre-op pool.   Corrin Parker, PA-C  08/21/2022, 12:27 PM

## 2022-08-30 ENCOUNTER — Encounter: Payer: Medicare Other | Admitting: Family Medicine

## 2022-09-03 ENCOUNTER — Other Ambulatory Visit: Payer: Self-pay

## 2022-09-03 DIAGNOSIS — H20022 Recurrent acute iridocyclitis, left eye: Secondary | ICD-10-CM | POA: Diagnosis not present

## 2022-09-03 MED ORDER — COVID-19 MRNA 2023-2024 VACCINE (COMIRNATY) 0.3 ML INJECTION
0.3000 mL | Freq: Once | INTRAMUSCULAR | 0 refills | Status: AC
  Filled 2022-09-03: qty 0.3, 1d supply, fill #0

## 2022-09-13 ENCOUNTER — Encounter: Payer: Self-pay | Admitting: Cardiology

## 2022-09-13 ENCOUNTER — Ambulatory Visit: Payer: Medicare Other | Attending: Cardiology | Admitting: Cardiology

## 2022-09-13 VITALS — BP 140/90 | HR 66 | Ht 62.0 in | Wt 191.0 lb

## 2022-09-13 DIAGNOSIS — I10 Essential (primary) hypertension: Secondary | ICD-10-CM | POA: Diagnosis not present

## 2022-09-13 DIAGNOSIS — R072 Precordial pain: Secondary | ICD-10-CM

## 2022-09-13 DIAGNOSIS — Z01818 Encounter for other preprocedural examination: Secondary | ICD-10-CM | POA: Diagnosis not present

## 2022-09-13 DIAGNOSIS — M450 Ankylosing spondylitis of multiple sites in spine: Secondary | ICD-10-CM | POA: Diagnosis not present

## 2022-09-13 LAB — BASIC METABOLIC PANEL
BUN/Creatinine Ratio: 15 (ref 12–28)
BUN: 10 mg/dL (ref 8–27)
CO2: 25 mmol/L (ref 20–29)
Calcium: 9.6 mg/dL (ref 8.7–10.3)
Chloride: 105 mmol/L (ref 96–106)
Creatinine, Ser: 0.67 mg/dL (ref 0.57–1.00)
Glucose: 95 mg/dL (ref 70–99)
Potassium: 4.7 mmol/L (ref 3.5–5.2)
Sodium: 142 mmol/L (ref 134–144)
eGFR: 98 mL/min/{1.73_m2} (ref >59)

## 2022-09-13 MED ORDER — METOPROLOL TARTRATE 50 MG PO TABS: 50.0000 mg | ORAL_TABLET | ORAL | 0 refills | Status: DC

## 2022-09-13 NOTE — Progress Notes (Signed)
Cardiology Office Note:    Date:  09/13/2022   ID:  [name], DOB [birth year], MRN [id number]  PCP:  Zola Button, Grayling Congress, DO   [facility] Providers Cardiologist:  None     Referring MD: Zola Button, Grayling Congress, *    History of Present Illness:    [name] is a 66 y.o. female here for preoperative evaluation at the request of Dr. Laury Axon for knee surgery.  Previously saw her over 3 years ago with central chest pain back pain upper back no trigger had occasional palpitations as well.  She has had prior gastric bypass and sometimes has GI upset.  Went to ER with chest pain again. Starts with palpitations and racing. Pain in left arm. Front to back radiation. If Nexium some relief. No SOB, no syncope. No   Past medical history includes: 1. Ankylosing spondylitis: Follows with a rheumatologist.  History of hip/pelvis replacement in 2010.  2. Echo 2005: EF 55-65%, normal. 3. GERD 4. H/o gastric bypass 5. Atypical chest pain.    She is on disability, non-smoker married in [place]  Past Medical History:  Diagnosis Date   Abdominal pain, other specified site 03/31/2008   Centricity Description: FLANK PAIN, RIGHT Qualifier: Diagnosis of  By: Alwyn Ren MD, Chrissie Noa   Centricity Description: ABDOMINAL PAIN OTHER SPECIFIED SITE Qualifier: Diagnosis of  By: Janit Bern     Acute sprain or strain of cervical region 04/10/2012   ALLERGIC RHINITIS 06/26/2007   Qualifier: Diagnosis of  By: Charlsie Quest RMA, Lucy     ANKYLOSING SPONDYLITIS 03/31/2008   Qualifier: Diagnosis of  By: Alwyn Ren MD, William     Ankylosing spondylitis (HCC)    neck and spin   Ankylosing spondylitis of multiple sites in spine [facility])    Arthritis    Back pain    Bariatric surgery status 01/14/2008   Qualifier: Diagnosis of  By: Janit Bern     Blood in stool 12/02/2008   Qualifier: Diagnosis of  By: Janit Bern     Blood transfusion without reported diagnosis    Calf pain 10/13/2012   CHEST PAIN  UNSPECIFIED 10/11/2009   Qualifier: Diagnosis of  By: Arlyce Dice MD, Barbette Hair    CONSTIPATION 04/06/2010   Qualifier: Diagnosis of  By: Janit Bern     CORNEAL ABRASION, LEFT 01/22/2010   Qualifier: Diagnosis of  By: Janit Bern     Gastritis    GERD 06/26/2007   Qualifier: Diagnosis of  By: Charlsie Quest RMA, Lucy     GERD (gastroesophageal reflux disease)    Hiatal hernia    Hypertension    no medications now   IBS 06/26/2007   Qualifier: Diagnosis of  By: Charlsie Quest RMA, Lucy     Joint pain    Lactose intolerance    LEG CRAMPS, NOCTURNAL 11/14/2010   Qualifier: Diagnosis of  By: Floydene Flock     Long-term current use of steroids 04/20/2013   LYMPHADENOPATHY 06/10/2007   Qualifier: Diagnosis of  By: Janit Bern     MORBID OBESITY 06/26/2007   Qualifier: Diagnosis of  By: Samara Snide     MUSCLE PAIN 03/31/2008   Qualifier: Diagnosis of  By: Alwyn Ren MD, William     Noninfectious gastroenteritis and colitis 03/17/2013   Obesity (BMI 30-39.9) 03/16/2014   OSA (obstructive sleep apnea) 03/08/2016   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/23/2010   Qualifier: Diagnosis of  By: Janit Bern  Rheumatoid arthritis (HCC)    Seasonal allergies 05/14/2015   THYROID CYST 06/26/2007   Qualifier: Diagnosis of  By: Charlsie QuestBrand RMA, Lucy     THYROID NODULE, HX OF 06/10/2007   Qualifier: Diagnosis of  By: Janit BernLowne DO, Yvonne     Vitamin D deficiency     Past Surgical History:  Procedure Laterality Date   BARIATRIC SURGERY     BREAST REDUCTION SURGERY  1996   COLONOSCOPY  10/24/2009   kaplan-normal exam   ESOPHAGOGASTRODUODENOSCOPY     pelvis replacement  2010   TOTAL HIP ARTHROPLASTY     x 2 left, one revision   UPPER GASTROINTESTINAL ENDOSCOPY      Current Medications: Current Meds  Medication Sig   Biotin w/ Vitamins C & E (HAIR SKIN & NAILS GUMMIES PO) Take 1 each by mouth daily.   Calcium Carb-Cholecalciferol (CALTRATE 600+D3 SOFT PO) Take by mouth.   esomeprazole (NEXIUM) 40 MG  capsule Take 1 capsule (40 mg total) by mouth daily as needed. Crack open capsule and ingest contents. Please keep your January appointment for further refills. Thank you   fluticasone (FLONASE) 50 MCG/ACT nasal spray Use 2 spray(s) in each nostril once daily   metoprolol tartrate (LOPRESSOR) 50 MG tablet Take 1 tablet (50 mg total) by mouth as directed. Take one tablet 2 hours before your CT scan   vitamin B-12 (CYANOCOBALAMIN) 1000 MCG tablet Take 1,000 mcg by mouth daily.     Allergies:   Aspirin, Codeine, Morphine, Sulfa antibiotics, and Sulfonamide derivatives   Social History   Socioeconomic History   Marital status: Married    Spouse name: Yehuda Maondre Gaona   Number of children: Not on file   Years of education: Not on file   Highest education level: Not on file  Occupational History   Occupation: retired  Tobacco Use   Smoking status: Never   Smokeless tobacco: Never  Vaping Use   Vaping Use: Never used  Substance and Sexual Activity   Alcohol use: No    Alcohol/week: 0.0 standard drinks of alcohol   Drug use: No   Sexual activity: Yes  Other Topics Concern   Not on file  Social History Narrative   Occupation: Sports coachQuality Consultant   Daily Caffeine use- 1 cup daily      Social Determinants of Health   Financial Resource Strain: Low Risk  (05/01/2021)   Overall Financial Resource Strain (CARDIA)    Difficulty of Paying Living Expenses: Not hard at all  Food Insecurity: No Food Insecurity (06/17/2022)   Hunger Vital Sign    Worried About Running Out of Food in the Last Year: Never true    Ran Out of Food in the Last Year: Never true  Transportation Needs: No Transportation Needs (06/17/2022)   PRAPARE - Administrator, Civil ServiceTransportation    Lack of Transportation (Medical): No    Lack of Transportation (Non-Medical): No  Physical Activity: Inactive (06/17/2022)   Exercise Vital Sign    Days of Exercise per Week: 0 days    Minutes of Exercise per Session: 0 min  Stress: No Stress Concern  Present (06/17/2022)   [employer] - Occupational Stress Questionnaire    Feeling of Stress : Not at all  Social Connections: Moderately Integrated (06/17/2022)   Social Connection and Isolation Panel [NHANES]    Frequency of Communication with Friends and Family: More than three times a week    Frequency of Social Gatherings with Friends and Family: More than three times  a week    Attends Religious Services: More than 4 times per year    Active Member of Clubs or Organizations: No    Attends Banker Meetings: Never    Marital Status: Married     Family History: The patient's family history includes Arthritis in her father; Breast cancer in her maternal grandmother; Diabetes in her father, mother, and sister; High Cholesterol in her father and mother; Hypertension in her father, mother, and sister; Melanoma in her maternal grandmother. There is no history of Colon cancer, Esophageal cancer, Rectal cancer, Stomach cancer, or Colon polyps.  ROS:   Please see the history of present illness.     All other systems reviewed and are negative.  EKGs/Labs/Other Studies Reviewed:    The following studies were reviewed today: EKG September 19, 2022 normal sinus rhythm 66 no other abnormalities  Nuclear stress test 01/22/13: Overall low risk but no significant ischemia.   Nuclear stress EF: 61%. There was no ST segment deviation noted during stress. This is a low risk study. The left ventricular ejection fraction is normal (55-65%).   2019 NUC 1. EF 61%, normal wall motion.  2. Fixed small, mild apical anterior perfusion defect. Given normal wall motion, suspect attenuation.    Low risk study.   Recent Labs: 01/29/2022: Hemoglobin 13.1; Platelets 284.0 04/01/2022: ALT 21; BUN 11; Creatinine, Ser 0.62; Potassium 4.5; Sodium 139   LDL 60 2022.  Recent Lipid Panel    Component Value Date/Time   CHOL 154 08/27/2021 0852   CHOL 161 02/20/2021 0849   TRIG  50.0 08/27/2021 0852   HDL 83.60 08/27/2021 0852   HDL 86 02/20/2021 0849   CHOLHDL 2 08/27/2021 0852   VLDL 10.0 08/27/2021 0852   LDLCALC 60 08/27/2021 0852   LDLCALC 65 02/20/2021 0849     Risk Assessment/Calculations:              Physical Exam:    VS:  BP (!) 140/90 (BP Location: Left Arm, Patient Position: Sitting, Cuff Size: Normal)   Pulse 66   Ht 5\' 2"  (1.575 m)   Wt 191 lb (86.6 kg)   BMI 34.93 kg/m     Wt Readings from Last 3 Encounters:  09/19/2022 191 lb (86.6 kg)  06/17/22 190 lb (86.2 kg)  04/22/22 190 lb (86.2 kg)     GEN:  Well nourished, well developed in no acute distress HEENT: Normal NECK: No JVD; No carotid bruits LYMPHATICS: No lymphadenopathy CARDIAC: RRR, no murmurs, rubs, gallops RESPIRATORY:  Clear to auscultation without rales, wheezing or rhonchi  ABDOMEN: Soft, non-tender, non-distended MUSCULOSKELETAL:  No edema; No deformity  SKIN: Warm and dry NEUROLOGIC:  Alert and oriented x 3 PSYCHIATRIC:  Normal affect   ASSESSMENT:    1. Pre-op evaluation   2. Precordial pain   3. Primary hypertension   4. Ankylosing spondylitis of multiple sites in spine [facility])    PLAN:    In order of problems listed above:  Preop cardiac evaluation Upcoming knee surgery with [facility] in the middle of January 2024.  She has been experiencing some central chest discomfort could be GI related however given her multiple risk factors, we will go ahead and proceed with coronary CT scan for further evaluation.  Prior gastric bypass Takes Nexium.  Elevated blood pressure Had been on amlodipine 5 mg in the past.  Ankylosing spondylitis Per primary team/rheumatology  Right knee osteoarthritis Planning on knee replacement mid January 2024 Dr. February 2024.  Medication Adjustments/Labs and Tests Ordered: Current medicines are reviewed at length with the patient today.  Concerns regarding medicines are outlined above.  Orders Placed This Encounter   Procedures   CT CORONARY MORPH W/CTA COR W/SCORE W/CA W/CM &/OR WO/CM   Basic metabolic panel   EKG 12-Lead   Meds ordered this encounter  Medications   metoprolol tartrate (LOPRESSOR) 50 MG tablet    Sig: Take 1 tablet (50 mg total) by mouth as directed. Take one tablet 2 hours before your CT scan    Dispense:  1 tablet    Refill:  0    Patient Instructions  Medication Instructions:  The current medical regimen is effective;  continue present plan and medications.  *If you need a refill on your cardiac medications before your next appointment, please call your pharmacy*  Lab Work: Please have blood work today (BMP)  If you have labs (blood work) drawn today and your tests are completely normal, you will receive your results only by: MyChart Message (if you have MyChart) OR A paper copy in the mail If you have any lab test that is abnormal or we need to change your treatment, we will call you to review the results.   Testing/Procedures:  Your cardiac CT will be scheduled at:   [facility] [address] [place], [place] [address] [phone]  Please arrive at the [facility] and Children's Entrance (Entrance C2) of [facility] 30 minutes prior to test start time. You can use the FREE valet parking offered at entrance C (encouraged to control the heart rate for the test)  Proceed to the [facility] Radiology Department (first floor) to check-in and test prep.  All radiology patients and guests should use entrance C2 at [facility], accessed from [facility], even though the hospital's physical address listed is [address].     Please follow these instructions carefully (unless otherwise directed):  On the Night Before the Test: Be sure to Drink plenty of water. Do not consume any caffeinated/decaffeinated beverages or chocolate 12 hours prior to your test. Do not take any antihistamines 12 hours prior to your  test.  On the Day of the Test: Drink plenty of water until 1 hour prior to the test. Do not eat any food 1 hour prior to test. You may take your regular medications prior to the test.  Take metoprolol (Lopressor) two hours prior to test. HOLD Furosemide/Hydrochlorothiazide morning of the test. FEMALES- please wear underwire-free bra if available, avoid dresses & tight clothing      After the Test: Drink plenty of water. After receiving IV contrast, you may experience a mild flushed feeling. This is normal. On occasion, you may experience a mild rash up to 24 hours after the test. This is not dangerous. If this occurs, you can take Benadryl 25 mg and increase your fluid intake. If you experience trouble breathing, this can be serious. If it is severe call 911 IMMEDIATELY. If it is mild, please call our office.  We will call to schedule your test 2-4 weeks out understanding that some insurance companies will need an authorization prior to the service being performed.   For non-scheduling related questions, please contact the cardiac imaging nurse navigator should you have any questions/concerns: Rockwell Alexandria, Cardiac Imaging Nurse Navigator Larey Brick, Cardiac Imaging Nurse Navigator [facility] Direct Office Dial: [phone]   For scheduling needs, including cancellations and rescheduling, please call  [place], [phone].   Follow-Up: At [facility], you and your health needs are our priority.  As part of our continuing mission to provide you with exceptional heart care, we have created designated Provider Care Teams.  These Care Teams include your primary Cardiologist (physician) and Advanced Practice Providers (APPs -  Physician Assistants and Nurse Practitioners) who all work together to provide you with the care you need, when you need it.  We recommend signing up for the patient portal called "MyChart".  Sign up information is provided  on this After Visit Summary.  MyChart is used to connect with patients for Virtual Visits (Telemedicine).  Patients are able to view lab/test results, encounter notes, upcoming appointments, etc.  Non-urgent messages can be sent to your provider as well.   To learn more about what you can do with MyChart, go to [email or web address].    Your next appointment:   Follow up will be based on the results of the above testing.   Important Information About Sugar         Signed, Donato Schultz, MD  09/13/2022 9:12 AM    [facility]

## 2022-09-13 NOTE — Patient Instructions (Addendum)
Medication Instructions:  The current medical regimen is effective;  continue present plan and medications.  *If you need a refill on your cardiac medications before your next appointment, please call your pharmacy*  Lab Work: Please have blood work today (BMP)  If you have labs (blood work) drawn today and your tests are completely normal, you will receive your results only by: MyChart Message (if you have MyChart) OR A paper copy in the mail If you have any lab test that is abnormal or we need to change your treatment, we will call you to review the results.   Testing/Procedures:  Your cardiac CT will be scheduled at:   [facility] [address] [place], [place] [address] [phone]  Please arrive at the [facility] and Children's Entrance (Entrance C2) of [facility] 30 minutes prior to test start time. You can use the FREE valet parking offered at entrance C (encouraged to control the heart rate for the test)  Proceed to the [facility] Radiology Department (first floor) to check-in and test prep.  All radiology patients and guests should use entrance C2 at [facility], accessed from [facility], even though the hospital's physical address listed is [address].     Please follow these instructions carefully (unless otherwise directed):  On the Night Before the Test: Be sure to Drink plenty of water. Do not consume any caffeinated/decaffeinated beverages or chocolate 12 hours prior to your test. Do not take any antihistamines 12 hours prior to your test.  On the Day of the Test: Drink plenty of water until 1 hour prior to the test. Do not eat any food 1 hour prior to test. You may take your regular medications prior to the test.  Take metoprolol (Lopressor) two hours prior to test. HOLD Furosemide/Hydrochlorothiazide morning of the test. FEMALES- please wear underwire-free bra if available, avoid dresses & tight  clothing      After the Test: Drink plenty of water. After receiving IV contrast, you may experience a mild flushed feeling. This is normal. On occasion, you may experience a mild rash up to 24 hours after the test. This is not dangerous. If this occurs, you can take Benadryl 25 mg and increase your fluid intake. If you experience trouble breathing, this can be serious. If it is severe call 911 IMMEDIATELY. If it is mild, please call our office.  We will call to schedule your test 2-4 weeks out understanding that some insurance companies will need an authorization prior to the service being performed.   For non-scheduling related questions, please contact the cardiac imaging nurse navigator should you have any questions/concerns: Rockwell Alexandria, Cardiac Imaging Nurse Navigator Larey Brick, Cardiac Imaging Nurse Navigator [facility] Direct Office Dial: [phone]   For scheduling needs, including cancellations and rescheduling, please call [place], [phone].   Follow-Up: At [facility], you and your health needs are our priority.  As part of our continuing mission to provide you with exceptional heart care, we have created designated Provider Care Teams.  These Care Teams include your primary Cardiologist (physician) and Advanced Practice Providers (APPs -  Physician Assistants and Nurse Practitioners) who all work together to provide you with the care you need, when you need it.  We recommend signing up for the patient portal called "MyChart".  Sign up information is provided on this After Visit Summary.  MyChart is used to connect with patients for Virtual Visits (Telemedicine).  Patients are able to view lab/test results, encounter notes, upcoming appointments, etc.  Non-urgent messages can be sent to your provider as well.   To learn more about what you can do with MyChart, go to [email or web address].    Your next appointment:   Follow up  will be based on the results of the above testing.   Important Information About Sugar

## 2022-09-20 ENCOUNTER — Encounter (HOSPITAL_COMMUNITY): Payer: Self-pay

## 2022-09-20 ENCOUNTER — Telehealth (HOSPITAL_COMMUNITY): Payer: Self-pay | Admitting: *Deleted

## 2022-09-20 NOTE — Telephone Encounter (Signed)
Reaching out to patient to offer assistance regarding upcoming cardiac imaging study; pt verbalizes understanding of appt date/time, parking situation and where to check in, pre-test NPO status and medications ordered, and verified current allergies; name and call back number provided for further questions should they arise  Larey Brick RN Navigator Cardiac Imaging [facility] [phone] office [phone] cell  Patient to take 50mg  metoprolol two hours prior to her cardiac CT scan.  She is aware to arrive at 11:30am.

## 2022-09-24 ENCOUNTER — Ambulatory Visit (HOSPITAL_COMMUNITY): Admission: RE | Admit: 2022-09-24 | Payer: Medicare Other | Source: Ambulatory Visit

## 2022-09-25 ENCOUNTER — Other Ambulatory Visit: Payer: Self-pay

## 2022-09-25 NOTE — Progress Notes (Signed)
Surgery orders requested via Epic inbox. °

## 2022-10-01 ENCOUNTER — Encounter: Payer: Self-pay | Admitting: Family Medicine

## 2022-10-01 ENCOUNTER — Ambulatory Visit (INDEPENDENT_AMBULATORY_CARE_PROVIDER_SITE_OTHER): Payer: Medicare Other | Admitting: Family Medicine

## 2022-10-01 VITALS — BP 150/88 | HR 72 | Temp 98.7°F | Resp 18 | Ht 62.0 in | Wt 189.6 lb

## 2022-10-01 DIAGNOSIS — I10 Essential (primary) hypertension: Secondary | ICD-10-CM | POA: Diagnosis not present

## 2022-10-01 DIAGNOSIS — E559 Vitamin D deficiency, unspecified: Secondary | ICD-10-CM | POA: Diagnosis not present

## 2022-10-01 DIAGNOSIS — K219 Gastro-esophageal reflux disease without esophagitis: Secondary | ICD-10-CM | POA: Diagnosis not present

## 2022-10-01 DIAGNOSIS — Z6835 Body mass index (BMI) 35.0-35.9, adult: Secondary | ICD-10-CM | POA: Diagnosis not present

## 2022-10-01 DIAGNOSIS — R739 Hyperglycemia, unspecified: Secondary | ICD-10-CM | POA: Diagnosis not present

## 2022-10-01 DIAGNOSIS — Z6836 Body mass index (BMI) 36.0-36.9, adult: Secondary | ICD-10-CM

## 2022-10-01 DIAGNOSIS — Z0000 Encounter for general adult medical examination without abnormal findings: Secondary | ICD-10-CM | POA: Diagnosis not present

## 2022-10-01 LAB — COMPREHENSIVE METABOLIC PANEL
ALT: 13 U/L (ref 0–35)
AST: 21 U/L (ref 0–37)
Albumin: 4 g/dL (ref 3.5–5.2)
Alkaline Phosphatase: 69 U/L (ref 39–117)
BUN: 18 mg/dL (ref 6–23)
CO2: 27 mEq/L (ref 19–32)
Calcium: 9.6 mg/dL (ref 8.4–10.5)
Chloride: 103 mEq/L (ref 96–112)
Creatinine, Ser: 0.54 mg/dL (ref 0.40–1.20)
GFR: 97.55 mL/min (ref >60.00)
Glucose, Bld: 88 mg/dL (ref 70–99)
Potassium: 4.4 mEq/L (ref 3.5–5.1)
Sodium: 140 mEq/L (ref 135–145)
Total Bilirubin: 0.6 mg/dL (ref 0.2–1.2)
Total Protein: 6.7 g/dL (ref 6.0–8.3)

## 2022-10-01 LAB — LIPID PANEL
Cholesterol: 148 mg/dL (ref 0–200)
HDL: 77.4 mg/dL (ref >39.00)
LDL Cholesterol: 59 mg/dL (ref 0–99)
NonHDL: 70.53
Total CHOL/HDL Ratio: 2
Triglycerides: 56 mg/dL (ref 0.0–149.0)
VLDL: 11.2 mg/dL (ref 0.0–40.0)

## 2022-10-01 LAB — CBC WITH DIFFERENTIAL/PLATELET
Basophils Absolute: 0 10*3/uL (ref 0.0–0.1)
Basophils Relative: 0.8 % (ref 0.0–3.0)
Eosinophils Absolute: 0.1 10*3/uL (ref 0.0–0.7)
Eosinophils Relative: 1.3 % (ref 0.0–5.0)
HCT: 41 % (ref 36.0–46.0)
Hemoglobin: 13.8 g/dL (ref 12.0–15.0)
Lymphocytes Relative: 38.6 % (ref 12.0–46.0)
Lymphs Abs: 2.1 10*3/uL (ref 0.7–4.0)
MCHC: 33.6 g/dL (ref 30.0–36.0)
MCV: 96.9 fl (ref 78.0–100.0)
Monocytes Absolute: 0.5 10*3/uL (ref 0.1–1.0)
Monocytes Relative: 9 % (ref 3.0–12.0)
Neutro Abs: 2.7 10*3/uL (ref 1.4–7.7)
Neutrophils Relative %: 50.3 % (ref 43.0–77.0)
Platelets: 289 10*3/uL (ref 150.0–400.0)
RBC: 4.23 Mil/uL (ref 3.87–5.11)
RDW: 12.9 % (ref 11.5–15.5)
WBC: 5.4 10*3/uL (ref 4.0–10.5)

## 2022-10-01 LAB — VITAMIN D 25 HYDROXY (VIT D DEFICIENCY, FRACTURES): VITD: 39.25 ng/mL (ref 30.00–100.00)

## 2022-10-01 LAB — VITAMIN B12: Vitamin B-12: 710 pg/mL (ref 211–911)

## 2022-10-01 MED ORDER — CARVEDILOL 6.25 MG PO TABS: 6.2500 mg | ORAL_TABLET | Freq: Two times a day (BID) | ORAL | 3 refills | Status: DC

## 2022-10-01 NOTE — Progress Notes (Signed)
Subjective:   By signing my name below, I, Eugene Gavia, attest that this documentation has been prepared under the direction and in the presence of Ann Held, DO. 10/01/22    Patient ID: [name], female    DOB: 1956/01/31, 67 y.o.   MRN: [id number]  Chief Complaint  Patient presents with   Annual Exam    Pt states not fasting     HPI Patient is in today for comprehensive physical exam. She was last seen by me on 08/27/21 for an annual physical exam.  She reports that she has come off of the Amlodipine 56m daily. She is followed by Dr. SMarlou Porchfor cardiology care. He recently ordered a coronary CT as she reported feeling intermittent central chest discomfort at her appointment with him on 09/13/22.  She has occasional reflux flares which she feels may be causing this pain. She is using Carafate with some relief. Her pain is triggered with eating certain foods.  She is scheduled for right TKA with Dr. MParalee Cancellater this month. She is taking Ibuprofen for her right knee pain but notes that she was advised to stop using it due to her GI problems.  She states that she has not been exercising as she moved in with her parents to help take care of them. Her mother was recently diagnosed with bladder cancer. Her father has had multiple hospitalizations due to issues of UTIs and dehydration. She states that her father has some dementia.  She plans to get her first Shingrex dose.  She states that she has not yet scheduled an appointment with endocrinology as her last thyroid UKoreadid not indicate biopsy.  She denies having any fever, new muscle pain, new joint pain, new moles, congestion, sinus pain, sore throat, chest pain, palpations, cough, SOB, wheezing, n/v/d, constipation, blood in stool, dysuria, frequency, hematuria, or headaches at this time.   Past Medical History:  Diagnosis Date   Abdominal pain, other specified site 03/31/2008   Centricity Description:  FLANK PAIN, RIGHT Qualifier: Diagnosis of  By: HLinna DarnerMD, WGwyndolyn Saxon  Centricity Description: ABDOMINAL PAIN OTHER SPECIFIED SITE Qualifier: Diagnosis of  By: LJerold Coombe    Acute sprain or strain of cervical region 04/10/2012   ALLERGIC RHINITIS 06/26/2007   Qualifier: Diagnosis of  By: BMarca AnconaRMA, Lucy     ANKYLOSING SPONDYLITIS 03/31/2008   Qualifier: Diagnosis of  By: HLinna DarnerMD, William     Ankylosing spondylitis ([place]    neck and spin   Ankylosing spondylitis of multiple sites in spine ([facility]    Arthritis    Back pain    Bariatric surgery status 01/14/2008   Qualifier: Diagnosis of  By: LJerold Coombe    Blood in stool 12/02/2008   Qualifier: Diagnosis of  By: LJerold Coombe    Blood transfusion without reported diagnosis    Calf pain 10/13/2012   CHEST PAIN UNSPECIFIED 10/11/2009   Qualifier: Diagnosis of  By: KDeatra InaMD, RSandy Salaam   CONSTIPATION 04/06/2010   Qualifier: Diagnosis of  By: LJerold Coombe    CORNEAL ABRASION, LEFT 01/22/2010   Qualifier: Diagnosis of  By: LJerold Coombe    Gastritis    GERD 06/26/2007   Qualifier: Diagnosis of  By: BMarca AnconaRMA, Lucy     GERD (gastroesophageal reflux disease)    Hiatal hernia    Hypertension    no medications now   IBS 06/26/2007  Qualifier: Diagnosis of  By: Reatha Armour, Lucy     Joint pain    Lactose intolerance    LEG CRAMPS, NOCTURNAL 11/14/2010   Qualifier: Diagnosis of  By: Heath Lark     Long-term current use of steroids 04/20/2013   LYMPHADENOPATHY 06/10/2007   Qualifier: Diagnosis of  By: Lowne DO, [place] OBESITY 06/26/2007   Qualifier: Diagnosis of  By: Reatha Armour, Conner     MUSCLE PAIN 03/31/2008   Qualifier: Diagnosis of  By: Linna Darner MD, William     Noninfectious gastroenteritis and colitis 03/17/2013   Obesity (BMI 30-39.9) 03/16/2014   OSA (obstructive sleep apnea) 03/08/2016   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/23/2010   Qualifier: Diagnosis of  By: Jerold Coombe     Rheumatoid arthritis  ([place])    Seasonal allergies 05/14/2015   THYROID CYST 06/26/2007   Qualifier: Diagnosis of  By: Marca Ancona RMA, Lucy     THYROID NODULE, HX OF 06/10/2007   Qualifier: Diagnosis of  By: Jerold Coombe     Vitamin D deficiency     Past Surgical History:  Procedure Laterality Date   BARIATRIC SURGERY     BREAST REDUCTION SURGERY  1996   COLONOSCOPY  10/24/2009   kaplan-normal exam   ESOPHAGOGASTRODUODENOSCOPY     pelvis replacement  2010   TOTAL HIP ARTHROPLASTY     x 2 left, one revision   UPPER GASTROINTESTINAL ENDOSCOPY      Family History  Problem Relation Age of Onset   Hypertension Mother    High Cholesterol Mother    Diabetes Mother    Bladder Cancer Mother    Arthritis Father    Hypertension Father    Diabetes Father    High Cholesterol Father    Hypertension Sister    Diabetes Sister    Melanoma Maternal Grandmother    Breast cancer Maternal Grandmother        and Aunt   Colon cancer Neg Hx    Esophageal cancer Neg Hx    Rectal cancer Neg Hx    Stomach cancer Neg Hx    Colon polyps Neg Hx     Social History   Socioeconomic History   Marital status: Married    Spouse name: Ethyl Vila   Number of children: Not on file   Years of education: Not on file   Highest education level: Not on file  Occupational History   Occupation: retired  Tobacco Use   Smoking status: Never   Smokeless tobacco: Never  Vaping Use   Vaping Use: Never used  Substance and Sexual Activity   Alcohol use: No    Alcohol/week: 0.0 standard drinks of alcohol   Drug use: No   Sexual activity: Yes  Other Topics Concern   Not on file  Social History Narrative   Occupation: Production manager   Daily Caffeine use- 1 cup daily      Social Determinants of Health   Financial Resource Strain: Low Risk  (05/01/2021)   Overall Financial Resource Strain (CARDIA)    Difficulty of Paying Living Expenses: Not hard at all  Food Insecurity: No Food Insecurity (06/17/2022)   Hunger Vital Sign     Worried About Running Out of Food in the Last Year: Never true    Ran Out of Food in the Last Year: Never true  Transportation Needs: No Transportation Needs (06/17/2022)   PRAPARE - Transportation    Lack of Transportation (  Medical): No    Lack of Transportation (Non-Medical): No  Physical Activity: Inactive (06/17/2022)   Exercise Vital Sign    Days of Exercise per Week: 0 days    Minutes of Exercise per Session: 0 min  Stress: No Stress Concern Present (06/17/2022)   [place]    Feeling of Stress : Not at all  Social Connections: Moderately Integrated (06/17/2022)   Social Connection and Isolation Panel [NHANES]    Frequency of Communication with Friends and Family: More than three times a week    Frequency of Social Gatherings with Friends and Family: More than three times a week    Attends Religious Services: More than 4 times per year    Active Member of [employer] or Organizations: No    Attends Archivist Meetings: Never    Marital Status: Married  Human resources officer Violence: Not At Risk (06/17/2022)   Humiliation, Afraid, Rape, and Kick questionnaire    Fear of Current or Ex-Partner: No    Emotionally Abused: No    Physically Abused: No    Sexually Abused: No    Outpatient Medications Prior to Visit  Medication Sig Dispense Refill   Biotin w/ Vitamins C & E (HAIR SKIN & NAILS GUMMIES PO) Take 1 each by mouth daily.     Calcium Carb-Cholecalciferol (CALTRATE 600+D3 SOFT PO) Take by mouth.     esomeprazole (NEXIUM) 40 MG capsule Take 1 capsule (40 mg total) by mouth daily as needed. Crack open capsule and ingest contents. Please keep your January appointment for further refills. Thank you 30 capsule 1   fluticasone (FLONASE) 50 MCG/ACT nasal spray Use 2 spray(s) in each nostril once daily 16 g 0   metoprolol tartrate (LOPRESSOR) 50 MG tablet Take 1 tablet (50 mg total) by mouth as directed. Take one  tablet 2 hours before your CT scan 1 tablet 0   vitamin B-12 (CYANOCOBALAMIN) 1000 MCG tablet Take 1,000 mcg by mouth daily.     No facility-administered medications prior to visit.    Allergies  Allergen Reactions   Aspirin     REACTION: intolerance-gi upset   Codeine Itching   Morphine Itching   Sulfa Antibiotics Other (See Comments)    Unsure of reaction   Sulfonamide Derivatives     Unsure of reaction    Review of Systems  Constitutional:  Negative for chills, fever, malaise/fatigue and weight loss.  HENT:  Negative for hearing loss, sinus pain and sore throat.   Respiratory:  Negative for cough and hemoptysis.   Cardiovascular:  Negative for chest pain, palpitations, leg swelling and PND.  Gastrointestinal:  Positive for abdominal pain. Negative for constipation, diarrhea, heartburn, nausea and vomiting.  Genitourinary:  Negative for dysuria, frequency and urgency.  Musculoskeletal:  Positive for joint pain. Negative for back pain and neck pain.  Skin:  Negative for itching and rash.  Neurological:  Negative for dizziness, tingling, seizures and headaches.  Endo/Heme/Allergies:  Negative for polydipsia.  Psychiatric/Behavioral:  Negative for depression. The patient is not nervous/anxious.        Objective:    Physical Exam Vitals and nursing note reviewed.  Constitutional:      General: She is not in acute distress.    Appearance: She is well-developed. She is not diaphoretic.  HENT:     Head: Normocephalic and atraumatic.     Right Ear: External ear normal.     Left Ear: External ear normal.  Nose: Nose normal.  Eyes:     General:        Right eye: No discharge.        Left eye: No discharge.     Conjunctiva/sclera: Conjunctivae normal.     Pupils: Pupils are equal, round, and reactive to light.  Neck:     Thyroid: No thyromegaly.     Vascular: No JVD.  Cardiovascular:     Rate and Rhythm: Normal rate and regular rhythm.     Heart sounds: Normal heart  sounds. No murmur heard. Pulmonary:     Effort: Pulmonary effort is normal. No respiratory distress.     Breath sounds: Normal breath sounds. No wheezing or rales.  Chest:     Chest wall: No tenderness.  Abdominal:     General: Bowel sounds are normal. There is no distension.     Palpations: Abdomen is soft. There is no mass.     Tenderness: There is no abdominal tenderness. There is no guarding or rebound.  Genitourinary:    Vagina: Normal.  Musculoskeletal:        General: No tenderness. Normal range of motion.     Cervical back: Normal range of motion and neck supple.  Lymphadenopathy:     Cervical: No cervical adenopathy.  Skin:    General: Skin is warm and dry.     Findings: No erythema or rash.  Neurological:     Mental Status: She is alert and oriented to person, place, and time.     Cranial Nerves: No cranial nerve deficit.     Deep Tendon Reflexes: Reflexes are normal and symmetric.  Psychiatric:        Behavior: Behavior normal.        Thought Content: Thought content normal.        Judgment: Judgment normal.     BP (!) 150/88 (BP Location: Right Arm, Patient Position: Sitting, Cuff Size: Large)   Pulse 72   Temp 98.7 F (37.1 C) (Oral)   Resp 18   Ht _0  (1.575 m)   Wt 189 lb 9.6 oz (86 kg)   SpO2 99%   BMI 34.68 kg/m  Wt Readings from Last 3 Encounters:  10/01/22 189 lb 9.6 oz (86 kg)  09/13/22 191 lb (86.6 kg)  06/17/22 190 lb (86.2 kg)    Diabetic Foot Exam - Simple   No data filed    Lab Results  Component Value Date   WBC 4.8 01/29/2022   HGB 13.1 01/29/2022   HCT 39.5 01/29/2022   PLT 284.0 01/29/2022   GLUCOSE 95 09/13/2022   CHOL 154 08/27/2021   TRIG 50.0 08/27/2021   HDL 83.60 08/27/2021   LDLCALC 60 08/27/2021   ALT 21 04/01/2022   AST 23 04/01/2022   NA 142 09/13/2022   K 4.7 09/13/2022   CL 105 09/13/2022   CREATININE 0.67 09/13/2022   BUN 10 09/13/2022   CO2 25 09/13/2022   TSH 1.17 08/27/2021   INR 1.2 09/26/2008    HGBA1C 5.5 04/01/2022    Lab Results  Component Value Date   TSH 1.17 08/27/2021   Lab Results  Component Value Date   WBC 4.8 01/29/2022   HGB 13.1 01/29/2022   HCT 39.5 01/29/2022   MCV 97.0 01/29/2022   PLT 284.0 01/29/2022   Lab Results  Component Value Date   NA 142 09/13/2022   K 4.7 09/13/2022   CO2 25 09/13/2022   GLUCOSE 95 09/13/2022   BUN  10 09/13/2022   CREATININE 0.67 09/13/2022   BILITOT 0.4 04/01/2022   ALKPHOS 98 04/01/2022   AST 23 04/01/2022   ALT 21 04/01/2022   PROT 7.1 04/01/2022   ALBUMIN 4.2 04/01/2022   CALCIUM 9.6 09/13/2022   ANIONGAP 9 07/25/2021   EGFR 98 09/13/2022   GFR 94.44 01/29/2022   Lab Results  Component Value Date   CHOL 154 08/27/2021   Lab Results  Component Value Date   HDL 83.60 08/27/2021   Lab Results  Component Value Date   LDLCALC 60 08/27/2021   Lab Results  Component Value Date   TRIG 50.0 08/27/2021   Lab Results  Component Value Date   CHOLHDL 2 08/27/2021   Lab Results  Component Value Date   HGBA1C 5.5 04/01/2022       Assessment & Plan:   Problem List Items Addressed This Visit       Unprioritized   Primary hypertension    Poorly controlled will alter medications, encouraged DASH diet, minimize caffeine and obtain adequate sleep. Report concerning symptoms and follow up as directed and as needed       Relevant Medications   carvedilol (COREG) 6.25 MG tablet   Preventative health care - Primary    Ghm utd Check labs       GERD   Relevant Orders   Ambulatory referral to Gastroenterology   Lipid panel   Comprehensive metabolic panel   CBC with Differential/Platelet   Class 2 severe obesity with serious comorbidity and body mass index (BMI) of 36.0 to 36.9 in adult [facility])    Check labs  Pt is having difficulty exercising due to knee pain----  she had to put off surgery due to her parents needing her       Other Visit Diagnoses     Vitamin D insufficiency       Hyperglycemia        Relevant Orders   Lipid panel   Comprehensive metabolic panel   CBC with Differential/Platelet   Essential hypertension       Relevant Medications   carvedilol (COREG) 6.25 MG tablet   Other Relevant Orders   Lipid panel   Comprehensive metabolic panel   CBC with Differential/Platelet   Class 2 severe obesity with serious comorbidity and body mass index (BMI) of 35.0 to 35.9 in adult, unspecified obesity type (HCC)       Relevant Orders   VITAMIN D 25 Hydroxy (Vit-D Deficiency, Fractures)   Vitamin B12   Lipid panel   Comprehensive metabolic panel   CBC with Differential/Platelet        Meds ordered this encounter  Medications   carvedilol (COREG) 6.25 MG tablet    Sig: Take 1 tablet (6.25 mg total) by mouth 2 (two) times daily with a meal.    Dispense:  60 tablet    Refill:  3    I, Ann Held, DO, personally preformed the services described in this documentation.  All medical record entries made by the scribe were at my direction and in my presence.  I have reviewed the chart and discharge instructions (if applicable) and agree that the record reflects my personal performance and is accurate and complete. 10/01/22      Ann Held, DO

## 2022-10-01 NOTE — Assessment & Plan Note (Signed)
Poorly controlled will alter medications, encouraged DASH diet, minimize caffeine and obtain adequate sleep. Report concerning symptoms and follow up as directed and as needed 

## 2022-10-01 NOTE — Assessment & Plan Note (Signed)
Check labs  Pt is having difficulty exercising due to knee pain----  she had to put off surgery due to her parents needing her

## 2022-10-01 NOTE — Patient Instructions (Signed)
Preventive Care 40-64 Years Old, Female Preventive care refers to lifestyle choices and visits with your health care provider that can promote health and wellness. Preventive care visits are also called wellness exams. What can I expect for my preventive care visit? Counseling Your health care provider may ask you questions about your: Medical history, including: Past medical problems. Family medical history. Pregnancy history. Current health, including: Menstrual cycle. Method of birth control. Emotional well-being. Home life and relationship well-being. Sexual activity and sexual health. Lifestyle, including: Alcohol, nicotine or tobacco, and drug use. Access to firearms. Diet, exercise, and sleep habits. Work and work environment. Sunscreen use. Safety issues such as seatbelt and bike helmet use. Physical exam Your health care provider will check your: Height and weight. These may be used to calculate your BMI (body mass index). BMI is a measurement that tells if you are at a healthy weight. Waist circumference. This measures the distance around your waistline. This measurement also tells if you are at a healthy weight and may help predict your risk of certain diseases, such as type 2 diabetes and high blood pressure. Heart rate and blood pressure. Body temperature. Skin for abnormal spots. What immunizations do I need?  Vaccines are usually given at various ages, according to a schedule. Your health care provider will recommend vaccines for you based on your age, medical history, and lifestyle or other factors, such as travel or where you work. What tests do I need? Screening Your health care provider may recommend screening tests for certain conditions. This may include: Lipid and cholesterol levels. Diabetes screening. This is done by checking your blood sugar (glucose) after you have not eaten for a while (fasting). Pelvic exam and Pap test. Hepatitis B test. Hepatitis C  test. HIV (human immunodeficiency virus) test. STI (sexually transmitted infection) testing, if you are at risk. Lung cancer screening. Colorectal cancer screening. Mammogram. Talk with your health care provider about when you should start having regular mammograms. This may depend on whether you have a family history of breast cancer. BRCA-related cancer screening. This may be done if you have a family history of breast, ovarian, tubal, or peritoneal cancers. Bone density scan. This is done to screen for osteoporosis. Talk with your health care provider about your test results, treatment options, and if necessary, the need for more tests. Follow these instructions at home: Eating and drinking  Eat a diet that includes fresh fruits and vegetables, whole grains, lean protein, and low-fat dairy products. Take vitamin and mineral supplements as recommended by your health care provider. Do not drink alcohol if: Your health care provider tells you not to drink. You are pregnant, may be pregnant, or are planning to become pregnant. If you drink alcohol: Limit how much you have to 0-1 drink a day. Know how much alcohol is in your drink. In the [place] one drink equals one 12 oz bottle of beer (355 mL), one 5 oz glass of wine (148 mL), or one 1 oz glass of hard liquor (44 mL). Lifestyle Brush your teeth every morning and night with fluoride toothpaste. Floss one time each day. Exercise for at least 30 minutes 5 or more days each week. Do not use any products that contain nicotine or tobacco. These products include cigarettes, chewing tobacco, and vaping devices, such as e-cigarettes. If you need help quitting, ask your health care provider. Do not use drugs. If you are sexually active, practice safe sex. Use a condom or other form of protection to   prevent STIs. If you do not wish to become pregnant, use a form of birth control. If you plan to become pregnant, see your health care provider for a  prepregnancy visit. Take aspirin only as told by your health care provider. Make sure that you understand how much to take and what form to take. Work with your health care provider to find out whether it is safe and beneficial for you to take aspirin daily. Find healthy ways to manage stress, such as: Meditation, yoga, or listening to music. Journaling. Talking to a trusted person. Spending time with friends and family. Minimize exposure to UV radiation to reduce your risk of skin cancer. Safety Always wear your seat belt while driving or riding in a vehicle. Do not drive: If you have been drinking alcohol. Do not ride with someone who has been drinking. When you are tired or distracted. While texting. If you have been using any mind-altering substances or drugs. Wear a helmet and other protective equipment during sports activities. If you have firearms in your house, make sure you follow all gun safety procedures. Seek help if you have been physically or sexually abused. What's next? Visit your health care provider once a year for an annual wellness visit. Ask your health care provider how often you should have your eyes and teeth checked. Stay up to date on all vaccines. This information is not intended to replace advice given to you by your health care provider. Make sure you discuss any questions you have with your health care provider. Document Revised: 03/14/2021 Document Reviewed: 03/14/2021 Elsevier Patient Education  [place].

## 2022-10-01 NOTE — Assessment & Plan Note (Signed)
Ghm utd  Check labs  

## 2022-10-02 ENCOUNTER — Telehealth (HOSPITAL_COMMUNITY): Payer: Self-pay | Admitting: Emergency Medicine

## 2022-10-02 ENCOUNTER — Encounter (HOSPITAL_COMMUNITY)
Admission: RE | Admit: 2022-10-02 | Discharge: 2022-10-02 | Disposition: A | Discharge status: Home / Self Care | Payer: Medicare Other | Source: Ambulatory Visit | Attending: Family Medicine | Admitting: Family Medicine

## 2022-10-02 NOTE — Telephone Encounter (Signed)
Reaching out to patient to offer assistance regarding upcoming cardiac imaging study; pt verbalizes understanding of appt date/time, parking situation and where to check in, pre-test NPO status and medications ordered, and verified current allergies; name and call back number provided for further questions should they arise Marchia Bond RN Navigator Cardiac Imaging [facility] [phone] office [phone] cell  Arrival [address] Denies iv issues 50mg  metoprolol tartrate

## 2022-10-03 DIAGNOSIS — M1711 Unilateral primary osteoarthritis, right knee: Secondary | ICD-10-CM | POA: Diagnosis not present

## 2022-10-04 ENCOUNTER — Ambulatory Visit (HOSPITAL_COMMUNITY)
Admission: RE | Admit: 2022-10-04 | Discharge: 2022-10-04 | Disposition: A | Discharge status: Home / Self Care | Payer: Medicare Other | Source: Ambulatory Visit | Attending: Cardiology | Admitting: Cardiology

## 2022-10-04 DIAGNOSIS — Z01818 Encounter for other preprocedural examination: Secondary | ICD-10-CM | POA: Insufficient documentation

## 2022-10-04 DIAGNOSIS — R072 Precordial pain: Secondary | ICD-10-CM | POA: Insufficient documentation

## 2022-10-04 MED ORDER — NITROGLYCERIN 0.4 MG SL SUBL: 0.8000 mg | SUBLINGUAL_TABLET | Freq: Once | SUBLINGUAL | Status: AC

## 2022-10-04 MED ORDER — NITROGLYCERIN 0.4 MG SL SUBL
SUBLINGUAL_TABLET | SUBLINGUAL | Status: AC
  Administered 2022-10-04: 0.8 mg via SUBLINGUAL
  Filled 2022-10-04: qty 2

## 2022-10-04 MED ORDER — IOHEXOL 350 MG/ML SOLN
100.0000 mL | Freq: Once | INTRAVENOUS | Status: AC | PRN
  Administered 2022-10-04: 100 mL via INTRAVENOUS

## 2022-10-08 ENCOUNTER — Telehealth: Payer: Self-pay | Admitting: Cardiology

## 2022-10-08 NOTE — Telephone Encounter (Signed)
Courtney Pain, MD 10/04/2022  3:58 PM EST     Non obstructive coronary artery disease.  Calcium score is 94. Consider Crestor 10 mg once a day.  Repeat lipid panel in 2 months.   She may proceed with right knee replacement with Dr. Alvan Dame.   Candee Furbish, MD    Left message for patient to call back.

## 2022-10-08 NOTE — Telephone Encounter (Signed)
Patient was returning call, to discuss results. Please advise

## 2022-10-08 NOTE — Telephone Encounter (Signed)
Patient called back for her results. Informed patient of her CT results. Patient is concerned about taking cholesterol medication since her Cholesterol is good. Her total cholesterol is 148,  HDL 77, and LDL 59 (10/01/22).    Patient was also concerned about her BP.  SBP 150"s HR 70's. Patient just started Carvedilol one day ago. Informed patient that she might need to give the medication some time to work. Patient stated she has tried lisinopril in the past and it is not for her. Patient wants to know since having the CT test, what would be the best BP medication for her. Informed patient that a message would be sent to Dr. Marlou Porch for advisement.

## 2022-10-09 NOTE — Telephone Encounter (Signed)
Patient called back. Dr. Marlou Porch recommend patient to come in and see APP. Scheduled patient to see Ambrose Pancoast NP. Patient agreed to the appointment.

## 2022-10-09 NOTE — Telephone Encounter (Signed)
Left message for patient to call back  

## 2022-10-11 ENCOUNTER — Ambulatory Visit: Payer: Medicare Other | Admitting: Nurse Practitioner

## 2022-10-14 ENCOUNTER — Ambulatory Visit: Payer: Medicare Other | Admitting: Physician Assistant

## 2022-10-15 ENCOUNTER — Ambulatory Visit: Admit: 2022-10-15 | Payer: Medicare Other | Admitting: Orthopedic Surgery

## 2022-10-15 DIAGNOSIS — G4733 Obstructive sleep apnea (adult) (pediatric): Secondary | ICD-10-CM | POA: Diagnosis not present

## 2022-10-15 SURGERY — ARTHROPLASTY, KNEE, TOTAL
Anesthesia: Spinal | Site: Knee | Laterality: Right

## 2022-10-21 ENCOUNTER — Telehealth: Payer: Self-pay | Admitting: *Deleted

## 2022-10-21 NOTE — Telephone Encounter (Signed)
  Pre-operative Risk Assessment    Patient Name: [name]  DOB: September 05, 1956 MRN: [id number]        Request for Surgical Clearance     Procedure:   right total knee  arthroplasty   Date of Surgery:  Clearance 10/15/21                                 Surgeon:  dr Paralee Cancel Surgeon's Group or Practice Name:  [facility] Phone number:  [phone] Fax number:  [phone]   Type of Clearance Requested:   - Medical    Type of Anesthesia:  Spinal   Additional requests/questions:     Gwenlyn Found   08/21/2022, 9:42 AM    10/04/2022  3:58 PM EST      Non obstructive coronary artery disease.  Calcium score is 94. Consider Crestor 10 mg once a day.  Repeat lipid panel in 2 months.   She may proceed with right knee replacement with Dr. Alvan Dame.   Candee Furbish, MD

## 2022-10-21 NOTE — Telephone Encounter (Signed)
   Patient Name: [name]  DOB: [birth year] MRN: [id number]  Primary Cardiologist: None  Chart reviewed as part of pre-operative protocol coverage. Given past medical history and time since last visit, based on ACC/AHA guidelines, NIYANA CHESBRO is at acceptable risk for the planned procedure without further cardiovascular testing.   Per Dr. Kandis Mannan comment on coronary CTA result, "Non obstructive coronary artery disease.  Calcium score is 94. Consider Crestor 10 mg once a day.  Repeat lipid panel in 2 months.  She may proceed with right knee replacement with Dr. Alvan Dame.   Candee Furbish, MD"  I will route this recommendation to the requesting party via Epic fax function and remove from pre-op pool.  Please call with questions.  Lenna Sciara, NP 10/21/2022, 11:05 AM

## 2022-10-21 NOTE — Telephone Encounter (Signed)
Per documentation on CT result:  10/04/2022  3:58 PM EST     Non obstructive coronary artery disease.  Calcium score is 94. Consider Crestor 10 mg once a day.  Repeat lipid panel in 2 months.   She may proceed with right knee replacement with Dr. Alvan Dame.   Candee Furbish, MD

## 2022-10-29 ENCOUNTER — Telehealth: Payer: Self-pay | Admitting: Family Medicine

## 2022-10-29 ENCOUNTER — Other Ambulatory Visit: Payer: Self-pay | Admitting: Family Medicine

## 2022-10-29 DIAGNOSIS — I10 Essential (primary) hypertension: Secondary | ICD-10-CM

## 2022-10-29 MED ORDER — AMLODIPINE BESYLATE 5 MG PO TABS: 5.0000 mg | ORAL_TABLET | Freq: Every day | ORAL | 3 refills | Status: DC

## 2022-10-29 NOTE — Telephone Encounter (Signed)
Please advise 

## 2022-10-29 NOTE — Telephone Encounter (Signed)
Patient called to request a refill on her amlodipine because the carvedilol (COREG) 6.25 MG tablet  causes a heaviness in her chest that she can feel in her sleep so she doesn't like it and she stopped taking it. She said her blood pressure is better with the natural stuff she is trying but she wants to add the amlodipine back in. Please call her to advise.

## 2022-10-30 NOTE — Telephone Encounter (Signed)
Noted. VM left for patient 

## 2022-11-04 ENCOUNTER — Encounter: Payer: Self-pay | Admitting: Cardiology

## 2022-11-04 DIAGNOSIS — I10 Essential (primary) hypertension: Secondary | ICD-10-CM

## 2022-11-04 NOTE — Telephone Encounter (Signed)
Spoke with the patient who states that she has been trying to work with her PCP to manage her blood pressure. She was not able to tolerate carvedilol or amlodipine. She states that they both gave her chest discomfort. She is not currently having any chest pain. She states that it was over the weekend. She reports that she previously had similar symptoms when prescribed amlodipine in the past. She is going to stop taking the amlodipine and let us know if symptoms return off of the medicine. She reports that she has been keeping a log of her blood pressures and they are averaging 140s/80s and getting as high as 151/90. She reports that she takes her blood pressure first thing in the morning prior to her medications. Advised patient to start taking her BP a couple hours after she takes her medications and start a new log.

## 2022-11-06 ENCOUNTER — Telehealth: Payer: Self-pay | Admitting: Family Medicine

## 2022-11-06 NOTE — Telephone Encounter (Signed)
Pt just wanted to advise the amLODipine (NORVASC) 5 MG tablet has been causing chest pain so she stopped taking it. Her bp is still elevated but she does not want to continue with that rx. Transferred to triage for nurse eval.

## 2022-11-06 NOTE — Telephone Encounter (Signed)
Caller Name [place] Phone Number [phone] Patient Name [name] Patient DOB 26-Sep-1956 Call Type Message Only Information Provided Reason for Call Medication Question / Request Initial Comment Caller states she was given medication for high BP. Medication not working for her despite changing it already, states she needs medication. No current symptoms just needs medication changed. Additional Comment Declined triage, would like a call back from the office. Disp. Time Disposition Final User 11/06/2022 8:27:44 AM General Information Provided Yes Dara Hoyer Call Closed By: Dara Hoyer Transaction Date/Time: 11/06/2022 8:24:25 AM (ET)

## 2022-11-06 NOTE — Telephone Encounter (Signed)
Have her sched an appt to discuss her BP. Bring home readings to appt. Ty.

## 2022-11-06 NOTE — Telephone Encounter (Signed)
Pt was seen on 10/01/2022 for CPE. Pt called on 10/29/22 to have medication changed from Coreg 6.25 MG back to Amlodipine 5 MG due to the Coreg causing chest heaviness. Pt called today stating that she has stopped Amlodipine due to medication not controlling her blood pressure. Please advise.

## 2022-11-07 ENCOUNTER — Ambulatory Visit
Admission: EM | Admit: 2022-11-07 | Discharge: 2022-11-07 | Disposition: A | Discharge status: Home / Self Care | Payer: Medicare Other | Attending: Physician Assistant | Admitting: Physician Assistant

## 2022-11-07 ENCOUNTER — Encounter: Payer: Self-pay | Admitting: Emergency Medicine

## 2022-11-07 DIAGNOSIS — I10 Essential (primary) hypertension: Secondary | ICD-10-CM

## 2022-11-07 MED ORDER — LISINOPRIL 5 MG PO TABS: 5.0000 mg | ORAL_TABLET | Freq: Every day | ORAL | 0 refills | Status: DC

## 2022-11-07 NOTE — Telephone Encounter (Signed)
Pt currently at UC.

## 2022-11-07 NOTE — ED Provider Notes (Signed)
[facility]    CSN: [id number] Arrival date & time: 11/07/22  1314      History   Chief Complaint Chief Complaint  Patient presents with   Hypertension    HPI [name] is a 67 y.o. female.   Patient here today for evaluation of elevated blood pressure over the last few days. She reports she has not been taking BP meds prescribed given side effect of chest discomfort. She notes same side effect with initial BP treatment as well. She reports chest discomfort has improved significantly after drinking vinegar juice for suspected reflux. She has not had any shortness of breath. She denies headache or other symptoms.   The history is provided by the patient.  Hypertension Associated symptoms include chest pain (resolved). Pertinent negatives include no headaches and no shortness of breath.    Past Medical History:  Diagnosis Date   Abdominal pain, other specified site 03/31/2008   Centricity Description: FLANK PAIN, RIGHT Qualifier: Diagnosis of  By: Linna Darner MD, Gwyndolyn Saxon   Centricity Description: ABDOMINAL PAIN OTHER SPECIFIED SITE Qualifier: Diagnosis of  By: Jerold Coombe     Acute sprain or strain of cervical region 04/10/2012   ALLERGIC RHINITIS 06/26/2007   Qualifier: Diagnosis of  By: Marca Ancona RMA, Lucy     ANKYLOSING SPONDYLITIS 03/31/2008   Qualifier: Diagnosis of  By: Linna Darner MD, William     Ankylosing spondylitis ([place])    neck and spin   Ankylosing spondylitis of multiple sites in spine [facility])    Arthritis    Back pain    Bariatric surgery status 01/14/2008   Qualifier: Diagnosis of  By: Etter Sjogren DOKendrick Fries     Blood in stool 12/02/2008   Qualifier: Diagnosis of  By: Jerold Coombe     Blood transfusion without reported diagnosis    Calf pain 10/13/2012   CHEST PAIN UNSPECIFIED 10/11/2009   Qualifier: Diagnosis of  By: Deatra Ina MD, Sandy Salaam    CONSTIPATION 04/06/2010   Qualifier: Diagnosis of  By: Jerold Coombe     CORNEAL ABRASION, LEFT 01/22/2010   Qualifier:  Diagnosis of  By: Jerold Coombe     Gastritis    GERD 06/26/2007   Qualifier: Diagnosis of  By: Marca Ancona RMA, Lucy     GERD (gastroesophageal reflux disease)    Hiatal hernia    Hypertension    no medications now   IBS 06/26/2007   Qualifier: Diagnosis of  By: Marca Ancona RMA, Lucy     Joint pain    Lactose intolerance    LEG CRAMPS, NOCTURNAL 11/14/2010   Qualifier: Diagnosis of  By: Heath Lark     Long-term current use of steroids 04/20/2013   LYMPHADENOPATHY 06/10/2007   Qualifier: Diagnosis of  By: Jerold Coombe     MORBID OBESITY 06/26/2007   Qualifier: Diagnosis of  By: Larose Kells     MUSCLE PAIN 03/31/2008   Qualifier: Diagnosis of  By: Linna Darner MD, William     Noninfectious gastroenteritis and colitis 03/17/2013   Obesity (BMI 30-39.9) 03/16/2014   OSA (obstructive sleep apnea) 03/08/2016   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/23/2010   Qualifier: Diagnosis of  By: Jerold Coombe     Rheumatoid arthritis ([place])    Seasonal allergies 05/14/2015   THYROID CYST 06/26/2007   Qualifier: Diagnosis of  By: Marca Ancona RMA, Lucy     THYROID NODULE, HX OF 06/10/2007   Qualifier: Diagnosis of  By: Etter Sjogren DO,  Kendrick Fries     Vitamin D deficiency     Patient Active Problem List   Diagnosis Date Noted   Preventative health care 10/01/2022   Elevated serum creatinine 06/26/2021   COVID-19 04/26/2021   Diverticulitis 02/20/2021   Depression 07/10/2020   History of gastric bypass 07/10/2020   Has daytime drowsiness 05/09/2020   Other fatigue 02/29/2020   Short of breath on exertion 02/29/2020   Vitamin D deficiency 08/11/2018   Gastric bypass status for obesity 07/30/2018   Hives 06/07/2018   Primary osteoarthritis of right knee 01/23/2018   Primary osteoarthritis of both hands 01/23/2018   History of iritis 11/28/2017   History of total hip replacement, left 11/28/2017   Primary osteoarthritis of both knees 11/28/2017   Obstructive sleep apnea 03/08/2016   Seasonal allergies  05/14/2015   Obesity (BMI 30-39.9) 03/16/2014   Long-term current use of steroids 04/20/2013   Noninfectious gastroenteritis and colitis 03/17/2013   Calf pain 10/13/2012   Acute sprain or strain of cervical region 04/10/2012   LEG CRAMPS, NOCTURNAL 11/14/2010   OTHER SPECIFIED DISEASE OF WHITE BLOOD CELLS 07/23/2010   CONSTIPATION 04/06/2010   CORNEAL ABRASION, LEFT 01/22/2010   CHEST PAIN UNSPECIFIED 10/11/2009   BLOOD IN STOOL 12/02/2008   ANKYLOSING SPONDYLITIS 03/31/2008   MUSCLE PAIN 03/31/2008   BARIATRIC SURGERY STATUS 01/14/2008   THYROID CYST 06/26/2007   Class 2 severe obesity with serious comorbidity and body mass index (BMI) of 36.0 to 36.9 in adult [facility]) 06/26/2007   Primary hypertension 06/26/2007   GERD 06/26/2007   IBS 06/26/2007   LYMPHADENOPATHY 06/10/2007   History of thyroid nodule 06/10/2007    Past Surgical History:  Procedure Laterality Date   BARIATRIC SURGERY     BREAST REDUCTION SURGERY  1996   COLONOSCOPY  10/24/2009   kaplan-normal exam   ESOPHAGOGASTRODUODENOSCOPY     pelvis replacement  2010   TOTAL HIP ARTHROPLASTY     x 2 left, one revision   UPPER GASTROINTESTINAL ENDOSCOPY      OB History     Gravida  0   Para  0   Term  0   Preterm  0   AB  0   Living  0      SAB  0   IAB  0   Ectopic  0   Multiple  0   Live Births  0            Home Medications    Prior to Admission medications   Medication Sig Start Date End Date Taking? Authorizing Provider  Biotin w/ Vitamins C & E (HAIR SKIN & NAILS GUMMIES PO) Take 1 each by mouth daily.    [provider]  Calcium Carb-Cholecalciferol (CALTRATE 600+D3 SOFT PO) Take by mouth.    [provider]  esomeprazole (NEXIUM) 40 MG capsule Take 1 capsule (40 mg total) by mouth daily as needed. Crack open capsule and ingest contents. Please keep your January appointment for further refills. Thank you 09/05/21   Yetta Flock, MD  fluticasone Asencion Islam)  50 MCG/ACT nasal spray Use 2 spray(s) in each nostril once daily 06/01/21   Carollee Herter, Alferd Apa, DO  lisinopril (ZESTRIL) 5 MG tablet Take 1 tablet (5 mg total) by mouth daily. 11/07/22   Francene Finders, PA-C  vitamin B-12 (CYANOCOBALAMIN) 1000 MCG tablet Take 1,000 mcg by mouth daily.    [provider]    Family History Family History  Problem Relation Age of  Onset   Hypertension Mother    High Cholesterol Mother    Diabetes Mother    Bladder Cancer Mother    Arthritis Father    Hypertension Father    Diabetes Father    High Cholesterol Father    Hypertension Sister    Diabetes Sister    Melanoma Maternal Grandmother    Breast cancer Maternal Grandmother        and Aunt   Colon cancer Neg Hx    Esophageal cancer Neg Hx    Rectal cancer Neg Hx    Stomach cancer Neg Hx    Colon polyps Neg Hx     Social History Social History   Tobacco Use   Smoking status: Never   Smokeless tobacco: Never  Vaping Use   Vaping Use: Never used  Substance Use Topics   Alcohol use: No    Alcohol/week: 0.0 standard drinks of alcohol   Drug use: No     Allergies   Aspirin, Codeine, Morphine, Sulfa antibiotics, and Sulfonamide derivatives   Review of Systems Review of Systems  Constitutional:  Negative for chills and fever.  Eyes:  Negative for discharge and redness.  Respiratory:  Negative for shortness of breath.   Cardiovascular:  Positive for chest pain (resolved).  Gastrointestinal:  Negative for nausea and vomiting.  Neurological:  Negative for headaches.     Physical Exam Triage Vital Signs ED Triage Vitals  Enc Vitals Group     BP 11/07/22 1342 (!) 156/91     Pulse Rate 11/07/22 1342 79     Resp 11/07/22 1342 18     Temp 11/07/22 1343 98.6 F (37 C)     Temp Source 11/07/22 1343 Oral     SpO2 11/07/22 1342 98 %     Weight --      Height --      Head Circumference --      Peak Flow --      Pain Score 11/07/22 1341 0     Pain Loc --      Pain Edu? --       Excl. in GC? --    No data found.  Updated Vital Signs BP (!) 163/91 (BP Location: Right Arm)   Pulse 79   Temp 98.6 F (37 C) (Oral)   Resp 18   SpO2 98%   Physical Exam Vitals and nursing note reviewed.  Constitutional:      General: She is not in acute distress.    Appearance: Normal appearance. She is not ill-appearing.  HENT:     Head: Normocephalic and atraumatic.  Eyes:     Conjunctiva/sclera: Conjunctivae normal.  Cardiovascular:     Rate and Rhythm: Normal rate and regular rhythm.     Heart sounds: Normal heart sounds.  Pulmonary:     Effort: Pulmonary effort is normal. No respiratory distress.     Breath sounds: Normal breath sounds. No wheezing, rhonchi or rales.  Neurological:     Mental Status: She is alert.  Psychiatric:        Mood and Affect: Mood normal.        Behavior: Behavior normal.        Thought Content: Thought content normal.      UC Treatments / Results  Labs (all labs ordered are listed, but only abnormal results are displayed) Labs Reviewed - No data to display  EKG- NSR   Radiology No results found.  Procedures Procedures (including critical care time)  Medications Ordered in UC Medications - No data to display  Initial Impression / Assessment and Plan / UC Course  I have reviewed the triage vital signs and the nursing notes.  Pertinent labs & imaging results that were available during my care of the patient were reviewed by me and considered in my medical decision making (see chart for details).    Will trial lisinopril for treatment of HTN. Recommended she continue to monitor and log BP for PCP review. Advised ED with any worsening chest discomfort, etc. Encouraged sooner follow up with any further concerns.   Final Clinical Impressions(s) / UC Diagnoses   Final diagnoses:  Essential hypertension   Discharge Instructions   None    ED Prescriptions     Medication Sig Dispense Auth. Provider   lisinopril  (ZESTRIL) 5 MG tablet  (Status: Discontinued) Take 1 tablet (5 mg total) by mouth daily. 30 tablet Ewell Poe F, PA-C   lisinopril (ZESTRIL) 5 MG tablet Take 1 tablet (5 mg total) by mouth daily. 30 tablet Francene Finders, PA-C      PDMP not reviewed this encounter.   Francene Finders, PA-C 11/07/22 [id number]

## 2022-11-07 NOTE — Telephone Encounter (Signed)
Patient called back stating her BP is 150/104 and is unsure if she should go to an UC or if she is ok to wait for her ov on Monday. Patient was triaged.

## 2022-11-07 NOTE — Telephone Encounter (Signed)
Spoke with patient. Pt schedule for Monday with [place]. Pt advised of sxs that she may experience she should go to the ER

## 2022-11-07 NOTE — ED Triage Notes (Signed)
Pt has HTN and had medication changed twice. Hasn't had in two days due to pain in chest. Today checked 150/104.  Pt reports hx acid reflux and drank vinegar and today felt upper chest discomfort-denies pain or heaviness.  PCP out of town.

## 2022-11-11 ENCOUNTER — Ambulatory Visit: Payer: Medicare Other | Admitting: Family Medicine

## 2022-11-18 ENCOUNTER — Ambulatory Visit (INDEPENDENT_AMBULATORY_CARE_PROVIDER_SITE_OTHER): Payer: Medicare Other | Admitting: Family Medicine

## 2022-11-18 ENCOUNTER — Encounter: Payer: Self-pay | Admitting: Family Medicine

## 2022-11-18 VITALS — BP 140/90 | HR 66 | Temp 98.4°F | Resp 18 | Ht 62.0 in | Wt 192.0 lb

## 2022-11-18 DIAGNOSIS — I10 Essential (primary) hypertension: Secondary | ICD-10-CM | POA: Diagnosis not present

## 2022-11-18 DIAGNOSIS — E785 Hyperlipidemia, unspecified: Secondary | ICD-10-CM | POA: Diagnosis not present

## 2022-11-18 MED ORDER — LOSARTAN POTASSIUM 25 MG PO TABS: 25.0000 mg | ORAL_TABLET | Freq: Every day | ORAL | 1 refills | Status: DC

## 2022-11-18 MED ORDER — ROSUVASTATIN CALCIUM 10 MG PO TABS: 10.0000 mg | ORAL_TABLET | Freq: Every day | ORAL | 3 refills | Status: DC

## 2022-11-18 NOTE — Patient Instructions (Signed)

## 2022-11-18 NOTE — Assessment & Plan Note (Signed)
Poorly controlled will alter medications, encouraged DASH diet, minimize caffeine and obtain adequate sleep. Report concerning symptoms and follow up as directed and as needed  Start losartan and f/u 2 weeks or sooner as needed

## 2022-11-18 NOTE — Progress Notes (Signed)
Subjective:   By signing my name below, I, [name], attest that this documentation has been prepared under the direction and in the presence of Ann Held, DO 11/18/22   Patient ID: [name], female    DOB: April 12, 1956, 67 y.o.   MRN: [id number]  Chief Complaint  Patient presents with   Hypertension   Follow-up    HPI Patient is in today for a follow up.   She complains of her blood pressure running high. She has been monitoring her blood pressure at home and it was running high in the [id number] systolic and 0000000 diastolic. She attributed the high readings to her machine malfunctioning so she bought a new blood pressure machine. She was getting higher readings on the new machine. She presented to the urgent care and was put on a low does of Lisinopril, 5 mg daily. She reports that while on Amlodipine she experienced chest fullness and tightness, mainly at nighttime. She endorses feeling like her "chest was caving."   She has also been experiencing pain and discomfort in her right knee and while on vacation the pain worsened. She had a right total knee arthroplasty scheduled for January and had to reschedule to March. She has been having to assist in taking care of her parents and had to cancel the surgery. She hopes to be able to reschedule the surgery for later this year in October.   She reports that due to a high calcium score she was encouraged to start Crestor after an office visit with Dr. Marlou Porch.   Past Medical History:  Diagnosis Date   Abdominal pain, other specified site 03/31/2008   Centricity Description: FLANK PAIN, RIGHT Qualifier: Diagnosis of  By: Linna Darner MD, Gwyndolyn Saxon   Centricity Description: ABDOMINAL PAIN OTHER SPECIFIED SITE Qualifier: Diagnosis of  By: Jerold Coombe     Acute sprain or strain of cervical region 04/10/2012   ALLERGIC RHINITIS 06/26/2007   Qualifier: Diagnosis of  By: Marca Ancona RMA, Lucy     ANKYLOSING SPONDYLITIS 03/31/2008   Qualifier:  Diagnosis of  By: Linna Darner MD, William     Ankylosing spondylitis ([place])    neck and spin   Ankylosing spondylitis of multiple sites in spine [facility])    Arthritis    Back pain    Bariatric surgery status 01/14/2008   Qualifier: Diagnosis of  By: Etter Sjogren DOKendrick Fries     Blood in stool 12/02/2008   Qualifier: Diagnosis of  By: Jerold Coombe     Blood transfusion without reported diagnosis    Calf pain 10/13/2012   CHEST PAIN UNSPECIFIED 10/11/2009   Qualifier: Diagnosis of  By: Deatra Ina MD, Sandy Salaam    CONSTIPATION 04/06/2010   Qualifier: Diagnosis of  By: Jerold Coombe     CORNEAL ABRASION, LEFT 01/22/2010   Qualifier: Diagnosis of  By: Jerold Coombe     Gastritis    GERD 06/26/2007   Qualifier: Diagnosis of  By: Marca Ancona RMA, Lucy     GERD (gastroesophageal reflux disease)    Hiatal hernia    Hypertension    no medications now   IBS 06/26/2007   Qualifier: Diagnosis of  By: Marca Ancona RMA, Lucy     Joint pain    Lactose intolerance    LEG CRAMPS, NOCTURNAL 11/14/2010   Qualifier: Diagnosis of  By: Heath Lark     Long-term current use of steroids 04/20/2013   LYMPHADENOPATHY 06/10/2007   Qualifier: Diagnosis of  By: Etter Sjogren  DO, Shada Nienaber     MORBID OBESITY 06/26/2007   Qualifier: Diagnosis of  By: Larose Kells     MUSCLE PAIN 03/31/2008   Qualifier: Diagnosis of  By: Linna Darner MD, William     Noninfectious gastroenteritis and colitis 03/17/2013   Obesity (BMI 30-39.9) 03/16/2014   OSA (obstructive sleep apnea) 03/08/2016   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/23/2010   Qualifier: Diagnosis of  By: Jerold Coombe     Rheumatoid arthritis ([place])    Seasonal allergies 05/14/2015   THYROID CYST 06/26/2007   Qualifier: Diagnosis of  By: Marca Ancona RMA, Lucy     THYROID NODULE, HX OF 06/10/2007   Qualifier: Diagnosis of  By: Jerold Coombe     Vitamin D deficiency     Past Surgical History:  Procedure Laterality Date   BARIATRIC SURGERY     BREAST REDUCTION SURGERY  1996   COLONOSCOPY   10/24/2009   kaplan-normal exam   ESOPHAGOGASTRODUODENOSCOPY     pelvis replacement  2010   TOTAL HIP ARTHROPLASTY     x 2 left, one revision   UPPER GASTROINTESTINAL ENDOSCOPY      Family History  Problem Relation Age of Onset   Hypertension Mother    High Cholesterol Mother    Diabetes Mother    Bladder Cancer Mother    Arthritis Father    Hypertension Father    Diabetes Father    High Cholesterol Father    Hypertension Sister    Diabetes Sister    Melanoma Maternal Grandmother    Breast cancer Maternal Grandmother        and Aunt   Colon cancer Neg Hx    Esophageal cancer Neg Hx    Rectal cancer Neg Hx    Stomach cancer Neg Hx    Colon polyps Neg Hx     Social History   Socioeconomic History   Marital status: Married    Spouse name: Cornisha Gregston   Number of children: Not on file   Years of education: Not on file   Highest education level: Not on file  Occupational History   Occupation: retired  Tobacco Use   Smoking status: Never   Smokeless tobacco: Never  Vaping Use   Vaping Use: Never used  Substance and Sexual Activity   Alcohol use: No    Alcohol/week: 0.0 standard drinks of alcohol   Drug use: No   Sexual activity: Yes  Other Topics Concern   Not on file  Social History Narrative   Occupation: Production manager   Daily Caffeine use- 1 cup daily      Social Determinants of Health   Financial Resource Strain: Low Risk  (05/01/2021)   Overall Financial Resource Strain (CARDIA)    Difficulty of Paying Living Expenses: Not hard at all  Food Insecurity: No Food Insecurity (06/17/2022)   Hunger Vital Sign    Worried About Running Out of Food in the Last Year: Never true    Ran Out of Food in the Last Year: Never true  Transportation Needs: No Transportation Needs (06/17/2022)   PRAPARE - Hydrologist (Medical): No    Lack of Transportation (Non-Medical): No  Physical Activity: Inactive (06/17/2022)   Exercise Vital Sign     Days of Exercise per Week: 0 days    Minutes of Exercise per Session: 0 min  Stress: No Stress Concern Present (06/17/2022)   [place]  Questionnaire    Feeling of Stress : Not at all  Social Connections: Moderately Integrated (06/17/2022)   Social Connection and Isolation Panel [NHANES]    Frequency of Communication with Friends and Family: More than three times a week    Frequency of Social Gatherings with Friends and Family: More than three times a week    Attends Religious Services: More than 4 times per year    Active Member of [employer] or Organizations: No    Attends Archivist Meetings: Never    Marital Status: Married  Human resources officer Violence: Not At Risk (06/17/2022)   Humiliation, Afraid, Rape, and Kick questionnaire    Fear of Current or Ex-Partner: No    Emotionally Abused: No    Physically Abused: No    Sexually Abused: No    Outpatient Medications Prior to Visit  Medication Sig Dispense Refill   Biotin w/ Vitamins C & E (HAIR SKIN & NAILS GUMMIES PO) Take 1 each by mouth daily.     Calcium Carb-Cholecalciferol (CALTRATE 600+D3 SOFT PO) Take by mouth.     esomeprazole (NEXIUM) 40 MG capsule Take 1 capsule (40 mg total) by mouth daily as needed. Crack open capsule and ingest contents. Please keep your January appointment for further refills. Thank you 30 capsule 1   fluticasone (FLONASE) 50 MCG/ACT nasal spray Use 2 spray(s) in each nostril once daily 16 g 0   vitamin B-12 (CYANOCOBALAMIN) 1000 MCG tablet Take 1,000 mcg by mouth daily.     lisinopril (ZESTRIL) 5 MG tablet Take 1 tablet (5 mg total) by mouth daily. 30 tablet 0   No facility-administered medications prior to visit.    Allergies  Allergen Reactions   Aspirin     REACTION: intolerance-gi upset   Codeine Itching   Morphine Itching   Sulfa Antibiotics Other (See Comments)    Unsure of reaction   Sulfonamide Derivatives     Unsure of  reaction    Review of Systems  Constitutional:  Negative for fever and malaise/fatigue.  HENT:  Negative for congestion.   Eyes:  Negative for blurred vision.  Respiratory:  Negative for shortness of breath.   Cardiovascular:  Negative for chest pain, palpitations and leg swelling.  Gastrointestinal:  Negative for abdominal pain, blood in stool and nausea.  Genitourinary:  Negative for dysuria and frequency.  Musculoskeletal:  Negative for falls.  Skin:  Negative for rash.  Neurological:  Negative for dizziness, loss of consciousness and headaches.  Endo/Heme/Allergies:  Negative for environmental allergies.  Psychiatric/Behavioral:  Negative for depression. The patient is not nervous/anxious.        Objective:    Physical Exam Vitals and nursing note reviewed.  Constitutional:      Appearance: She is well-developed.  HENT:     Head: Normocephalic and atraumatic.  Eyes:     Conjunctiva/sclera: Conjunctivae normal.  Neck:     Thyroid: No thyromegaly.     Vascular: No carotid bruit or JVD.  Cardiovascular:     Rate and Rhythm: Normal rate and regular rhythm.     Heart sounds: Normal heart sounds. No murmur heard. Pulmonary:     Effort: Pulmonary effort is normal. No respiratory distress.     Breath sounds: Normal breath sounds. No wheezing or rales.  Chest:     Chest wall: No tenderness.  Musculoskeletal:     Cervical back: Normal range of motion and neck supple.  Neurological:     Mental Status: She is alert and  oriented to person, place, and time.     BP (!) 140/90 (BP Location: Right Arm, Patient Position: Sitting, Cuff Size: Normal)   Pulse 66   Temp 98.4 F (36.9 C) (Oral)   Resp 18   Ht 5' 2"$  (1.575 m)   Wt 192 lb (87.1 kg)   SpO2 100%   BMI 35.12 kg/m  Wt Readings from Last 3 Encounters:  11/18/22 192 lb (87.1 kg)  10/01/22 189 lb 9.6 oz (86 kg)  09/13/22 191 lb (86.6 kg)       Assessment & Plan:  Essential hypertension Assessment & Plan: Poorly  controlled will alter medications, encouraged DASH diet, minimize caffeine and obtain adequate sleep. Report concerning symptoms and follow up as directed and as needed  Start losartan and f/u 2 weeks or sooner as needed   Orders: -     Losartan Potassium; Take 1 tablet (25 mg total) by mouth daily.  Dispense: 90 tablet; Refill: 1  Hyperlipidemia, unspecified hyperlipidemia type -     Rosuvastatin Calcium; Take 1 tablet (10 mg total) by mouth daily.  Dispense: 90 tablet; Refill: 3     I,Rachel Rivera,acting as a scribe for [employer], DO.,have documented all relevant documentation on the behalf of Ann Held, DO,as directed by  Ann Held, DO while in the presence of Ann Held, DO.   I, Ann Held, DO, personally preformed the services described in this documentation.  All medical record entries made by the scribe were at my direction and in my presence.  I have reviewed the chart and discharge instructions (if applicable) and agree that the record reflects my personal performance and is accurate and complete. 11/18/22   Ann Held, DO

## 2022-11-21 ENCOUNTER — Ambulatory Visit: Payer: Medicare Other | Attending: Cardiology | Admitting: Student

## 2022-11-21 VITALS — BP 155/91 | HR 59

## 2022-11-21 DIAGNOSIS — I10 Essential (primary) hypertension: Secondary | ICD-10-CM

## 2022-11-21 NOTE — Assessment & Plan Note (Signed)
Assessment: BP is uncontrolled (goal <130/80) in office BP 153/90 mmHg upon repetition it was same  Home BP on non validated arm cuff ~134/90 heart rate 70  PCP changed ACEi to ARB 3 days ago  Tolerates Losartan 25 mg well without any side effects Denies SOB, palpitation, chest pain, headaches,or swelling Diet is high protein but does not pay attention to salt; curious to learn about low salt diet  Regular exercise - none due to bad right knee; surgery coming up   Plan:  PCP changed the medication just 3 days ago not making any medication change  Continue taking losartan 25 mg daily Will follow up in 2 weeks via MyChart if BP remains above the goal will up the losartan dose  Patient to bring BP monitor for validation at the next OV Patient to keep record of BP readings with heart rate and report to [place] at the next visit Patient to cut down on salt intake Patient to see PharmD in 4 weeks for follow up  Follow up lab(s) : BMP in 3 weeks

## 2022-11-21 NOTE — Progress Notes (Signed)
Patient ID: [name]                 DOB: 1956-07-11                      MRN: [id number]      HPI: [name] is a 67 y.o. female referred by Dr. Marlou Porch to HTN clinic. PMH is significant for OSA, HTN, GERD, IBS, obesity.  Patient presented today for HTN clinic. Reports she was on amlodipine before which gave her strange chest discomforts at night. Was not sure it was her GERD or the amlodipine. She went to ED for that they had change amlodipine to lisinopril 5 mg (11/07/2022) at the follow up visit with PCP lisinopril was changed to losartan 25 mg (11/18/2022). Patient has been taking losartan now for 5 days. She has had gastric bypass done in 2008 since than she is very careful of what she eats. She eats small 5 meals. She may be making some unhealthy choice for snacks but overall she follows healthy high protein diet. She has gain 30-40 lbs since gastric bypass. She will be going [facility] next [facility]. She checks her BP at home on non validated arm cuff and it ~ 134/90 with HR 70. Patient is curious to know about diet plan that helps with hypertension.    Home BP readings:  Date SBP/DBP  HR   137/93 65   136/95 61   140/94 73   128/89 73   127/85 84   137/96 64  Average 134/90 70   Current HTN meds: Losartan 25 mg daily Previously tried:  amlodipine- discomfort in chest may be from GERD or from amlodipine  BP goal: <130/80    Social History:  Alcohol: occasional Smoking:none   Diet: high protein diet -chicken, fish -had gastric by pass 2006 gained 30 -40 lbs   Drink: 1 coffee in am and rest of the day - water, zero Gatorade    Exercise: due for right knee replacement so does not do any exercise also taking care of elderly parents which consume 90% of time  Use to do Water aerobics most days of the weeks- quit 8 months ago  Willing to restart after knee surgery      Wt Readings from Last 3 Encounters:  11/18/22 192 lb (87.1 kg)  10/01/22  189 lb 9.6 oz (86 kg)  09/13/22 191 lb (86.6 kg)   BP Readings from Last 3 Encounters:  11/21/22 (!) 155/91  11/18/22 (!) 140/90  11/07/22 (!) 163/91   Pulse Readings from Last 3 Encounters:  11/21/22 (!) 59  11/18/22 66  11/07/22 79    Renal function: CrCl cannot be calculated (Patient's most recent lab result is older than the maximum 21 days allowed.).  Past Medical History:  Diagnosis Date   Abdominal pain, other specified site 03/31/2008   Centricity Description: FLANK PAIN, RIGHT Qualifier: Diagnosis of  By: Linna Darner MD, Gwyndolyn Saxon   Centricity Description: ABDOMINAL PAIN OTHER SPECIFIED SITE Qualifier: Diagnosis of  By: Jerold Coombe     Acute sprain or strain of cervical region 04/10/2012   ALLERGIC RHINITIS 06/26/2007   Qualifier: Diagnosis of  By: Marca Ancona RMA, Lucy     ANKYLOSING SPONDYLITIS 03/31/2008   Qualifier: Diagnosis of  By: Linna Darner MD, Gwyndolyn Saxon     Ankylosing spondylitis ([place])    neck and spin   Ankylosing spondylitis of multiple sites in spine ([place])  Arthritis    Back pain    Bariatric surgery status 01/14/2008   Qualifier: Diagnosis of  By: Etter Sjogren DO, Kendrick Fries     Blood in stool 12/02/2008   Qualifier: Diagnosis of  By: Jerold Coombe     Blood transfusion without reported diagnosis    Calf pain 10/13/2012   CHEST PAIN UNSPECIFIED 10/11/2009   Qualifier: Diagnosis of  By: Deatra Ina MD, Sandy Salaam    CONSTIPATION 04/06/2010   Qualifier: Diagnosis of  By: Jerold Coombe     CORNEAL ABRASION, LEFT 01/22/2010   Qualifier: Diagnosis of  By: Jerold Coombe     Gastritis    GERD 06/26/2007   Qualifier: Diagnosis of  By: Marca Ancona RMA, Lucy     GERD (gastroesophageal reflux disease)    Hiatal hernia    Hypertension    no medications now   IBS 06/26/2007   Qualifier: Diagnosis of  By: Marca Ancona RMA, Lucy     Joint pain    Lactose intolerance    LEG CRAMPS, NOCTURNAL 11/14/2010   Qualifier: Diagnosis of  By: Heath Lark     Long-term current use of steroids 04/20/2013    LYMPHADENOPATHY 06/10/2007   Qualifier: Diagnosis of  By: Jerold Coombe     MORBID OBESITY 06/26/2007   Qualifier: Diagnosis of  By: Larose Kells     MUSCLE PAIN 03/31/2008   Qualifier: Diagnosis of  By: Linna Darner MD, William     Noninfectious gastroenteritis and colitis 03/17/2013   Obesity (BMI 30-39.9) 03/16/2014   OSA (obstructive sleep apnea) 03/08/2016   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/23/2010   Qualifier: Diagnosis of  By: Jerold Coombe     Rheumatoid arthritis ([place])    Seasonal allergies 05/14/2015   THYROID CYST 06/26/2007   Qualifier: Diagnosis of  By: Marca Ancona RMA, Lucy     THYROID NODULE, HX OF 06/10/2007   Qualifier: Diagnosis of  By: Jerold Coombe     Vitamin D deficiency     Current Outpatient Medications on File Prior to Visit  Medication Sig Dispense Refill   Biotin w/ Vitamins C & E (HAIR SKIN & NAILS GUMMIES PO) Take 1 each by mouth daily.     Calcium Carb-Cholecalciferol (CALTRATE 600+D3 SOFT PO) Take by mouth.     esomeprazole (NEXIUM) 40 MG capsule Take 1 capsule (40 mg total) by mouth daily as needed. Crack open capsule and ingest contents. Please keep your January appointment for further refills. Thank you 30 capsule 1   fluticasone (FLONASE) 50 MCG/ACT nasal spray Use 2 spray(s) in each nostril once daily 16 g 0   losartan (COZAAR) 25 MG tablet Take 1 tablet (25 mg total) by mouth daily. 90 tablet 1   rosuvastatin (CRESTOR) 10 MG tablet Take 1 tablet (10 mg total) by mouth daily. 90 tablet 3   vitamin B-12 (CYANOCOBALAMIN) 1000 MCG tablet Take 1,000 mcg by mouth daily.     No current facility-administered medications on file prior to visit.    Allergies  Allergen Reactions   Aspirin     REACTION: intolerance-gi upset   Codeine Itching   Morphine Itching   Sulfa Antibiotics Other (See Comments)    Unsure of reaction   Sulfonamide Derivatives     Unsure of reaction    Blood pressure (!) 155/91, pulse (!) 59, SpO2 100 %.    Essential  hypertension Assessment: BP is uncontrolled (goal <130/80) in office BP 153/90 mmHg upon repetition it  was same  Home BP on non validated arm cuff ~134/90 heart rate 70  PCP changed ACEi to ARB 3 days ago  Tolerates Losartan 25 mg well without any side effects Denies SOB, palpitation, chest pain, headaches,or swelling Diet is high protein but does not pay attention to salt; curious to learn about low salt diet  Regular exercise - none due to bad right knee; surgery coming up   Plan:  PCP changed the medication just 3 days ago not making any medication change  Continue taking losartan 25 mg daily Will follow up in 2 weeks via MyChart if BP remains above the goal will up the losartan dose  Patient to bring BP monitor for validation at the next OV Patient to keep record of BP readings with heart rate and report to [place] at the next visit Patient to cut down on salt intake Patient to see PharmD in 4 weeks for follow up  Follow up lab(s) : BMP in 3 weeks       Thank you  Cammy Copa, Pharm.D [facility] A Division of [facility] [place] [facility] [address] [place], [place] [address]  Phone: [phone]; Fax: [phone]

## 2022-11-21 NOTE — Patient Instructions (Addendum)
No changes made by your pharmacist Cammy Copa, PharmD at today's visit:    Bring all of your meds, your BP cuff and your record of home blood pressures to your next appointment.    HOW TO TAKE YOUR BLOOD PRESSURE AT HOME  Rest 5 minutes before taking your blood pressure.  Don't smoke or drink caffeinated beverages for at least 30 minutes before. Take your blood pressure before (not after) you eat. Sit comfortably with your back supported and both feet on the floor (don't cross your legs). Elevate your arm to heart level on a table or a desk. Use the proper sized cuff. It should fit smoothly and snugly around your bare upper arm. There should be enough room to slip a fingertip under the cuff. The bottom edge of the cuff should be 1 inch above the crease of the elbow. Ideally, take 3 measurements at one sitting and record the average.  Important lifestyle changes to control high blood pressure  Intervention  Effect on the BP  Lose extra pounds and watch your waistline Weight loss is one of the most effective lifestyle changes for controlling blood pressure. If you're overweight or obese, losing even a small amount of weight can help reduce blood pressure. Blood pressure might go down by about 1 millimeter of mercury (mm Hg) with each kilogram (about 2.2 pounds) of weight lost.  Exercise regularly As a general goal, aim for at least 30 minutes of moderate physical activity every day. Regular physical activity can lower high blood pressure by about 5 to 8 mm Hg.  Eat a healthy diet Eating a diet rich in whole grains, fruits, vegetables, and low-fat dairy products and low in saturated fat and cholesterol. A healthy diet can lower high blood pressure by up to 11 mm Hg.  Reduce salt (sodium) in your diet Even a small reduction of sodium in the diet can improve heart health and reduce high blood pressure by about 5 to 6 mm Hg.  Limit alcohol One drink equals 12 ounces of beer, 5 ounces of wine,  or 1.5 ounces of 80-proof liquor.  Limiting alcohol to less than one drink a day for women or two drinks a day for men can help lower blood pressure by about 4 mm Hg.   If you have any questions or concerns please use My Chart to send questions or call the office at [phone]  DASH Eating Plan DASH stands for Dietary Approaches to Stop Hypertension. The DASH eating plan is a healthy eating plan that has been shown to: Reduce high blood pressure (hypertension). Reduce your risk for type 2 diabetes, heart disease, and stroke. Help with weight loss. What are tips for following this plan? Reading food labels Check food labels for the amount of salt (sodium) per serving. Choose foods with less than 5 percent of the Daily Value of sodium. Generally, foods with less than 300 milligrams (mg) of sodium per serving fit into this eating plan. To find whole grains, look for the word "whole" as the first word in the ingredient list. Shopping Buy products labeled as "low-sodium" or "no salt added." Buy fresh foods. Avoid canned foods and pre-made or frozen meals. Cooking Avoid adding salt when cooking. Use salt-free seasonings or herbs instead of table salt or sea salt. Check with your health care provider or pharmacist before using salt substitutes. Do not fry foods. Cook foods using healthy methods such as baking, boiling, grilling, roasting, and broiling instead. Cook with heart-healthy oils,  such as olive, canola, avocado, soybean, or sunflower oil. Meal planning  Eat a balanced diet that includes: 4 or more servings of fruits and 4 or more servings of vegetables each day. Try to fill one-half of your plate with fruits and vegetables. 6-8 servings of whole grains each day. Less than 6 oz (170 g) of lean meat, poultry, or fish each day. A 3-oz (85-g) serving of meat is about the same size as a deck of cards. One egg equals 1 oz (28 g). 2-3 servings of low-fat dairy each day. One serving is 1 cup  (237 mL). 1 serving of nuts, seeds, or beans 5 times each week. 2-3 servings of heart-healthy fats. Healthy fats called omega-3 fatty acids are found in foods such as walnuts, flaxseeds, fortified milks, and eggs. These fats are also found in cold-water fish, such as sardines, salmon, and mackerel. Limit how much you eat of: Canned or prepackaged foods. Food that is high in trans fat, such as some fried foods. Food that is high in saturated fat, such as fatty meat. Desserts and other sweets, sugary drinks, and other foods with added sugar. Full-fat dairy products. Do not salt foods before eating. Do not eat more than 4 egg yolks a week. Try to eat at least 2 vegetarian meals a week. Eat more home-cooked food and less restaurant, buffet, and fast food. Lifestyle When eating at a restaurant, ask that your food be prepared with less salt or no salt, if possible. If you drink alcohol: Limit how much you use to: 0-1 drink a day for women who are not pregnant. 0-2 drinks a day for men. Be aware of how much alcohol is in your drink. In the [place] one drink equals one 12 oz bottle of beer (355 mL), one 5 oz glass of wine (148 mL), or one 1 oz glass of hard liquor (44 mL). General information Avoid eating more than 2,300 mg of salt a day. If you have hypertension, you may need to reduce your sodium intake to 1,500 mg a day. Work with your health care provider to maintain a healthy body weight or to lose weight. Ask what an ideal weight is for you. Get at least 30 minutes of exercise that causes your heart to beat faster (aerobic exercise) most days of the week. Activities may include walking, swimming, or biking. Work with your health care provider or dietitian to adjust your eating plan to your individual calorie needs. What foods should I eat? Fruits All fresh, dried, or frozen fruit. Canned fruit in natural juice (without added sugar). Vegetables Fresh or frozen vegetables (raw, steamed,  roasted, or grilled). Low-sodium or reduced-sodium tomato and vegetable juice. Low-sodium or reduced-sodium tomato sauce and tomato paste. Low-sodium or reduced-sodium canned vegetables. Grains Whole-grain or whole-wheat bread. Whole-grain or whole-wheat pasta. Brown rice. [place]. Bulgur. Whole-grain and low-sodium cereals. Pita bread. Low-fat, low-sodium crackers. Whole-wheat flour tortillas. Meats and other proteins Skinless chicken or [place]. Ground chicken or [place]. Pork with fat trimmed off. Fish and seafood. Egg whites. Dried beans, peas, or lentils. Unsalted nuts, nut butters, and seeds. Unsalted canned beans. Lean cuts of beef with fat trimmed off. Low-sodium, lean precooked or cured meat, such as sausages or meat loaves. Dairy Low-fat (1%) or fat-free (skim) milk. Reduced-fat, low-fat, or fat-free cheeses. Nonfat, low-sodium ricotta or cottage cheese. Low-fat or nonfat yogurt. Low-fat, low-sodium cheese. Fats and oils Soft margarine without trans fats. Vegetable oil. Reduced-fat, low-fat, or light mayonnaise and salad dressings (reduced-sodium).  Canola, safflower, olive, avocado, soybean, and sunflower oils. Avocado. Seasonings and condiments Herbs. Spices. Seasoning mixes without salt. Other foods Unsalted popcorn and pretzels. Fat-free sweets. The items listed above may not be a complete list of foods and beverages you can eat. Contact a dietitian for more information. What foods should I avoid? Fruits Canned fruit in a light or heavy syrup. Fried fruit. Fruit in cream or butter sauce. Vegetables Creamed or fried vegetables. Vegetables in a cheese sauce. Regular canned vegetables (not low-sodium or reduced-sodium). Regular canned tomato sauce and paste (not low-sodium or reduced-sodium). Regular tomato and vegetable juice (not low-sodium or reduced-sodium). Angie Fava. Olives. Grains Baked goods made with fat, such as croissants, muffins, or some breads. Dry pasta or rice meal  packs. Meats and other proteins Fatty cuts of meat. Ribs. Fried meat. Berniece Salines. Bologna, salami, and other precooked or cured meats, such as sausages or meat loaves. Fat from the back of a pig (fatback). Bratwurst. Salted nuts and seeds. Canned beans with added salt. Canned or smoked fish. Whole eggs or egg yolks. Chicken or [place] with skin. Dairy Whole or 2% milk, cream, and half-and-half. Whole or full-fat cream cheese. Whole-fat or sweetened yogurt. Full-fat cheese. Nondairy creamers. Whipped toppings. Processed cheese and cheese spreads. Fats and oils Butter. Stick margarine. Lard. Shortening. Ghee. Bacon fat. Tropical oils, such as coconut, palm kernel, or palm oil. Seasonings and condiments Onion salt, garlic salt, seasoned salt, table salt, and sea salt. Worcestershire sauce. Tartar sauce. Barbecue sauce. Teriyaki sauce. Soy sauce, including reduced-sodium. Steak sauce. Canned and packaged gravies. Fish sauce. Oyster sauce. Cocktail sauce. Store-bought horseradish. Ketchup. Mustard. Meat flavorings and tenderizers. Bouillon cubes. Hot sauces. Pre-made or packaged marinades. Pre-made or packaged taco seasonings. Relishes. Regular salad dressings. Other foods Salted popcorn and pretzels. The items listed above may not be a complete list of foods and beverages you should avoid. Contact a dietitian for more information. Where to find more information National Heart, Lung, and Blood Institute: https://wilson-eaton.com/ American Heart Association: www.heart.org Academy of Nutrition and Dietetics: www.eatright.[place]: www.kidney.org Summary The DASH eating plan is a healthy eating plan that has been shown to reduce high blood pressure (hypertension). It may also reduce your risk for type 2 diabetes, heart disease, and stroke. When on the DASH eating plan, aim to eat more fresh fruits and vegetables, whole grains, lean proteins, low-fat dairy, and heart-healthy fats. With the DASH  eating plan, you should limit salt (sodium) intake to 2,300 mg a day. If you have hypertension, you may need to reduce your sodium intake to 1,500 mg a day. Work with your health care provider or dietitian to adjust your eating plan to your individual calorie needs. This information is not intended to replace advice given to you by your health care provider. Make sure you discuss any questions you have with your health care provider. Document Revised: 08/20/2019 Document Reviewed: 08/20/2019 Elsevier Patient Education  [place].

## 2022-11-26 DIAGNOSIS — M1711 Unilateral primary osteoarthritis, right knee: Secondary | ICD-10-CM | POA: Diagnosis not present

## 2022-11-28 NOTE — Progress Notes (Deleted)
Office Visit Note  Patient: [name]             Date of Birth: 1956-02-18           MRN: [id number]             PCP: Ann Held, DO Referring: Ann Held, * Visit Date: 12/12/2022 Occupation: '@GUAROCC'$ @  Subjective:  No chief complaint on file.   History of Present Illness: [name] is a 67 y.o. female ***patient returns today after her last and initial visit 12 November 17, 2017.  Patient had left total hip replacement at age 46 and then revision 10 years later after fall.  She had hip and pelvis replacement in [place] in 2010.  She started having neck and back pain in her 38s and 40s.  She was under care of Dr. Caprice Beaver at 1 time and was diagnosed with ankylosing spondylitis and was a started on Enbrel.  She discontinued Enbrel after she read about the side effects of the medications.  She took prednisone taper for the flares.  After Dr. Caprice Beaver retired she started seeing Dr. Truddie Coco.  She started on prednisone 5 mg p.o. daily and then stopped it.  She tried physical therapy for short time.  She has had recurrent right knee joint discomfort.  In 2019 she was having recurrent iritis when she came to see me.  We discussed possible use of Cosentyx at the last visit.  She did not return for follow-up visit.    Activities of Daily Living:  Patient reports morning stiffness for *** {minute/hour:19697}.   Patient {ACTIONS;DENIES/REPORTS:21021675::"Denies"} nocturnal pain.  Difficulty dressing/grooming: {ACTIONS;DENIES/REPORTS:21021675::"Denies"} Difficulty climbing stairs: {ACTIONS;DENIES/REPORTS:21021675::"Denies"} Difficulty getting out of chair: {ACTIONS;DENIES/REPORTS:21021675::"Denies"} Difficulty using hands for taps, buttons, cutlery, and/or writing: {ACTIONS;DENIES/REPORTS:21021675::"Denies"}  No Rheumatology ROS completed.   PMFS History:  Patient Active Problem List   Diagnosis Date Noted   Preventative health care 10/01/2022   Elevated  serum creatinine 06/26/2021   COVID-19 04/26/2021   Diverticulitis 02/20/2021   Depression 07/10/2020   History of gastric bypass 07/10/2020   Has daytime drowsiness 05/09/2020   Other fatigue 02/29/2020   Short of breath on exertion 02/29/2020   Vitamin D deficiency 08/11/2018   Gastric bypass status for obesity 07/30/2018   Hives 06/07/2018   Primary osteoarthritis of right knee 01/23/2018   Primary osteoarthritis of both hands 01/23/2018   History of iritis 11/28/2017   History of total hip replacement, left 11/28/2017   Primary osteoarthritis of both knees 11/28/2017   Obstructive sleep apnea 03/08/2016   Seasonal allergies 05/14/2015   Obesity (BMI 30-39.9) 03/16/2014   Long-term current use of steroids 04/20/2013   Noninfectious gastroenteritis and colitis 03/17/2013   Calf pain 10/13/2012   Acute sprain or strain of cervical region 04/10/2012   LEG CRAMPS, NOCTURNAL 11/14/2010   OTHER SPECIFIED DISEASE OF WHITE BLOOD CELLS 07/23/2010   CONSTIPATION 04/06/2010   CORNEAL ABRASION, LEFT 01/22/2010   CHEST PAIN UNSPECIFIED 10/11/2009   BLOOD IN STOOL 12/02/2008   ANKYLOSING SPONDYLITIS 03/31/2008   MUSCLE PAIN 03/31/2008   BARIATRIC SURGERY STATUS 01/14/2008   THYROID CYST 06/26/2007   Class 2 severe obesity with serious comorbidity and body mass index (BMI) of 36.0 to 36.9 in adult [facility]) 06/26/2007   Essential hypertension 06/26/2007   GERD 06/26/2007   IBS 06/26/2007   LYMPHADENOPATHY 06/10/2007   History of thyroid nodule 06/10/2007    Past Medical History:  Diagnosis Date   Abdominal pain, other specified  site 03/31/2008   Centricity Description: FLANK PAIN, RIGHT Qualifier: Diagnosis of  By: Linna Darner MD, Gwyndolyn Saxon   Centricity Description: ABDOMINAL PAIN OTHER SPECIFIED SITE Qualifier: Diagnosis of  By: Jerold Coombe     Acute sprain or strain of cervical region 04/10/2012   ALLERGIC RHINITIS 06/26/2007   Qualifier: Diagnosis of  By: Marca Ancona RMA, [place]     ANKYLOSING  SPONDYLITIS 03/31/2008   Qualifier: Diagnosis of  By: Linna Darner MD, Gwyndolyn Saxon     Ankylosing spondylitis ([place])    neck and spin   Ankylosing spondylitis of multiple sites in spine [facility])    Arthritis    Back pain    Bariatric surgery status 01/14/2008   Qualifier: Diagnosis of  By: Etter Sjogren DO, Kendrick Fries     Blood in stool 12/02/2008   Qualifier: Diagnosis of  By: Jerold Coombe     Blood transfusion without reported diagnosis    Calf pain 10/13/2012   CHEST PAIN UNSPECIFIED 10/11/2009   Qualifier: Diagnosis of  By: Deatra Ina MD, Sandy Salaam    CONSTIPATION 04/06/2010   Qualifier: Diagnosis of  By: Lowne DO, [place], LEFT 01/22/2010   Qualifier: Diagnosis of  By: Jerold Coombe     Gastritis    GERD 06/26/2007   Qualifier: Diagnosis of  By: Marca Ancona RMA, [place]     GERD (gastroesophageal reflux disease)    Hiatal hernia    Hypertension    no medications now   IBS 06/26/2007   Qualifier: Diagnosis of  By: Marca Ancona RMA, [place]     Joint pain    Lactose intolerance    LEG CRAMPS, NOCTURNAL 11/14/2010   Qualifier: Diagnosis of  By: Heath Lark     Long-term current use of steroids 04/20/2013   LYMPHADENOPATHY 06/10/2007   Qualifier: Diagnosis of  By: Lowne DO, [place] OBESITY 06/26/2007   Qualifier: Diagnosis of  By: Larose Kells     MUSCLE PAIN 03/31/2008   Qualifier: Diagnosis of  By: Linna Darner MD, [place]     Noninfectious gastroenteritis and colitis 03/17/2013   Obesity (BMI 30-39.9) 03/16/2014   OSA (obstructive sleep apnea) 03/08/2016   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/23/2010   Qualifier: Diagnosis of  By: Jerold Coombe     Rheumatoid arthritis ([place])    Seasonal allergies 05/14/2015   THYROID CYST 06/26/2007   Qualifier: Diagnosis of  By: Marca Ancona RMA, Lucy     THYROID NODULE, HX OF 06/10/2007   Qualifier: Diagnosis of  By: Jerold Coombe     Vitamin D deficiency     Family History  Problem Relation Age of Onset   Hypertension Mother    High Cholesterol  Mother    Diabetes Mother    Bladder Cancer Mother    Arthritis Father    Hypertension Father    Diabetes Father    High Cholesterol Father    Hypertension Sister    Diabetes Sister    Melanoma Maternal Grandmother    Breast cancer Maternal Grandmother        and Aunt   Colon cancer Neg Hx    Esophageal cancer Neg Hx    Rectal cancer Neg Hx    Stomach cancer Neg Hx    Colon polyps Neg Hx    Past Surgical History:  Procedure Laterality Date   BARIATRIC [place]   COLONOSCOPY  10/24/2009  kaplan-normal exam   ESOPHAGOGASTRODUODENOSCOPY     pelvis replacement  2010   TOTAL HIP ARTHROPLASTY     x 2 left, one revision   UPPER GASTROINTESTINAL ENDOSCOPY     Social History   Social History Narrative   Occupation: Production manager   Daily Caffeine use- 1 cup daily      Immunization History  Administered Date(s) Administered   COVID-19, mRNA, vaccine(Comirnaty)12 years and older 09/03/2022   Influenza Whole 06/22/2010   PFIZER(Purple Top)SARS-COV-2 Vaccination 11/12/2019, 12/07/2019, 09/14/2020   Pfizer Covid-19 Vaccine Bivalent Booster 37yr & up 10/05/2021   Tdap 03/16/2014     Objective: Vital Signs: There were no vitals taken for this visit.   Physical Exam   Musculoskeletal Exam: ***  CDAI Exam: CDAI Score: -- Patient Global: --; Provider Global: -- Swollen: --; Tender: -- Joint Exam 12/12/2022   No joint exam has been documented for this visit   There is currently no information documented on the homunculus. Go to the Rheumatology activity and complete the homunculus joint exam.  Investigation: No additional findings.  Imaging: No results found.  Recent Labs: Lab Results  Component Value Date   WBC 5.4 10/01/2022   HGB 13.8 10/01/2022   PLT 289.0 10/01/2022   NA 140 10/01/2022   K 4.4 10/01/2022   CL 103 10/01/2022   CO2 27 10/01/2022   GLUCOSE 88 10/01/2022   BUN 18 10/01/2022   CREATININE 0.54  10/01/2022   BILITOT 0.6 10/01/2022   ALKPHOS 69 10/01/2022   AST 21 10/01/2022   ALT 13 10/01/2022   PROT 6.7 10/01/2022   ALBUMIN 4.0 10/01/2022   CALCIUM 9.6 10/01/2022   GFRAA >60 04/06/2020   QFTBGOLDPLUS NEGATIVE 11/17/2017   X-rays of the cervical spine obtained in 2019 are consistent with ankylosing spondylitis.  X-rays of the lumbar spine were unremarkable except for facet joint arthropathy.  X-rays of the which showed left SI joint fusion and right SI joint partial fusion.  Prosthesis was noted in the left hip.  X-rays of right knee joint showed moderate osteoarthritis and severe chondromalacia patella.  X-rays of bilateral hands were consistent with osteoarthritis.   Speciality Comments: No specialty comments available.  Procedures:  No procedures performed Allergies: Aspirin, Codeine, Morphine, Sulfa antibiotics, and Sulfonamide derivatives   Assessment / Plan:     Visit Diagnoses: No diagnosis found.  Orders: No orders of the defined types were placed in this encounter.  No orders of the defined types were placed in this encounter.   Face-to-face time spent with patient was *** minutes. Greater than 50% of time was spent in counseling and coordination of care.  Follow-Up Instructions: No follow-ups on file.   SBo Merino MD  Note - This record has been created using DEditor, commissioning  Chart creation errors have been sought, but may not always  have been located. Such creation errors do not reflect on  the standard of medical care.

## 2022-12-03 ENCOUNTER — Encounter (HOSPITAL_COMMUNITY): Payer: Self-pay | Admitting: *Deleted

## 2022-12-03 ENCOUNTER — Ambulatory Visit (HOSPITAL_COMMUNITY)
Admission: EM | Admit: 2022-12-03 | Discharge: 2022-12-03 | Disposition: A | Discharge status: Home / Self Care | Payer: Medicare Other | Attending: Family Medicine | Admitting: Family Medicine

## 2022-12-03 ENCOUNTER — Telehealth: Payer: Self-pay

## 2022-12-03 ENCOUNTER — Ambulatory Visit: Payer: Medicare Other

## 2022-12-03 DIAGNOSIS — L03011 Cellulitis of right finger: Secondary | ICD-10-CM | POA: Diagnosis not present

## 2022-12-03 MED ORDER — AMOXICILLIN-POT CLAVULANATE 875-125 MG PO TABS: 1.0000 | ORAL_TABLET | Freq: Two times a day (BID) | ORAL | 0 refills | Status: DC

## 2022-12-03 NOTE — Discharge Instructions (Signed)
You were seen today for infection around the thumb nail.  There is nothing to open/drain today.  I have sent out an oral antibiotic to take twice/day x 7 days.  I recommend warm epson salt soaks.  If you notice an area that needs to be opened/drained then you may return for this, otherwise return if not improving.

## 2022-12-03 NOTE — ED Provider Notes (Signed)
[facility]    CSN: [id number] Arrival date & time: 12/03/22  0800      History   Chief Complaint Chief Complaint  Patient presents with   Hand Pain    HPI [name] is a 67 y.o. female.   Two week ago she got her nails done.  About a week ago she started with pain at the right thumb.  Throbbing, warm to the touch right around the nail bed.  No oozing or drainage noted.  She has used otc topicals without help.  This has happened in the past a long time ago.        Past Medical History:  Diagnosis Date   Abdominal pain, other specified site 03/31/2008   Centricity Description: FLANK PAIN, RIGHT Qualifier: Diagnosis of  By: Linna Darner MD, William   Centricity Description: ABDOMINAL PAIN OTHER SPECIFIED SITE Qualifier: Diagnosis of  By: Jerold Coombe     Acute sprain or strain of cervical region 04/10/2012   ALLERGIC RHINITIS 06/26/2007   Qualifier: Diagnosis of  By: Marca Ancona RMA, Lucy     ANKYLOSING SPONDYLITIS 03/31/2008   Qualifier: Diagnosis of  By: Linna Darner MD, William     Ankylosing spondylitis ([place])    neck and spin   Ankylosing spondylitis of multiple sites in spine [facility])    Arthritis    Back pain    Bariatric surgery status 01/14/2008   Qualifier: Diagnosis of  By: Etter Sjogren DO, Kendrick Fries     Blood in stool 12/02/2008   Qualifier: Diagnosis of  By: Jerold Coombe     Blood transfusion without reported diagnosis    Calf pain 10/13/2012   CHEST PAIN UNSPECIFIED 10/11/2009   Qualifier: Diagnosis of  By: Deatra Ina MD, Sandy Salaam    CONSTIPATION 04/06/2010   Qualifier: Diagnosis of  By: Jerold Coombe     CORNEAL ABRASION, LEFT 01/22/2010   Qualifier: Diagnosis of  By: Jerold Coombe     Gastritis    GERD 06/26/2007   Qualifier: Diagnosis of  By: Marca Ancona RMA, Lucy     GERD (gastroesophageal reflux disease)    Hiatal hernia    Hypertension    no medications now   IBS 06/26/2007   Qualifier: Diagnosis of  By: Marca Ancona RMA, Lucy     Joint pain    Lactose intolerance     LEG CRAMPS, NOCTURNAL 11/14/2010   Qualifier: Diagnosis of  By: Heath Lark     Long-term current use of steroids 04/20/2013   LYMPHADENOPATHY 06/10/2007   Qualifier: Diagnosis of  By: Jerold Coombe     MORBID OBESITY 06/26/2007   Qualifier: Diagnosis of  By: Larose Kells     MUSCLE PAIN 03/31/2008   Qualifier: Diagnosis of  By: Linna Darner MD, William     Noninfectious gastroenteritis and colitis 03/17/2013   Obesity (BMI 30-39.9) 03/16/2014   OSA (obstructive sleep apnea) 03/08/2016   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/23/2010   Qualifier: Diagnosis of  By: Jerold Coombe     Rheumatoid arthritis ([place])    Seasonal allergies 05/14/2015   THYROID CYST 06/26/2007   Qualifier: Diagnosis of  By: Marca Ancona RMA, Lucy     THYROID NODULE, HX OF 06/10/2007   Qualifier: Diagnosis of  By: Jerold Coombe     Vitamin D deficiency     Patient Active Problem List   Diagnosis Date Noted   Preventative health care 10/01/2022   Elevated serum creatinine 06/26/2021  COVID-19 04/26/2021   Diverticulitis 02/20/2021   Depression 07/10/2020   History of gastric bypass 07/10/2020   Has daytime drowsiness 05/09/2020   Other fatigue 02/29/2020   Short of breath on exertion 02/29/2020   Vitamin D deficiency 08/11/2018   Gastric bypass status for obesity 07/30/2018   Hives 06/07/2018   Primary osteoarthritis of right knee 01/23/2018   Primary osteoarthritis of both hands 01/23/2018   History of iritis 11/28/2017   History of total hip replacement, left 11/28/2017   Primary osteoarthritis of both knees 11/28/2017   Obstructive sleep apnea 03/08/2016   Seasonal allergies 05/14/2015   Obesity (BMI 30-39.9) 03/16/2014   Long-term current use of steroids 04/20/2013   Noninfectious gastroenteritis and colitis 03/17/2013   Calf pain 10/13/2012   Acute sprain or strain of cervical region 04/10/2012   LEG CRAMPS, NOCTURNAL 11/14/2010   OTHER SPECIFIED DISEASE OF WHITE BLOOD CELLS 07/23/2010    CONSTIPATION 04/06/2010   CORNEAL ABRASION, LEFT 01/22/2010   CHEST PAIN UNSPECIFIED 10/11/2009   BLOOD IN STOOL 12/02/2008   ANKYLOSING SPONDYLITIS 03/31/2008   MUSCLE PAIN 03/31/2008   BARIATRIC SURGERY STATUS 01/14/2008   THYROID CYST 06/26/2007   Class 2 severe obesity with serious comorbidity and body mass index (BMI) of 36.0 to 36.9 in adult [facility]) 06/26/2007   Essential hypertension 06/26/2007   GERD 06/26/2007   IBS 06/26/2007   LYMPHADENOPATHY 06/10/2007   History of thyroid nodule 06/10/2007    Past Surgical History:  Procedure Laterality Date   BARIATRIC SURGERY     BREAST REDUCTION SURGERY  1996   COLONOSCOPY  10/24/2009   kaplan-normal exam   ESOPHAGOGASTRODUODENOSCOPY     pelvis replacement  2010   TOTAL HIP ARTHROPLASTY     x 2 left, one revision   UPPER GASTROINTESTINAL ENDOSCOPY      OB History     Gravida  0   Para  0   Term  0   Preterm  0   AB  0   Living  0      SAB  0   IAB  0   Ectopic  0   Multiple  0   Live Births  0            Home Medications    Prior to Admission medications   Medication Sig Start Date End Date Taking? Authorizing Provider  Biotin w/ Vitamins C & E (HAIR SKIN & NAILS GUMMIES PO) Take 1 each by mouth daily.   Yes [provider]  Calcium Carb-Cholecalciferol (CALTRATE 600+D3 SOFT PO) Take by mouth.   Yes [provider]  esomeprazole (NEXIUM) 40 MG capsule Take 1 capsule (40 mg total) by mouth daily as needed. Crack open capsule and ingest contents. Please keep your January appointment for further refills. Thank you 09/05/21  Yes Armbruster, Carlota Raspberry, MD  fluticasone [facility]) 50 MCG/ACT nasal spray Use 2 spray(s) in each nostril once daily 06/01/21  Yes Lowne Lyndal Pulley R, DO  ibuprofen (ADVIL) 400 MG tablet Take 400 mg by mouth 2 (two) times daily. 08/15/22  Yes [provider]  losartan (COZAAR) 25 MG tablet Take 1 tablet (25 mg total) by mouth daily. 11/18/22  Yes Roma Schanz R, DO  vitamin B-12 (CYANOCOBALAMIN) 1000 MCG tablet Take 1,000 mcg by mouth daily.   Yes [provider]  rosuvastatin (CRESTOR) 10 MG tablet Take 1 tablet (10 mg total) by mouth daily. 11/18/22   Ann Held, DO  Family History Family History  Problem Relation Age of Onset   Hypertension Mother    High Cholesterol Mother    Diabetes Mother    Bladder Cancer Mother    Arthritis Father    Hypertension Father    Diabetes Father    High Cholesterol Father    Hypertension Sister    Diabetes Sister    Melanoma Maternal Grandmother    Breast cancer Maternal Grandmother        and Aunt   Colon cancer Neg Hx    Esophageal cancer Neg Hx    Rectal cancer Neg Hx    Stomach cancer Neg Hx    Colon polyps Neg Hx     Social History Social History   Tobacco Use   Smoking status: Never   Smokeless tobacco: Never  Vaping Use   Vaping Use: Never used  Substance Use Topics   Alcohol use: No    Alcohol/week: 0.0 standard drinks of alcohol   Drug use: No     Allergies   Aspirin, Codeine, Morphine, Sulfa antibiotics, and Sulfonamide derivatives   Review of Systems Review of Systems  Constitutional: Negative.   HENT: Negative.    Respiratory: Negative.    Gastrointestinal: Negative.   Musculoskeletal:  Positive for arthralgias.     Physical Exam Triage Vital Signs ED Triage Vitals  Enc Vitals Group     BP 12/03/22 0820 (!) 158/93     Pulse Rate 12/03/22 0820 63     Resp 12/03/22 0820 18     Temp 12/03/22 0820 98.5 F (36.9 C)     Temp Source 12/03/22 0820 Oral     SpO2 12/03/22 0820 99 %     Weight --      Height --      Head Circumference --      Peak Flow --      Pain Score 12/03/22 0818 0     Pain Loc --      Pain Edu? --      Excl. in [place]? --    No data found.  Updated Vital Signs BP (!) 158/93 (BP Location: Right Arm)   Pulse 63   Temp 98.5 F (36.9 C) (Oral)   Resp 18   SpO2 99%   Visual Acuity Right Eye  Distance:   Left Eye Distance:   Bilateral Distance:    Right Eye Near:   Left Eye Near:    Bilateral Near:     Physical Exam Constitutional:      Appearance: Normal appearance.  Cardiovascular:     Rate and Rhythm: Normal rate.  Pulmonary:     Effort: Pulmonary effort is normal.  Skin:    Comments: Slight redness to the medial aspect of the right thumb nail bed;  tender;  no pus fill area or fluctuance noted  Neurological:     General: No focal deficit present.     Mental Status: She is alert.  Psychiatric:        Mood and Affect: Mood normal.      UC Treatments / Results  Labs (all labs ordered are listed, but only abnormal results are displayed) Labs Reviewed - No data to display  EKG   Radiology No results found.  Procedures Procedures (including critical care time)  Medications Ordered in UC Medications - No data to display  Initial Impression / Assessment and Plan / UC Course  I have reviewed the triage vital signs and the nursing notes.  Pertinent labs & imaging results that were available during my care of the patient were reviewed by me and considered in my medical decision making (see chart for details).   Final Clinical Impressions(s) / UC Diagnoses   Final diagnoses:  Paronychia of thumb, right     Discharge Instructions      You were seen today for infection around the thumb nail.  There is nothing to open/drain today.  I have sent out an oral antibiotic to take twice/day x 7 days.  I recommend warm epson salt soaks.  If you notice an area that needs to be opened/drained then you may return for this, otherwise return if not improving.     ED Prescriptions     Medication Sig Dispense Auth. Provider   amoxicillin-clavulanate (AUGMENTIN) 875-125 MG tablet Take 1 tablet by mouth every 12 (twelve) hours. 14 tablet Rondel Oh, MD      PDMP not reviewed this encounter.   Rondel Oh, MD 12/03/22 [phone]

## 2022-12-03 NOTE — Telephone Encounter (Signed)
Received form for pre-op for right total knee arthoplasty. Surgery is scheduled for 01/28/23. Pt needs pre-op appointment please.

## 2022-12-03 NOTE — ED Triage Notes (Signed)
Pt states she had her nails done 2 weeks ago and since she is having pain in her right thumb. She states that its painful and swollen. She has been using antifungal and alcohol.

## 2022-12-04 ENCOUNTER — Telehealth: Payer: Self-pay | Admitting: Cardiology

## 2022-12-04 ENCOUNTER — Encounter (HOSPITAL_COMMUNITY): Admission: RE | Admit: 2022-12-04 | Payer: Medicare Other | Source: Ambulatory Visit

## 2022-12-04 NOTE — Telephone Encounter (Signed)
Spoke with patient explained per Dr. Evelene Croon recommendation: Ok to continue lisinopril if patient is tolerating. Recommend increasing dose to '10mg'$  however.   Patient voiced understanding

## 2022-12-04 NOTE — Telephone Encounter (Signed)
Received STAT call that pt was previously  symptomatic ( chest pain ). Spoke to the patient for the past 3 days she was experiencing chest pain and heaviness with both arms.Denies any other symptoms. Pt stated she did not start experiencing these symptoms until she start taking losartan. She decided to discontinue the losartan this morning and resume taking lisinopril. Blood pressure reading this am 148/91 74 p. MD and nurse not office. Will forward to Pharm D.

## 2022-12-04 NOTE — Telephone Encounter (Signed)
Pt c/o medication issue:  1. Name of Medication: losartan (COZAAR) 25 MG tablet   2. How are you currently taking this medication (dosage and times per day)?   Take 1 tablet (25 mg total) by mouth daily.    3. Are you having a reaction (difficulty breathing--STAT)? No  4. What is your medication issue? Although Losartan has been improving her bp, it's also been giving her a heaviness in her chest. It's been for the past 3 nights that she's felt this heaviness. She is not having the heaviness now. Since she needs to be on a bp med, she decided to put herself back on lisinopril until she could talk with the doctor and/or pharmacist.    Pt c/o of Chest Pain: STAT if CP now or developed within 24 hours  1. Are you having CP right now? She says it feels better right now. The discomfort is more at night. It's more heaviness than pain. The last time she felt this heaviness was lastnight.   2. Are you experiencing any other symptoms (ex. SOB, nausea, vomiting, sweating)? Not sob. Having pain in both arms, as well on top of right shoulder.   3. How long have you been experiencing CP? Every night x3 nights.  4. Is your CP continuous or coming and going? Comes and goes.  5. Have you taken Nitroglycerin? No, but did take tylenol, it helped her.   Call transferred to triage

## 2022-12-04 NOTE — Telephone Encounter (Signed)
Lvm for patient to call back and schedule appt  

## 2022-12-04 NOTE — Telephone Encounter (Signed)
Ok to continue lisinopril if patient is tolerating. Recommend increasing dose to '10mg'$  however.

## 2022-12-05 ENCOUNTER — Encounter: Payer: Self-pay | Admitting: Family Medicine

## 2022-12-05 MED ORDER — AMLODIPINE BESYLATE 5 MG PO TABS: 5.0000 mg | ORAL_TABLET | Freq: Every day | ORAL | 2 refills | Status: DC

## 2022-12-06 ENCOUNTER — Encounter: Payer: Self-pay | Admitting: Family Medicine

## 2022-12-06 MED ORDER — CARVEDILOL 6.25 MG PO TABS: 6.2500 mg | ORAL_TABLET | Freq: Two times a day (BID) | ORAL | 2 refills | Status: DC

## 2022-12-10 ENCOUNTER — Other Ambulatory Visit: Payer: Self-pay | Admitting: Family Medicine

## 2022-12-10 DIAGNOSIS — I10 Essential (primary) hypertension: Secondary | ICD-10-CM

## 2022-12-10 MED ORDER — LISINOPRIL 10 MG PO TABS: 10.0000 mg | ORAL_TABLET | Freq: Every day | ORAL | 3 refills | Status: DC

## 2022-12-10 NOTE — Telephone Encounter (Signed)
Pt called stating the following:  Amlodipine gives her chest discomfort. Carvedilol doesn't agree with her. Losartan gives her similar issues to Carvedilol. Lisinopril 10 mg seems to be the only medication that seems to be working properly and wants to look into having this prescribed for her. She has currently been taking it for a week and it seems to be doing well. Please Advise.

## 2022-12-11 ENCOUNTER — Telehealth: Payer: Self-pay | Admitting: Pharmacist

## 2022-12-11 NOTE — Telephone Encounter (Signed)
Called patient to follow up on BP medication- tolerating Lisinopril well her BP ~ 136/83. Encourage her to keep log of BP and bring readings and meter for validation at the next OV on 12/23/2022.

## 2022-12-12 ENCOUNTER — Ambulatory Visit: Payer: Medicare Other | Admitting: Rheumatology

## 2022-12-12 DIAGNOSIS — M542 Cervicalgia: Secondary | ICD-10-CM

## 2022-12-12 DIAGNOSIS — M19042 Primary osteoarthritis, left hand: Secondary | ICD-10-CM

## 2022-12-12 DIAGNOSIS — J302 Other seasonal allergic rhinitis: Secondary | ICD-10-CM

## 2022-12-12 DIAGNOSIS — G8929 Other chronic pain: Secondary | ICD-10-CM

## 2022-12-12 DIAGNOSIS — F3289 Other specified depressive episodes: Secondary | ICD-10-CM

## 2022-12-12 DIAGNOSIS — M450 Ankylosing spondylitis of multiple sites in spine: Secondary | ICD-10-CM

## 2022-12-12 DIAGNOSIS — Z96642 Presence of left artificial hip joint: Secondary | ICD-10-CM

## 2022-12-12 DIAGNOSIS — E559 Vitamin D deficiency, unspecified: Secondary | ICD-10-CM

## 2022-12-12 DIAGNOSIS — Z9884 Bariatric surgery status: Secondary | ICD-10-CM

## 2022-12-12 DIAGNOSIS — K5792 Diverticulitis of intestine, part unspecified, without perforation or abscess without bleeding: Secondary | ICD-10-CM

## 2022-12-12 DIAGNOSIS — Z8639 Personal history of other endocrine, nutritional and metabolic disease: Secondary | ICD-10-CM

## 2022-12-12 DIAGNOSIS — G4733 Obstructive sleep apnea (adult) (pediatric): Secondary | ICD-10-CM

## 2022-12-12 DIAGNOSIS — Z7952 Long term (current) use of systemic steroids: Secondary | ICD-10-CM

## 2022-12-12 DIAGNOSIS — Z8669 Personal history of other diseases of the nervous system and sense organs: Secondary | ICD-10-CM

## 2022-12-12 DIAGNOSIS — I10 Essential (primary) hypertension: Secondary | ICD-10-CM

## 2022-12-12 DIAGNOSIS — M545 Low back pain, unspecified: Secondary | ICD-10-CM

## 2022-12-12 DIAGNOSIS — Z8719 Personal history of other diseases of the digestive system: Secondary | ICD-10-CM

## 2022-12-12 DIAGNOSIS — M170 Bilateral primary osteoarthritis of knee: Secondary | ICD-10-CM

## 2022-12-17 ENCOUNTER — Ambulatory Visit: Admit: 2022-12-17 | Payer: Medicare Other | Admitting: Orthopedic Surgery

## 2022-12-17 SURGERY — ARTHROPLASTY, KNEE, TOTAL
Anesthesia: Spinal | Site: Knee | Laterality: Right

## 2022-12-23 ENCOUNTER — Encounter: Payer: Self-pay | Admitting: Pharmacist Clinician (PhC)/ Clinical Pharmacy Specialist

## 2022-12-23 ENCOUNTER — Ambulatory Visit
Payer: Medicare Other | Attending: Cardiovascular Disease | Admitting: Pharmacist Clinician (PhC)/ Clinical Pharmacy Specialist

## 2022-12-23 VITALS — BP 149/88 | HR 62

## 2022-12-23 DIAGNOSIS — E7849 Other hyperlipidemia: Secondary | ICD-10-CM

## 2022-12-23 DIAGNOSIS — I2510 Atherosclerotic heart disease of native coronary artery without angina pectoris: Secondary | ICD-10-CM | POA: Diagnosis not present

## 2022-12-23 DIAGNOSIS — I10 Essential (primary) hypertension: Secondary | ICD-10-CM

## 2022-12-23 DIAGNOSIS — M1711 Unilateral primary osteoarthritis, right knee: Secondary | ICD-10-CM | POA: Diagnosis not present

## 2022-12-23 MED ORDER — CHLORTHALIDONE 25 MG PO TABS: 12.5000 mg | ORAL_TABLET | Freq: Every day | ORAL | 3 refills | Status: DC

## 2022-12-23 NOTE — Progress Notes (Signed)
Office Visit    Patient Name: [name] Date of Encounter: 12/23/2022  Primary Care Provider:  Carollee Herter, Alferd Apa, DO Primary Cardiologist:  Candee Furbish MD  Chief Complaint    Hypertension  Significant Past Medical History   obesity H/o gastric bypass 2008  CAD Recent CAC = 94 - 86 the percentil    Allergies  Allergen Reactions   Aspirin     REACTION: intolerance-gi upset   Codeine Itching   Morphine Itching   Norvasc [Amlodipine]    Sulfa Antibiotics Other (See Comments)    Unsure of reaction   Sulfonamide Derivatives     Unsure of reaction    History of Present Illness    [name] is a 67 y.o. female patient of Dr Marlou Porch, in the office today for hypertension follow up.   She was seen by Cammy Copa about a month ago, at which time her BP was noted to be 155/91.  Dr. Posey Pronto switched her lisinopril to losartan, however this caused her have a heaviness in her chest.  She restarted the lisinopril, and it was increased to 10 mg daily.  She was asked to continue with home monitoring and bring that information to her follow up.   At gastric bypass lost 90 lbs, now back up to 186  (was 238 at start).  She is interested in [place], but until it will be covered by Medicare, it is not affordable.    Currently she is in need of a knee replacement. 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    Blood Pressure Goal:  130/80  Current Medications:  lisinopril 10 mg qam  Previously tried:  losartan - chest discomfort, amlodipine - chest discomfort; carvedilol - doesn't agree with her  Family Hx: both parents with hypertension, neither with heart disease, dad and sister diabetic (sister w/o htn)  71 and 69, no children  Social Hx:      Tobacco: none  Alcohol: occasional   Caffeine: 1 coffee in am and rest of the day - water, zero Gatorade  Diet:     eats 5 smaller meals daily 2/2 gastric bypass, now working with   -high protein diet -chicken, fish -had gastric by pass 2006 gained 30 -40 lbs 3  Terrible snacker - crackers, cheese, not much sugar  Fairlife chocolate protein and egg or yogurt with nuts for breakfast  Has been eating out more in the past couple of weeks, running back and forth to care for parents     Exercise: due for right knee replacement so does not do any exercise also taking care of elderly parents which consume 90% of time   Home BP readings:  home Omron cuff, fairly new   tested her cuff in the office today and it read within 5 points systolic; 10 points diastolic 25 readings at home average 132/88  (range 118-145/75-95)  Accessory Clinical Findings    Lab Results  Component Value Date   CREATININE 0.54 10/01/2022   BUN 18 10/01/2022   NA 140 10/01/2022   K 4.4 10/01/2022   CL 103 10/01/2022   CO2 27 10/01/2022   Lab Results  Component Value Date   ALT 13 10/01/2022   AST 21 10/01/2022   ALKPHOS 69 10/01/2022   BILITOT 0.6 10/01/2022   Lab Results  Component Value Date  HGBA1C 5.5 04/01/2022    Home Medications    Current Outpatient Medications  Medication Sig Dispense Refill   chlorthalidone (HYGROTON) 25 MG tablet Take 0.5 tablets (12.5 mg total) by mouth daily. 15 tablet 3   amoxicillin-clavulanate (AUGMENTIN) 875-125 MG tablet Take 1 tablet by mouth every 12 (twelve) hours. 14 tablet 0   Biotin w/ Vitamins C & E (HAIR SKIN & NAILS GUMMIES PO) Take 1 each by mouth daily.     Calcium Carb-Cholecalciferol (CALTRATE 600+D3 SOFT PO) Take by mouth.     esomeprazole (NEXIUM) 40 MG capsule Take 1 capsule (40 mg total) by mouth daily as needed. Crack open capsule and ingest contents. Please keep your January appointment for further refills. Thank you 30 capsule 1   fluticasone (FLONASE) 50 MCG/ACT nasal spray Use 2 spray(s) in each nostril once daily 16 g 0   ibuprofen (ADVIL) 400 MG tablet Take 400  mg by mouth 2 (two) times daily.     lisinopril (ZESTRIL) 10 MG tablet Take 1 tablet (10 mg total) by mouth daily. 90 tablet 3   rosuvastatin (CRESTOR) 10 MG tablet Take 1 tablet (10 mg total) by mouth daily. 90 tablet 3   vitamin B-12 (CYANOCOBALAMIN) 1000 MCG tablet Take 1,000 mcg by mouth daily.     No current facility-administered medications for this visit.     HYPERTENSION CONTROL Vitals:   12/23/22 1040 12/23/22 1045  BP: (!) 153/93 (!) 149/88    The patient's blood pressure is elevated above target today.  In order to address the patient's elevated BP: Blood pressure will be monitored at home to determine if medication changes need to be made.; A new medication was prescribed today.      Assessment & Plan    Essential hypertension Assessment: BP is uncontrolled in office BP 153/93 mmHg;  above the goal (<130/80). Home readings much better, although still diastolic elevation - average 132/88 Tolerates lisinopril well without any side effects - hesitant to increase dose  Denies SOB, palpitation, chest pain, headaches,or swelling Reiterated the importance of regular exercise and low salt diet   Plan:  Start taking chlorthalidone 12.5 mg once daily in the mornings Continue taking lisinopril 10 mg once daily in the mornings Patient to keep record of BP readings with heart rate and report to [place] at the next visit Patient to follow up with PharmD in 3 months - having knee replacement late April/early May Labs ordered today:  BMET - 2 weeks   ASCVD (arteriosclerotic cardiovascular disease) Assessment: Patient with newly diagnosed ASCVD - Calcium score at 94  (86 th percentile) LDL at 59 on no medication (09/2022) Patient hesitant to take rosuvastatin - sites multiple medication sensitivities  Plan: Patient would like to check Lp(a) to further assess risk  Depending on results, might be willing to start medication   Tommy Medal PharmD CPP [facility]   [address] [place], [place] [address] [phone]

## 2022-12-23 NOTE — Patient Instructions (Addendum)
Follow up appointment: Monday June 3 at 10:30 am  Go to the lab in 2 WEEKS TO CHECK KIDNEY FUNCTION (AROUND APRIL 8)  Take your BP meds as follows: START CHLORTHALIDONE 12.5 MG (1/2 OF 25 MG TABLET) ONCE DAILY IN THE MORNINGS.  CONTINUE WITH LISINOPRIL 10 MG EACH MORNING.    Check your blood pressure at home daily (if able) and keep record of the readings.  Look for foods that will decrease inflammation and avoid those that can cause it.  (You can google this!)  Hypertension "High blood pressure"  Hypertension is often called "The Silent Killer." It rarely causes symptoms until it is extremely  high or has done damage to other organs in the body. For this reason, you should have your  blood pressure checked regularly by your physician. We will check your blood pressure  every time you see a provider at one of our offices.   Your blood pressure reading consists of two numbers. Ideally, blood pressure should be  below 120/80. The first ("top") number is called the systolic pressure. It measures the  pressure in your arteries as your heart beats. The second ("bottom") number is called the diastolic pressure. It measures the pressure in your arteries as the heart relaxes between beats.  The benefits of getting your blood pressure under control are enormous. A 10-point  reduction in systolic blood pressure can reduce your risk of stroke by 27% and heart failure by 28%  Your blood pressure goal is < 130/80  To check your pressure at home you will need to:  1. Sit up in a chair, with feet flat on the floor and back supported. Do not cross your ankles or legs. 2. Rest your left arm so that the cuff is about heart level. If the cuff goes on your upper arm,  then just relax the arm on the table, arm of the chair or your lap. If you have a wrist cuff, we  suggest relaxing your wrist against your chest (think of it as Pledging the Flag with the  wrong arm).  3. Place the cuff snugly around  your arm, about 1 inch above the crook of your elbow. The  cords should be inside the groove of your elbow.  4. Sit quietly, with the cuff in place, for about 5 minutes. After that 5 minutes press the power  button to start a reading. 5. Do not talk or move while the reading is taking place.  6. Record your readings on a sheet of paper. Although most cuffs have a memory, it is often  easier to see a pattern developing when the numbers are all in front of you.  7. You can repeat the reading after 1-3 minutes if it is recommended  Make sure your bladder is empty and you have not had caffeine or tobacco within the last 30 min  Always bring your blood pressure log with you to your appointments. If you have not brought your monitor in to be double checked for accuracy, please bring it to your next appointment.  You can find a list of quality blood pressure cuffs at validatebp.org

## 2022-12-23 NOTE — Assessment & Plan Note (Signed)
Assessment: Patient with newly diagnosed ASCVD - Calcium score at 94  (86 th percentile) LDL at 59 on no medication (09/2022) Patient hesitant to take rosuvastatin - sites multiple medication sensitivities  Plan: Patient would like to check Lp(a) to further assess risk  Depending on results, might be willing to start medication

## 2022-12-23 NOTE — Assessment & Plan Note (Addendum)
Assessment: BP is uncontrolled in office BP 153/93 mmHg;  above the goal (<130/80). Home readings much better, although still diastolic elevation - average 132/88 Tolerates lisinopril well without any side effects - hesitant to increase dose  Denies SOB, palpitation, chest pain, headaches,or swelling Reiterated the importance of regular exercise and low salt diet   Plan:  Start taking chlorthalidone 12.5 mg once daily in the mornings Continue taking lisinopril 10 mg once daily in the mornings Patient to keep record of BP readings with heart rate and report to [place] at the next visit Patient to follow up with PharmD in 3 months - having knee replacement late April/early May Labs ordered today:  BMET - 2 weeks

## 2022-12-30 ENCOUNTER — Ambulatory Visit (INDEPENDENT_AMBULATORY_CARE_PROVIDER_SITE_OTHER): Payer: Medicare Other | Admitting: Family Medicine

## 2022-12-30 ENCOUNTER — Encounter: Payer: Self-pay | Admitting: Family Medicine

## 2022-12-30 VITALS — BP 138/88 | HR 73 | Temp 98.2°F | Resp 18 | Ht 62.0 in | Wt 186.6 lb

## 2022-12-30 DIAGNOSIS — I2510 Atherosclerotic heart disease of native coronary artery without angina pectoris: Secondary | ICD-10-CM

## 2022-12-30 DIAGNOSIS — M1711 Unilateral primary osteoarthritis, right knee: Secondary | ICD-10-CM | POA: Diagnosis not present

## 2022-12-30 DIAGNOSIS — I10 Essential (primary) hypertension: Secondary | ICD-10-CM | POA: Diagnosis not present

## 2022-12-30 DIAGNOSIS — M450 Ankylosing spondylitis of multiple sites in spine: Secondary | ICD-10-CM | POA: Diagnosis not present

## 2022-12-30 DIAGNOSIS — E785 Hyperlipidemia, unspecified: Secondary | ICD-10-CM

## 2022-12-30 LAB — CBC WITH DIFFERENTIAL/PLATELET
Basophils Absolute: 0 10*3/uL (ref 0.0–0.1)
Basophils Relative: 1 % (ref 0.0–3.0)
Eosinophils Absolute: 0.1 10*3/uL (ref 0.0–0.7)
Eosinophils Relative: 1.7 % (ref 0.0–5.0)
HCT: 41.5 % (ref 36.0–46.0)
Hemoglobin: 14.1 g/dL (ref 12.0–15.0)
Lymphocytes Relative: 44.3 % (ref 12.0–46.0)
Lymphs Abs: 1.7 10*3/uL (ref 0.7–4.0)
MCHC: 33.8 g/dL (ref 30.0–36.0)
MCV: 95.9 fl (ref 78.0–100.0)
Monocytes Absolute: 0.4 10*3/uL (ref 0.1–1.0)
Monocytes Relative: 10.3 % (ref 3.0–12.0)
Neutro Abs: 1.7 10*3/uL (ref 1.4–7.7)
Neutrophils Relative %: 42.7 % — ABNORMAL LOW (ref 43.0–77.0)
Platelets: 344 10*3/uL (ref 150.0–400.0)
RBC: 4.33 Mil/uL (ref 3.87–5.11)
RDW: 12.9 % (ref 11.5–15.5)
WBC: 3.9 10*3/uL — ABNORMAL LOW (ref 4.0–10.5)

## 2022-12-30 LAB — TSH: TSH: 1.65 u[IU]/mL (ref 0.35–5.50)

## 2022-12-30 LAB — LIPID PANEL
Cholesterol: 155 mg/dL (ref 0–200)
HDL: 79.6 mg/dL (ref >39.00)
LDL Cholesterol: 62 mg/dL (ref 0–99)
NonHDL: 75.1
Total CHOL/HDL Ratio: 2
Triglycerides: 68 mg/dL (ref 0.0–149.0)
VLDL: 13.6 mg/dL (ref 0.0–40.0)

## 2022-12-30 LAB — COMPREHENSIVE METABOLIC PANEL
ALT: 16 U/L (ref 0–35)
AST: 21 U/L (ref 0–37)
Albumin: 4.2 g/dL (ref 3.5–5.2)
Alkaline Phosphatase: 85 U/L (ref 39–117)
BUN: 17 mg/dL (ref 6–23)
CO2: 26 mEq/L (ref 19–32)
Calcium: 9.7 mg/dL (ref 8.4–10.5)
Chloride: 102 mEq/L (ref 96–112)
Creatinine, Ser: 0.61 mg/dL (ref 0.40–1.20)
GFR: 94.57 mL/min (ref >60.00)
Glucose, Bld: 96 mg/dL (ref 70–99)
Potassium: 4.3 mEq/L (ref 3.5–5.1)
Sodium: 135 mEq/L (ref 135–145)
Total Bilirubin: 0.5 mg/dL (ref 0.2–1.2)
Total Protein: 7.1 g/dL (ref 6.0–8.3)

## 2022-12-30 NOTE — Assessment & Plan Note (Signed)
Check labs 

## 2022-12-30 NOTE — Assessment & Plan Note (Signed)
stable °

## 2022-12-30 NOTE — Assessment & Plan Note (Signed)
Cleared for R total knee replacement  Cleared by cardiology as well

## 2022-12-30 NOTE — Assessment & Plan Note (Signed)
Well controlled, no changes to meds. Encouraged heart healthy diet such as the DASH diet and exercise as tolerated.  °

## 2022-12-30 NOTE — Progress Notes (Signed)
Subjective:   By signing my name below, I, Daiva Huge, attest that this documentation has been prepared under the direction and in the presence of Ann Held, DO 12/30/22   Patient ID: [name], female    DOB: [birth year], 67 y.o.   MRN: [id number]  Chief Complaint  Patient presents with   Pre-op Exam    Right knee replacement    HPI Patient is in today for a pre-operative evaluation.  Pending right total knee arthroplasty with orthopedist, Dr. Paralee Cancel on 01/28/23.  She has a history of neck and spine ankylosing spondylitis, GERD, hiatal hernia, hypertension, lymphadenopathy, morbid obesity, obstructive sleep apnea, rheumatoid arthritis, thyroid nodule 2008, Vitamin D deficiency, breast reduction surgery 1996, total hip arthroplasty x 2 left with one revision, bariatric surgery 2009.  She is currently taking Hygroton, Nexium, Flonase, Zestril, and Cyanocobalamin. She has not been taking Crestor.  BP today normal today at 138/88. Reports blood pressures measured at home average closer to 125/88.  Reports she is having issues with finding the right words in conversation but would like to get through her knee surgery before addressing this.  Past Medical History:  Diagnosis Date   Abdominal pain, other specified site 03/31/2008   Centricity Description: FLANK PAIN, RIGHT Qualifier: Diagnosis of  By: Linna Darner MD, Gwyndolyn Saxon   Centricity Description: ABDOMINAL PAIN OTHER SPECIFIED SITE Qualifier: Diagnosis of  By: Jerold Coombe     Acute sprain or strain of cervical region 04/10/2012   ALLERGIC RHINITIS 06/26/2007   Qualifier: Diagnosis of  By: Marca Ancona RMA, Drake     ANKYLOSING SPONDYLITIS 03/31/2008   Qualifier: Diagnosis of  By: Linna Darner MD, Gwyndolyn Saxon     Ankylosing spondylitis    neck and spin   Ankylosing spondylitis of multiple sites in spine    Arthritis    Back pain    Bariatric surgery status 01/14/2008   Qualifier: Diagnosis of  By: Etter Sjogren DOKendrick Fries     Blood  in stool 12/02/2008   Qualifier: Diagnosis of  By: Jerold Coombe     Blood transfusion without reported diagnosis    Calf pain 10/13/2012   CHEST PAIN UNSPECIFIED 10/11/2009   Qualifier: Diagnosis of  By: Deatra Ina MD, Sandy Salaam    CONSTIPATION 04/06/2010   Qualifier: Diagnosis of  By: Jerold Coombe     CORNEAL ABRASION, LEFT 01/22/2010   Qualifier: Diagnosis of  By: Jerold Coombe     Gastritis    GERD 06/26/2007   Qualifier: Diagnosis of  By: Marca Ancona RMA, Lucy     GERD (gastroesophageal reflux disease)    Hiatal hernia    Hypertension    no medications now   IBS 06/26/2007   Qualifier: Diagnosis of  By: Marca Ancona RMA, Lucy     Joint pain    Lactose intolerance    LEG CRAMPS, NOCTURNAL 11/14/2010   Qualifier: Diagnosis of  By: Heath Lark     Long-term current use of steroids 04/20/2013   LYMPHADENOPATHY 06/10/2007   Qualifier: Diagnosis of  By: Jerold Coombe     MORBID OBESITY 06/26/2007   Qualifier: Diagnosis of  By: Larose Kells     MUSCLE PAIN 03/31/2008   Qualifier: Diagnosis of  By: Linna Darner MD, William     Noninfectious gastroenteritis and colitis 03/17/2013   Obesity (BMI 30-39.9) 03/16/2014   OSA (obstructive sleep apnea) 03/08/2016   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/23/2010   Qualifier: Diagnosis of  By: Jerold Coombe     Rheumatoid arthritis    Seasonal allergies 05/14/2015   THYROID CYST 06/26/2007   Qualifier: Diagnosis of  By: Marca Ancona RMA, Lucy     THYROID NODULE, HX OF 06/10/2007   Qualifier: Diagnosis of  By: Jerold Coombe     Vitamin D deficiency     Past Surgical History:  Procedure Laterality Date   BARIATRIC SURGERY     BREAST REDUCTION SURGERY  1996   COLONOSCOPY  10/24/2009   kaplan-normal exam   ESOPHAGOGASTRODUODENOSCOPY     pelvis replacement  2010   TOTAL HIP ARTHROPLASTY     x 2 left, one revision   UPPER GASTROINTESTINAL ENDOSCOPY      Family History  Problem Relation Age of Onset   Hypertension Mother    High Cholesterol  Mother    Diabetes Mother    Bladder Cancer Mother    Arthritis Father    Hypertension Father    Diabetes Father    High Cholesterol Father    Hypertension Sister    Diabetes Sister    Melanoma Maternal Grandmother    Breast cancer Maternal Grandmother        and Aunt   Colon cancer Neg Hx    Esophageal cancer Neg Hx    Rectal cancer Neg Hx    Stomach cancer Neg Hx    Colon polyps Neg Hx     Social History   Socioeconomic History   Marital status: Married    Spouse name: Trixy Everson   Number of children: Not on file   Years of education: Not on file   Highest education level: Not on file  Occupational History   Occupation: retired  Tobacco Use   Smoking status: Never   Smokeless tobacco: Never  Vaping Use   Vaping Use: Never used  Substance and Sexual Activity   Alcohol use: No    Alcohol/week: 0.0 standard drinks of alcohol   Drug use: No   Sexual activity: Yes  Other Topics Concern   Not on file  Social History Narrative   Occupation: Production manager   Daily Caffeine use- 1 cup daily      Social Determinants of Health   Financial Resource Strain: Low Risk  (05/01/2021)   Overall Financial Resource Strain (CARDIA)    Difficulty of Paying Living Expenses: Not hard at all  Food Insecurity: No Food Insecurity (06/17/2022)   Hunger Vital Sign    Worried About Running Out of Food in the Last Year: Never true    Ran Out of Food in the Last Year: Never true  Transportation Needs: No Transportation Needs (06/17/2022)   PRAPARE - Hydrologist (Medical): No    Lack of Transportation (Non-Medical): No  Physical Activity: Inactive (06/17/2022)   Exercise Vital Sign    Days of Exercise per Week: 0 days    Minutes of Exercise per Session: 0 min  Stress: No Stress Concern Present (06/17/2022)   Lerna    Feeling of Stress : Not at all  Social Connections: Moderately  Integrated (06/17/2022)   Social Connection and Isolation Panel [NHANES]    Frequency of Communication with Friends and Family: More than three times a week    Frequency of Social Gatherings with Friends and Family: More than three times a week    Attends Religious Services: More than 4 times per year    Active Member  of Clubs or Organizations: No    Attends Archivist Meetings: Never    Marital Status: Married  Human resources officer Violence: Not At Risk (06/17/2022)   Humiliation, Afraid, Rape, and Kick questionnaire    Fear of Current or Ex-Partner: No    Emotionally Abused: No    Physically Abused: No    Sexually Abused: No    Outpatient Medications Prior to Visit  Medication Sig Dispense Refill   amoxicillin-clavulanate (AUGMENTIN) 875-125 MG tablet Take 1 tablet by mouth every 12 (twelve) hours. 14 tablet 0   Biotin w/ Vitamins C & E (HAIR SKIN & NAILS GUMMIES PO) Take 1 each by mouth daily.     Calcium Carb-Cholecalciferol (CALTRATE 600+D3 SOFT PO) Take by mouth.     chlorthalidone (HYGROTON) 25 MG tablet Take 0.5 tablets (12.5 mg total) by mouth daily. 15 tablet 3   esomeprazole (NEXIUM) 40 MG capsule Take 1 capsule (40 mg total) by mouth daily as needed. Crack open capsule and ingest contents. Please keep your January appointment for further refills. Thank you 30 capsule 1   fluticasone (FLONASE) 50 MCG/ACT nasal spray Use 2 spray(s) in each nostril once daily 16 g 0   ibuprofen (ADVIL) 400 MG tablet Take 400 mg by mouth 2 (two) times daily.     lisinopril (ZESTRIL) 10 MG tablet Take 1 tablet (10 mg total) by mouth daily. 90 tablet 3   rosuvastatin (CRESTOR) 10 MG tablet Take 1 tablet (10 mg total) by mouth daily. 90 tablet 3   vitamin B-12 (CYANOCOBALAMIN) 1000 MCG tablet Take 1,000 mcg by mouth daily.     No facility-administered medications prior to visit.    Allergies  Allergen Reactions   Aspirin     REACTION: intolerance-gi upset   Codeine Itching   Morphine  Itching   Norvasc [Amlodipine]    Sulfa Antibiotics Other (See Comments)    Unsure of reaction   Sulfonamide Derivatives     Unsure of reaction    Review of Systems  Constitutional:  Negative for fever and malaise/fatigue.  HENT:  Negative for congestion.   Eyes:  Negative for blurred vision.  Respiratory:  Negative for cough and shortness of breath.   Cardiovascular:  Negative for chest pain, palpitations and leg swelling.  Gastrointestinal:  Negative for vomiting.  Musculoskeletal:  Negative for back pain.  Skin:  Negative for rash.  Neurological:  Negative for loss of consciousness and headaches.       Objective:    Physical Exam Constitutional:      General: She is not in acute distress.    Appearance: Normal appearance. She is not ill-appearing.  HENT:     Head: Normocephalic and atraumatic.     Right Ear: External ear normal.     Left Ear: External ear normal.  Eyes:     Extraocular Movements: Extraocular movements intact.     Pupils: Pupils are equal, round, and reactive to light.  Cardiovascular:     Rate and Rhythm: Normal rate and regular rhythm.     Heart sounds: Normal heart sounds. No murmur heard.    No gallop.  Pulmonary:     Effort: Pulmonary effort is normal. No respiratory distress.     Breath sounds: Normal breath sounds. No wheezing or rales.  Skin:    General: Skin is warm and dry.  Neurological:     Mental Status: She is alert and oriented to person, place, and time.  Psychiatric:  Judgment: Judgment normal.     BP 138/88 (BP Location: Right Arm, Patient Position: Sitting, Cuff Size: Normal)   Pulse 73   Temp 98.2 F (36.8 C) (Oral)   Resp 18   Ht 5\' 2"  (1.575 m)   Wt 186 lb 9.6 oz (84.6 kg)   SpO2 100%   BMI 34.13 kg/m  Wt Readings from Last 3 Encounters:  12/30/22 186 lb 9.6 oz (84.6 kg)  11/18/22 192 lb (87.1 kg)  10/01/22 189 lb 9.6 oz (86 kg)       Assessment & Plan:  Primary osteoarthritis of right knee Assessment  & Plan: Cleared for R total knee replacement  Cleared by cardiology as well    Essential hypertension Assessment & Plan: Well controlled, no changes to meds. Encouraged heart healthy diet such as the DASH diet and exercise as tolerated.    Orders: -     CBC with Differential/Platelet -     Comprehensive metabolic panel -     Lipid panel -     TSH -     Lipoprotein A (LPA)  Hyperlipidemia, unspecified hyperlipidemia type  ASCVD (arteriosclerotic cardiovascular disease) Assessment & Plan: Check labs    Ankylosing spondylitis of multiple sites in spine Assessment & Plan: stable      I,Alexander Ruley,acting as a scribe for Ann Held, DO.,have documented all relevant documentation on the behalf of Ann Held, DO,as directed by  Ann Held, DO while in the presence of Meeker, DO, personally preformed the services described in this documentation.  All medical record entries made by the scribe were at my direction and in my presence.  I have reviewed the chart and discharge instructions (if applicable) and agree that the record reflects my personal performance and is accurate and complete. 12/30/22   Ann Held, DO

## 2023-01-01 ENCOUNTER — Telehealth: Payer: Self-pay

## 2023-01-01 NOTE — Telephone Encounter (Signed)
Pre-op form, OV, and labs faxed.

## 2023-01-02 ENCOUNTER — Ambulatory Visit: Payer: Medicare Other | Admitting: Rheumatology

## 2023-01-02 ENCOUNTER — Other Ambulatory Visit: Payer: Self-pay | Admitting: Family Medicine

## 2023-01-02 DIAGNOSIS — E785 Hyperlipidemia, unspecified: Secondary | ICD-10-CM

## 2023-01-02 LAB — LIPOPROTEIN A (LPA): Lipoprotein (a): 104 nmol/L — ABNORMAL HIGH (ref <75)

## 2023-01-02 MED ORDER — ROSUVASTATIN CALCIUM 10 MG PO TABS: 10.0000 mg | ORAL_TABLET | Freq: Every day | ORAL | 3 refills | Status: DC

## 2023-01-13 DIAGNOSIS — G4733 Obstructive sleep apnea (adult) (pediatric): Secondary | ICD-10-CM | POA: Diagnosis not present

## 2023-01-13 NOTE — Patient Instructions (Addendum)
SURGICAL WAITING ROOM VISITATION  Patients having surgery or a procedure may have no more than 2 support people in the waiting area - these visitors may rotate.    Children under the age of 24 must have an adult with them who is not the patient.  Due to an increase in RSV and influenza rates and associated hospitalizations, children ages 36 and under may not visit patients in [facility] hospitals.  If the patient needs to stay at the hospital during part of their recovery, the visitor guidelines for inpatient rooms apply. Pre-op nurse will coordinate an appropriate time for 1 support person to accompany patient in pre-op.  This support person may not rotate.    Please refer to the [facility] website for the visitor guidelines for Inpatients (after your surgery is over and you are in a regular room).       Your procedure is scheduled on: 01-28-23   Report to [facility] Main Entrance    Report to admitting at 5:15 AM   Call this number if you have problems the morning of surgery [phone]   Do not eat food :After Midnight.   After Midnight you may have the following liquids until 4:15 AM DAY OF SURGERY  Water Non-Citrus Juices (without pulp, NO RED-Apple, White grape, White cranberry) Black Coffee (NO MILK/CREAM OR CREAMERS, sugar ok)  Clear Tea (NO MILK/CREAM OR CREAMERS, sugar ok) regular and decaf                             Plain Jell-O (NO RED)                                           Fruit ices (not with fruit pulp, NO RED)                                     Popsicles (NO RED)                                                               Sports drinks like Gatorade (NO RED)                  The day of surgery:  Drink ONE (1) Pre-Surgery Clear Ensure at 4:15 AM the morning of surgery. Drink in one sitting. Do not sip.  This drink was given to you during your hospital  pre-op appointment visit. Nothing else to drink after completing the Pre-Surgery Clear  Ensure.          If you have questions, please contact your surgeon's office.   FOLLOW  ANY ADDITIONAL PRE OP INSTRUCTIONS YOU RECEIVED FROM YOUR SURGEON'S OFFICE!!!     Oral Hygiene is also important to reduce your risk of infection.                                    Remember - BRUSH YOUR TEETH THE MORNING OF SURGERY WITH YOUR REGULAR TOOTHPASTE  DENTURES  WILL BE REMOVED PRIOR TO SURGERY PLEASE DO NOT APPLY "Poly grip" OR ADHESIVES!!!   Do NOT smoke after Midnight   Take these medicines the morning of surgery with A SIP OF WATER:   Nexium  Bring CPAP mask and tubing day of surgery.                              You may not have any metal on your body including hair pins, jewelry, and body piercing             Do not wear make-up, lotions, powders, perfumes or deodorant  Do not wear nail polish including gel and S&S, artificial/acrylic nails, or any other type of covering on natural nails including finger and toenails. If you have artificial nails, gel coating, etc. that needs to be removed by a nail salon please have this removed prior to surgery or surgery may need to be canceled/ delayed if the surgeon/ anesthesia feels like they are unable to be safely monitored.   Do not shave  48 hours prior to surgery.    Do not bring valuables to the hospital. [facility] IS NOT RESPONSIBLE   FOR VALUABLES.   Contacts, glasses, dentures or bridgework may not be worn into surgery.   Bring small overnight bag day of surgery.   DO NOT BRING YOUR HOME MEDICATIONS TO THE HOSPITAL. PHARMACY WILL DISPENSE MEDICATIONS LISTED ON YOUR MEDICATION LIST TO YOU DURING YOUR ADMISSION IN THE HOSPITAL!               Please read over the following fact sheets you were given: IF YOU HAVE QUESTIONS ABOUT YOUR PRE-OP INSTRUCTIONS PLEASE CALL [phone] Gwen   If you received a COVID test during your pre-op visit  it is requested that you wear a mask when out in public, stay away from anyone that may not  be feeling well and notify your surgeon if you develop symptoms. If you test positive for Covid or have been in contact with anyone that has tested positive in the last 10 days please notify you surgeon.      Incentive Spirometer  An incentive spirometer is a tool that can help keep your lungs clear and active. This tool measures how well you are filling your lungs with each breath. Taking long deep breaths may help reverse or decrease the chance of developing breathing (pulmonary) problems (especially infection) following: A long period of time when you are unable to move or be active. BEFORE THE PROCEDURE  If the spirometer includes an indicator to show your best effort, your nurse or respiratory therapist will set it to a desired goal. If possible, sit up straight or lean slightly forward. Try not to slouch. Hold the incentive spirometer in an upright position. INSTRUCTIONS FOR USE  Sit on the edge of your bed if possible, or sit up as far as you can in bed or on a chair. Hold the incentive spirometer in an upright position. Breathe out normally. Place the mouthpiece in your mouth and seal your lips tightly around it. Breathe in slowly and as deeply as possible, raising the piston or the ball toward the top of the column. Hold your breath for 3-5 seconds or for as long as possible. Allow the piston or ball to fall to the bottom of the column. Remove the mouthpiece from your mouth and breathe out normally. Rest for a few seconds and repeat Steps  1 through 7 at least 10 times every 1-2 hours when you are awake. Take your time and take a few normal breaths between deep breaths. The spirometer may include an indicator to show your best effort. Use the indicator as a goal to work toward during each repetition. After each set of 10 deep breaths, practice coughing to be sure your lungs are clear. If you have an incision (the cut made at the time of surgery), support your incision when coughing by  placing a pillow or rolled up towels firmly against it. Once you are able to get out of bed, walk around indoors and cough well. You may stop using the incentive spirometer when instructed by your caregiver.  RISKS AND COMPLICATIONS Take your time so you do not get dizzy or light-headed. If you are in pain, you may need to take or ask for pain medication before doing incentive spirometry. It is harder to take a deep breath if you are having pain. AFTER USE Rest and breathe slowly and easily. It can be helpful to keep track of a log of your progress. Your caregiver can provide you with a simple table to help with this. If you are using the spirometer at home, follow these instructions: SEEK MEDICAL CARE IF:  You are having difficultly using the spirometer. You have trouble using the spirometer as often as instructed. Your pain medication is not giving enough relief while using the spirometer. You develop fever of 100.5 F (38.1 C) or higher. SEEK IMMEDIATE MEDICAL CARE IF:  You cough up bloody sputum that had not been present before. You develop fever of 102 F (38.9 C) or greater. You develop worsening pain at or near the incision site. MAKE SURE YOU:  Understand these instructions. Will watch your condition. Will get help right away if you are not doing well or get worse. Document Released: 01/27/2007 Document Revised: 12/09/2011 Document Reviewed: 03/30/2007 [facility] Patient Information 2014 [place], [place].   ________________________________________________________________________

## 2023-01-13 NOTE — Progress Notes (Addendum)
COVID Vaccine Completed:  Yes  Date of COVID positive in last 90 days:  PCP - Seabron Spates, DO Cardiologist - Donato Schultz, MD Pulmonologist - Fannie Knee, MD  Chest x-ray -  EKG - 11-07-22 Epic Stress Test - 10-07-17 Epic ECHO -  Cardiac Cath -  Pacemaker/ICD device last checked: Spinal Cord Stimulator: Coronary CT - 10-04-22 Epic  Bowel Prep -   Sleep Study - Yes, +sleep apnea CPAP -   Fasting Blood Sugar -  Checks Blood Sugar _____ times a day  Last dose of GLP1 agonist-  N/A GLP1 instructions:  N/A   Last dose of SGLT-2 inhibitors-  N/A SGLT-2 instructions: N/A   Blood Thinner Instructions: Aspirin Instructions: Last Dose:  Activity level:  Can go up a flight of stairs and perform activities of daily living without stopping and without symptoms of chest pain or shortness of breath.  Able to exercise without symptoms  Unable to go up a flight of stairs without symptoms of     Anesthesia review: ASCVD with calcium score of 94,  and HTN, followed by cardiology.  Chest pain evaluated by cardiology  Patient denies shortness of breath, fever, cough and chest pain at PAT appointment  Patient verbalized understanding of instructions that were given to them at the PAT appointment. Patient was also instructed that they will need to review over the PAT instructions again at home before surgery.

## 2023-01-15 ENCOUNTER — Encounter (HOSPITAL_COMMUNITY)
Admission: RE | Admit: 2023-01-15 | Discharge: 2023-01-15 | Disposition: A | Discharge status: Home / Self Care | Payer: Medicare Other | Source: Ambulatory Visit | Attending: Orthopedic Surgery | Admitting: Orthopedic Surgery

## 2023-01-15 ENCOUNTER — Encounter (HOSPITAL_COMMUNITY): Payer: Self-pay

## 2023-01-15 ENCOUNTER — Other Ambulatory Visit: Payer: Self-pay

## 2023-01-15 VITALS — BP 143/85 | HR 66 | Temp 98.4°F | Resp 20 | Ht 62.0 in | Wt 185.2 lb

## 2023-01-15 DIAGNOSIS — Z01812 Encounter for preprocedural laboratory examination: Secondary | ICD-10-CM | POA: Diagnosis not present

## 2023-01-15 DIAGNOSIS — Z01818 Encounter for other preprocedural examination: Secondary | ICD-10-CM

## 2023-01-15 LAB — SURGICAL PCR SCREEN
MRSA, PCR: NEGATIVE
Staphylococcus aureus: NEGATIVE

## 2023-01-27 ENCOUNTER — Encounter (HOSPITAL_COMMUNITY): Payer: Self-pay | Admitting: Orthopedic Surgery

## 2023-01-27 NOTE — Anesthesia Preprocedure Evaluation (Addendum)
Anesthesia Evaluation  Patient identified by MRN, date of birth, ID band Patient awake    Reviewed: Allergy & Precautions, NPO status , Patient's Chart, lab work & pertinent test results  Airway Mallampati: III  TM Distance: >3 FB Neck ROM: Limited    Dental  (+) Teeth Intact, Dental Advisory Given   Pulmonary sleep apnea    breath sounds clear to auscultation       Cardiovascular hypertension, Pt. on medications  Rhythm:Regular Rate:Normal     Neuro/Psych  PSYCHIATRIC DISORDERS  Depression     Neuromuscular disease    GI/Hepatic Neg liver ROS, hiatal hernia,GERD  Medicated,,  Endo/Other  negative endocrine ROS    Renal/GU negative Renal ROS     Musculoskeletal  (+) Arthritis , Rheumatoid disorders,    Abdominal   Peds  Hematology negative hematology ROS (+)   Anesthesia Other Findings - Anklylosing Spondylitis  Reproductive/Obstetrics                             Anesthesia Physical Anesthesia Plan  ASA: 2  Anesthesia Plan: Spinal   Post-op Pain Management: Regional block*   Induction: Intravenous  PONV Risk Score and Plan: 4 or greater and Ondansetron, Dexamethasone, Midazolam and Scopolamine patch - Pre-op  Airway Management Planned: Natural Airway  Additional Equipment: None  Intra-op Plan:   Post-operative Plan:   Informed Consent: I have reviewed the patients History and Physical, chart, labs and discussed the procedure including the risks, benefits and alternatives for the proposed anesthesia with the patient or authorized representative who has indicated his/her understanding and acceptance.     Dental advisory given  Plan Discussed with: CRNA  Anesthesia Plan Comments: (Lab Results      Component                Value               Date                      WBC                      3.9 (L)             12/30/2022                HGB                      14.1                 12/30/2022                HCT                      41.5                12/30/2022                MCV                      95.9                12/30/2022                PLT                      344.0  12/30/2022           )       Anesthesia Quick Evaluation

## 2023-01-28 ENCOUNTER — Ambulatory Visit (HOSPITAL_COMMUNITY): Payer: Medicare Other | Admitting: Physician Assistant

## 2023-01-28 ENCOUNTER — Encounter (HOSPITAL_COMMUNITY)
Admission: RE | Disposition: A | Discharge status: Home / Self Care | Payer: Self-pay | Source: Ambulatory Visit | Attending: Orthopedic Surgery

## 2023-01-28 ENCOUNTER — Other Ambulatory Visit: Payer: Self-pay

## 2023-01-28 ENCOUNTER — Observation Stay (HOSPITAL_COMMUNITY)
Admission: RE | Admit: 2023-01-28 | Discharge: 2023-01-31 | Disposition: A | Discharge status: Home / Self Care | Payer: Medicare Other | Source: Ambulatory Visit | Attending: Orthopedic Surgery | Admitting: Orthopedic Surgery

## 2023-01-28 ENCOUNTER — Ambulatory Visit (HOSPITAL_BASED_OUTPATIENT_CLINIC_OR_DEPARTMENT_OTHER): Payer: Medicare Other | Admitting: Anesthesiology

## 2023-01-28 ENCOUNTER — Encounter (HOSPITAL_COMMUNITY): Payer: Self-pay | Admitting: Orthopedic Surgery

## 2023-01-28 DIAGNOSIS — G4730 Sleep apnea, unspecified: Secondary | ICD-10-CM

## 2023-01-28 DIAGNOSIS — Z96651 Presence of right artificial knee joint: Secondary | ICD-10-CM

## 2023-01-28 DIAGNOSIS — M25461 Effusion, right knee: Secondary | ICD-10-CM

## 2023-01-28 DIAGNOSIS — Z96642 Presence of left artificial hip joint: Secondary | ICD-10-CM | POA: Diagnosis not present

## 2023-01-28 DIAGNOSIS — M25761 Osteophyte, right knee: Secondary | ICD-10-CM

## 2023-01-28 DIAGNOSIS — M1711 Unilateral primary osteoarthritis, right knee: Secondary | ICD-10-CM

## 2023-01-28 DIAGNOSIS — Z8616 Personal history of COVID-19: Secondary | ICD-10-CM | POA: Diagnosis not present

## 2023-01-28 DIAGNOSIS — M659 Synovitis and tenosynovitis, unspecified: Secondary | ICD-10-CM | POA: Diagnosis not present

## 2023-01-28 DIAGNOSIS — Z79899 Other long term (current) drug therapy: Secondary | ICD-10-CM | POA: Diagnosis not present

## 2023-01-28 DIAGNOSIS — I10 Essential (primary) hypertension: Secondary | ICD-10-CM | POA: Diagnosis not present

## 2023-01-28 DIAGNOSIS — Z7952 Long term (current) use of systemic steroids: Secondary | ICD-10-CM | POA: Insufficient documentation

## 2023-01-28 DIAGNOSIS — G8918 Other acute postprocedural pain: Secondary | ICD-10-CM | POA: Diagnosis not present

## 2023-01-28 HISTORY — PX: TOTAL KNEE ARTHROPLASTY: SHX125

## 2023-01-28 SURGERY — ARTHROPLASTY, KNEE, TOTAL
Anesthesia: Spinal | Site: Knee | Laterality: Right

## 2023-01-28 MED ORDER — AMISULPRIDE (ANTIEMETIC) 5 MG/2ML IV SOLN: 10.0000 mg | Freq: Once | INTRAVENOUS | Status: DC | PRN

## 2023-01-28 MED ORDER — TRANEXAMIC ACID-NACL 1000-0.7 MG/100ML-% IV SOLN
1000.0000 mg | Freq: Once | INTRAVENOUS | Status: AC
  Administered 2023-01-28: 1000 mg via INTRAVENOUS
  Filled 2023-01-28: qty 100

## 2023-01-28 MED ORDER — DEXAMETHASONE SODIUM PHOSPHATE 10 MG/ML IJ SOLN: 8.0000 mg | Freq: Once | INTRAMUSCULAR | Status: DC

## 2023-01-28 MED ORDER — DIPHENHYDRAMINE HCL 12.5 MG/5ML PO ELIX: 12.5000 mg | ORAL_SOLUTION | ORAL | Status: DC | PRN

## 2023-01-28 MED ORDER — METOCLOPRAMIDE HCL 5 MG PO TABS: 5.0000 mg | ORAL_TABLET | Freq: Three times a day (TID) | ORAL | Status: DC | PRN

## 2023-01-28 MED ORDER — ONDANSETRON HCL 4 MG/2ML IJ SOLN
4.0000 mg | Freq: Four times a day (QID) | INTRAMUSCULAR | Status: DC | PRN
  Administered 2023-01-28 – 2023-01-29 (×2): 4 mg via INTRAVENOUS
  Filled 2023-01-28 (×2): qty 2

## 2023-01-28 MED ORDER — CEFAZOLIN SODIUM-DEXTROSE 2-4 GM/100ML-% IV SOLN
2.0000 g | Freq: Four times a day (QID) | INTRAVENOUS | Status: AC
  Administered 2023-01-28 (×2): 2 g via INTRAVENOUS
  Filled 2023-01-28 (×2): qty 100

## 2023-01-28 MED ORDER — FENTANYL CITRATE (PF) 100 MCG/2ML IJ SOLN
INTRAMUSCULAR | Status: DC | PRN
  Administered 2023-01-28 (×2): 50 ug via INTRAVENOUS

## 2023-01-28 MED ORDER — LISINOPRIL 10 MG PO TABS
10.0000 mg | ORAL_TABLET | Freq: Every day | ORAL | Status: DC
  Administered 2023-01-30 – 2023-01-31 (×2): 10 mg via ORAL
  Filled 2023-01-28 (×3): qty 1

## 2023-01-28 MED ORDER — KETOROLAC TROMETHAMINE 30 MG/ML IJ SOLN
INTRAMUSCULAR | Status: DC | PRN
  Administered 2023-01-28: 30 mg via INTRAMUSCULAR

## 2023-01-28 MED ORDER — ACETAMINOPHEN 325 MG PO TABS: 325.0000 mg | ORAL_TABLET | Freq: Once | ORAL | Status: DC | PRN

## 2023-01-28 MED ORDER — LACTATED RINGERS IV SOLN: INTRAVENOUS | Status: DC

## 2023-01-28 MED ORDER — HYDROMORPHONE HCL 2 MG PO TABS
1.0000 mg | ORAL_TABLET | ORAL | Status: DC | PRN
  Administered 2023-01-28 (×2): 2 mg via ORAL
  Filled 2023-01-28 (×2): qty 1

## 2023-01-28 MED ORDER — TRANEXAMIC ACID-NACL 1000-0.7 MG/100ML-% IV SOLN
1000.0000 mg | INTRAVENOUS | Status: AC
  Administered 2023-01-28: 1000 mg via INTRAVENOUS
  Filled 2023-01-28: qty 100

## 2023-01-28 MED ORDER — PROMETHAZINE HCL 25 MG/ML IJ SOLN: 6.2500 mg | INTRAMUSCULAR | Status: DC | PRN

## 2023-01-28 MED ORDER — METOCLOPRAMIDE HCL 5 MG/ML IJ SOLN: 5.0000 mg | Freq: Three times a day (TID) | INTRAMUSCULAR | Status: DC | PRN

## 2023-01-28 MED ORDER — DEXAMETHASONE SODIUM PHOSPHATE 10 MG/ML IJ SOLN
INTRAMUSCULAR | Status: AC
  Filled 2023-01-28: qty 3

## 2023-01-28 MED ORDER — BUPIVACAINE-EPINEPHRINE (PF) 0.25% -1:200000 IJ SOLN
INTRAMUSCULAR | Status: DC | PRN
  Administered 2023-01-28: 30 mL

## 2023-01-28 MED ORDER — PHENOL 1.4 % MT LIQD: 1.0000 | OROMUCOSAL | Status: DC | PRN

## 2023-01-28 MED ORDER — HYDROMORPHONE HCL 2 MG PO TABS
ORAL_TABLET | ORAL | Status: AC
  Filled 2023-01-28: qty 1

## 2023-01-28 MED ORDER — DEXAMETHASONE SODIUM PHOSPHATE 10 MG/ML IJ SOLN
10.0000 mg | Freq: Once | INTRAMUSCULAR | Status: AC
  Administered 2023-01-29: 10 mg via INTRAVENOUS
  Filled 2023-01-28: qty 1

## 2023-01-28 MED ORDER — RIVAROXABAN 10 MG PO TABS
10.0000 mg | ORAL_TABLET | Freq: Every day | ORAL | Status: DC
  Administered 2023-01-29 – 2023-01-31 (×3): 10 mg via ORAL
  Filled 2023-01-28 (×3): qty 1

## 2023-01-28 MED ORDER — SODIUM CHLORIDE 0.9 % IR SOLN
Status: DC | PRN
  Administered 2023-01-28: 1000 mL

## 2023-01-28 MED ORDER — ONDANSETRON HCL 4 MG/2ML IJ SOLN
INTRAMUSCULAR | Status: AC
  Filled 2023-01-28: qty 6

## 2023-01-28 MED ORDER — SODIUM CHLORIDE 0.9 % IV SOLN: INTRAVENOUS | Status: DC

## 2023-01-28 MED ORDER — ACETAMINOPHEN 500 MG PO TABS
1000.0000 mg | ORAL_TABLET | Freq: Four times a day (QID) | ORAL | Status: AC
  Administered 2023-01-28 – 2023-01-29 (×4): 1000 mg via ORAL
  Filled 2023-01-28 (×4): qty 2

## 2023-01-28 MED ORDER — PROPOFOL 10 MG/ML IV BOLUS
INTRAVENOUS | Status: AC
  Filled 2023-01-28: qty 20

## 2023-01-28 MED ORDER — BUPIVACAINE IN DEXTROSE 0.75-8.25 % IT SOLN
INTRATHECAL | Status: DC | PRN
  Administered 2023-01-28: 1.6 mL via INTRATHECAL

## 2023-01-28 MED ORDER — MIDAZOLAM HCL 5 MG/5ML IJ SOLN
INTRAMUSCULAR | Status: DC | PRN
  Administered 2023-01-28 (×2): 1 mg via INTRAVENOUS

## 2023-01-28 MED ORDER — POLYETHYLENE GLYCOL 3350 17 G PO PACK
17.0000 g | PACK | Freq: Two times a day (BID) | ORAL | Status: DC
  Administered 2023-01-28 – 2023-01-31 (×7): 17 g via ORAL
  Filled 2023-01-28 (×7): qty 1

## 2023-01-28 MED ORDER — MEPERIDINE HCL 50 MG/ML IJ SOLN: 6.2500 mg | INTRAMUSCULAR | Status: DC | PRN

## 2023-01-28 MED ORDER — DOCUSATE SODIUM 100 MG PO CAPS
100.0000 mg | ORAL_CAPSULE | Freq: Two times a day (BID) | ORAL | Status: DC
  Administered 2023-01-28 – 2023-01-31 (×7): 100 mg via ORAL
  Filled 2023-01-28 (×7): qty 1

## 2023-01-28 MED ORDER — POVIDONE-IODINE 10 % EX SWAB: 2.0000 | Freq: Once | CUTANEOUS | Status: DC

## 2023-01-28 MED ORDER — HYDROMORPHONE HCL 2 MG PO TABS
2.0000 mg | ORAL_TABLET | ORAL | Status: DC | PRN
  Administered 2023-01-28 – 2023-01-29 (×3): 3 mg via ORAL
  Filled 2023-01-28 (×3): qty 2

## 2023-01-28 MED ORDER — PHENYLEPHRINE 80 MCG/ML (10ML) SYRINGE FOR IV PUSH (FOR BLOOD PRESSURE SUPPORT)
PREFILLED_SYRINGE | INTRAVENOUS | Status: DC | PRN
  Administered 2023-01-28: 120 ug via INTRAVENOUS
  Administered 2023-01-28: 40 ug via INTRAVENOUS
  Administered 2023-01-28: 80 ug via INTRAVENOUS

## 2023-01-28 MED ORDER — MENTHOL 3 MG MT LOZG: 1.0000 | LOZENGE | OROMUCOSAL | Status: DC | PRN

## 2023-01-28 MED ORDER — SODIUM CHLORIDE (PF) 0.9 % IJ SOLN
INTRAMUSCULAR | Status: DC | PRN
  Administered 2023-01-28: 30 mL

## 2023-01-28 MED ORDER — ACETAMINOPHEN 325 MG PO TABS
325.0000 mg | ORAL_TABLET | Freq: Four times a day (QID) | ORAL | Status: DC | PRN
  Administered 2023-01-29 – 2023-01-30 (×2): 650 mg via ORAL
  Filled 2023-01-28 (×2): qty 2

## 2023-01-28 MED ORDER — TIZANIDINE HCL 4 MG PO TABS
2.0000 mg | ORAL_TABLET | Freq: Three times a day (TID) | ORAL | Status: DC | PRN
  Administered 2023-01-28 – 2023-01-29 (×2): 2 mg via ORAL
  Filled 2023-01-28 (×2): qty 1

## 2023-01-28 MED ORDER — ROPIVACAINE HCL 5 MG/ML IJ SOLN
INTRAMUSCULAR | Status: DC | PRN
  Administered 2023-01-28: 30 mL via PERINEURAL

## 2023-01-28 MED ORDER — ACETAMINOPHEN 10 MG/ML IV SOLN: 1000.0000 mg | Freq: Once | INTRAVENOUS | Status: DC | PRN

## 2023-01-28 MED ORDER — 0.9 % SODIUM CHLORIDE (POUR BTL) OPTIME
TOPICAL | Status: DC | PRN
  Administered 2023-01-28: 1000 mL

## 2023-01-28 MED ORDER — BISACODYL 10 MG RE SUPP: 10.0000 mg | Freq: Every day | RECTAL | Status: DC | PRN

## 2023-01-28 MED ORDER — PROPOFOL 500 MG/50ML IV EMUL
INTRAVENOUS | Status: DC | PRN
  Administered 2023-01-28: 65 ug/kg/min via INTRAVENOUS

## 2023-01-28 MED ORDER — MIDAZOLAM HCL 2 MG/2ML IJ SOLN
INTRAMUSCULAR | Status: AC
  Filled 2023-01-28: qty 2

## 2023-01-28 MED ORDER — ONDANSETRON HCL 4 MG/2ML IJ SOLN
INTRAMUSCULAR | Status: DC | PRN
  Administered 2023-01-28: 4 mg via INTRAVENOUS

## 2023-01-28 MED ORDER — EPINEPHRINE PF 1 MG/ML IJ SOLN
INTRAMUSCULAR | Status: AC
  Filled 2023-01-28: qty 1

## 2023-01-28 MED ORDER — CEFAZOLIN SODIUM-DEXTROSE 2-4 GM/100ML-% IV SOLN
2.0000 g | INTRAVENOUS | Status: AC
  Administered 2023-01-28: 2 g via INTRAVENOUS
  Filled 2023-01-28: qty 100

## 2023-01-28 MED ORDER — PANTOPRAZOLE SODIUM 40 MG PO TBEC
40.0000 mg | DELAYED_RELEASE_TABLET | Freq: Every day | ORAL | Status: DC
  Administered 2023-01-28 – 2023-01-31 (×4): 40 mg via ORAL
  Filled 2023-01-28 (×4): qty 1

## 2023-01-28 MED ORDER — DEXAMETHASONE SODIUM PHOSPHATE 10 MG/ML IJ SOLN
INTRAMUSCULAR | Status: DC | PRN
  Administered 2023-01-28: 4 mg via INTRAVENOUS

## 2023-01-28 MED ORDER — ORAL CARE MOUTH RINSE: 15.0000 mL | Freq: Once | OROMUCOSAL | Status: AC

## 2023-01-28 MED ORDER — SUCRALFATE 1 G PO TABS
1.0000 g | ORAL_TABLET | Freq: Three times a day (TID) | ORAL | Status: DC
  Administered 2023-01-28 – 2023-01-31 (×9): 1 g via ORAL
  Filled 2023-01-28 (×11): qty 1

## 2023-01-28 MED ORDER — TRANEXAMIC ACID 650 MG PO TABS
1950.0000 mg | ORAL_TABLET | Freq: Every day | ORAL | Status: DC
  Administered 2023-01-29: 1950 mg via ORAL
  Filled 2023-01-28 (×2): qty 3

## 2023-01-28 MED ORDER — KETOROLAC TROMETHAMINE 30 MG/ML IJ SOLN
INTRAMUSCULAR | Status: AC
  Filled 2023-01-28: qty 1

## 2023-01-28 MED ORDER — SODIUM CHLORIDE (PF) 0.9 % IJ SOLN
INTRAMUSCULAR | Status: AC
  Filled 2023-01-28: qty 30

## 2023-01-28 MED ORDER — CHLORHEXIDINE GLUCONATE 0.12 % MT SOLN
15.0000 mL | Freq: Once | OROMUCOSAL | Status: AC
  Administered 2023-01-28: 15 mL via OROMUCOSAL

## 2023-01-28 MED ORDER — CHLORTHALIDONE 25 MG PO TABS
12.5000 mg | ORAL_TABLET | Freq: Every day | ORAL | Status: DC
  Filled 2023-01-28 (×4): qty 1

## 2023-01-28 MED ORDER — HYDROMORPHONE HCL 1 MG/ML IJ SOLN
0.5000 mg | INTRAMUSCULAR | Status: DC | PRN
  Administered 2023-01-29 – 2023-01-30 (×2): 0.5 mg via INTRAVENOUS
  Filled 2023-01-28 (×2): qty 1

## 2023-01-28 MED ORDER — ACETAMINOPHEN 160 MG/5ML PO SOLN: 325.0000 mg | Freq: Once | ORAL | Status: DC | PRN

## 2023-01-28 MED ORDER — HYDROMORPHONE HCL 1 MG/ML IJ SOLN: 0.2500 mg | INTRAMUSCULAR | Status: DC | PRN

## 2023-01-28 MED ORDER — LIDOCAINE HCL (PF) 2 % IJ SOLN
INTRAMUSCULAR | Status: AC
  Filled 2023-01-28: qty 15

## 2023-01-28 MED ORDER — PROPOFOL 1000 MG/100ML IV EMUL
INTRAVENOUS | Status: AC
  Filled 2023-01-28: qty 100

## 2023-01-28 MED ORDER — ONDANSETRON HCL 4 MG PO TABS
4.0000 mg | ORAL_TABLET | Freq: Four times a day (QID) | ORAL | Status: DC | PRN
  Administered 2023-01-31: 4 mg via ORAL
  Filled 2023-01-28: qty 1

## 2023-01-28 MED ORDER — PHENYLEPHRINE 80 MCG/ML (10ML) SYRINGE FOR IV PUSH (FOR BLOOD PRESSURE SUPPORT)
PREFILLED_SYRINGE | INTRAVENOUS | Status: AC
  Filled 2023-01-28: qty 10

## 2023-01-28 MED ORDER — FENTANYL CITRATE (PF) 100 MCG/2ML IJ SOLN
INTRAMUSCULAR | Status: AC
  Filled 2023-01-28: qty 2

## 2023-01-28 MED ORDER — BUPIVACAINE HCL 0.25 % IJ SOLN
INTRAMUSCULAR | Status: AC
  Filled 2023-01-28: qty 1

## 2023-01-28 SURGICAL SUPPLY — 57 items
ADH SKN CLS APL DERMABOND .7 (GAUZE/BANDAGES/DRESSINGS) ×1
ATTUNE MED ANAT PAT 32 KNEE (Knees) IMPLANT
BAG COUNTER SPONGE SURGICOUNT (BAG) IMPLANT
BAG SPEC THK2 15X12 ZIP CLS (MISCELLANEOUS)
BAG SPNG CNTER NS LX DISP (BAG)
BAG ZIPLOCK 12X15 (MISCELLANEOUS) IMPLANT
BASEPLATE TIB CMT FB PCKT SZ3 (Knees) IMPLANT
BLADE SAW SGTL 11.0X1.19X90.0M (BLADE) IMPLANT
BLADE SAW SGTL 13.0X1.19X90.0M (BLADE) ×2 IMPLANT
BNDG CMPR 5X62 HK CLSR LF (GAUZE/BANDAGES/DRESSINGS) ×1
BNDG CMPR MED 10X6 ELC LF (GAUZE/BANDAGES/DRESSINGS) ×1
BNDG ELASTIC 6INX 5YD STR LF (GAUZE/BANDAGES/DRESSINGS) ×2 IMPLANT
BNDG ELASTIC 6X10 VLCR STRL LF (GAUZE/BANDAGES/DRESSINGS) IMPLANT
BOWL SMART MIX CTS (DISPOSABLE) ×2 IMPLANT
BSPLAT TIB 3 CMNT FXBRNG STRL (Knees) ×1 IMPLANT
COMP FEM CMT ATTUNE 3 RT (Joint) ×1 IMPLANT
COMPONENT FEM CMT ATTUNE 3 RT (Joint) IMPLANT
COVER SURGICAL LIGHT HANDLE (MISCELLANEOUS) ×2 IMPLANT
CUFF TOURN SGL QUICK 34 (TOURNIQUET CUFF) ×1
CUFF TRNQT CYL 34X4.125X (TOURNIQUET CUFF) ×2 IMPLANT
DERMABOND ADVANCED .7 DNX12 (GAUZE/BANDAGES/DRESSINGS) ×2 IMPLANT
DRAPE U-SHAPE 47X51 STRL (DRAPES) ×2 IMPLANT
DRESSING AQUACEL AG SP 3.5X10 (GAUZE/BANDAGES/DRESSINGS) ×2 IMPLANT
DRSG AQUACEL AG ADV 3.5X10 (GAUZE/BANDAGES/DRESSINGS) IMPLANT
DRSG AQUACEL AG SP 3.5X10 (GAUZE/BANDAGES/DRESSINGS) ×1
DURAPREP 26ML APPLICATOR (WOUND CARE) ×4 IMPLANT
ELECT REM PT RETURN 15FT ADLT (MISCELLANEOUS) ×2 IMPLANT
GLOVE BIO SURGEON STRL SZ 6 (GLOVE) ×2 IMPLANT
GLOVE BIOGEL PI IND STRL 6.5 (GLOVE) ×2 IMPLANT
GLOVE BIOGEL PI IND STRL 7.5 (GLOVE) ×2 IMPLANT
GLOVE ORTHO TXT STRL SZ7.5 (GLOVE) ×4 IMPLANT
GOWN STRL REUS W/ TWL LRG LVL3 (GOWN DISPOSABLE) ×4 IMPLANT
GOWN STRL REUS W/TWL LRG LVL3 (GOWN DISPOSABLE) ×2
HANDPIECE INTERPULSE COAX TIP (DISPOSABLE) ×1
HOLDER FOLEY CATH W/STRAP (MISCELLANEOUS) IMPLANT
KIT TURNOVER KIT A (KITS) IMPLANT
LINER TIB ATTUNE 3 8 RT STL (Liner) IMPLANT
MANIFOLD NEPTUNE II (INSTRUMENTS) ×2 IMPLANT
NDL SAFETY ECLIP 18X1.5 (MISCELLANEOUS) IMPLANT
NS IRRIG 1000ML POUR BTL (IV SOLUTION) ×2 IMPLANT
PACK TOTAL KNEE CUSTOM (KITS) ×2 IMPLANT
PIN FIX SIGMA LCS THRD HI (PIN) IMPLANT
PROTECTOR NERVE ULNAR (MISCELLANEOUS) ×2 IMPLANT
SET HNDPC FAN SPRY TIP SCT (DISPOSABLE) ×2 IMPLANT
SET PAD KNEE POSITIONER (MISCELLANEOUS) ×2 IMPLANT
SPIKE FLUID TRANSFER (MISCELLANEOUS) ×4 IMPLANT
SUT MNCRL AB 4-0 PS2 18 (SUTURE) ×2 IMPLANT
SUT STRATAFIX PDS+ 0 24IN (SUTURE) ×2 IMPLANT
SUT VIC AB 1 CT1 36 (SUTURE) ×2 IMPLANT
SUT VIC AB 2-0 CT1 27 (SUTURE) ×2
SUT VIC AB 2-0 CT1 TAPERPNT 27 (SUTURE) ×4 IMPLANT
SYR 3ML LL SCALE MARK (SYRINGE) ×2 IMPLANT
TOWEL GREEN STERILE FF (TOWEL DISPOSABLE) ×2 IMPLANT
TRAY FOLEY MTR SLVR 16FR STAT (SET/KITS/TRAYS/PACK) ×2 IMPLANT
TUBE SUCTION HIGH CAP CLEAR NV (SUCTIONS) ×2 IMPLANT
WATER STERILE IRR 1000ML POUR (IV SOLUTION) ×4 IMPLANT
WRAP KNEE MAXI GEL POST OP (GAUZE/BANDAGES/DRESSINGS) ×2 IMPLANT

## 2023-01-28 NOTE — Care Plan (Signed)
Ortho Bundle Case Management Note  Patient Details  Name: [name] MRN: [id number] Date of Birth: [birth year]                  R TKA on 01/28/23.  DCP: Home with husband.  DME: No needs. Has RW.  PT: EO 5/3   DME Arranged:  N/A DME Agency:       Additional Comments: Please contact me with any questions of if this plan should need to change.    Despina Pole, Case Manager  [facility]  [phone] 01/28/2023, 11:00 AM

## 2023-01-28 NOTE — Progress Notes (Signed)
Pt refused cpap tonight.  Pt stated she is used to a "Resmed" cpap machine and does not want to use the hospital-provided "Philips" machine.  Pt stated she has heard of several recalls with the "Philips" machine and does not want to wear it.  Pt was advised that RT is available all night should she change her mind.  RN notified.

## 2023-01-28 NOTE — H&P (Signed)
TOTAL KNEE ADMISSION H&P  Patient is being admitted for right total knee arthroplasty.  Subjective:  Chief Complaint:right knee pain.  HPI: [name], 67 y.o. female, has a history of pain and functional disability in the right knee due to arthritis and has failed non-surgical conservative treatments for greater than 12 weeks to includeNSAID's and/or analgesics, corticosteriod injections, and activity modification.  Onset of symptoms was gradual, starting 3 years ago with gradually worsening course since that time. The patient noted no past surgery on the right knee(s).  Patient currently rates pain in the right knee(s) at 8 out of 10 with activity. Patient has worsening of pain with activity and weight bearing and pain that interferes with activities of daily living.  Patient has evidence of joint space narrowing by imaging studies. There is no active infection.  Patient Active Problem List   Diagnosis Date Noted   ASCVD (arteriosclerotic cardiovascular disease) 12/23/2022   Preventative health care 10/01/2022   Elevated serum creatinine 06/26/2021   COVID-19 04/26/2021   Diverticulitis 02/20/2021   Depression 07/10/2020   History of gastric bypass 07/10/2020   Has daytime drowsiness 05/09/2020   Other fatigue 02/29/2020   Short of breath on exertion 02/29/2020   Vitamin D deficiency 08/11/2018   Gastric bypass status for obesity 07/30/2018   Hives 06/07/2018   Primary osteoarthritis of right knee 01/23/2018   Primary osteoarthritis of both hands 01/23/2018   History of iritis 11/28/2017   History of total hip replacement, left 11/28/2017   Primary osteoarthritis of both knees 11/28/2017   Obstructive sleep apnea 03/08/2016   Seasonal allergies 05/14/2015   Obesity (BMI 30-39.9) 03/16/2014   Long-term current use of steroids 04/20/2013   Noninfectious gastroenteritis and colitis 03/17/2013   Calf pain 10/13/2012   Acute sprain or strain of cervical region 04/10/2012   LEG  CRAMPS, NOCTURNAL 11/14/2010   OTHER SPECIFIED DISEASE OF WHITE BLOOD CELLS 07/23/2010   CONSTIPATION 04/06/2010   CORNEAL ABRASION, LEFT 01/22/2010   CHEST PAIN UNSPECIFIED 10/11/2009   BLOOD IN STOOL 12/02/2008   Ankylosing spondylitis (HCC) 03/31/2008   MUSCLE PAIN 03/31/2008   BARIATRIC SURGERY STATUS 01/14/2008   THYROID CYST 06/26/2007   Class 2 severe obesity with serious comorbidity and body mass index (BMI) of 36.0 to 36.9 in adult [facility]) 06/26/2007   Essential hypertension 06/26/2007   GERD 06/26/2007   IBS 06/26/2007   LYMPHADENOPATHY 06/10/2007   History of thyroid nodule 06/10/2007   Past Medical History:  Diagnosis Date   Abdominal pain, other specified site 03/31/2008   Centricity Description: FLANK PAIN, RIGHT Qualifier: Diagnosis of  By: Alwyn Ren MD, William   Centricity Description: ABDOMINAL PAIN OTHER SPECIFIED SITE Qualifier: Diagnosis of  By: Janit Bern     Acute sprain or strain of cervical region 04/10/2012   ALLERGIC RHINITIS 06/26/2007   Qualifier: Diagnosis of  By: Charlsie Quest RMA, Lucy     ANKYLOSING SPONDYLITIS 03/31/2008   Qualifier: Diagnosis of  By: Alwyn Ren MD, William     Ankylosing spondylitis (HCC)    neck and spin   Ankylosing spondylitis of multiple sites in spine [facility])    Arthritis    Back pain    Bariatric surgery status 01/14/2008   Qualifier: Diagnosis of  By: Janit Bern     Blood in stool 12/02/2008   Qualifier: Diagnosis of  By: Janit Bern     Blood transfusion without reported diagnosis    Calf pain 10/13/2012   CHEST PAIN UNSPECIFIED 10/11/2009  Qualifier: Diagnosis of  By: Arlyce Dice MD, Barbette Hair    CONSTIPATION 04/06/2010   Qualifier: Diagnosis of  By: Janit Bern     CORNEAL ABRASION, LEFT 01/22/2010   Qualifier: Diagnosis of  By: Janit Bern     Gastritis    GERD 06/26/2007   Qualifier: Diagnosis of  By: Charlsie Quest RMA, Lucy     GERD (gastroesophageal reflux disease)    Hiatal hernia    Hyperlipidemia     Hypertension    no medications now   IBS 06/26/2007   Qualifier: Diagnosis of  By: Charlsie Quest RMA, Lucy     Joint pain    Lactose intolerance    LEG CRAMPS, NOCTURNAL 11/14/2010   Qualifier: Diagnosis of  By: Floydene Flock     Long-term current use of steroids 04/20/2013   LYMPHADENOPATHY 06/10/2007   Qualifier: Diagnosis of  By: Janit Bern     MORBID OBESITY 06/26/2007   Qualifier: Diagnosis of  By: Samara Snide     MUSCLE PAIN 03/31/2008   Qualifier: Diagnosis of  By: Alwyn Ren MD, William     Noninfectious gastroenteritis and colitis 03/17/2013   Obesity (BMI 30-39.9) 03/16/2014   OSA (obstructive sleep apnea) 03/08/2016   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/23/2010   Qualifier: Diagnosis of  By: Janit Bern     Rheumatoid arthritis (HCC)    Seasonal allergies 05/14/2015   THYROID CYST 06/26/2007   Qualifier: Diagnosis of  By: Charlsie Quest RMA, Lucy     THYROID NODULE, HX OF 06/10/2007   Qualifier: Diagnosis of  By: Janit Bern     Vitamin D deficiency     Past Surgical History:  Procedure Laterality Date   BARIATRIC SURGERY     BREAST REDUCTION SURGERY  1996   COLONOSCOPY  10/24/2009   kaplan-normal exam   ESOPHAGOGASTRODUODENOSCOPY     pelvis replacement  2010   TOTAL HIP ARTHROPLASTY     x 2 left, one revision   UPPER GASTROINTESTINAL ENDOSCOPY      Current Facility-Administered Medications  Medication Dose Route Frequency Provider Last Rate Last Admin   ceFAZolin (ANCEF) IVPB 2g/100 mL premix  2 g Intravenous On Call to OR Cassandria Anger, PA-C       chlorhexidine (PERIDEX) 0.12 % solution 15 mL  15 mL Mouth/Throat Once Eilene Ghazi, MD       Or   Oral care mouth rinse  15 mL Mouth Rinse Once Eilene Ghazi, MD       dexamethasone (DECADRON) injection 8 mg  8 mg Intravenous Once Cassandria Anger, PA-C       lactated ringers infusion   Intravenous Continuous Cassandria Anger, PA-C       lactated ringers infusion   Intravenous Continuous Eilene Ghazi, MD       povidone-iodine 10 % swab 2 Application  2 Application Topical Once Cassandria Anger, PA-C       tranexamic acid (CYKLOKAPRON) IVPB 1,000 mg  1,000 mg Intravenous To OR Cassandria Anger, PA-C       Allergies  Allergen Reactions   Aspirin     REACTION: intolerance-gi upset   Codeine Itching   Morphine Itching   Norvasc [Amlodipine]    Sulfa Antibiotics Other (See Comments)    Unsure of reaction   Sulfonamide Derivatives     Unsure of reaction    Social History   Tobacco Use   Smoking status: Never   Smokeless  tobacco: Never  Substance Use Topics   Alcohol use: No    Alcohol/week: 0.0 standard drinks of alcohol    Family History  Problem Relation Age of Onset   Hypertension Mother    High Cholesterol Mother    Diabetes Mother    Bladder Cancer Mother    Arthritis Father    Hypertension Father    Diabetes Father    High Cholesterol Father    Hypertension Sister    Diabetes Sister    Melanoma Maternal Grandmother    Breast cancer Maternal Grandmother        and Aunt   Colon cancer Neg Hx    Esophageal cancer Neg Hx    Rectal cancer Neg Hx    Stomach cancer Neg Hx    Colon polyps Neg Hx      Review of Systems  Constitutional:  Negative for chills and fever.  Respiratory:  Negative for cough and shortness of breath.   Cardiovascular:  Negative for chest pain.  Gastrointestinal:  Negative for nausea and vomiting.  Musculoskeletal:  Positive for arthralgias.     Objective:  Physical Exam Well nourished and well developed. General: Alert and oriented x3, cooperative and pleasant, no acute distress. Head: normocephalic, atraumatic, neck supple. Eyes: EOMI.  Musculoskeletal: Right Knee Exam: No erythema or warmth Moderate effusion AROM 0-120 Mild tenderness laterally Crepitus on ROM  Calves soft and nontender. Motor function intact in LE. Strength 5/5 LE bilaterally. Neuro: Distal pulses 2+. Sensation to light touch intact in  LE.  Vital signs in last 24 hours: Weight:  [84 kg] 84 kg (04/30 0544)  Labs:   Estimated body mass index is 33.87 kg/m as calculated from the following:   Height as of this encounter: 5\' 2"  (1.575 m).   Weight as of this encounter: 84 kg.   Imaging Review Plain radiographs demonstrate severe degenerative joint disease of the right knee(s). The overall alignment isneutral. The bone quality appears to be adequate for age and reported activity level.      Assessment/Plan:  End stage arthritis, right knee   The patient history, physical examination, clinical judgment of the provider and imaging studies are consistent with end stage degenerative joint disease of the right knee(s) and total knee arthroplasty is deemed medically necessary. The treatment options including medical management, injection therapy arthroscopy and arthroplasty were discussed at length. The risks and benefits of total knee arthroplasty were presented and reviewed. The risks due to aseptic loosening, infection, stiffness, patella tracking problems, thromboembolic complications and other imponderables were discussed. The patient acknowledged the explanation, agreed to proceed with the plan and consent was signed. Patient is being admitted for inpatient treatment for surgery, pain control, PT, OT, prophylactic antibiotics, VTE prophylaxis, progressive ambulation and ADL's and discharge planning. The patient is planning to be discharged  home.  Therapy Plans: outpatient therapy at [facility] on 5/3 Disposition: Home with husband Planned DVT Prophylaxis: Xarelto 10mg  daily DME needed: needs a walker PCP: [facility] - clearance received Cardio: Dr. Anne Fu - clearance received TXA: IV Allergies: aspirin - abdominal pain, codeine - nausea/vomiting/itching, morphine - itching Anesthesia Concerns: none BMI: 34.8 Last HgbA1c: Not diabetic   Other: - ICE MACHINE AT HOSPITAL - dilaudid (n/v), tizanidine, tylenol, toradol? -  hx of gastric bypass - no hx of VTE or cancer - hx of low platelets after 2nd hip replacement by Dr. Leslee Home    Patient's anticipated LOS is less than 2 midnights, meeting these requirements: - Younger than 65 -  Lives within 1 hour of care - Has a competent adult at home to recover with post-op recover - NO history of  - Chronic pain requiring opiods  - Diabetes  - Coronary Artery Disease  - Heart failure  - Heart attack  - Stroke  - DVT/VTE  - Cardiac arrhythmia  - Respiratory Failure/COPD  - Renal failure  - Anemia  - Advanced Liver disease  Rosalene Billings, PA-C Orthopedic Surgery [facility] [place] [phone]

## 2023-01-28 NOTE — Anesthesia Procedure Notes (Signed)
Spinal  Start time: 01/28/2023 7:18 AM End time: 01/28/2023 7:20 AM Reason for block: surgical anesthesia Staffing Performed: anesthesiologist  Anesthesiologist: Shelton Silvas, MD Performed by: Shelton Silvas, MD Authorized by: Shelton Silvas, MD   Preanesthetic Checklist Completed: patient identified, IV checked, site marked, risks and benefits discussed, surgical consent, monitors and equipment checked, pre-op evaluation and timeout performed Spinal Block Patient position: sitting Prep: DuraPrep and site prepped and draped Location: L3-4 Injection technique: single-shot Needle Needle type: Pencan  Needle gauge: 24 G Needle length: 10 cm Needle insertion depth: 10 cm Additional Notes Patient tolerated well. No immediate complications.  Functioning IV was confirmed and monitors were applied. Sterile prep and drape, including hand hygiene and sterile gloves were used. The patient was positioned and the back was prepped. The skin was anesthetized with lidocaine. Free flow of clear CSF was obtained prior to injecting local anesthetic into the CSF. The spinal needle aspirated freely following injection. The needle was carefully withdrawn. The patient tolerated the procedure well.

## 2023-01-28 NOTE — Anesthesia Postprocedure Evaluation (Signed)
Anesthesia Post Note  Patient: ETANA BEETS  Procedure(s) Performed: TOTAL KNEE ARTHROPLASTY (Right: Knee)     Patient location during evaluation: PACU Anesthesia Type: Spinal Level of consciousness: oriented and awake and alert Pain management: pain level controlled Vital Signs Assessment: post-procedure vital signs reviewed and stable Respiratory status: spontaneous breathing, respiratory function stable and patient connected to nasal cannula oxygen Cardiovascular status: blood pressure returned to baseline and stable Postop Assessment: no headache, no backache and no apparent nausea or vomiting Anesthetic complications: no  No notable events documented.  Last Vitals:  Vitals:   01/28/23 1015 01/28/23 1050  BP: 118/69 125/80  Pulse: 69 64  Resp: 17 18  Temp:  (!) 36.4 C  SpO2: 100% 98%    Last Pain:  Vitals:   01/28/23 1239  TempSrc:   PainSc: 5                  Shelton Silvas

## 2023-01-28 NOTE — Transfer of Care (Signed)
Immediate Anesthesia Transfer of Care Note  Patient: [name]  Procedure(s) Performed: Procedure(s): TOTAL KNEE ARTHROPLASTY (Right)  Patient Location: PACU  Anesthesia Type:Spinal  Level of Consciousness:  sedated, patient cooperative and responds to stimulation  Airway & Oxygen Therapy:Patient Spontanous Breathing and Patient connected to face mask oxgen  Post-op Assessment:  Report given to PACU RN and Post -op Vital signs reviewed and stable  Post vital signs:  Reviewed and stable  Last Vitals:  Vitals:   01/28/23 0621  BP: 124/71  Pulse: 67  Resp: 19  Temp: 36.7 C  SpO2: 100%    Complications: No apparent anesthesia complications

## 2023-01-28 NOTE — Op Note (Signed)
NAME:  [name]                      MEDICAL RECORD NO.:  [id number]                             FACILITY:  [facility]      PHYSICIAN:  Madlyn Frankel. Charlann Boxer, M.D.  DATE OF BIRTH:  01/19/1956      DATE OF PROCEDURE:  01/28/2023                                     OPERATIVE REPORT         PREOPERATIVE DIAGNOSIS:  Right knee osteoarthritis.      POSTOPERATIVE DIAGNOSIS:  Right knee osteoarthritis.      FINDINGS:  The patient was noted to have complete loss of cartilage and   bone-on-bone arthritis with associated osteophytes in the patellofemoral and lateral compartments of   the knee with a significant synovitis and associated effusion.  The patient had failed months of conservative treatment including medications, injection therapy, activity modification.     PROCEDURE:  Right total knee replacement.      COMPONENTS USED:  DePuy Attune FB CR MS knee   system, a size 3 femur, 3 tibia, size 8 mm CR MS AOX insert, and 32 anatomic patellar   button.      SURGEON:  Madlyn Frankel. Charlann Boxer, M.D.      ASSISTANT:  Rosalene Billings, PA-C.      ANESTHESIA:  Regional and Spinal.      SPECIMENS:  None.      COMPLICATION:  None.      DRAINS:  None.  EBL: <100 cc      TOURNIQUET TIME:  30 min at 225 mmHg     The patient was stable to the recovery room.      INDICATION FOR PROCEDURE:  [name] is a 67 y.o. female patient of   mine.  The patient had been seen, evaluated, and treated for months conservatively in the   office with medication, activity modification, and injections.  The patient had   radiographic changes of bone-on-bone arthritis with endplate sclerosis and osteophytes noted.  Based on the radiographic changes and failed conservative measures, the patient   decided to proceed with definitive treatment, total knee replacement.  Risks of infection, DVT, component failure, need for revision surgery, neurovascular injury were reviewed in the office setting.  The postop course was  reviewed stressing the efforts to maximize post-operative satisfaction and function.  Consent was obtained for benefit of pain   relief.      PROCEDURE IN DETAIL:  The patient was brought to the operative theater.   Once adequate anesthesia, preoperative antibiotics, 2 gm of Ancef,1 gm of Tranexamic Acid, and 10 mg of Decadron administered, the patient was positioned supine with a right thigh tourniquet placed.  The  right lower extremity was prepped and draped in sterile fashion.  A time-   out was performed identifying the patient, planned procedure, and the appropriate extremity.      The right lower extremity was placed in the [facility] leg holder.  The leg was   exsanguinated, tourniquet elevated to 225 mmHg.  A midline incision was   made followed by median parapatellar arthrotomy.  Following initial   exposure, attention was  first directed to the patella.  Precut   measurement was noted to be 20 mm.  I resected down to 13 mm and used a   32 anatomic patellar button to restore patellar height as well as cover the cut surface.      The lug holes were drilled and a metal shim was placed to protect the   patella from retractors and saw blade during the procedure.      At this point, attention was now directed to the femur.  The femoral   canal was opened with a drill, irrigated to try to prevent fat emboli.  An   intramedullary rod was passed at 3 degrees valgus, 9 mm of bone was   resected off the distal femur.  Following this resection, the tibia was   subluxated anteriorly.  Using the extramedullary guide, 3 mm of bone was resected off   the proximal lateral tibia.  We confirmed the gap would be   stable medially and laterally with a size 6 spacer block as well as confirmed that the tibial cut was perpendicular in the coronal plane, checking with an alignment rod.      Once this was done, I sized the femur to be a size 3 in the anterior-   posterior dimension, chose a standard component  based on medial and   lateral dimension.  The size 3 rotation block was then pinned in   position anterior referenced using the C-clamp to set rotation.  The   anterior, posterior, and  chamfer cuts were made without difficulty nor   notching making certain that I was along the anterior cortex to help   with flexion gap stability.      The final shim cut was made off the lateral aspect of distal femur.      At this point, the tibia was sized to be a size 3.  The size 3 tray was   then pinned in position based off a trial reduction to determine rotation of the tray then drilled, and keel punched.  Trial reduction was now carried with a 3 femur,  3 tibia, a size 8 mm CR MS insert, and the 32 anatomic patella botton.  The knee was brought to full extension with good flexion stability with the patella   tracking through the trochlea without application of pressure.  Given   all these findings the trial components removed.  Final components were   opened and cement was mixed.  The knee was irrigated with normal saline solution and pulse lavage.  The synovial lining was   then injected with 30 cc of 0.25% Marcaine with epinephrine, 1 cc of Toradol and 30 cc of NS for a total of 61 cc.     Final implants were then cemented onto cleaned and dried cut surfaces of bone with the knee brought to extension with a size 8 mm CR MS trial insert.      Once the cement had fully cured, excess cement was removed   throughout the knee.  I confirmed that I was satisfied with the range of   motion and stability, and the final size 8 mm FB CR MS AOX insert was chosen.  It was   placed into the knee.      The tourniquet had been let down at 30 minutes.  No significant   hemostasis was required.  The extensor mechanism was then reapproximated using #1 Vicryl and #1 Stratafix sutures with the knee  in flexion.  The   remaining wound was closed with 2-0 Vicryl and running 4-0 Monocryl.   The knee was cleaned,  dried, dressed sterilely using Dermabond and   Aquacel dressing.  The patient was then   brought to recovery room in stable condition, tolerating the procedure   well.   Please note that Physician Assistant, Rosalene Billings, PA-C was present for the entirety of the case, and was utilized for pre-operative positioning, peri-operative retractor management, general facilitation of the procedure and for primary wound closure at the end of the case.              Madlyn Frankel Charlann Boxer, M.D.    01/28/2023 7:27 AM

## 2023-01-28 NOTE — Evaluation (Signed)
Physical Therapy Evaluation Patient Details Name: [name] MRN: [id number] DOB: 1956/03/03 Today's Date: 01/28/2023  History of Present Illness  67 yo female presents to therapy s/p R TKA on 01/28/2023 due to failure of conservative measures. Pt has PMH including but is not limited to: DOE, gastric bypass, L THA (2019) and revision, OSA, ankylosing spondylitis, HTN, IBS, and ASCVD.  Clinical Impression    [name] is a 67 y.o. female POD 0 s/p R TKA. Patient reports mod I at [facility] level with mobility at baseline. Patient is now limited by functional impairments (see PT problem list below) and requires min A for bed mobility and min guard and cues for transfers. Patient was able to ambulate 6 feet with RW and min guard level of assist. Mobility limited at time of eval due to reports of nausea with vomiting and dizziness on initial standing.  Patient instructed in exercise to facilitate ROM and circulation to manage edema. Patient will benefit from continued skilled PT interventions to address impairments and progress towards PLOF. Acute PT will follow to progress mobility and stair training in preparation for safe discharge home with family support and OPPT starting 01/31/2023.      Recommendations for follow up therapy are one component of a multi-disciplinary discharge planning process, led by the attending physician.  Recommendations may be updated based on patient status, additional functional criteria and insurance authorization.  Follow Up Recommendations       Assistance Recommended at Discharge Intermittent Supervision/Assistance  Patient can return home with the following  A little help with walking and/or transfers;A little help with bathing/dressing/bathroom;Assistance with cooking/housework;Assist for transportation;Help with stairs or ramp for entrance    Equipment Recommendations Rolling walker (2 wheels)  Recommendations for Other Services       Functional Status  Assessment Patient has had a recent decline in their functional status and demonstrates the ability to make significant improvements in function in a reasonable and predictable amount of time.     Precautions / Restrictions Precautions Precautions: Knee;Fall Restrictions Weight Bearing Restrictions: No      Mobility  Bed Mobility Overal bed mobility: Needs Assistance Bed Mobility: Supine to Sit     Supine to sit: Min assist     General bed mobility comments: HOB elevated, cues and use of bed rail    Transfers Overall transfer level: Needs assistance Equipment used: Rolling walker (2 wheels) Transfers: Sit to/from Stand, Bed to chair/wheelchair/BSC Sit to Stand: Min guard Stand pivot transfers: Min guard         General transfer comment: cues for proper UE and AD placement. pt became nauseated with STS from EOB and was able to perform SPT bed to recliner    Ambulation/Gait Ambulation/Gait assistance: Min guard Gait Distance (Feet): 6 Feet Assistive device: Rolling walker (2 wheels) Gait Pattern/deviations: Step-to pattern, Antalgic, Trunk flexed Gait velocity: decreased     General Gait Details: limited gait due to reports of pain and nausea pt stated mild dizzines  Stairs            Wheelchair Mobility    Modified Rankin (Stroke Patients Only)       Balance Overall balance assessment: Needs assistance Sitting-balance support: Bilateral upper extremity supported, Feet supported Sitting balance-Leahy Scale: Fair     Standing balance support: During functional activity, Reliant on assistive device for balance, Bilateral upper extremity supported Standing balance-Leahy Scale: Poor  Pertinent Vitals/Pain Pain Assessment Pain Assessment: 0-10 Pain Score: 5  Pain Location: R knee Pain Descriptors / Indicators: Aching, Constant, Discomfort, Operative site guarding Pain Intervention(s): Limited activity within  patient's tolerance, Monitored during session, Premedicated before session, Repositioned, Ice applied    Home Living Family/patient expects to be discharged to:: Private residence Living Arrangements: Spouse/significant other Available Help at Discharge: Family Type of Home: House Home Access: Stairs to enter Entrance Stairs-Rails: Left Entrance Stairs-Number of Steps: 3 Alternate Level Stairs-Number of Steps: 15 Home Layout: Two level Home Equipment: Cane - single point      Prior Function Prior Level of Function : Independent/Modified Independent             Mobility Comments: mod I with SPC level for all ADLs, self care tasks, and IADLs.       Hand Dominance        Extremity/Trunk Assessment        Lower Extremity Assessment Lower Extremity Assessment: RLE deficits/detail RLE Deficits / Details: ankle DF/PF 5/5; SLR with AA RLE Sensation: WNL (pt reports abn sensation once in standing and tingling R LE)    Cervical / Trunk Assessment Cervical / Trunk Assessment: Other exceptions (noted limited c-spine ROM)  Communication   Communication: No difficulties  Cognition Arousal/Alertness: Awake/alert Behavior During Therapy: WFL for tasks assessed/performed Overall Cognitive Status: Within Functional Limits for tasks assessed                                          General Comments      Exercises Total Joint Exercises Ankle Circles/Pumps: AROM, Both, 20 reps   Assessment/Plan    PT Assessment Patient needs continued PT services  PT Problem List Decreased strength;Decreased activity tolerance;Decreased range of motion;Decreased balance;Decreased mobility;Decreased coordination;Decreased cognition;Pain       PT Treatment Interventions DME instruction;Gait training;Stair training;Functional mobility training;Therapeutic activities;Therapeutic exercise;Balance training;Neuromuscular re-education;Patient/family education;Modalities    PT  Goals (Current goals can be found in the Care Plan section)  Acute Rehab PT Goals Patient Stated Goal: walk vagas without pain PT Goal Formulation: With patient Time For Goal Achievement: 02/10/23 Potential to Achieve Goals: Good    Frequency 7X/week     Co-evaluation               AM-PAC PT "6 Clicks" Mobility  Outcome Measure Help needed turning from your back to your side while in a flat bed without using bedrails?: A Little Help needed moving from lying on your back to sitting on the side of a flat bed without using bedrails?: A Little Help needed moving to and from a bed to a chair (including a wheelchair)?: A Little Help needed standing up from a chair using your arms (e.g., wheelchair or bedside chair)?: A Little Help needed to walk in hospital room?: A Little Help needed climbing 3-5 steps with a railing? : Total 6 Click Score: 16    End of Session Equipment Utilized During Treatment: Gait belt Activity Tolerance: Treatment limited secondary to medical complications (Comment);Patient limited by pain;Patient limited by fatigue (N and V) Patient left: in chair;with call bell/phone within reach;with family/visitor present Nurse Communication: Mobility status;Patient requests pain meds PT Visit Diagnosis: Unsteadiness on feet (R26.81);Other abnormalities of gait and mobility (R26.89);Muscle weakness (generalized) (M62.81);Difficulty in walking, not elsewhere classified (R26.2);Pain Pain - Right/Left: Right Pain - part of body: Knee    Time: 4098-1191  PT Time Calculation (min) (ACUTE ONLY): 53 min   Charges:   PT Evaluation $PT Eval Low Complexity: 1 Low PT Treatments $Gait Training: 8-22 mins $Therapeutic Exercise: 8-22 mins $Therapeutic Activity: 8-22 mins        Rica Mote, PT   Jacqualyn Posey 01/28/2023, 4:44 PM

## 2023-01-28 NOTE — Discharge Instructions (Addendum)
INSTRUCTIONS AFTER JOINT REPLACEMENT   Remove items at home which could result in a fall. This includes throw rugs or furniture in walking pathways ICE to the affected joint every three hours while awake for 30 minutes at a time, for at least the first 3-5 days, and then as needed for pain and swelling.  Continue to use ice for pain and swelling. You may notice swelling that will progress down to the foot and ankle.  This is normal after surgery.  Elevate your leg when you are not up walking on it.   Continue to use the breathing machine you got in the hospital (incentive spirometer) which will help keep your temperature down.  It is common for your temperature to cycle up and down following surgery, especially at night when you are not up moving around and exerting yourself.  The breathing machine keeps your lungs expanded and your temperature down.   DIET:  As you were doing prior to hospitalization, we recommend a well-balanced diet.  DRESSING / WOUND CARE / SHOWERING  Keep the surgical dressing until follow up.  The dressing is water proof, so you can shower without any extra covering.  IF THE DRESSING FALLS OFF or the wound gets wet inside, change the dressing with sterile gauze.  Please use good hand washing techniques before changing the dressing.  Do not use any lotions or creams on the incision until instructed by your surgeon.    ACTIVITY  Increase activity slowly as tolerated, but follow the weight bearing instructions below.   No driving for 6 weeks or until further direction given by your physician.  You cannot drive while taking narcotics.  No lifting or carrying greater than 10 lbs. until further directed by your surgeon. Avoid periods of inactivity such as sitting longer than an hour when not asleep. This helps prevent blood clots.  You may return to work once you are authorized by your doctor.     WEIGHT BEARING   Weight bearing as tolerated with assist device (walker, cane,  etc) as directed, use it as long as suggested by your surgeon or therapist, typically at least 4-6 weeks.   EXERCISES  Results after joint replacement surgery are often greatly improved when you follow the exercise, range of motion and muscle strengthening exercises prescribed by your doctor. Safety measures are also important to protect the joint from further injury. Any time any of these exercises cause you to have increased pain or swelling, decrease what you are doing until you are comfortable again and then slowly increase them. If you have problems or questions, call your caregiver or physical therapist for advice.   Rehabilitation is important following a joint replacement. After just a few days of immobilization, the muscles of the leg can become weakened and shrink (atrophy).  These exercises are designed to build up the tone and strength of the thigh and leg muscles and to improve motion. Often times heat used for twenty to thirty minutes before working out will loosen up your tissues and help with improving the range of motion but do not use heat for the first two weeks following surgery (sometimes heat can increase post-operative swelling).   These exercises can be done on a training (exercise) mat, on the floor, on a table or on a bed. Use whatever works the best and is most comfortable for you.    Use music or television while you are exercising so that the exercises are a pleasant break in your   day. This will make your life better with the exercises acting as a break in your routine that you can look forward to.   Perform all exercises about fifteen times, three times per day or as directed.  You should exercise both the operative leg and the other leg as well.  Exercises include:   Quad Sets - Tighten up the muscle on the front of the thigh (Quad) and hold for 5-10 seconds.   Straight Leg Raises - With your knee straight (if you were given a brace, keep it on), lift the leg to 60  degrees, hold for 3 seconds, and slowly lower the leg.  Perform this exercise against resistance later as your leg gets stronger.  Leg Slides: Lying on your back, slowly slide your foot toward your buttocks, bending your knee up off the floor (only go as far as is comfortable). Then slowly slide your foot back down until your leg is flat on the floor again.  Angel Wings: Lying on your back spread your legs to the side as far apart as you can without causing discomfort.  Hamstring Strength:  Lying on your back, push your heel against the floor with your leg straight by tightening up the muscles of your buttocks.  Repeat, but this time bend your knee to a comfortable angle, and push your heel against the floor.  You may put a pillow under the heel to make it more comfortable if necessary.   A rehabilitation program following joint replacement surgery can speed recovery and prevent re-injury in the future due to weakened muscles. Contact your doctor or a physical therapist for more information on knee rehabilitation.    CONSTIPATION  Constipation is defined medically as fewer than three stools per week and severe constipation as less than one stool per week.  Even if you have a regular bowel pattern at home, your normal regimen is likely to be disrupted due to multiple reasons following surgery.  Combination of anesthesia, postoperative narcotics, change in appetite and fluid intake all can affect your bowels.   YOU MUST use at least one of the following options; they are listed in order of increasing strength to get the job done.  They are all available over the counter, and you may need to use some, POSSIBLY even all of these options:    Drink plenty of fluids (prune juice may be helpful) and high fiber foods Colace 100 mg by mouth twice a day  Senokot for constipation as directed and as needed Dulcolax (bisacodyl), take with full glass of water  Miralax (polyethylene glycol) once or twice a day as  needed.  If you have tried all these things and are unable to have a bowel movement in the first 3-4 days after surgery call either your surgeon or your primary doctor.    If you experience loose stools or diarrhea, hold the medications until you stool forms back up.  If your symptoms do not get better within 1 week or if they get worse, check with your doctor.  If you experience "the worst abdominal pain ever" or develop nausea or vomiting, please contact the office immediately for further recommendations for treatment.   ITCHING:  If you experience itching with your medications, try taking only a single pain pill, or even half a pain pill at a time.  You can also use Benadryl over the counter for itching or also to help with sleep.   TED HOSE STOCKINGS:  Use stockings on both   legs until for at least 2 weeks or as directed by physician office. They may be removed at night for sleeping.  MEDICATIONS:  See your medication summary on the "After Visit Summary" that nursing will review with you.  You may have some home medications which will be placed on hold until you complete the course of blood thinner medication.  It is important for you to complete the blood thinner medication as prescribed.  PRECAUTIONS:  If you experience chest pain or shortness of breath - call 911 immediately for transfer to the hospital emergency department.   If you develop a fever greater that 101 F, purulent drainage from wound, increased redness or drainage from wound, foul odor from the wound/dressing, or calf pain - CONTACT YOUR SURGEON.                                                   FOLLOW-UP APPOINTMENTS:  If you do not already have a post-op appointment, please call the office for an appointment to be seen by your surgeon.  Guidelines for how soon to be seen are listed in your "After Visit Summary", but are typically between 1-4 weeks after surgery.  OTHER INSTRUCTIONS:   Knee Replacement:  Do not place pillow  under knee, focus on keeping the knee straight while resting. CPM instructions: 0-90 degrees, 2 hours in the morning, 2 hours in the afternoon, and 2 hours in the evening. Place foam block, curve side up under heel at all times except when in CPM or when walking.  DO NOT modify, tear, cut, or change the foam block in any way.  POST-OPERATIVE OPIOID TAPER INSTRUCTIONS: It is important to wean off of your opioid medication as soon as possible. If you do not need pain medication after your surgery it is ok to stop day one. Opioids include: Codeine, Hydrocodone(Norco, Vicodin), Oxycodone(Percocet, oxycontin) and hydromorphone amongst others.  Long term and even short term use of opiods can cause: Increased pain response Dependence Constipation Depression Respiratory depression And more.  Withdrawal symptoms can include Flu like symptoms Nausea, vomiting And more Techniques to manage these symptoms Hydrate well Eat regular healthy meals Stay active Use relaxation techniques(deep breathing, meditating, yoga) Do Not substitute Alcohol to help with tapering If you have been on opioids for less than two weeks and do not have pain than it is ok to stop all together.  Plan to wean off of opioids This plan should start within one week post op of your joint replacement. Maintain the same interval or time between taking each dose and first decrease the dose.  Cut the total daily intake of opioids by one tablet each day Next start to increase the time between doses. The last dose that should be eliminated is the evening dose.   MAKE SURE YOU:  Understand these instructions.  Get help right away if you are not doing well or get worse.    Thank you for letting us be a part of your medical care team.  It is a privilege we respect greatly.  We hope these instructions will help you stay on track for a fast and full recovery!    Information on my medicine - XARELTO (Rivaroxaban)    Why was  Xarelto prescribed for you? Xarelto was prescribed for you to reduce the risk of blood clots forming  after orthopedic surgery. The medical term for these abnormal blood clots is venous thromboembolism (VTE).  What do you need to know about xarelto ? Take your Xarelto ONCE DAILY at the same time every day. You may take it either with or without food.  If you have difficulty swallowing the tablet whole, you may crush it and mix in applesauce just prior to taking your dose.  Take Xarelto exactly as prescribed by your doctor and DO NOT stop taking Xarelto without talking to the doctor who prescribed the medication.  Stopping without other VTE prevention medication to take the place of Xarelto may increase your risk of developing a clot.  After discharge, you should have regular check-up appointments with your healthcare provider that is prescribing your Xarelto.    What do you do if you miss a dose? If you miss a dose, take it as soon as you remember on the same day then continue your regularly scheduled once daily regimen the next day. Do not take two doses of Xarelto on the same day.   Important Safety Information A possible side effect of Xarelto is bleeding. You should call your healthcare provider right away if you experience any of the following: Bleeding from an injury or your nose that does not stop. Unusual colored urine (red or dark brown) or unusual colored stools (red or black). Unusual bruising for unknown reasons. A serious fall or if you hit your head (even if there is no bleeding).  Some medicines may interact with Xarelto and might increase your risk of bleeding while on Xarelto. To help avoid this, consult your healthcare provider or pharmacist prior to using any new prescription or non-prescription medications, including herbals, vitamins, non-steroidal anti-inflammatory drugs (NSAIDs) and supplements.  This website has more information on Xarelto:  [email or web address].

## 2023-01-28 NOTE — Anesthesia Procedure Notes (Signed)
Anesthesia Regional Block: Adductor canal block   Pre-Anesthetic Checklist: , timeout performed,  Correct Patient, Correct Site, Correct Laterality,  Correct Procedure, Correct Position, site marked,  Risks and benefits discussed,  Surgical consent,  Pre-op evaluation,  At surgeon's request and post-op pain management  Laterality: Right  Prep: chloraprep       Needles:  Injection technique: Single-shot  Needle Type: Echogenic Stimulator Needle     Needle Length: 9cm  Needle Gauge: 21     Additional Needles:   Procedures:,,,, ultrasound used (permanent image in chart),,    Narrative:  Start time: 01/28/2023 6:50 AM End time: 01/28/2023 6:55 AM Injection made incrementally with aspirations every 5 mL.  Performed by: Personally  Anesthesiologist: Shelton Silvas, MD  Additional Notes: Discussed risks and benefits of the nerve block in detail, including but not limited vascular injury, permanent nerve damage and infection.   Patient tolerated the procedure well. Local anesthetic introduced in an incremental fashion under minimal resistance after negative aspirations. No paresthesias were elicited. After completion of the procedure, no acute issues were identified and patient continued to be monitored by RN.

## 2023-01-29 ENCOUNTER — Encounter (HOSPITAL_COMMUNITY): Payer: Self-pay | Admitting: Orthopedic Surgery

## 2023-01-29 ENCOUNTER — Other Ambulatory Visit (HOSPITAL_COMMUNITY): Payer: Self-pay

## 2023-01-29 DIAGNOSIS — Z7952 Long term (current) use of systemic steroids: Secondary | ICD-10-CM | POA: Diagnosis not present

## 2023-01-29 DIAGNOSIS — I10 Essential (primary) hypertension: Secondary | ICD-10-CM | POA: Diagnosis not present

## 2023-01-29 DIAGNOSIS — Z8616 Personal history of COVID-19: Secondary | ICD-10-CM | POA: Diagnosis not present

## 2023-01-29 DIAGNOSIS — Z79899 Other long term (current) drug therapy: Secondary | ICD-10-CM | POA: Diagnosis not present

## 2023-01-29 DIAGNOSIS — M1711 Unilateral primary osteoarthritis, right knee: Secondary | ICD-10-CM | POA: Diagnosis not present

## 2023-01-29 DIAGNOSIS — Z96642 Presence of left artificial hip joint: Secondary | ICD-10-CM | POA: Diagnosis not present

## 2023-01-29 LAB — BASIC METABOLIC PANEL
Anion gap: 8 (ref 5–15)
BUN: 11 mg/dL (ref 8–23)
CO2: 23 mmol/L (ref 22–32)
Calcium: 8.6 mg/dL — ABNORMAL LOW (ref 8.9–10.3)
Chloride: 103 mmol/L (ref 98–111)
Creatinine, Ser: 0.57 mg/dL (ref 0.44–1.00)
GFR, Estimated: >60 mL/min (ref >60)
Glucose, Bld: 98 mg/dL (ref 70–99)
Potassium: 3.3 mmol/L — ABNORMAL LOW (ref 3.5–5.1)
Sodium: 134 mmol/L — ABNORMAL LOW (ref 135–145)

## 2023-01-29 LAB — CBC
HCT: 32.9 % — ABNORMAL LOW (ref 36.0–46.0)
Hemoglobin: 10.5 g/dL — ABNORMAL LOW (ref 12.0–15.0)
MCH: 31.7 pg (ref 26.0–34.0)
MCHC: 31.9 g/dL (ref 30.0–36.0)
MCV: 99.4 fL (ref 80.0–100.0)
Platelets: 255 10*3/uL (ref 150–400)
RBC: 3.31 MIL/uL — ABNORMAL LOW (ref 3.87–5.11)
RDW: 12.2 % (ref 11.5–15.5)
WBC: 7.1 10*3/uL (ref 4.0–10.5)
nRBC: 0 % (ref 0.0–0.2)

## 2023-01-29 MED ORDER — POTASSIUM CHLORIDE CRYS ER 20 MEQ PO TBCR
40.0000 meq | EXTENDED_RELEASE_TABLET | Freq: Once | ORAL | Status: AC
  Administered 2023-01-29: 40 meq via ORAL
  Filled 2023-01-29: qty 2

## 2023-01-29 MED ORDER — TIZANIDINE HCL 4 MG PO TABS
4.0000 mg | ORAL_TABLET | Freq: Three times a day (TID) | ORAL | Status: DC | PRN
  Administered 2023-01-29 – 2023-01-31 (×5): 4 mg via ORAL
  Filled 2023-01-29 (×6): qty 1

## 2023-01-29 MED ORDER — PROMETHAZINE HCL 25 MG PO TABS
25.0000 mg | ORAL_TABLET | Freq: Four times a day (QID) | ORAL | Status: DC | PRN
  Administered 2023-01-29: 25 mg via ORAL
  Filled 2023-01-29: qty 1

## 2023-01-29 MED ORDER — POLYETHYLENE GLYCOL 3350 17 G PO PACK
17.0000 g | PACK | Freq: Two times a day (BID) | ORAL | 0 refills | Status: DC
  Filled 2023-01-29: qty 14, 7d supply, fill #0

## 2023-01-29 MED ORDER — RIVAROXABAN 10 MG PO TABS
10.0000 mg | ORAL_TABLET | Freq: Every day | ORAL | 0 refills | Status: DC
  Filled 2023-01-29: qty 14, 14d supply, fill #0

## 2023-01-29 MED ORDER — TRANEXAMIC ACID 650 MG PO TABS
1950.0000 mg | ORAL_TABLET | Freq: Every day | ORAL | 0 refills | Status: DC
  Filled 2023-01-29: qty 6, 2d supply, fill #0

## 2023-01-29 MED ORDER — TIZANIDINE HCL 4 MG PO TABS
4.0000 mg | ORAL_TABLET | Freq: Three times a day (TID) | ORAL | 2 refills | Status: DC | PRN
  Filled 2023-01-29: qty 30, 10d supply, fill #0

## 2023-01-29 MED ORDER — SENNA 8.6 MG PO TABS
2.0000 | ORAL_TABLET | Freq: Every day | ORAL | 0 refills | Status: AC
  Filled 2023-01-29: qty 28, 14d supply, fill #0

## 2023-01-29 MED ORDER — HYDROMORPHONE HCL 2 MG PO TABS
2.0000 mg | ORAL_TABLET | ORAL | 0 refills | Status: DC | PRN
  Filled 2023-01-29: qty 42, 7d supply, fill #0

## 2023-01-29 MED ORDER — HYDROMORPHONE HCL 2 MG PO TABS
3.0000 mg | ORAL_TABLET | ORAL | Status: DC | PRN
  Administered 2023-01-29: 4 mg via ORAL
  Administered 2023-01-29: 3 mg via ORAL
  Administered 2023-01-29 – 2023-01-31 (×8): 4 mg via ORAL
  Filled 2023-01-29 (×12): qty 2

## 2023-01-29 NOTE — Progress Notes (Signed)
Subjective: 1 Day Post-Op Procedure(s) (LRB): TOTAL KNEE ARTHROPLASTY (Right) Patient reports pain as mild.   Patient seen in rounds with Dr. Charlann Boxer. Patient is well, and has had no acute complaints or problems. No acute events overnight. Foley catheter removed. Patient ambulated 6 feet with PT.  We will start therapy today.   Objective: Vital signs in last 24 hours: Temp:  [97.5 F (36.4 C)-98.5 F (36.9 C)] 97.7 F (36.5 C) (05/01 0711) Pulse Rate:  [54-74] 62 (05/01 0711) Resp:  [15-23] 16 (05/01 0711) BP: (110-133)/(63-82) 122/69 (05/01 0711) SpO2:  [96 %-100 %] 96 % (05/01 0711)  Intake/Output from previous day:  Intake/Output Summary (Last 24 hours) at 01/29/2023 0754 Last data filed at 01/29/2023 0600 Gross per 24 hour  Intake 2587.01 ml  Output 1950 ml  Net 637.01 ml     Intake/Output this shift: No intake/output data recorded.  Labs: Recent Labs    01/29/23 0356  HGB 10.5*   Recent Labs    01/29/23 0356  WBC 7.1  RBC 3.31*  HCT 32.9*  PLT 255   Recent Labs    01/29/23 0356  NA 134*  K 3.3*  CL 103  CO2 23  BUN 11  CREATININE 0.57  GLUCOSE 98  CALCIUM 8.6*   No results for input(s): "LABPT", "INR" in the last 72 hours.  Exam: General - Patient is Alert and Oriented Extremity - Neurologically intact Sensation intact distally Intact pulses distally Dorsiflexion/Plantar flexion intact Dressing - dressing C/D/I Motor Function - intact, moving foot and toes well on exam.   Past Medical History:  Diagnosis Date   Abdominal pain, other specified site 03/31/2008   Centricity Description: FLANK PAIN, RIGHT Qualifier: Diagnosis of  By: Alwyn Ren MD, William   Centricity Description: ABDOMINAL PAIN OTHER SPECIFIED SITE Qualifier: Diagnosis of  By: Janit Bern     Acute sprain or strain of cervical region 04/10/2012   ALLERGIC RHINITIS 06/26/2007   Qualifier: Diagnosis of  By: Charlsie Quest RMA, Lucy     ANKYLOSING SPONDYLITIS 03/31/2008   Qualifier:  Diagnosis of  By: Alwyn Ren MD, William     Ankylosing spondylitis (HCC)    neck and spin   Ankylosing spondylitis of multiple sites in spine [facility])    Arthritis    Back pain    Bariatric surgery status 01/14/2008   Qualifier: Diagnosis of  By: Laury Axon DO, Myrene Buddy     Blood in stool 12/02/2008   Qualifier: Diagnosis of  By: Janit Bern     Blood transfusion without reported diagnosis    Calf pain 10/13/2012   CHEST PAIN UNSPECIFIED 10/11/2009   Qualifier: Diagnosis of  By: Arlyce Dice MD, Barbette Hair    CONSTIPATION 04/06/2010   Qualifier: Diagnosis of  By: Janit Bern     CORNEAL ABRASION, LEFT 01/22/2010   Qualifier: Diagnosis of  By: Janit Bern     Gastritis    GERD 06/26/2007   Qualifier: Diagnosis of  By: Charlsie Quest RMA, Lucy     GERD (gastroesophageal reflux disease)    Hiatal hernia    Hyperlipidemia    Hypertension    no medications now   IBS 06/26/2007   Qualifier: Diagnosis of  By: Charlsie Quest RMA, Lucy     Joint pain    Lactose intolerance    LEG CRAMPS, NOCTURNAL 11/14/2010   Qualifier: Diagnosis of  By: Floydene Flock     Long-term current use of steroids 04/20/2013   LYMPHADENOPATHY 06/10/2007   Qualifier: Diagnosis  of  By: Janit Bern     MORBID OBESITY 06/26/2007   Qualifier: Diagnosis of  By: Samara Snide     MUSCLE PAIN 03/31/2008   Qualifier: Diagnosis of  By: Alwyn Ren MD, William     Noninfectious gastroenteritis and colitis 03/17/2013   Obesity (BMI 30-39.9) 03/16/2014   OSA (obstructive sleep apnea) 03/08/2016   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/23/2010   Qualifier: Diagnosis of  By: Janit Bern     Rheumatoid arthritis (HCC)    Seasonal allergies 05/14/2015   THYROID CYST 06/26/2007   Qualifier: Diagnosis of  By: Charlsie Quest RMA, Lucy     THYROID NODULE, HX OF 06/10/2007   Qualifier: Diagnosis of  By: Janit Bern     Vitamin D deficiency     Assessment/Plan: 1 Day Post-Op Procedure(s) (LRB): TOTAL KNEE ARTHROPLASTY  (Right) Principal Problem:   S/P total knee arthroplasty, right  Estimated body mass index is 33.87 kg/m as calculated from the following:   Height as of this encounter: 5\' 2"  (1.575 m).   Weight as of this encounter: 84 kg. Advance diet Up with therapy D/C IV fluids   Patient's anticipated LOS is less than 2 midnights, meeting these requirements: - Younger than 50 - Lives within 1 hour of care - Has a competent adult at home to recover with post-op recover - NO history of  - Chronic pain requiring opiods  - Diabetes  - Coronary Artery Disease  - Heart failure  - Heart attack  - Stroke  - DVT/VTE  - Cardiac arrhythmia  - Respiratory Failure/COPD  - Renal failure  - Anemia  - Advanced Liver disease     DVT Prophylaxis - Xarelto Weight bearing as tolerated.  Hgb stable at 10.5 this AM K 3.3 > will give one dose of kdur   Plan is to go Home after hospital stay. Plan for discharge today following 1-2 sessions of PT as long as they are meeting their goals. Patient is scheduled for OPPT. Follow up in the office in 2 weeks.   Dennie Bible, PA-C Orthopedic Surgery [phone] 01/29/2023, 7:54 AM

## 2023-01-29 NOTE — Progress Notes (Signed)
Physical Therapy Treatment Patient Details Name: [name] MRN: [id number] DOB: November 11, 1955 Today's Date: 01/29/2023   History of Present Illness 67 yo female presents to therapy s/p R TKA on 01/28/2023 due to failure of conservative measures. Pt has PMH including but is not limited to: DOE, gastric bypass, L THA (2019) and revision, OSA, ankylosing spondylitis, HTN, IBS, and ASCVD.    PT Comments    Pt limited by nausea/vomiting. Pt needing min A to bring RLE to EOB. Upon powering up to stand with min A, pt becomes nauseas and starts vomiting, returns to sitting. Pt reports feeling better, powering to stand with min guard and able to take steps to doorway with no knee buckling noted, slow short step to steps with decreased RLE weight-bearing. Pt then becomes nauseas again and needs to sit back down. Pt remains up in recliner with all needs in reach; RN notified of nausea/vomiting during session. Pt has 3 steps to enter the home then must ascend flight of steps to bedroom/bathroom, pt is a high fall risk, limited by pain and nausea/vomiting. Will progress PT as able with hopes to d/c home with family support.  Recommendations for follow up therapy are one component of a multi-disciplinary discharge planning process, led by the attending physician.  Recommendations may be updated based on patient status, additional functional criteria and insurance authorization.  Follow Up Recommendations       Assistance Recommended at Discharge Intermittent Supervision/Assistance  Patient can return home with the following A little help with walking and/or transfers;A little help with bathing/dressing/bathroom;Assistance with cooking/housework;Assist for transportation;Help with stairs or ramp for entrance   Equipment Recommendations  Rolling walker (2 wheels)    Recommendations for Other Services       Precautions / Restrictions Precautions Precautions: Knee;Fall Restrictions Weight Bearing  Restrictions: No     Mobility  Bed Mobility Overal bed mobility: Needs Assistance Bed Mobility: Supine to Sit  Supine to sit: Min assist  General bed mobility comments: HOB elevated with cues for hand placement, therapist assisting wtih RLE to come to EOB    Transfers Overall transfer level: Needs assistance Equipment used: Rolling walker (2 wheels) Transfers: Sit to/from Stand Sit to Stand: Min assist, Min guard  General transfer comment: min A to steady and assist with weight shift anterior with powering up to stand, pt became nauseas and began vomitting so return to sitting; pt needing min G to power up with 2nd rise to stand from EOB after nausea passed    Ambulation/Gait Ambulation/Gait assistance: Min guard Gait Distance (Feet): 8 Feet Assistive device: Rolling walker (2 wheels) Gait Pattern/deviations: Step-to pattern, Antalgic, Trunk flexed, Decreased stance time - right, Decreased weight shift to right Gait velocity: decreased  General Gait Details: short, step to steps wtih RW, decreased RLE weight-bearing and stance time, gait limited by nausea and vomitting, pt needing to return to sitting due to 2nd bout of nausea   Stairs             Wheelchair Mobility    Modified Rankin (Stroke Patients Only)       Balance Overall balance assessment: Needs assistance Sitting-balance support: Feet supported Sitting balance-Leahy Scale: Good  Standing balance support: During functional activity, Reliant on assistive device for balance, Bilateral upper extremity supported Standing balance-Leahy Scale: Poor         Cognition Arousal/Alertness: Awake/alert Behavior During Therapy: WFL for tasks assessed/performed Overall Cognitive Status: Within Functional Limits for tasks assessed     Exercises  General Comments General comments (skin integrity, edema, etc.): SpO2 100% and HR 70s-80s on RA      Pertinent Vitals/Pain Pain Assessment Pain Assessment:  Faces Faces Pain Scale: Hurts little more Pain Location: R knee Pain Descriptors / Indicators: Grimacing, Discomfort, Guarding, Tender, Sore, Burning Pain Intervention(s): Limited activity within patient's tolerance, Monitored during session, Premedicated before session, Repositioned, Ice applied    Home Living                          Prior Function            PT Goals (current goals can now be found in the care plan section) Acute Rehab PT Goals Patient Stated Goal: walk vagas without pain PT Goal Formulation: With patient Time For Goal Achievement: 02/10/23 Potential to Achieve Goals: Good Progress towards PT goals: Progressing toward goals    Frequency    7X/week      PT Plan Current plan remains appropriate    Co-evaluation              AM-PAC PT "6 Clicks" Mobility   Outcome Measure  Help needed turning from your back to your side while in a flat bed without using bedrails?: A Little Help needed moving from lying on your back to sitting on the side of a flat bed without using bedrails?: A Little Help needed moving to and from a bed to a chair (including a wheelchair)?: A Little Help needed standing up from a chair using your arms (e.g., wheelchair or bedside chair)?: A Little Help needed to walk in hospital room?: A Little Help needed climbing 3-5 steps with a railing? : Total 6 Click Score: 16    End of Session Equipment Utilized During Treatment: Gait belt Activity Tolerance: Treatment limited secondary to medical complications (Comment);Patient limited by pain (nausea/vomiting) Patient left: in chair;with call bell/phone within reach;with family/visitor present Nurse Communication: Mobility status (nausea/vomiting) PT Visit Diagnosis: Unsteadiness on feet (R26.81);Other abnormalities of gait and mobility (R26.89);Muscle weakness (generalized) (M62.81);Difficulty in walking, not elsewhere classified (R26.2);Pain Pain - Right/Left: Right Pain  - part of body: Knee     Time: 7829-5621 PT Time Calculation (min) (ACUTE ONLY): 33 min  Charges:  $Therapeutic Activity: 23-37 mins                      Tori Theo Krumholz PT, DPT 01/29/23, 12:36 PM

## 2023-01-29 NOTE — Progress Notes (Signed)
Pt refused cpap tonight.  Pt encouraged to call should she change her mind.  RN aware.

## 2023-01-29 NOTE — TOC Transition Note (Signed)
Transition of Care [facility]) - CM/SW Discharge Note   Patient Details  Name: [name] MRN: [id number] Date of Birth: May 23, 1956  Transition of Care [facility]) CM/SW Contact:  Amada Jupiter, LCSW Phone Number: 01/29/2023, 10:35 AM   Clinical Narrative:     Met with pt and spouse and confirming she does need a RW - no DME agency preference - order placed with [employer] for delivery to room.  OPPT already arranged with [facility].  No further TOC needs.  Final next level of care: OP Rehab Barriers to Discharge: No Barriers Identified   Patient Goals and CMS Choice      Discharge Placement                         Discharge Plan and Services Additional resources added to the After Visit Summary for                  DME Arranged: Walker rolling DME Agency: [employer] Date DME Agency Contacted: 01/29/23 Time DME Agency Contacted: 0930 Representative spoke with at DME Agency: Yvonna Alanis            Social Determinants of Health (SDOH) Interventions SDOH Screenings   Food Insecurity: No Food Insecurity (01/28/2023)  Housing: Low Risk  (01/28/2023)  Transportation Needs: No Transportation Needs (01/28/2023)  Utilities: Not At Risk (01/28/2023)  Alcohol Screen: Low Risk  (06/17/2022)  Depression (PHQ2-9): Low Risk  (10/01/2022)  Financial Resource Strain: Low Risk  (05/01/2021)  Physical Activity: Inactive (06/17/2022)  Social Connections: Moderately Integrated (06/17/2022)  Stress: No Stress Concern Present (06/17/2022)  Tobacco Use: Low Risk  (01/29/2023)     Readmission Risk Interventions     No data to display

## 2023-01-29 NOTE — Progress Notes (Addendum)
Physical Therapy Treatment Patient Details Name: [name] MRN: [id number] DOB: December 27, 1955 Today's Date: 01/29/2023   History of Present Illness 67 yo female presents to therapy s/p R TKA on 01/28/2023 due to failure of conservative measures. Pt has PMH including but is not limited to: DOE, gastric bypass, L THA (2019) and revision, OSA, ankylosing spondylitis, HTN, IBS, and ASCVD.    PT Comments    Pt hopeful to mobilize, reports getting up to [facility] with nursing twice. Pt needing min A to bring RLE to EOB. Pt pushing up to stand with min G, prefers to push up from [employer] with spouse steadying RW. Pt with step to gait pattern, decreased RLE weight-bearing and quick L foot step with minimal to shuffling step progression. Pt amb 50 ft with RW, with increasing nausea requesting to sit; rolled pt back to room in recliner. Assisted pt back to supine with all needs in reach; notified RN of nausea medication and pain medication request. Pt is a high fall risk, unable to stair train this session due to nausea, pt must climb to 2nd floor for bed/bath, plan to progress mobility as able and complete stair training prior to d/c home with spouse support.    Recommendations for follow up therapy are one component of a multi-disciplinary discharge planning process, led by the attending physician.  Recommendations may be updated based on patient status, additional functional criteria and insurance authorization.  Follow Up Recommendations       Assistance Recommended at Discharge Intermittent Supervision/Assistance  Patient can return home with the following A little help with walking and/or transfers;A little help with bathing/dressing/bathroom;Assistance with cooking/housework;Assist for transportation;Help with stairs or ramp for entrance   Equipment Recommendations  Rolling walker (2 wheels)    Recommendations for Other Services       Precautions / Restrictions Precautions Precautions:  Knee;Fall Restrictions Weight Bearing Restrictions: No     Mobility  Bed Mobility Overal bed mobility: Needs Assistance Bed Mobility: Supine to Sit, Sit to Supine  Supine to sit: Min assist Sit to supine: Min assist  General bed mobility comments: min A supine<>sit to assist RLE    Transfers Overall transfer level: Needs assistance Equipment used: Rolling walker (2 wheels) Transfers: Sit to/from Stand Sit to Stand: Min guard  Step pivot transfers: Min guard  General transfer comment: min G to power up to standing, cues for hand placement, min G for step pivot to BSC, good steadiness, therapist physically assisting RLE to slide forward to power up and return to sitting to minimize knee flexion for pt comfort    Ambulation/Gait Ambulation/Gait assistance: Min guard Gait Distance (Feet): 8 Feet Assistive device: Rolling walker (2 wheels) Gait Pattern/deviations: Step-to pattern, Antalgic, Trunk flexed, Decreased stance time - right, Decreased weight shift to right Gait velocity: decreased  General Gait Details: short, step to steps wtih RW, decreased RLE weight-bearing and stance time, gait limited by nausea and vomitting, pt needing to return to sitting due to 2nd bout of nausea   Stairs      Wheelchair Mobility    Modified Rankin (Stroke Patients Only)       Balance Overall balance assessment: Needs assistance Sitting-balance support: Feet supported Sitting balance-Leahy Scale: Good  Standing balance support: During functional activity, Reliant on assistive device for balance, Bilateral upper extremity supported Standing balance-Leahy Scale: Poor     Cognition Arousal/Alertness: Awake/alert Behavior During Therapy: WFL for tasks assessed/performed Overall Cognitive Status: Within Functional Limits for tasks assessed  Exercises      General Comments General comments (skin integrity, edema, etc.): SpO2 100% and HR 70s-80s on RA      Pertinent  Vitals/Pain Pain Assessment Pain Assessment: Faces Faces Pain Scale: Hurts even more Pain Location: R knee Pain Descriptors / Indicators: Grimacing, Discomfort, Guarding, Tender, Sore, Aching Pain Intervention(s): Limited activity within patient's tolerance, Monitored during session, Premedicated before session, Repositioned (pt denies ice)    Home Living                          Prior Function            PT Goals (current goals can now be found in the care plan section) Acute Rehab PT Goals Patient Stated Goal: walk vagas without pain PT Goal Formulation: With patient Time For Goal Achievement: 02/10/23 Potential to Achieve Goals: Good Progress towards PT goals: Progressing toward goals    Frequency    7X/week      PT Plan Current plan remains appropriate    Co-evaluation              AM-PAC PT "6 Clicks" Mobility   Outcome Measure  Help needed turning from your back to your side while in a flat bed without using bedrails?: A Little Help needed moving from lying on your back to sitting on the side of a flat bed without using bedrails?: A Little Help needed moving to and from a bed to a chair (including a wheelchair)?: A Little Help needed standing up from a chair using your arms (e.g., wheelchair or bedside chair)?: A Little Help needed to walk in hospital room?: A Little Help needed climbing 3-5 steps with a railing? : Total 6 Click Score: 16    End of Session Equipment Utilized During Treatment: Gait belt Activity Tolerance: Treatment limited secondary to medical complications (Comment);Patient limited by pain (nausea/vomiting) Patient left: in bed;with call bell/phone within reach;with bed alarm set;with family/visitor present Nurse Communication: Mobility status;Patient requests pain meds (nausea/vomiting, wanting pain medication and nausea medication) PT Visit Diagnosis: Unsteadiness on feet (R26.81);Other abnormalities of gait and mobility  (R26.89);Muscle weakness (generalized) (M62.81);Difficulty in walking, not elsewhere classified (R26.2);Pain Pain - Right/Left: Right Pain - part of body: Knee     Time: 1457-1533 PT Time Calculation (min) (ACUTE ONLY): 36 min  Charges:  $Gait Training: 8-22 mins $Therapeutic Activity: 8-22 mins                      Tori Shedrick Sarli PT, DPT 01/29/23, 3:53 PM

## 2023-01-30 ENCOUNTER — Other Ambulatory Visit (HOSPITAL_COMMUNITY): Payer: Self-pay

## 2023-01-30 DIAGNOSIS — Z96642 Presence of left artificial hip joint: Secondary | ICD-10-CM | POA: Diagnosis not present

## 2023-01-30 DIAGNOSIS — Z79899 Other long term (current) drug therapy: Secondary | ICD-10-CM | POA: Diagnosis not present

## 2023-01-30 DIAGNOSIS — Z7952 Long term (current) use of systemic steroids: Secondary | ICD-10-CM | POA: Diagnosis not present

## 2023-01-30 DIAGNOSIS — Z8616 Personal history of COVID-19: Secondary | ICD-10-CM | POA: Diagnosis not present

## 2023-01-30 DIAGNOSIS — M1711 Unilateral primary osteoarthritis, right knee: Secondary | ICD-10-CM | POA: Diagnosis not present

## 2023-01-30 DIAGNOSIS — I10 Essential (primary) hypertension: Secondary | ICD-10-CM | POA: Diagnosis not present

## 2023-01-30 LAB — CBC
HCT: 32.8 % — ABNORMAL LOW (ref 36.0–46.0)
Hemoglobin: 10.5 g/dL — ABNORMAL LOW (ref 12.0–15.0)
MCH: 31.2 pg (ref 26.0–34.0)
MCHC: 32 g/dL (ref 30.0–36.0)
MCV: 97.3 fL (ref 80.0–100.0)
Platelets: 274 10*3/uL (ref 150–400)
RBC: 3.37 MIL/uL — ABNORMAL LOW (ref 3.87–5.11)
RDW: 12.3 % (ref 11.5–15.5)
WBC: 10.6 10*3/uL — ABNORMAL HIGH (ref 4.0–10.5)
nRBC: 0 % (ref 0.0–0.2)

## 2023-01-30 NOTE — Progress Notes (Signed)
Physical Therapy Treatment Patient Details Name: [name] MRN: [id number] DOB: 07-Jul-1956 Today's Date: 01/30/2023   History of Present Illness 67 yo female presents to therapy s/p R TKA on 01/28/2023 due to failure of conservative measures. Pt has PMH including but is not limited to: DOE, gastric bypass, L THA (2019) and revision, OSA, ankylosing spondylitis, HTN, IBS, and ASCVD.    PT Comments    POD #2 pm session Pt quick to sit EOB from supine to use the restroom. Assisted pt to transfer to standing, assisted to ambulate to bathroom where pt voided. Assisted pt to ambulate in hallway 40 ft with sitting rest break, then ambulated another 10 ft. Pt c/o nausea and pain as a 5/10. Assisted pt to transfer to following recliner and rolled back to room. Assisted pt to transfer to bed and completed ankle pumps, quad sets, hip ab/adduction, and short arc quads. Addressed all mobility questions, discussed appropriate activity, educated on use of ICE. Pt is progressing with mobility by tolerating ambulating better. Pt plans to go home tomorrow with OPPT.    Recommendations for follow up therapy are one component of a multi-disciplinary discharge planning process, led by the attending physician.  Recommendations may be updated based on patient status, additional functional criteria and insurance authorization.  Follow Up Recommendations       Assistance Recommended at Discharge Intermittent Supervision/Assistance  Patient can return home with the following A little help with walking and/or transfers;A little help with bathing/dressing/bathroom;Assistance with cooking/housework;Assist for transportation;Help with stairs or ramp for entrance   Equipment Recommendations  Rolling walker (2 wheels)    Recommendations for Other Services       Precautions / Restrictions Precautions Precautions: Knee;Fall Restrictions Weight Bearing Restrictions: No     Mobility  Bed Mobility Overal bed  mobility: Needs Assistance Bed Mobility: Supine to Sit     Supine to sit: Min assist Sit to supine: Min assist, Mod assist   General bed mobility comments: min A to assist RLE to EOB    Transfers Overall transfer level: Needs assistance Equipment used: Rolling walker (2 wheels) Transfers: Sit to/from Stand Sit to Stand: Supervision, Min assist, Min guard Stand pivot transfers: Min guard Step pivot transfers: Min guard       General transfer comment: supv to power up from EOB and recliner, good steadiness, pt able to slightly extend RLE for comfort with powering up and returning to sit    Ambulation/Gait Ambulation/Gait assistance: Min guard Gait Distance (Feet): 50 Feet Assistive device: Rolling walker (2 wheels) Gait Pattern/deviations: Step-to pattern, Antalgic, Decreased stance time - right, Decreased weight shift to right Gait velocity: decreased     General Gait Details: step-to steps, decreased RLE weightbearing and stance time, good steadiness without knee buckling or overt LOB   Stairs             Wheelchair Mobility    Modified Rankin (Stroke Patients Only)       Balance                                            Cognition Arousal/Alertness: Awake/alert Behavior During Therapy: WFL for tasks assessed/performed Overall Cognitive Status: Within Functional Limits for tasks assessed  Exercises Total Joint Exercises Ankle Circles/Pumps: AROM, Both, 10 reps Quad Sets: AROM, Both, 5 reps, Supine Short Arc Quad: AAROM, Right, 5 reps, Supine Heel Slides: AAROM, Right, 5 reps, Supine Hip ABduction/ADduction: AAROM, Right, 5 reps, Supine    General Comments        Pertinent Vitals/Pain Pain Assessment Pain Score: 5  Pain Location: R knee Pain Descriptors / Indicators: Grimacing, Discomfort, Guarding, Tender, Sore, Aching Pain Intervention(s): Limited activity within  patient's tolerance, Repositioned, Ice applied, Monitored during session, Premedicated before session    Home Living                          Prior Function            PT Goals (current goals can now be found in the care plan section) Acute Rehab PT Goals Patient Stated Goal: walk vagas without pain PT Goal Formulation: With patient Time For Goal Achievement: 02/10/23 Potential to Achieve Goals: Good Progress towards PT goals: Progressing toward goals    Frequency    7X/week      PT Plan Current plan remains appropriate    Co-evaluation              AM-PAC PT "6 Clicks" Mobility   Outcome Measure  Help needed turning from your back to your side while in a flat bed without using bedrails?: A Little Help needed moving from lying on your back to sitting on the side of a flat bed without using bedrails?: A Little Help needed moving to and from a bed to a chair (including a wheelchair)?: A Little Help needed standing up from a chair using your arms (e.g., wheelchair or bedside chair)?: A Little Help needed to walk in hospital room?: A Little Help needed climbing 3-5 steps with a railing? : Total 6 Click Score: 16    End of Session Equipment Utilized During Treatment: Gait belt Activity Tolerance: Treatment limited secondary to medical complications (Comment);Patient limited by pain Patient left: with call bell/phone within reach;with family/visitor present;in bed;with bed alarm set Nurse Communication: Mobility status PT Visit Diagnosis: Unsteadiness on feet (R26.81);Other abnormalities of gait and mobility (R26.89);Muscle weakness (generalized) (M62.81);Difficulty in walking, not elsewhere classified (R26.2);Pain Pain - Right/Left: Right Pain - part of body: Knee     Time: 1435-1500 PT Time Calculation (min) (ACUTE ONLY): 25 min  Charges:  $Gait Training: 8-22 mins $Therapeutic Activity: 8-22 mins                     Sharlene Motts, SPTA

## 2023-01-30 NOTE — Progress Notes (Signed)
Physical Therapy Treatment Patient Details Name: [name] MRN: [id number] DOB: [birth year] Today's Date: 01/30/2023   History of Present Illness 67 yo female presents to therapy s/p R TKA on 01/28/2023 due to failure of conservative measures. Pt has PMH including but is not limited to: DOE, gastric bypass, L THA (2019) and revision, OSA, ankylosing spondylitis, HTN, IBS, and ASCVD.    PT Comments    Pt amb 50 ft in hallway with RW, step-to gait pattern, no knee buckling noted, good steadiness, limited by some pain and fatiguing. Pt denies nausea and with improved gait so trialed stairs. Pt ascends step sideways with min A to power up, using R handrail. Pt appears fearful prior to descend, educated on sideways stepping and sequencing, pt needing mod A with spouse also present providing supv, both therapist and spouse encouraging pt as descending 3 steps. Pt becomes nauseas after stair training and begins vomiting. Pt reports chronic L hip weakness limiting stair mobility. Pt is a high fall risk, needing +2 for safety to navigate 3 steps and has 3 steps to enter the home and sleeps/bathes on 2nd level. Plan to continue stair training with hopes to d/c home with spouse support.   Recommendations for follow up therapy are one component of a multi-disciplinary discharge planning process, led by the attending physician.  Recommendations may be updated based on patient status, additional functional criteria and insurance authorization.  Follow Up Recommendations       Assistance Recommended at Discharge Intermittent Supervision/Assistance  Patient can return home with the following A little help with walking and/or transfers;A little help with bathing/dressing/bathroom;Assistance with cooking/housework;Assist for transportation;Help with stairs or ramp for entrance   Equipment Recommendations  Rolling walker (2 wheels)    Recommendations for Other Services       Precautions / Restrictions  Precautions Precautions: Knee;Fall Restrictions Weight Bearing Restrictions: No     Mobility  Bed Mobility Overal bed mobility: Needs Assistance Bed Mobility: Supine to Sit Supine to sit: Min assist  General bed mobility comments: min A to assist RLE to EOB    Transfers Overall transfer level: Needs assistance Equipment used: Rolling walker (2 wheels) Transfers: Sit to/from Stand Sit to Stand: Supervision  General transfer comment: supv to power up from EOB and recliner, good steadiness, pt able to slightly extend RLE for comfort with powering up and returning to sit    Ambulation/Gait Ambulation/Gait assistance: Min guard Gait Distance (Feet): 50 Feet Assistive device: Rolling walker (2 wheels) Gait Pattern/deviations: Step-to pattern, Antalgic, Decreased stance time - right, Decreased weight shift to right Gait velocity: decreased  General Gait Details: step-to steps, decreased RLE weightbearing and stance time, good steadiness without knee buckling or overt LOB   Stairs Stairs: Yes Stairs assistance: Mod assist, +2 safety/equipment Stair Management: One rail Right Number of Stairs: 3 General stair comments: pt ascends 3 steps with R handrail and sidestepping fashion, good sequencing, min A to power up staircase and mod A for controlled lowering, pt very fearful of falling, reports chronic L hip weakness limiting her, heavy encouragement and education to descend back to floor and return to recliner, spouse present offering support throughout   Wheelchair Mobility    Modified Rankin (Stroke Patients Only)       Balance Overall balance assessment: Needs assistance Sitting-balance support: Feet supported Sitting balance-Leahy Scale: Good  Standing balance support: During functional activity, Reliant on assistive device for balance, Bilateral upper extremity supported Standing balance-Leahy Scale: Poor  Cognition Arousal/Alertness: Awake/alert Behavior During  Therapy: WFL for tasks assessed/performed Overall Cognitive Status: Within Functional Limits for tasks assessed        Exercises      General Comments        Pertinent Vitals/Pain Pain Assessment Pain Assessment: 0-10 Pain Score: 6  Pain Location: R knee Pain Descriptors / Indicators: Grimacing, Discomfort, Guarding, Tender, Sore, Aching Pain Intervention(s): Limited activity within patient's tolerance, Monitored during session, Premedicated before session, Repositioned (pt declines ice)    Home Living                          Prior Function            PT Goals (current goals can now be found in the care plan section) Acute Rehab PT Goals Patient Stated Goal: walk vagas without pain PT Goal Formulation: With patient Time For Goal Achievement: 02/10/23 Potential to Achieve Goals: Good Progress towards PT goals: Progressing toward goals    Frequency    7X/week      PT Plan Current plan remains appropriate    Co-evaluation              AM-PAC PT "6 Clicks" Mobility   Outcome Measure  Help needed turning from your back to your side while in a flat bed without using bedrails?: A Little Help needed moving from lying on your back to sitting on the side of a flat bed without using bedrails?: A Little Help needed moving to and from a bed to a chair (including a wheelchair)?: A Little Help needed standing up from a chair using your arms (e.g., wheelchair or bedside chair)?: A Little Help needed to walk in hospital room?: A Little Help needed climbing 3-5 steps with a railing? : Total 6 Click Score: 16    End of Session Equipment Utilized During Treatment: Gait belt Activity Tolerance: Treatment limited secondary to medical complications (Comment);Patient limited by pain (nausea/vomiting) Patient left: in chair;with call bell/phone within reach;with chair alarm set;with family/visitor present Nurse Communication: Mobility status (nausea/vomiting) PT  Visit Diagnosis: Unsteadiness on feet (R26.81);Other abnormalities of gait and mobility (R26.89);Muscle weakness (generalized) (M62.81);Difficulty in walking, not elsewhere classified (R26.2);Pain Pain - Right/Left: Right Pain - part of body: Knee     Time: 1011-1040 PT Time Calculation (min) (ACUTE ONLY): 29 min  Charges:  $Gait Training: 23-37 mins                      Tori Lezette Kitts PT, DPT 01/30/23, 11:22 AM

## 2023-01-30 NOTE — Progress Notes (Signed)
Subjective: 2 Days Post-Op Procedure(s) (LRB): TOTAL KNEE ARTHROPLASTY (Right) Patient reports pain as mild.   Patient seen in rounds for Dr. Charlann Boxer. Patient is well, and has had no acute complaints or problems. She had nausea/vomiting yesterday that limited her with PT.  She was not able to complete stair training. She has been up to the bathroom this morning without nausea.  We will continue therapy today.   Objective: Vital signs in last 24 hours: Temp:  [98.6 F (37 C)-99.9 F (37.7 C)] 98.6 F (37 C) (05/02 0631) Pulse Rate:  [69-81] 81 (05/02 0631) Resp:  [16-18] 18 (05/02 0631) BP: (148-153)/(71-80) 152/73 (05/02 0631) SpO2:  [97 %-98 %] 97 % (05/02 0631)  Intake/Output from previous day:  Intake/Output Summary (Last 24 hours) at 01/30/2023 0734 Last data filed at 01/30/2023 0600 Gross per 24 hour  Intake 480 ml  Output 525 ml  Net -45 ml     Intake/Output this shift: No intake/output data recorded.  Labs: Recent Labs    01/29/23 0356 01/30/23 0330  HGB 10.5* 10.5*   Recent Labs    01/29/23 0356 01/30/23 0330  WBC 7.1 10.6*  RBC 3.31* 3.37*  HCT 32.9* 32.8*  PLT 255 274   Recent Labs    01/29/23 0356  NA 134*  K 3.3*  CL 103  CO2 23  BUN 11  CREATININE 0.57  GLUCOSE 98  CALCIUM 8.6*   No results for input(s): "LABPT", "INR" in the last 72 hours.  Exam: General - Patient is Alert and Oriented Extremity - Neurologically intact Sensation intact distally Intact pulses distally Dorsiflexion/Plantar flexion intact Dressing - dressing C/D/I Motor Function - intact, moving foot and toes well on exam.   Past Medical History:  Diagnosis Date   Abdominal pain, other specified site 03/31/2008   Centricity Description: FLANK PAIN, RIGHT Qualifier: Diagnosis of  By: Alwyn Ren MD, William   Centricity Description: ABDOMINAL PAIN OTHER SPECIFIED SITE Qualifier: Diagnosis of  By: Janit Bern     Acute sprain or strain of cervical region 04/10/2012    ALLERGIC RHINITIS 06/26/2007   Qualifier: Diagnosis of  By: Charlsie Quest RMA, Lucy     ANKYLOSING SPONDYLITIS 03/31/2008   Qualifier: Diagnosis of  By: Alwyn Ren MD, William     Ankylosing spondylitis (HCC)    neck and spin   Ankylosing spondylitis of multiple sites in spine [facility])    Arthritis    Back pain    Bariatric surgery status 01/14/2008   Qualifier: Diagnosis of  By: Laury Axon DO, Myrene Buddy     Blood in stool 12/02/2008   Qualifier: Diagnosis of  By: Janit Bern     Blood transfusion without reported diagnosis    Calf pain 10/13/2012   CHEST PAIN UNSPECIFIED 10/11/2009   Qualifier: Diagnosis of  By: Arlyce Dice MD, Barbette Hair    CONSTIPATION 04/06/2010   Qualifier: Diagnosis of  By: Janit Bern     CORNEAL ABRASION, LEFT 01/22/2010   Qualifier: Diagnosis of  By: Janit Bern     Gastritis    GERD 06/26/2007   Qualifier: Diagnosis of  By: Charlsie Quest RMA, Lucy     GERD (gastroesophageal reflux disease)    Hiatal hernia    Hyperlipidemia    Hypertension    no medications now   IBS 06/26/2007   Qualifier: Diagnosis of  By: Charlsie Quest RMA, Lucy     Joint pain    Lactose intolerance    LEG CRAMPS, NOCTURNAL 11/14/2010  Qualifier: Diagnosis of  By: Floydene Flock     Long-term current use of steroids 04/20/2013   LYMPHADENOPATHY 06/10/2007   Qualifier: Diagnosis of  By: Janit Bern     MORBID OBESITY 06/26/2007   Qualifier: Diagnosis of  By: Samara Snide     MUSCLE PAIN 03/31/2008   Qualifier: Diagnosis of  By: Alwyn Ren MD, William     Noninfectious gastroenteritis and colitis 03/17/2013   Obesity (BMI 30-39.9) 03/16/2014   OSA (obstructive sleep apnea) 03/08/2016   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/23/2010   Qualifier: Diagnosis of  By: Janit Bern     Rheumatoid arthritis (HCC)    Seasonal allergies 05/14/2015   THYROID CYST 06/26/2007   Qualifier: Diagnosis of  By: Charlsie Quest RMA, Lucy     THYROID NODULE, HX OF 06/10/2007   Qualifier: Diagnosis of  By: Janit Bern     Vitamin D deficiency     Assessment/Plan: 2 Days Post-Op Procedure(s) (LRB): TOTAL KNEE ARTHROPLASTY (Right) Principal Problem:   S/P total knee arthroplasty, right  Estimated body mass index is 33.87 kg/m as calculated from the following:   Height as of this encounter: 5\' 2"  (1.575 m).   Weight as of this encounter: 84 kg. Advance diet Up with therapy D/C IV fluids   Patient's anticipated LOS is less than 2 midnights, meeting these requirements: - Younger than 74 - Lives within 1 hour of care - Has a competent adult at home to recover with post-op recover - NO history of  - Chronic pain requiring opiods  - Diabetes  - Coronary Artery Disease  - Heart failure  - Heart attack  - Stroke  - DVT/VTE  - Cardiac arrhythmia  - Respiratory Failure/COPD  - Renal failure  - Anemia  - Advanced Liver disease     DVT Prophylaxis - Xarelto Weight bearing as tolerated.  Hgb stable at 10.5 this AM.  Plan is to go Home after hospital stay. Plan for discharge today following 1-2 sessions of PT as long as they are meeting their goals. Patient is scheduled for OPPT. Follow up in the office in 2 weeks.   Dennie Bible, PA-C Orthopedic Surgery [phone] 01/30/2023, 7:34 AM

## 2023-01-30 NOTE — Progress Notes (Signed)
Pt refusing cpap.

## 2023-01-31 ENCOUNTER — Other Ambulatory Visit (HOSPITAL_COMMUNITY): Payer: Self-pay

## 2023-01-31 ENCOUNTER — Ambulatory Visit: Payer: Medicare Other | Admitting: Gastroenterology

## 2023-01-31 DIAGNOSIS — Z7952 Long term (current) use of systemic steroids: Secondary | ICD-10-CM | POA: Diagnosis not present

## 2023-01-31 DIAGNOSIS — M1711 Unilateral primary osteoarthritis, right knee: Secondary | ICD-10-CM | POA: Diagnosis not present

## 2023-01-31 DIAGNOSIS — Z79899 Other long term (current) drug therapy: Secondary | ICD-10-CM | POA: Diagnosis not present

## 2023-01-31 DIAGNOSIS — Z96642 Presence of left artificial hip joint: Secondary | ICD-10-CM | POA: Diagnosis not present

## 2023-01-31 DIAGNOSIS — I10 Essential (primary) hypertension: Secondary | ICD-10-CM | POA: Diagnosis not present

## 2023-01-31 DIAGNOSIS — Z8616 Personal history of COVID-19: Secondary | ICD-10-CM | POA: Diagnosis not present

## 2023-01-31 MED ORDER — HYDROXYZINE HCL 10 MG PO TABS: 10.0000 mg | ORAL_TABLET | Freq: Three times a day (TID) | ORAL | 0 refills | Status: AC | PRN

## 2023-01-31 MED ORDER — TIZANIDINE HCL 4 MG PO TABS: 4.0000 mg | ORAL_TABLET | Freq: Three times a day (TID) | ORAL | 2 refills | Status: DC | PRN

## 2023-01-31 MED ORDER — ONDANSETRON HCL 4 MG PO TABS: 4.0000 mg | ORAL_TABLET | Freq: Four times a day (QID) | ORAL | 0 refills | Status: DC | PRN

## 2023-01-31 MED ORDER — OXYCODONE HCL 5 MG PO TABS
5.0000 mg | ORAL_TABLET | ORAL | Status: DC | PRN
  Administered 2023-01-31 (×2): 10 mg via ORAL
  Filled 2023-01-31 (×2): qty 2

## 2023-01-31 MED ORDER — RIVAROXABAN 10 MG PO TABS: 10.0000 mg | ORAL_TABLET | Freq: Every day | ORAL | 0 refills | Status: DC

## 2023-01-31 MED ORDER — OXYCODONE HCL 5 MG PO TABS: 5.0000 mg | ORAL_TABLET | ORAL | 0 refills | Status: DC | PRN

## 2023-01-31 MED ORDER — POLYETHYLENE GLYCOL 3350 17 G PO PACK: 17.0000 g | PACK | Freq: Two times a day (BID) | ORAL | 0 refills | Status: AC

## 2023-01-31 NOTE — Progress Notes (Signed)
Physical Therapy Treatment Patient Details Name: [name] MRN: [id number] DOB: 1956/03/01 Today's Date: 01/31/2023   History of Present Illness 67 yo female presents to therapy s/p R TKA on 01/28/2023 due to failure of conservative measures. Pt has PMH including but is not limited to: DOE, gastric bypass, L THA (2019) and revision, OSA, ankylosing spondylitis, HTN, IBS, and ASCVD.    PT Comments    POD # 3 am session Extended stay due to pain control and nausea. General Comments: AxO x 3 very supportive Spouse present during session. General bed mobility comments: pt self able to sit EOB using "other" LE to hook/guide off bed.   General transfer comment: had Spouse "hands on" asisst pt with all transfers including a toilet transfer under Therapist direction and use of safety belt.  Instructed on proper hand placement, safety with turns and proper walker to self distance. General Gait Details: Had Spouse "hands on" assist pt with all mobility using safety belt under direction from Therapist.  Instructed on proper walker to self distance and safety with turns.  Instructed to use walker "approx 2 week" minimum.  Distance limited to focus on stair training. General stair comments: Had pt use backward approach with walker due to "other" hip is "bad".  Had Spouse hands on asisst pt up backward while securing pt using safety belt as well as "other" hand and his hip against walker for optimal support.  Performed twice to ensure both pt and Spouse performed correctly.  Then returned to room to perform some TE's following HEP handout.  Instructed on proper tech, freq as well as use of ICE.   Addressed all mobility questions, discussed appropriate activity, educated on use of ICE.  Pt ready for D/C to home.   Recommendations for follow up therapy are one component of a multi-disciplinary discharge planning process, led by the attending physician.  Recommendations may be updated based on patient status,  additional functional criteria and insurance authorization.  Follow Up Recommendations       Assistance Recommended at Discharge Intermittent Supervision/Assistance  Patient can return home with the following A little help with walking and/or transfers;A little help with bathing/dressing/bathroom;Assistance with cooking/housework;Assist for transportation;Help with stairs or ramp for entrance   Equipment Recommendations  Rolling walker (2 wheels)    Recommendations for Other Services       Precautions / Restrictions Precautions Precautions: Knee;Fall Precaution Comments: no pillow under knee Restrictions Weight Bearing Restrictions: No     Mobility  Bed Mobility Overal bed mobility: Needs Assistance Bed Mobility: Supine to Sit     Supine to sit: Supervision, Min guard     General bed mobility comments: pt self able to sit EOB using "other" LE to hook/guide off bed    Transfers Overall transfer level: Needs assistance Equipment used: Rolling walker (2 wheels) Transfers: Sit to/from Stand Sit to Stand: Supervision, Min guard           General transfer comment: had Spouse "hands on" asisst pt with all transfers including a toilet transfer under Therapist direction and use of safety belt.  Instructed on proper hand placement, safety with turns and proper walker to self distance.    Ambulation/Gait Ambulation/Gait assistance: Supervision, Min guard Gait Distance (Feet): 24 Feet (12 feet x 2) Assistive device: Rolling walker (2 wheels) Gait Pattern/deviations: Step-to pattern, Antalgic, Decreased stance time - right, Decreased weight shift to right Gait velocity: decreased     General Gait Details: Had Spouse "hands on" assist pt  with all mobility using safety belt under direction from Therapist.  Instructed on proper walker to self distance and safety with turns.  Instructed to use walker "approx 2 week" minimum.  Distance limited to focus on stair  training.   Stairs Stairs: Yes Stairs assistance: Min assist Stair Management: No rails, Step to pattern, Backwards, With walker Number of Stairs: 4 General stair comments: Had pt use backward approach with walker due to "other" hip is "bad".  Had Spouse hands on asisst pt up backward while securing pt using safety belt as well as "other" hand and his hip against walker for optimal support.  Performed twice to ensure both pt and Spouse performed correctly.   Wheelchair Mobility    Modified Rankin (Stroke Patients Only)       Balance                                            Cognition Arousal/Alertness: Awake/alert Behavior During Therapy: WFL for tasks assessed/performed Overall Cognitive Status: Within Functional Limits for tasks assessed                                 General Comments: AxO x 3 very supportive Spouse present during session.        Exercises Total Joint Exercises Ankle Circles/Pumps: AROM, Both, 10 reps Quad Sets: AROM, Both, 5 reps, Supine Short Arc Quad: AAROM, Right, 5 reps, Supine Heel Slides: AAROM, Right, 5 reps, Supine Hip ABduction/ADduction: AAROM, Right, 5 reps, Supine Straight Leg Raises: AAROM, Right, 5 reps, Supine    General Comments        Pertinent Vitals/Pain Pain Assessment Pain Assessment: 0-10 Pain Score: 4  Pain Location: R knee Pain Descriptors / Indicators: Grimacing, Discomfort, Guarding, Tender, Sore, Aching Pain Intervention(s): Monitored during session, Repositioned, Ice applied    Home Living                          Prior Function            PT Goals (current goals can now be found in the care plan section) Progress towards PT goals: Progressing toward goals    Frequency    7X/week      PT Plan Current plan remains appropriate    Co-evaluation              AM-PAC PT "6 Clicks" Mobility   Outcome Measure  Help needed turning from your back to  your side while in a flat bed without using bedrails?: A Little Help needed moving from lying on your back to sitting on the side of a flat bed without using bedrails?: A Little Help needed moving to and from a bed to a chair (including a wheelchair)?: A Little Help needed standing up from a chair using your arms (e.g., wheelchair or bedside chair)?: A Little Help needed to walk in hospital room?: A Little Help needed climbing 3-5 steps with a railing? : A Little 6 Click Score: 18    End of Session Equipment Utilized During Treatment: Gait belt Activity Tolerance: Treatment limited secondary to medical complications (Comment);Patient limited by pain Patient left: with call bell/phone within reach;with family/visitor present;in bed;with bed alarm set Nurse Communication: Mobility status (pt ready for D/C to home) PT Visit Diagnosis: Unsteadiness  on feet (R26.81);Other abnormalities of gait and mobility (R26.89);Muscle weakness (generalized) (M62.81);Difficulty in walking, not elsewhere classified (R26.2);Pain Pain - Right/Left: Right Pain - part of body: Knee     Time: 0915-1000 PT Time Calculation (min) (ACUTE ONLY): 45 min  Charges:  $Gait Training: 8-22 mins $Therapeutic Exercise: 8-22 mins $Therapeutic Activity: 8-22 mins                     Felecia Shelling  PTA Acute  Rehabilitation Services Office M-F          [phone]

## 2023-01-31 NOTE — Progress Notes (Signed)
Subjective: 3 Days Post-Op Procedure(s) (LRB): TOTAL KNEE ARTHROPLASTY (Right) Patient reports pain as mild.   Patient seen in rounds with Dr. Charlann Boxer. Patient is well, and has had no acute complaints or problems. No acute events overnight. She has continued to get nauseous with ambulation, and we discussed changing pain meds. We also discussed OPPT, which we will move to Monday. She would like to go home today.  We will continue therapy today.   Objective: Vital signs in last 24 hours: Temp:  [98.7 F (37.1 C)-100.2 F (37.9 C)] 98.7 F (37.1 C) (05/03 0544) Pulse Rate:  [76-84] 80 (05/03 0544) Resp:  [15-17] 15 (05/03 0544) BP: (124-137)/(67-79) 130/67 (05/03 0544) SpO2:  [97 %-100 %] 99 % (05/03 0544)  Intake/Output from previous day:  Intake/Output Summary (Last 24 hours) at 01/31/2023 0737 Last data filed at 01/31/2023 0600 Gross per 24 hour  Intake 960 ml  Output --  Net 960 ml     Intake/Output this shift: No intake/output data recorded.  Labs: Recent Labs    01/29/23 0356 01/30/23 0330  HGB 10.5* 10.5*   Recent Labs    01/29/23 0356 01/30/23 0330  WBC 7.1 10.6*  RBC 3.31* 3.37*  HCT 32.9* 32.8*  PLT 255 274   Recent Labs    01/29/23 0356  NA 134*  K 3.3*  CL 103  CO2 23  BUN 11  CREATININE 0.57  GLUCOSE 98  CALCIUM 8.6*   No results for input(s): "LABPT", "INR" in the last 72 hours.  Exam: General - Patient is Alert and Oriented Extremity - Neurologically intact Sensation intact distally Intact pulses distally Dorsiflexion/Plantar flexion intact Dressing - dressing C/D/I Motor Function - intact, moving foot and toes well on exam.   Past Medical History:  Diagnosis Date   Abdominal pain, other specified site 03/31/2008   Centricity Description: FLANK PAIN, RIGHT Qualifier: Diagnosis of  By: Alwyn Ren MD, William   Centricity Description: ABDOMINAL PAIN OTHER SPECIFIED SITE Qualifier: Diagnosis of  By: Janit Bern     Acute sprain or  strain of cervical region 04/10/2012   ALLERGIC RHINITIS 06/26/2007   Qualifier: Diagnosis of  By: Charlsie Quest RMA, Lucy     ANKYLOSING SPONDYLITIS 03/31/2008   Qualifier: Diagnosis of  By: Alwyn Ren MD, William     Ankylosing spondylitis (HCC)    neck and spin   Ankylosing spondylitis of multiple sites in spine [facility])    Arthritis    Back pain    Bariatric surgery status 01/14/2008   Qualifier: Diagnosis of  By: Laury Axon DO, Myrene Buddy     Blood in stool 12/02/2008   Qualifier: Diagnosis of  By: Janit Bern     Blood transfusion without reported diagnosis    Calf pain 10/13/2012   CHEST PAIN UNSPECIFIED 10/11/2009   Qualifier: Diagnosis of  By: Arlyce Dice MD, Barbette Hair    CONSTIPATION 04/06/2010   Qualifier: Diagnosis of  By: Janit Bern     CORNEAL ABRASION, LEFT 01/22/2010   Qualifier: Diagnosis of  By: Janit Bern     Gastritis    GERD 06/26/2007   Qualifier: Diagnosis of  By: Charlsie Quest RMA, Lucy     GERD (gastroesophageal reflux disease)    Hiatal hernia    Hyperlipidemia    Hypertension    no medications now   IBS 06/26/2007   Qualifier: Diagnosis of  By: Charlsie Quest RMA, Lucy     Joint pain    Lactose intolerance    LEG  CRAMPS, NOCTURNAL 11/14/2010   Qualifier: Diagnosis of  By: Floydene Flock     Long-term current use of steroids 04/20/2013   LYMPHADENOPATHY 06/10/2007   Qualifier: Diagnosis of  By: Janit Bern     MORBID OBESITY 06/26/2007   Qualifier: Diagnosis of  By: Tyrone Apple, Lucy     MUSCLE PAIN 03/31/2008   Qualifier: Diagnosis of  By: Alwyn Ren MD, William     Noninfectious gastroenteritis and colitis 03/17/2013   Obesity (BMI 30-39.9) 03/16/2014   OSA (obstructive sleep apnea) 03/08/2016   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   Weight as of this encounter: 84 kg. Advance diet Up with therapy D/C IV fluids   DVT Prophylaxis - Xarelto Weight bearing as tolerated.  Will change to oxycodone, which she tolerated in the past with mild itching. I'll send hydroxyzine if she needs it for the itching.  Plan is to go Home after hospital stay. Plan for discharge today following 1-2 sessions of PT as long as they are meeting their goals. Patient is scheduled for OPPT. Follow up in the office in 2 weeks.   Dennie Bible, PA-C Orthopedic Surgery [phone] 01/31/2023, 7:37 AM

## 2023-02-03 DIAGNOSIS — M1711 Unilateral primary osteoarthritis, right knee: Secondary | ICD-10-CM | POA: Diagnosis not present

## 2023-02-03 NOTE — Discharge Summary (Signed)
Patient ID: [name] MRN: [id number] DOB/AGE: 1956/02/23 67 y.o.  Admit date: 01/28/2023 Discharge date: 01/31/2023  Admission Diagnoses:  Right knee osteoarthritis  Discharge Diagnoses:  Principal Problem:   S/P total knee arthroplasty, right   Past Medical History:  Diagnosis Date   Abdominal pain, other specified site 03/31/2008   Centricity Description: FLANK PAIN, RIGHT Qualifier: Diagnosis of  By: Alwyn Ren MD, William   Centricity Description: ABDOMINAL PAIN OTHER SPECIFIED SITE Qualifier: Diagnosis of  By: Janit Bern     Acute sprain or strain of cervical region 04/10/2012   ALLERGIC RHINITIS 06/26/2007   Qualifier: Diagnosis of  By: Charlsie Quest RMA, Lucy     ANKYLOSING SPONDYLITIS 03/31/2008   Qualifier: Diagnosis of  By: Alwyn Ren MD, William     Ankylosing spondylitis (HCC)    neck and spin   Ankylosing spondylitis of multiple sites in spine [facility])    Arthritis    Back pain    Bariatric surgery status 01/14/2008   Qualifier: Diagnosis of  By: Laury Axon DO, Myrene Buddy     Blood in stool 12/02/2008   Qualifier: Diagnosis of  By: Janit Bern     Blood transfusion without reported diagnosis    Calf pain 10/13/2012   CHEST PAIN UNSPECIFIED 10/11/2009   Qualifier: Diagnosis of  By: Arlyce Dice MD, Barbette Hair    CONSTIPATION 04/06/2010   Qualifier: Diagnosis of  By: Janit Bern     CORNEAL ABRASION, LEFT 01/22/2010   Qualifier: Diagnosis of  By: Janit Bern     Gastritis    GERD 06/26/2007   Qualifier: Diagnosis of  By: Charlsie Quest RMA, Lucy     GERD (gastroesophageal reflux disease)    Hiatal hernia    Hyperlipidemia    Hypertension    no medications now   IBS 06/26/2007   Qualifier: Diagnosis of  By: Charlsie Quest RMA, Lucy     Joint pain    Lactose intolerance    LEG CRAMPS, NOCTURNAL 11/14/2010   Qualifier: Diagnosis of  By: Floydene Flock     Long-term current use of steroids 04/20/2013   LYMPHADENOPATHY 06/10/2007   Qualifier: Diagnosis of  By: Janit Bern      MORBID OBESITY 06/26/2007   Qualifier: Diagnosis of  By: Samara Snide     MUSCLE PAIN 03/31/2008   Qualifier: Diagnosis of  By: Alwyn Ren MD, William     Noninfectious gastroenteritis and colitis 03/17/2013   Obesity (BMI 30-39.9) 03/16/2014   OSA (obstructive sleep apnea) 03/08/2016   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/23/2010   Qualifier: Diagnosis of  By: Janit Bern     Rheumatoid arthritis (HCC)    Seasonal allergies 05/14/2015   THYROID CYST 06/26/2007   Qualifier: Diagnosis of  By: Charlsie Quest RMA, Lucy     THYROID NODULE, HX OF 06/10/2007   Qualifier: Diagnosis of  By: Janit Bern     Vitamin D deficiency     Surgeries: Procedure(s): TOTAL KNEE ARTHROPLASTY on 01/28/2023   Consultants:   Discharged Condition: Improved  Hospital Course: LASHAUNE FREIMARK is an 67 y.o. female who was admitted 01/28/2023 for operative treatment ofS/P total knee arthroplasty, right. Patient has severe unremitting pain that affects sleep, daily activities, and work/hobbies. After pre-op clearance the patient was taken to the operating room on 01/28/2023 and underwent  Procedure(s): TOTAL KNEE ARTHROPLASTY.    Patient was given perioperative antibiotics:  Anti-infectives (From admission, onward)    Start  Dose/Rate Route Frequency Ordered Stop   01/28/23 1300  ceFAZolin (ANCEF) IVPB 2g/100 mL premix        2 g 200 mL/hr over 30 Minutes Intravenous Every 6 hours 01/28/23 1050 01/28/23 2120   01/28/23 0600  ceFAZolin (ANCEF) IVPB 2g/100 mL premix        2 g 200 mL/hr over 30 Minutes Intravenous On call to O.R. 01/28/23 0544 01/28/23 4782        Patient was given sequential compression devices, early ambulation, and chemoprophylaxis to prevent DVT. Patient worked with PT and was meeting their goals regarding safe ambulation and transfers after several days of acute inpatient PT.  Patient benefited maximally from hospital stay and there were no complications.    Recent vital  signs: No data found.   Recent laboratory studies: No results for input(s): "WBC", "HGB", "HCT", "PLT", "NA", "K", "CL", "CO2", "BUN", "CREATININE", "GLUCOSE", "INR", "CALCIUM" in the last 72 hours.  Invalid input(s): "PT", "2"   Discharge Medications:   Allergies as of 01/31/2023       Reactions   Aspirin    REACTION: intolerance-gi upset   Codeine Itching   Morphine Itching   Norvasc [amlodipine]    Sulfa Antibiotics Other (See Comments)   Unsure of reaction   Sulfonamide Derivatives    Unsure of reaction        Medication List     STOP taking these medications    amoxicillin-clavulanate 875-125 MG tablet Commonly known as: AUGMENTIN   fluticasone 50 MCG/ACT nasal spray Commonly known as: FLONASE   ibuprofen 200 MG tablet Commonly known as: ADVIL   rosuvastatin 10 MG tablet Commonly known as: Crestor       TAKE these medications    bariatric multiple vitamin Chew chewable tablet Chew 2 tablets by mouth daily.   CALTRATE 600+D3 SOFT PO Take 1 tablet by mouth daily.   chlorthalidone 25 MG tablet Commonly known as: HYGROTON Take 0.5 tablets (12.5 mg total) by mouth daily.   esomeprazole 40 MG capsule Commonly known as: NEXIUM Take 1 capsule (40 mg total) by mouth daily as needed. Crack open capsule and ingest contents. Please keep your January appointment for further refills. Thank you   HAIR SKIN & NAILS GUMMIES PO Take 1 tablet by mouth daily.   hydrOXYzine 10 MG tablet Commonly known as: ATARAX Take 1 tablet (10 mg total) by mouth 3 (three) times daily as needed for itching.   lisinopril 10 MG tablet Commonly known as: ZESTRIL Take 1 tablet (10 mg total) by mouth daily.   ondansetron 4 MG tablet Commonly known as: ZOFRAN Take 1 tablet (4 mg total) by mouth every 6 (six) hours as needed for nausea.   oxyCODONE 5 MG immediate release tablet Commonly known as: Oxy IR/ROXICODONE Take 1-2 tablets (5-10 mg total) by mouth every 4 (four) hours as  needed for severe pain.   polyethylene glycol 17 g packet Commonly known as: MIRALAX / GLYCOLAX Take 17 g by mouth 2 (two) times daily.   rivaroxaban 10 MG Tabs tablet Commonly known as: XARELTO Take 1 tablet (10 mg total) by mouth daily with breakfast for 14 days.   senna 8.6 MG Tabs tablet Commonly known as: SENOKOT Take 2 tablets (17.2 mg total) by mouth at bedtime for 14 days.   sucralfate 1 g tablet Commonly known as: CARAFATE Take 1 g by mouth 4 (four) times daily -  with meals and at bedtime.   tiZANidine 4 MG tablet Commonly known  as: ZANAFLEX Take 1 tablet (4 mg total) by mouth every 8 (eight) hours as needed for muscle spasms.               Discharge Care Instructions  (From admission, onward)           Start     Ordered   01/29/23 0000  Change dressing       Comments: Maintain surgical dressing until follow up in the clinic. If the edges start to pull up, may reinforce with tape. If the dressing is no longer working, may remove and cover with gauze and tape, but must keep the area dry and clean.  Call with any questions or concerns.   01/29/23 0758            Diagnostic Studies: No results found.  Disposition: Discharge disposition: 01-Home or Self Care       Discharge Instructions     Call MD / Call 911   Complete by: As directed    If you experience chest pain or shortness of breath, CALL 911 and be transported to the hospital emergency room.  If you develope a fever above 101 F, pus (white drainage) or increased drainage or redness at the wound, or calf pain, call your surgeon's office.   Change dressing   Complete by: As directed    Maintain surgical dressing until follow up in the clinic. If the edges start to pull up, may reinforce with tape. If the dressing is no longer working, may remove and cover with gauze and tape, but must keep the area dry and clean.  Call with any questions or concerns.   Constipation Prevention   Complete by:  As directed    Drink plenty of fluids.  Prune juice may be helpful.  You may use a stool softener, such as Colace (over the counter) 100 mg twice a day.  Use MiraLax (over the counter) for constipation as needed.   Diet - low sodium heart healthy   Complete by: As directed    Increase activity slowly as tolerated   Complete by: As directed    Weight bearing as tolerated with assist device (walker, cane, etc) as directed, use it as long as suggested by your surgeon or therapist, typically at least 4-6 weeks.   Post-operative opioid taper instructions:   Complete by: As directed    POST-OPERATIVE OPIOID TAPER INSTRUCTIONS: It is important to wean off of your opioid medication as soon as possible. If you do not need pain medication after your surgery it is ok to stop day one. Opioids include: Codeine, Hydrocodone(Norco, Vicodin), Oxycodone(Percocet, oxycontin) and hydromorphone amongst others.  Long term and even short term use of opiods can cause: Increased pain response Dependence Constipation Depression Respiratory depression And more.  Withdrawal symptoms can include Flu like symptoms Nausea, vomiting And more Techniques to manage these symptoms Hydrate well Eat regular healthy meals Stay active Use relaxation techniques(deep breathing, meditating, yoga) Do Not substitute Alcohol to help with tapering If you have been on opioids for less than two weeks and do not have pain than it is ok to stop all together.  Plan to wean off of opioids This plan should start within one week post op of your joint replacement. Maintain the same interval or time between taking each dose and first decrease the dose.  Cut the total daily intake of opioids by one tablet each day Next start to increase the time between doses. The last dose that  should be eliminated is the evening dose.      TED hose   Complete by: As directed    Use stockings (TED hose) for 2 weeks on both leg(s).  You may remove  them at night for sleeping.        Follow-up Information     Durene Romans, MD. Go on 02/12/2023.   Specialty: Orthopedic Surgery Why: You are scheduled for first post op appt on Wednesday May 15 at 9:00am. Contact information: [address] [place] [address] [phone]                  Signed: Cassandria Anger 02/03/2023, 11:06 AM

## 2023-02-05 DIAGNOSIS — M1711 Unilateral primary osteoarthritis, right knee: Secondary | ICD-10-CM | POA: Diagnosis not present

## 2023-02-07 DIAGNOSIS — M1711 Unilateral primary osteoarthritis, right knee: Secondary | ICD-10-CM | POA: Diagnosis not present

## 2023-02-10 DIAGNOSIS — M1711 Unilateral primary osteoarthritis, right knee: Secondary | ICD-10-CM | POA: Diagnosis not present

## 2023-02-13 ENCOUNTER — Telehealth: Payer: Self-pay | Admitting: Cardiology

## 2023-02-13 ENCOUNTER — Ambulatory Visit: Payer: Medicare Other | Admitting: Internal Medicine

## 2023-02-13 NOTE — Telephone Encounter (Signed)
Did you add a medication? No  If no, reason? Reason for not adding med: Other Patient declined  Did you remove a medication? No  Did you increase the dosage of any medication? No  Did you decrease the dosage of any medication? No  Did you refer to a specialist (i.e. lipid clinic, preventive cardiology, endocrinology)? Yes  Has patient seen plaque report? No

## 2023-02-14 DIAGNOSIS — M1711 Unilateral primary osteoarthritis, right knee: Secondary | ICD-10-CM | POA: Diagnosis not present

## 2023-02-17 DIAGNOSIS — M1711 Unilateral primary osteoarthritis, right knee: Secondary | ICD-10-CM | POA: Diagnosis not present

## 2023-02-19 DIAGNOSIS — M1711 Unilateral primary osteoarthritis, right knee: Secondary | ICD-10-CM | POA: Diagnosis not present

## 2023-02-20 ENCOUNTER — Ambulatory Visit (INDEPENDENT_AMBULATORY_CARE_PROVIDER_SITE_OTHER): Payer: Medicare Other | Admitting: Family Medicine

## 2023-02-20 VITALS — BP 136/80 | HR 68 | Temp 98.3°F | Resp 18 | Ht 62.0 in | Wt 190.4 lb

## 2023-02-20 DIAGNOSIS — R600 Localized edema: Secondary | ICD-10-CM | POA: Diagnosis not present

## 2023-02-20 DIAGNOSIS — E785 Hyperlipidemia, unspecified: Secondary | ICD-10-CM

## 2023-02-20 DIAGNOSIS — I10 Essential (primary) hypertension: Secondary | ICD-10-CM

## 2023-02-20 DIAGNOSIS — M1711 Unilateral primary osteoarthritis, right knee: Secondary | ICD-10-CM

## 2023-02-20 DIAGNOSIS — R82998 Other abnormal findings in urine: Secondary | ICD-10-CM | POA: Diagnosis not present

## 2023-02-20 LAB — POC URINALSYSI DIPSTICK (AUTOMATED)
Bilirubin, UA: NEGATIVE
Blood, UA: NEGATIVE
Glucose, UA: NEGATIVE
Ketones, UA: NEGATIVE
Nitrite, UA: NEGATIVE
Protein, UA: NEGATIVE
Spec Grav, UA: 1.01 (ref 1.010–1.025)
Urobilinogen, UA: 0.2 E.U./dL
pH, UA: 6 (ref 5.0–8.0)

## 2023-02-20 LAB — CBC WITH DIFFERENTIAL/PLATELET
Basophils Absolute: 0 10*3/uL (ref 0.0–0.1)
Basophils Relative: 1.1 % (ref 0.0–3.0)
Eosinophils Absolute: 0.1 10*3/uL (ref 0.0–0.7)
Eosinophils Relative: 2.3 % (ref 0.0–5.0)
HCT: 35.8 % — ABNORMAL LOW (ref 36.0–46.0)
Hemoglobin: 11.4 g/dL — ABNORMAL LOW (ref 12.0–15.0)
Lymphocytes Relative: 31.7 % (ref 12.0–46.0)
Lymphs Abs: 1.2 10*3/uL (ref 0.7–4.0)
MCHC: 31.9 g/dL (ref 30.0–36.0)
MCV: 96.5 fl (ref 78.0–100.0)
Monocytes Absolute: 0.4 10*3/uL (ref 0.1–1.0)
Monocytes Relative: 9.3 % (ref 3.0–12.0)
Neutro Abs: 2.2 10*3/uL (ref 1.4–7.7)
Neutrophils Relative %: 55.6 % (ref 43.0–77.0)
Platelets: 523 10*3/uL — ABNORMAL HIGH (ref 150.0–400.0)
RBC: 3.71 Mil/uL — ABNORMAL LOW (ref 3.87–5.11)
RDW: 14.2 % (ref 11.5–15.5)
WBC: 3.9 10*3/uL — ABNORMAL LOW (ref 4.0–10.5)

## 2023-02-20 LAB — COMPREHENSIVE METABOLIC PANEL
ALT: 9 U/L (ref 0–35)
AST: 15 U/L (ref 0–37)
Albumin: 4 g/dL (ref 3.5–5.2)
Alkaline Phosphatase: 71 U/L (ref 39–117)
BUN: 10 mg/dL (ref 6–23)
CO2: 27 mEq/L (ref 19–32)
Calcium: 9.8 mg/dL (ref 8.4–10.5)
Chloride: 103 mEq/L (ref 96–112)
Creatinine, Ser: 0.62 mg/dL (ref 0.40–1.20)
GFR: 94.1 mL/min (ref >60.00)
Glucose, Bld: 87 mg/dL (ref 70–99)
Potassium: 4.3 mEq/L (ref 3.5–5.1)
Sodium: 139 mEq/L (ref 135–145)
Total Bilirubin: 0.6 mg/dL (ref 0.2–1.2)
Total Protein: 7 g/dL (ref 6.0–8.3)

## 2023-02-20 MED ORDER — CYCLOBENZAPRINE HCL 5 MG PO TABS
5.0000 mg | ORAL_TABLET | Freq: Three times a day (TID) | ORAL | 1 refills | Status: DC | PRN
Start: 2023-02-20 — End: 2023-10-14

## 2023-02-20 NOTE — Progress Notes (Signed)
Subjective:   By signing my name below, I, [name], attest that this documentation has been prepared under the direction and in the presence of Courtney Spates R, DO. 02/20/2023.   Patient ID: [name], female    DOB: 1955/12/22, 67 y.o.   MRN: [id number]  Chief Complaint  Patient presents with   Hypertension   Follow-up    Pt would also like to discuss labs from the hospital and have labs checked    HPI Patient is in today for an office visit. She underwent right total knee arthroplasty 01/28/2023, performed by [name].   She seems to be recovering well from her knee surgery but is concerned about right ankle swelling with onset about 2-3 days ago. She hasn't noticed any significant shortness of breath. She complains of right knee pain, worse at night. Also she is concerned about interactions between her lisinopril and her prescribed muscle relaxers. She takes tylenol and is weaning off of oxycodone as well. She is completing her physical therapy and usually takes the oxycodone afterwards.  Additionally she complains of nocturia about 3-5 times a night. She is drinking a lot of water, but she doesn't believe it is enough to warrant this many times. When she urinates it is usually a lower volume, but with significant urgency. She denies dysuria.  At her last visit her blood pressure was well controlled. Today, it is 136/80 in the office. BP Readings from Last 3 Encounters:  02/20/23 136/80  01/31/23 139/78  01/15/23 (!) 143/85    Past Medical History:  Diagnosis Date   Abdominal pain, other specified site 03/31/2008   Centricity Description: FLANK PAIN, RIGHT Qualifier: Diagnosis of  By: Courtney Ren MD, [name]   Centricity Description: ABDOMINAL PAIN OTHER SPECIFIED SITE Qualifier: Diagnosis of  By: [name]     Acute sprain or strain of cervical region 04/10/2012   ALLERGIC RHINITIS 06/26/2007   Qualifier: Diagnosis of  By: [name]     ANKYLOSING  SPONDYLITIS 03/31/2008   Qualifier: Diagnosis of  By: Courtney Ren MD, [name]     Ankylosing spondylitis (HCC)    neck and spin   Ankylosing spondylitis of multiple sites in spine [facility])    Arthritis    Back pain    Bariatric surgery status 01/14/2008   Qualifier: Diagnosis of  By: [name]     Blood in stool 12/02/2008   Qualifier: Diagnosis of  By: [name]     Blood transfusion without reported diagnosis    Calf pain 10/13/2012   CHEST PAIN UNSPECIFIED 10/11/2009   Qualifier: Diagnosis of  By: [name]    CONSTIPATION 04/06/2010   Qualifier: Diagnosis of  By: [name]     CORNEAL ABRASION, LEFT 01/22/2010   Qualifier: Diagnosis of  By: [name]     Gastritis    GERD 06/26/2007   Qualifier: Diagnosis of  By: [name]     GERD (gastroesophageal reflux disease)    Hiatal hernia    Hyperlipidemia    Hypertension    no medications now   IBS 06/26/2007   Qualifier: Diagnosis of  By: [name]     Joint pain    Lactose intolerance    LEG CRAMPS, NOCTURNAL 11/14/2010   Qualifier: Diagnosis of  By: [name]     Long-term current use of steroids 04/20/2013   LYMPHADENOPATHY 06/10/2007   Qualifier: Diagnosis of  By: Courtney Axon  DO, [name]     MORBID OBESITY 06/26/2007   Qualifier: Diagnosis of  By: [name]     MUSCLE PAIN 03/31/2008   Qualifier: Diagnosis of  By: Courtney Ren MD, [name]     Noninfectious gastroenteritis and colitis 03/17/2013   Obesity (BMI 30-39.9) 03/16/2014   OSA (obstructive sleep apnea) 03/08/2016   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/23/2010   Qualifier: Diagnosis of  By: [name]     Rheumatoid arthritis (HCC)    Seasonal allergies 05/14/2015   THYROID CYST 06/26/2007   Qualifier: Diagnosis of  By: [name]     THYROID NODULE, HX OF 06/10/2007   Qualifier: Diagnosis of  By: [name]     Vitamin D deficiency     Past Surgical History:  Procedure Laterality Date    BARIATRIC SURGERY     BREAST REDUCTION SURGERY  1996   COLONOSCOPY  10/24/2009   kaplan-normal exam   ESOPHAGOGASTRODUODENOSCOPY     pelvis replacement  2010   TOTAL HIP ARTHROPLASTY     x 2 left, one revision   TOTAL KNEE ARTHROPLASTY Right 01/28/2023   Procedure: TOTAL KNEE ARTHROPLASTY;  Surgeon: Durene Romans, MD;  Location: [facility];  Service: Orthopedics;  Laterality: Right;   UPPER GASTROINTESTINAL ENDOSCOPY      Family History  Problem Relation Age of Onset   Hypertension Mother    High Cholesterol Mother    Diabetes Mother    Bladder Cancer Mother    Arthritis Father    Hypertension Father    Diabetes Father    High Cholesterol Father    Hypertension Sister    Diabetes Sister    Melanoma Maternal Grandmother    Breast cancer Maternal Grandmother        and Aunt   Colon cancer Neg Hx    Esophageal cancer Neg Hx    Rectal cancer Neg Hx    Stomach cancer Neg Hx    Colon polyps Neg Hx     Social History   Socioeconomic History   Marital status: Married    Spouse name: Johnella Kolin   Number of children: Not on file   Years of education: Not on file   Highest education level: Master's degree (e.g., MA, MS, MEng, MEd, MSW, MBA)  Occupational History   Occupation: retired  Tobacco Use   Smoking status: Never   Smokeless tobacco: Never  Vaping Use   Vaping Use: Never used  Substance and Sexual Activity   Alcohol use: No    Alcohol/week: 0.0 standard drinks of alcohol   Drug use: No   Sexual activity: Yes  Other Topics Concern   Not on file  Social History Narrative   Occupation: Sports coach   Daily Caffeine use- 1 cup daily      Social Determinants of Health   Financial Resource Strain: Low Risk  (02/20/2023)   Overall Financial Resource Strain (CARDIA)    Difficulty of Paying Living Expenses: Not hard at all  Food Insecurity: No Food Insecurity (02/20/2023)   Hunger Vital Sign    Worried About Running Out of Food in the Last Year: Never true     Ran Out of Food in the Last Year: Never true  Transportation Needs: No Transportation Needs (02/20/2023)   PRAPARE - Administrator, Civil Service (Medical): No    Lack of Transportation (Non-Medical): No  Physical Activity: Inactive (02/20/2023)   Exercise Vital Sign  Days of Exercise per Week: 0 days    Minutes of Exercise per Session: 0 min  Stress: No Stress Concern Present (02/20/2023)   [employer] of Occupational Health - Occupational Stress Questionnaire    Feeling of Stress : Not at all  Social Connections: Socially Integrated (02/20/2023)   Social Connection and Isolation Panel [NHANES]    Frequency of Communication with Friends and Family: More than three times a week    Frequency of Social Gatherings with Friends and Family: More than three times a week    Attends Religious Services: More than 4 times per year    Active Member of [employer] or Organizations: Yes    Attends Engineer, structural: More than 4 times per year    Marital Status: Married  Catering manager Violence: Not At Risk (01/28/2023)   Humiliation, Afraid, Rape, and Kick questionnaire    Fear of Current or Ex-Partner: No    Emotionally Abused: No    Physically Abused: No    Sexually Abused: No    Outpatient Medications Prior to Visit  Medication Sig Dispense Refill   bariatric multiple vitamin (ADVANCED MULTI EA) CHEW chewable tablet Chew 2 tablets by mouth daily.     Biotin w/ Vitamins C & E ([name] SKIN & NAILS GUMMIES PO) Take 1 tablet by mouth daily.     Calcium Carb-Cholecalciferol (CALTRATE 600+D3 SOFT PO) Take 1 tablet by mouth daily.     chlorthalidone (HYGROTON) 25 MG tablet Take 0.5 tablets (12.5 mg total) by mouth daily. 15 tablet 3   esomeprazole (NEXIUM) 40 MG capsule Take 1 capsule (40 mg total) by mouth daily as needed. Crack open capsule and ingest contents. Please keep your January appointment for further refills. Thank you 30 capsule 1   hydrOXYzine (ATARAX) 10 MG  tablet Take 1 tablet (10 mg total) by mouth 3 (three) times daily as needed for itching. 30 tablet 0   lisinopril (ZESTRIL) 10 MG tablet Take 1 tablet (10 mg total) by mouth daily. 90 tablet 3   ondansetron (ZOFRAN) 4 MG tablet Take 1 tablet (4 mg total) by mouth every 6 (six) hours as needed for nausea. 20 tablet 0   oxyCODONE (OXY IR/ROXICODONE) 5 MG immediate release tablet Take 1-2 tablets (5-10 mg total) by mouth every 4 (four) hours as needed for severe pain. 42 tablet 0   polyethylene glycol (MIRALAX / GLYCOLAX) 17 g packet Take 17 g by mouth 2 (two) times daily. 14 each 0   sucralfate (CARAFATE) 1 g tablet Take 1 g by mouth 4 (four) times daily -  with meals and at bedtime.     tiZANidine (ZANAFLEX) 4 MG tablet Take 1 tablet (4 mg total) by mouth every 8 (eight) hours as needed for muscle spasms. 30 tablet 2   rivaroxaban (XARELTO) 10 MG TABS tablet Take 1 tablet (10 mg total) by mouth daily with breakfast for 14 days. 14 tablet 0   No facility-administered medications prior to visit.    Allergies  Allergen Reactions   Aspirin     REACTION: intolerance-gi upset   Codeine Itching   Morphine Itching   Norvasc [Amlodipine]    Sulfa Antibiotics Other (See Comments)    Unsure of reaction   Sulfonamide Derivatives     Unsure of reaction    Review of Systems  Constitutional:  Negative for fever and malaise/fatigue.  HENT:  Negative for congestion.   Eyes:  Negative for blurred vision.  Respiratory:  Negative for shortness  of breath.   Cardiovascular:  Positive for leg swelling (Right LE). Negative for chest pain and palpitations.  Gastrointestinal:  Negative for abdominal pain, blood in stool and nausea.  Genitourinary:  Positive for frequency (nocturnal) and urgency. Negative for dysuria.  Musculoskeletal:  Positive for joint pain (Right knee). Negative for falls.  Skin:  Negative for rash.  Neurological:  Negative for dizziness, loss of consciousness and headaches.   Endo/Heme/Allergies:  Negative for environmental allergies.  Psychiatric/Behavioral:  Negative for depression. The patient is not nervous/anxious.        Objective:    Physical Exam Vitals and nursing note reviewed.  Constitutional:      Appearance: Normal appearance. She is well-developed.  HENT:     Head: Normocephalic and atraumatic.     Right Ear: Tympanic membrane, ear canal and external ear normal.     Left Ear: Tympanic membrane, ear canal and external ear normal.  Eyes:     Extraocular Movements: Extraocular movements intact.     Conjunctiva/sclera: Conjunctivae normal.     Pupils: Pupils are equal, round, and reactive to light.  Neck:     Thyroid: No thyromegaly.     Vascular: No carotid bruit or JVD.  Cardiovascular:     Rate and Rhythm: Normal rate and regular rhythm.     Heart sounds: Normal heart sounds. No murmur heard.    No gallop.  Pulmonary:     Effort: Pulmonary effort is normal. No respiratory distress.     Breath sounds: Normal breath sounds. No wheezing or rales.  Chest:     Chest wall: No tenderness.  Musculoskeletal:        General: Normal range of motion.     Cervical back: Normal range of motion and neck supple.     Right lower leg: 1+ Pitting Edema present.     Left lower leg: No edema.     Comments: Surgical incision of right knee healing well.  Skin:    General: Skin is warm and dry.  Neurological:     General: No focal deficit present.     Mental Status: She is alert and oriented to person, place, and time.     Motor: No weakness.     Gait: Gait normal.  Psychiatric:        Mood and Affect: Mood normal.        Behavior: Behavior normal.     BP 136/80 (BP Location: Right Arm, Patient Position: Sitting, Cuff Size: Large)   Pulse 68   Temp 98.3 F (36.8 C) (Oral)   Resp 18   Ht 5\' 2"  (1.575 m)   Wt 190 lb 6.4 oz (86.4 kg)   SpO2 98%   BMI 34.82 kg/m  Wt Readings from Last 3 Encounters:  02/20/23 190 lb 6.4 oz (86.4 kg)   01/28/23 185 lb 3.2 oz (84 kg)  01/15/23 185 lb 3.2 oz (84 kg)    Diabetic Foot Exam - Simple   No data filed    Lab Results  Component Value Date   WBC 10.6 (H) 01/30/2023   HGB 10.5 (L) 01/30/2023   HCT 32.8 (L) 01/30/2023   PLT 274 01/30/2023   GLUCOSE 98 01/29/2023   CHOL 155 12/30/2022   TRIG 68.0 12/30/2022   HDL 79.60 12/30/2022   LDLCALC 62 12/30/2022   ALT 16 12/30/2022   AST 21 12/30/2022   NA 134 (L) 01/29/2023   K 3.3 (L) 01/29/2023   CL 103 01/29/2023  CREATININE 0.57 01/29/2023   BUN 11 01/29/2023   CO2 23 01/29/2023   TSH 1.65 12/30/2022   INR 1.2 09/26/2008   HGBA1C 5.5 04/01/2022    Lab Results  Component Value Date   TSH 1.65 12/30/2022   Lab Results  Component Value Date   WBC 10.6 (H) 01/30/2023   HGB 10.5 (L) 01/30/2023   HCT 32.8 (L) 01/30/2023   MCV 97.3 01/30/2023   PLT 274 01/30/2023   Lab Results  Component Value Date   NA 134 (L) 01/29/2023   K 3.3 (L) 01/29/2023   CO2 23 01/29/2023   GLUCOSE 98 01/29/2023   BUN 11 01/29/2023   CREATININE 0.57 01/29/2023   BILITOT 0.5 12/30/2022   ALKPHOS 85 12/30/2022   AST 21 12/30/2022   ALT 16 12/30/2022   PROT 7.1 12/30/2022   ALBUMIN 4.2 12/30/2022   CALCIUM 8.6 (L) 01/29/2023   ANIONGAP 8 01/29/2023   EGFR 98 09/13/2022   GFR 94.57 12/30/2022   Lab Results  Component Value Date   CHOL 155 12/30/2022   Lab Results  Component Value Date   HDL 79.60 12/30/2022   Lab Results  Component Value Date   LDLCALC 62 12/30/2022   Lab Results  Component Value Date   TRIG 68.0 12/30/2022   Lab Results  Component Value Date   CHOLHDL 2 12/30/2022   Lab Results  Component Value Date   HGBA1C 5.5 04/01/2022       Assessment & Plan:   Problem List Items Addressed This Visit       Unprioritized   Primary osteoarthritis of right knee   Relevant Medications   cyclobenzaprine (FLEXERIL) 5 MG tablet   Leukocytes in urine   Relevant Orders   Urine Culture    Hyperlipidemia    Encourage heart healthy diet such as MIND or DASH diet, increase exercise, avoid trans fats, simple carbohydrates and processed foods, consider a krill or fish or flaxseed oil cap daily.        Relevant Orders   POCT Urinalysis Dipstick (Automated) (Completed)   CBC with Differential/Platelet   Comprehensive metabolic panel   Essential hypertension    Well controlled, no changes to meds. Encouraged heart healthy diet such as the DASH diet and exercise as tolerated.        Relevant Orders   POCT Urinalysis Dipstick (Automated) (Completed)   CBC with Differential/Platelet   Comprehensive metabolic panel   Edema of right lower extremity - Primary    Pt has not been taking her chlorthalidone  She will start taking it today        Meds ordered this encounter  Medications   cyclobenzaprine (FLEXERIL) 5 MG tablet    Sig: Take 1 tablet (5 mg total) by mouth 3 (three) times daily as needed for muscle spasms.    Dispense:  30 tablet    Refill:  1    I, Donato Schultz, DO, personally preformed the services described in this documentation.  All medical record entries made by the scribe were at my direction and in my presence.  I have reviewed the chart and discharge instructions (if applicable) and agree that the record reflects my personal performance and is accurate and complete. 02/20/2023.  I,Mathew Stumpf,acting as a Neurosurgeon for [employer], DO.,have documented all relevant documentation on the behalf of Donato Schultz, DO,as directed by  Donato Schultz, DO while in the presence of Donato Schultz, DO.  Donato Schultz, DO

## 2023-02-20 NOTE — Assessment & Plan Note (Signed)
Pt has not been taking her chlorthalidone  She will start taking it today

## 2023-02-20 NOTE — Assessment & Plan Note (Signed)
Well controlled, no changes to meds. Encouraged heart healthy diet such as the DASH diet and exercise as tolerated.  °

## 2023-02-20 NOTE — Assessment & Plan Note (Signed)
Encourage heart healthy diet such as MIND or DASH diet, increase exercise, avoid trans fats, simple carbohydrates and processed foods, consider a krill or fish or flaxseed oil cap daily.  °

## 2023-02-21 DIAGNOSIS — M1711 Unilateral primary osteoarthritis, right knee: Secondary | ICD-10-CM | POA: Diagnosis not present

## 2023-02-21 LAB — URINE CULTURE
MICRO NUMBER:: 14995527
SPECIMEN QUALITY:: ADEQUATE

## 2023-02-26 DIAGNOSIS — M1711 Unilateral primary osteoarthritis, right knee: Secondary | ICD-10-CM | POA: Diagnosis not present

## 2023-03-03 ENCOUNTER — Ambulatory Visit: Payer: Medicare Other

## 2023-03-03 NOTE — Progress Notes (Deleted)
Office Visit    Patient Name: [name] Date of Encounter: 03/03/2023  Primary Care Provider:  Zola Button, Grayling Congress, DO Primary Cardiologist:  Donato Schultz MD  Chief Complaint    Hypertension  Significant Past Medical History   obesity H/o gastric bypass 2008  CAD Recent CAC = 94 - 86 the percentil    Allergies  Allergen Reactions   Aspirin     REACTION: intolerance-gi upset   Codeine Itching   Morphine Itching   Norvasc [Amlodipine]    Sulfa Antibiotics Other (See Comments)    Unsure of reaction   Sulfonamide Derivatives     Unsure of reaction    History of Present Illness    [name] is a 67 y.o. female patient of Dr Anne Fu, in the office today for hypertension follow up.   She was seen by Carmela Hurt about a month ago, at which time her BP was noted to be 155/91.  Dr. Allena Katz switched her lisinopril to losartan, however this caused her have a heaviness in her chest.  She restarted the lisinopril, and it was increased to 10 mg daily.  She was asked to continue with home monitoring and bring that information to her follow up.   At gastric bypass lost 90 lbs, now back up to 186  (was 238 at start).  She is interested in [place], but until it will be covered by Medicare, it is not affordable.  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  I added chlorthalidone 12.5 mg daily.  We also discussed her calcium score (94 - 86th percentile).  She was hesitant to take rosuvastatin, and asked for an Lp(a) to further determine risk. Lp(a) was 104 and her PCP recommended starting rosuvastatin.  Today she returns for follow up.       Blood Pressure Goal:  130/80  Current Medications:  lisinopril 10 mg qam  Previously tried:  losartan - chest discomfort, amlodipine - chest discomfort; carvedilol - doesn't agree with her  Family Hx: both parents  with hypertension, neither with heart disease, dad and sister diabetic (sister w/o htn)  64 and 48, no children  Social Hx:      Tobacco: none  Alcohol: occasional   Caffeine: 1 coffee in am and rest of the day - water, zero Gatorade  Diet:    eats 5 smaller meals daily 2/2 gastric bypass, now working with   -high protein diet -chicken, fish -had gastric by pass 2006 gained 30 -40 lbs 3  Terrible snacker - crackers, cheese, not much sugar  Fairlife chocolate protein and egg or yogurt with nuts for breakfast  Has been eating out more in the past couple of weeks, running back and forth to care for parents     Exercise: due for right knee replacement so does not do any exercise also taking care of elderly parents which consume 90% of time   Home BP readings:  home Omron cuff, fairly new   tested her cuff in the office today and it read within 5 points systolic; 10 points diastolic 25 readings at home average 132/88  (range 118-145/75-95)  Accessory Clinical Findings    Lab Results  Component Value Date   CREATININE 0.62 02/20/2023   BUN 10 02/20/2023   NA 139 02/20/2023   K 4.3 02/20/2023   CL  103 02/20/2023   CO2 27 02/20/2023   Lab Results  Component Value Date   ALT 9 02/20/2023   AST 15 02/20/2023   ALKPHOS 71 02/20/2023   BILITOT 0.6 02/20/2023   Lab Results  Component Value Date   HGBA1C 5.5 04/01/2022    Home Medications    Current Outpatient Medications  Medication Sig Dispense Refill   bariatric multiple vitamin (ADVANCED MULTI EA) CHEW chewable tablet Chew 2 tablets by mouth daily.     Biotin w/ Vitamins C & E (HAIR SKIN & NAILS GUMMIES PO) Take 1 tablet by mouth daily.     Calcium Carb-Cholecalciferol (CALTRATE 600+D3 SOFT PO) Take 1 tablet by mouth daily.     chlorthalidone (HYGROTON) 25 MG tablet Take 0.5 tablets (12.5 mg total) by mouth daily. 15 tablet 3   cyclobenzaprine (FLEXERIL) 5 MG tablet Take 1 tablet (5 mg total) by mouth 3 (three) times daily as  needed for muscle spasms. 30 tablet 1   esomeprazole (NEXIUM) 40 MG capsule Take 1 capsule (40 mg total) by mouth daily as needed. Crack open capsule and ingest contents. Please keep your January appointment for further refills. Thank you 30 capsule 1   hydrOXYzine (ATARAX) 10 MG tablet Take 1 tablet (10 mg total) by mouth 3 (three) times daily as needed for itching. 30 tablet 0   lisinopril (ZESTRIL) 10 MG tablet Take 1 tablet (10 mg total) by mouth daily. 90 tablet 3   ondansetron (ZOFRAN) 4 MG tablet Take 1 tablet (4 mg total) by mouth every 6 (six) hours as needed for nausea. 20 tablet 0   oxyCODONE (OXY IR/ROXICODONE) 5 MG immediate release tablet Take 1-2 tablets (5-10 mg total) by mouth every 4 (four) hours as needed for severe pain. 42 tablet 0   polyethylene glycol (MIRALAX / GLYCOLAX) 17 g packet Take 17 g by mouth 2 (two) times daily. 14 each 0   rivaroxaban (XARELTO) 10 MG TABS tablet Take 1 tablet (10 mg total) by mouth daily with breakfast for 14 days. 14 tablet 0   sucralfate (CARAFATE) 1 g tablet Take 1 g by mouth 4 (four) times daily -  with meals and at bedtime.     No current facility-administered medications for this visit.     No BP recorded.  {Refresh Note OR Click here to enter BP  :1}***   Assessment & Plan    No problem-specific Assessment & Plan notes found for this encounter.  Phillips Hay PharmD CPP [facility]  [address] [place], [place] [address] [phone]

## 2023-03-04 DIAGNOSIS — M1711 Unilateral primary osteoarthritis, right knee: Secondary | ICD-10-CM | POA: Diagnosis not present

## 2023-03-06 DIAGNOSIS — M1711 Unilateral primary osteoarthritis, right knee: Secondary | ICD-10-CM | POA: Diagnosis not present

## 2023-03-11 DIAGNOSIS — M1711 Unilateral primary osteoarthritis, right knee: Secondary | ICD-10-CM | POA: Diagnosis not present

## 2023-03-13 DIAGNOSIS — M1711 Unilateral primary osteoarthritis, right knee: Secondary | ICD-10-CM | POA: Diagnosis not present

## 2023-03-18 DIAGNOSIS — M1711 Unilateral primary osteoarthritis, right knee: Secondary | ICD-10-CM | POA: Diagnosis not present

## 2023-03-20 DIAGNOSIS — M1711 Unilateral primary osteoarthritis, right knee: Secondary | ICD-10-CM | POA: Diagnosis not present

## 2023-03-24 DIAGNOSIS — M1711 Unilateral primary osteoarthritis, right knee: Secondary | ICD-10-CM | POA: Diagnosis not present

## 2023-03-26 DIAGNOSIS — M1711 Unilateral primary osteoarthritis, right knee: Secondary | ICD-10-CM | POA: Diagnosis not present

## 2023-03-26 DIAGNOSIS — Z96651 Presence of right artificial knee joint: Secondary | ICD-10-CM | POA: Diagnosis not present

## 2023-03-27 DIAGNOSIS — M25561 Pain in right knee: Secondary | ICD-10-CM | POA: Diagnosis not present

## 2023-03-27 DIAGNOSIS — Z96651 Presence of right artificial knee joint: Secondary | ICD-10-CM | POA: Diagnosis not present

## 2023-03-28 DIAGNOSIS — M25561 Pain in right knee: Secondary | ICD-10-CM | POA: Diagnosis not present

## 2023-03-28 DIAGNOSIS — Z96651 Presence of right artificial knee joint: Secondary | ICD-10-CM | POA: Diagnosis not present

## 2023-03-31 DIAGNOSIS — M1711 Unilateral primary osteoarthritis, right knee: Secondary | ICD-10-CM | POA: Diagnosis not present

## 2023-04-08 DIAGNOSIS — M1711 Unilateral primary osteoarthritis, right knee: Secondary | ICD-10-CM | POA: Diagnosis not present

## 2023-04-10 ENCOUNTER — Ambulatory Visit: Payer: Medicare Other | Admitting: Family Medicine

## 2023-04-10 DIAGNOSIS — M1711 Unilateral primary osteoarthritis, right knee: Secondary | ICD-10-CM | POA: Diagnosis not present

## 2023-04-15 DIAGNOSIS — M1711 Unilateral primary osteoarthritis, right knee: Secondary | ICD-10-CM | POA: Diagnosis not present

## 2023-04-16 ENCOUNTER — Ambulatory Visit: Payer: Medicare Other

## 2023-04-17 DIAGNOSIS — M1711 Unilateral primary osteoarthritis, right knee: Secondary | ICD-10-CM | POA: Diagnosis not present

## 2023-04-21 DIAGNOSIS — M1711 Unilateral primary osteoarthritis, right knee: Secondary | ICD-10-CM | POA: Diagnosis not present

## 2023-04-23 DIAGNOSIS — M1711 Unilateral primary osteoarthritis, right knee: Secondary | ICD-10-CM | POA: Diagnosis not present

## 2023-04-24 ENCOUNTER — Ambulatory Visit: Payer: Medicare Other | Admitting: Gastroenterology

## 2023-04-29 DIAGNOSIS — M1711 Unilateral primary osteoarthritis, right knee: Secondary | ICD-10-CM | POA: Diagnosis not present

## 2023-04-29 NOTE — Progress Notes (Deleted)
HPI F never smoker followed for OSA, complicated by  HTN, GERD, IBS, Thyroid Cyst, Ankylosing Spondylitis, Urticaria, Morbid  Obesity s/p Bariatric Surgery, Allergic Rhinitis NPSG 03/07/16- AHI 15.7/ hr, desaturation to 84%, body weight 194 lbs HST 04/24/20- AHI 24.1/ hr, desaturation to 80%, body weight 202 lbs  =======================================================   02/12/22-  65 yoF never smoker followed for OSA, complicated by  HTN, GERD, IBS, Thyroid Cyst, Ankylosing Spondylitis, Urticaria, Morbid  Obesity s/p Bariatric Surgery, Allergic Rhinitis CPAP auto 4-10/ [employer] Download-compliance 47%, AHI 0.7/ hr Body weight today 195 lbs Covid vax- 4 Phizer Flu vax- -----Patient would like new sleep study and would like to talk about inspire Because of ankylosing spondylitis she is mostly comfortable sleeping on her left side.  In that position she snores less.  Download reviewed.  She is aware that she has been using CPAP less consistently.  Had questions about alternatives and we discussed some, especially inspire.  We had reduced her pressures last time and that took care of the nasal stuffiness.  She would like an updated sleep study which we discussed. CTachest/aorta 07/25/21- IMPRESSION: 1. No CT evidence of pulmonary embolism or acute cardiopulmonary disease. 2. Small hiatal hernia. Aortic Atherosclerosis (ICD10-I70.0).  05/01/23-   66 yoF never smoker followed for OSA, complicated by  HTN, GERD, IBS, Thyroid Cyst, Ankylosing Spondylitis, Urticaria, Morbid  Obesity s/p Bariatric Surgery, Allergic Rhinitis CPAP auto 4-10/ [employer] Download-compliance  Body weight today  R TKR 01/28/23   ROS-see HPI   + = positive Constitutional:    +weight loss, night sweats, fevers, chills, fatigue, lassitude. HEENT:    headaches, difficulty swallowing, tooth/dental problems, sore throat,       sneezing, itching, ear ache, nasal congestion, post nasal drip, snoring CV:    chest pain, orthopnea, PND,  swelling in lower extremities, anasarca,                                   dizziness, +palpitations Resp:   shortness of breath with exertion or at rest.                productive cough,   non-productive cough, coughing up of blood.              change in color of mucus.  wheezing.   Skin:    rash or lesions. GI:  +-   Heartburn,+ indigestion, abdominal pain, nausea, vomiting, diarrhea,                 change in bowel habits, loss of appetite GU: dysuria, change in color of urine, no urgency or frequency.   flank pain. MS:   +joint pain, stiffness, decreased range of motion, +back pain. Neuro-     nothing unusual Psych:  change in mood or affect.  depression or anxiety.   memory loss.  OBJ- Physical Exam General- Alert, Oriented, Affect-appropriate, Distress- none acute., + obese Skin- rash-none, lesions- none, excoriation- none Lymphadenopathy- none Head- atraumatic            Eyes- Gross vision intact, PERRLA, conjunctivae and secretions clear            Ears- Hearing, canals-normal            Nose- Clear, no-Septal dev, mucus, polyps, erosion, perforation             Throat- Mallampati IV , mucosa clear , drainage- none, tonsils- atrophic, + teeth  Neck- flexible , trachea midline, no stridor , thyroid nl, carotid no bruit Chest - symmetrical excursion , unlabored           Heart/CV- RRR , no murmur , no gallop  , no rub, nl s1 s2                           - JVD- none , edema- none, stasis changes- none, varices- none           Lung- clear to P&A, wheeze- none, cough- none , dullness-none, rub- none           Chest wall-  Abd-  Br/ Gen/ Rectal- Not done, not indicated Extrem- cyanosis- none, clubbing, none, atrophy- none, strength- nl Neuro- grossly intact to observation

## 2023-05-01 ENCOUNTER — Ambulatory Visit: Payer: Medicare Other | Admitting: Internal Medicine

## 2023-05-14 NOTE — Progress Notes (Deleted)
Office Visit Note  Patient: [name]             Date of Birth: Jun 07, 1956           MRN: [id number]             PCP: Donato Schultz, DO Referring: Donato Schultz, * Visit Date: 05/27/2023 Occupation: @GUAROCC @  Subjective:  No chief complaint on file.   History of Present Illness: [name] is a 66 y.o. female ***patient returns after the last visit on November 17, 2017.  She was initially diagnosed with ankylosing spondylitis by Dr. Leida Lauth about 20 years ago.  Later she was under care of Dr. Kellie Simmering for many years.  Patient was given Enbrel couple of times but she stopped as she was concerned about the side effects.  She stayed on prednisone on as needed basis.  In 2019 at the last visit I discussed possible use of Cosentyx with the patient.   November 17, 2017 HPI: [name] is a 67 y.o. female seen in consultation per request of her PCP.  According to patient at age 82 she had left total hip replacement.  10 years later she slipped down the stairs and had revision of left total hip replacement.  In 2009 she had second revision which lasted for only short time.  She had to undergo hip and pelvis replacement in [place] in 2010.  She recalls in 1976 she was having a lot of neck and base of her skull discomfort for which she was seen by several physicians and the symptoms eventually resolved.  Over the last 20 years she has had increased neck pain and stiffness and decreased range of motion.  She has been under care of Dr. Leida Lauth who diagnosed her with ankylosing spondylitis and started her on Enbrel.  According to patient she read about the side effects of Enbrel and discontinued the medication.  She states she has taken only prednisone taper as needed for flares.  After Dr. Leida Lauth retired she started seeing Dr. Kellie Simmering.  Who also advised Enbrel but she decided to take only prednisone tapers.  She states she usually takes prednisone 5 mg p.o. daily for  short.  And then stops it.  She is also tried physical therapy.  Recently she has been experiencing increased pain in her hands and stiffness and some swelling.  She is also had right knee joint swelling and pain for multiple years which has been causing more discomfort lately.  She has decreased range of motion of her C-spine.  She has some stiffness in her C-spine and lower back. She has Hx of iritis.  Her last flare was about 3 months ago.  She states she is to have frequent flares but now the flares have become less frequent.    Activities of Daily Living:  Patient reports morning stiffness for *** {minute/hour:19697}.   Patient {ACTIONS;DENIES/REPORTS:21021675::"Denies"} nocturnal pain.  Difficulty dressing/grooming: {ACTIONS;DENIES/REPORTS:21021675::"Denies"} Difficulty climbing stairs: {ACTIONS;DENIES/REPORTS:21021675::"Denies"} Difficulty getting out of chair: {ACTIONS;DENIES/REPORTS:21021675::"Denies"} Difficulty using hands for taps, buttons, cutlery, and/or writing: {ACTIONS;DENIES/REPORTS:21021675::"Denies"}  No Rheumatology ROS completed.   PMFS History:  Patient Active Problem List   Diagnosis Date Noted   Hyperlipidemia 02/20/2023   Edema of right lower extremity 02/20/2023   Leukocytes in urine 02/20/2023   S/P total knee arthroplasty, right 01/28/2023   ASCVD (arteriosclerotic cardiovascular disease) 12/23/2022   Preventative health care 10/01/2022   Elevated serum creatinine 06/26/2021   COVID-19 04/26/2021   Diverticulitis  02/20/2021   Depression 07/10/2020   History of gastric bypass 07/10/2020   Has daytime drowsiness 05/09/2020   Other fatigue 02/29/2020   Short of breath on exertion 02/29/2020   Vitamin D deficiency 08/11/2018   Gastric bypass status for obesity 07/30/2018   Hives 06/07/2018   Primary osteoarthritis of right knee 01/23/2018   Primary osteoarthritis of both hands 01/23/2018   History of iritis 11/28/2017   History of total hip replacement,  left 11/28/2017   Primary osteoarthritis of both knees 11/28/2017   Obstructive sleep apnea 03/08/2016   Seasonal allergies 05/14/2015   Obesity (BMI 30-39.9) 03/16/2014   Long-term current use of steroids 04/20/2013   Noninfectious gastroenteritis and colitis 03/17/2013   Calf pain 10/13/2012   Acute sprain or strain of cervical region 04/10/2012   LEG CRAMPS, NOCTURNAL 11/14/2010   OTHER SPECIFIED DISEASE OF WHITE BLOOD CELLS 07/23/2010   CONSTIPATION 04/06/2010   CORNEAL ABRASION, LEFT 01/22/2010   CHEST PAIN UNSPECIFIED 10/11/2009   BLOOD IN STOOL 12/02/2008   Ankylosing spondylitis (HCC) 03/31/2008   MUSCLE PAIN 03/31/2008   BARIATRIC SURGERY STATUS 01/14/2008   THYROID CYST 06/26/2007   Class 2 severe obesity with serious comorbidity and body mass index (BMI) of 36.0 to 36.9 in adult [facility]) 06/26/2007   Essential hypertension 06/26/2007   GERD 06/26/2007   IBS 06/26/2007   LYMPHADENOPATHY 06/10/2007   History of thyroid nodule 06/10/2007    Past Medical History:  Diagnosis Date   Abdominal pain, other specified site 03/31/2008   Centricity Description: FLANK PAIN, RIGHT Qualifier: Diagnosis of  By: Alwyn Ren MD, William   Centricity Description: ABDOMINAL PAIN OTHER SPECIFIED SITE Qualifier: Diagnosis of  By: Janit Bern     Acute sprain or strain of cervical region 04/10/2012   ALLERGIC RHINITIS 06/26/2007   Qualifier: Diagnosis of  By: Charlsie Quest RMA, Lucy     ANKYLOSING SPONDYLITIS 03/31/2008   Qualifier: Diagnosis of  By: Alwyn Ren MD, William     Ankylosing spondylitis (HCC)    neck and spin   Ankylosing spondylitis of multiple sites in spine (HCC)    Arthritis    Back pain    Bariatric surgery status 01/14/2008   Qualifier: Diagnosis of  By: Janit Bern     Blood in stool 12/02/2008   Qualifier: Diagnosis of  By: Janit Bern     Blood transfusion without reported diagnosis    Calf pain 10/13/2012   CHEST PAIN UNSPECIFIED 10/11/2009   Qualifier: Diagnosis  of  By: Arlyce Dice MD, Barbette Hair    CONSTIPATION 04/06/2010   Qualifier: Diagnosis of  By: Janit Bern     CORNEAL ABRASION, LEFT 01/22/2010   Qualifier: Diagnosis of  By: Janit Bern     Gastritis    GERD 06/26/2007   Qualifier: Diagnosis of  By: Charlsie Quest RMA, Lucy     GERD (gastroesophageal reflux disease)    Hiatal hernia    Hyperlipidemia    Hypertension    no medications now   IBS 06/26/2007   Qualifier: Diagnosis of  By: Charlsie Quest RMA, Lucy     Joint pain    Lactose intolerance    LEG CRAMPS, NOCTURNAL 11/14/2010   Qualifier: Diagnosis of  By: Floydene Flock     Long-term current use of steroids 04/20/2013   LYMPHADENOPATHY 06/10/2007   Qualifier: Diagnosis of  By: Janit Bern     MORBID OBESITY 06/26/2007   Qualifier: Diagnosis of  By: Samara Snide  MUSCLE PAIN 03/31/2008   Qualifier: Diagnosis of  By: Alwyn Ren MD, William     Noninfectious gastroenteritis and colitis 03/17/2013   Obesity (BMI 30-39.9) 03/16/2014   OSA (obstructive sleep apnea) 03/08/2016   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/23/2010   Qualifier: Diagnosis of  By: Janit Bern     Rheumatoid arthritis (HCC)    Seasonal allergies 05/14/2015   THYROID CYST 06/26/2007   Qualifier: Diagnosis of  By: Charlsie Quest RMA, Lucy     THYROID NODULE, HX OF 06/10/2007   Qualifier: Diagnosis of  By: Janit Bern     Vitamin D deficiency     Family History  Problem Relation Age of Onset   Hypertension Mother    High Cholesterol Mother    Diabetes Mother    Bladder Cancer Mother    Arthritis Father    Hypertension Father    Diabetes Father    High Cholesterol Father    Hypertension Sister    Diabetes Sister    Melanoma Maternal Grandmother    Breast cancer Maternal Grandmother        and Aunt   Colon cancer Neg Hx    Esophageal cancer Neg Hx    Rectal cancer Neg Hx    Stomach cancer Neg Hx    Colon polyps Neg Hx    Past Surgical History:  Procedure Laterality Date   BARIATRIC  SURGERY     BREAST REDUCTION SURGERY  1996   COLONOSCOPY  10/24/2009   kaplan-normal exam   ESOPHAGOGASTRODUODENOSCOPY     pelvis replacement  2010   TOTAL HIP ARTHROPLASTY     x 2 left, one revision   TOTAL KNEE ARTHROPLASTY Right 01/28/2023   Procedure: TOTAL KNEE ARTHROPLASTY;  Surgeon: Durene Romans, MD;  Location: [facility];  Service: Orthopedics;  Laterality: Right;   UPPER GASTROINTESTINAL ENDOSCOPY     Social History   Social History Narrative   Occupation: Sports coach   Daily Caffeine use- 1 cup daily      Immunization History  Administered Date(s) Administered   COVID-19, mRNA, vaccine(Comirnaty)12 years and older 09/03/2022   Influenza Whole 06/22/2010   PFIZER(Purple Top)SARS-COV-2 Vaccination 11/12/2019, 12/07/2019, 09/14/2020   Pfizer Covid-19 Vaccine Bivalent Booster 54yrs & up 10/05/2021   Tdap 03/16/2014     Objective: Vital Signs: There were no vitals taken for this visit.   Physical Exam   Musculoskeletal Exam: ***  CDAI Exam: CDAI Score: -- Patient Global: --; Provider Global: -- Swollen: --; Tender: -- Joint Exam 05/27/2023   No joint exam has been documented for this visit   There is currently no information documented on the homunculus. Go to the Rheumatology activity and complete the homunculus joint exam.  Investigation: No additional findings.  Imaging: No results found.  Recent Labs: Lab Results  Component Value Date   WBC 3.9 (L) 02/20/2023   HGB 11.4 (L) 02/20/2023   PLT 523.0 (H) 02/20/2023   NA 139 02/20/2023   K 4.3 02/20/2023   CL 103 02/20/2023   CO2 27 02/20/2023   GLUCOSE 87 02/20/2023   BUN 10 02/20/2023   CREATININE 0.62 02/20/2023   BILITOT 0.6 02/20/2023   ALKPHOS 71 02/20/2023   AST 15 02/20/2023   ALT 9 02/20/2023   PROT 7.0 02/20/2023   ALBUMIN 4.0 02/20/2023   CALCIUM 9.8 02/20/2023   GFRAA >60 04/06/2020   QFTBGOLDPLUS NEGATIVE 11/17/2017    Speciality Comments: No specialty comments  available.  Procedures:  No procedures  performed Allergies: Aspirin, Codeine, Morphine, Norvasc [amlodipine], Sulfa antibiotics, and Sulfonamide derivatives   Assessment / Plan:     Visit Diagnoses: No diagnosis found.  Orders: No orders of the defined types were placed in this encounter.  No orders of the defined types were placed in this encounter.   Face-to-face time spent with patient was *** minutes. Greater than 50% of time was spent in counseling and coordination of care.  Follow-Up Instructions: No follow-ups on file.   Pollyann Savoy, MD  Note - This record has been created using Animal nutritionist.  Chart creation errors have been sought, but may not always  have been located. Such creation errors do not reflect on  the standard of medical care.

## 2023-05-15 ENCOUNTER — Encounter (INDEPENDENT_AMBULATORY_CARE_PROVIDER_SITE_OTHER): Payer: Self-pay

## 2023-05-27 ENCOUNTER — Encounter: Payer: Medicare Other | Admitting: Rheumatology

## 2023-05-27 DIAGNOSIS — M170 Bilateral primary osteoarthritis of knee: Secondary | ICD-10-CM

## 2023-05-27 DIAGNOSIS — K219 Gastro-esophageal reflux disease without esophagitis: Secondary | ICD-10-CM

## 2023-05-27 DIAGNOSIS — J302 Other seasonal allergic rhinitis: Secondary | ICD-10-CM

## 2023-05-27 DIAGNOSIS — G4733 Obstructive sleep apnea (adult) (pediatric): Secondary | ICD-10-CM

## 2023-05-27 DIAGNOSIS — Z7952 Long term (current) use of systemic steroids: Secondary | ICD-10-CM

## 2023-05-27 DIAGNOSIS — E782 Mixed hyperlipidemia: Secondary | ICD-10-CM

## 2023-05-27 DIAGNOSIS — E669 Obesity, unspecified: Secondary | ICD-10-CM

## 2023-05-27 DIAGNOSIS — M542 Cervicalgia: Secondary | ICD-10-CM

## 2023-05-27 DIAGNOSIS — G8929 Other chronic pain: Secondary | ICD-10-CM

## 2023-05-27 DIAGNOSIS — Z96651 Presence of right artificial knee joint: Secondary | ICD-10-CM

## 2023-05-27 DIAGNOSIS — K588 Other irritable bowel syndrome: Secondary | ICD-10-CM

## 2023-05-27 DIAGNOSIS — Z8669 Personal history of other diseases of the nervous system and sense organs: Secondary | ICD-10-CM

## 2023-05-27 DIAGNOSIS — I10 Essential (primary) hypertension: Secondary | ICD-10-CM

## 2023-05-27 DIAGNOSIS — I2510 Atherosclerotic heart disease of native coronary artery without angina pectoris: Secondary | ICD-10-CM

## 2023-05-27 DIAGNOSIS — M19041 Primary osteoarthritis, right hand: Secondary | ICD-10-CM

## 2023-05-27 DIAGNOSIS — Z9884 Bariatric surgery status: Secondary | ICD-10-CM

## 2023-05-27 DIAGNOSIS — Z96642 Presence of left artificial hip joint: Secondary | ICD-10-CM

## 2023-05-27 DIAGNOSIS — M450 Ankylosing spondylitis of multiple sites in spine: Secondary | ICD-10-CM

## 2023-06-03 ENCOUNTER — Ambulatory Visit: Payer: Medicare Other | Admitting: Family Medicine

## 2023-06-03 ENCOUNTER — Encounter: Payer: Self-pay | Admitting: Family Medicine

## 2023-06-03 ENCOUNTER — Ambulatory Visit (INDEPENDENT_AMBULATORY_CARE_PROVIDER_SITE_OTHER): Payer: Medicare Other | Admitting: Family Medicine

## 2023-06-03 VITALS — BP 130/80 | HR 84 | Temp 98.2°F | Resp 18 | Ht 62.0 in | Wt 171.6 lb

## 2023-06-03 DIAGNOSIS — I10 Essential (primary) hypertension: Secondary | ICD-10-CM | POA: Diagnosis not present

## 2023-06-03 DIAGNOSIS — T7840XA Allergy, unspecified, initial encounter: Secondary | ICD-10-CM | POA: Diagnosis not present

## 2023-06-03 DIAGNOSIS — E785 Hyperlipidemia, unspecified: Secondary | ICD-10-CM

## 2023-06-03 MED ORDER — TRIAMCINOLONE ACETONIDE 0.1 % EX CREA
1.0000 | TOPICAL_CREAM | Freq: Two times a day (BID) | CUTANEOUS | 0 refills | Status: DC
Start: 2023-06-03 — End: 2023-10-14

## 2023-06-03 NOTE — Progress Notes (Signed)
Established Patient Office Visit  Subjective   Patient ID: [name], female    DOB: September 20, 1956  Age: 67 y.o. MRN: [id number]  Chief Complaint  Patient presents with   Hypertension   Follow-up    HPI Discussed the use of AI scribe software for clinical note transcription with the patient, who gave verbal consent to proceed.  History of Present Illness   The patient, with a history of obesity, hypertension, hyperlipidemia, and recent knee surgery, presents for follow-up. She has successfully lost weight through dietary changes and exercise, including cutting back on late-night eating and daily cycling. She reports improved mobility in her knee and has transitioned from physical therapy to gym workouts. However, she has developed a new issue with her knee after using Scar Away strips, which she believes she had an allergic reaction to. The knee is itchy, feels internally swollen, and the nerve pain she had been experiencing has flared up again. She also reports intermittent use of her prescribed cholesterol medication and fluid pill, the latter of which she only takes when she experiences swelling.      Patient Active Problem List   Diagnosis Date Noted   Hyperlipidemia 02/20/2023   Edema of right lower extremity 02/20/2023   Leukocytes in urine 02/20/2023   S/P total knee arthroplasty, right 01/28/2023   ASCVD (arteriosclerotic cardiovascular disease) 12/23/2022   Preventative health care 10/01/2022   Elevated serum creatinine 06/26/2021   COVID-19 04/26/2021   Diverticulitis 02/20/2021   Depression 07/10/2020   History of gastric bypass 07/10/2020   Has daytime drowsiness 05/09/2020   Other fatigue 02/29/2020   Short of breath on exertion 02/29/2020   Vitamin D deficiency 08/11/2018   Gastric bypass status for obesity 07/30/2018   Hives 06/07/2018   Primary osteoarthritis of right knee 01/23/2018   Primary osteoarthritis of both hands 01/23/2018   History of iritis  11/28/2017   History of total hip replacement, left 11/28/2017   Primary osteoarthritis of both knees 11/28/2017   Obstructive sleep apnea 03/08/2016   Seasonal allergies 05/14/2015   Obesity (BMI 30-39.9) 03/16/2014   Long-term current use of steroids 04/20/2013   Noninfectious gastroenteritis and colitis 03/17/2013   Calf pain 10/13/2012   Acute sprain or strain of cervical region 04/10/2012   LEG CRAMPS, NOCTURNAL 11/14/2010   OTHER SPECIFIED DISEASE OF WHITE BLOOD CELLS 07/23/2010   CONSTIPATION 04/06/2010   CORNEAL ABRASION, LEFT 01/22/2010   CHEST PAIN UNSPECIFIED 10/11/2009   BLOOD IN STOOL 12/02/2008   Ankylosing spondylitis (HCC) 03/31/2008   MUSCLE PAIN 03/31/2008   BARIATRIC SURGERY STATUS 01/14/2008   THYROID CYST 06/26/2007   Class 2 severe obesity with serious comorbidity and body mass index (BMI) of 36.0 to 36.9 in adult [facility]) 06/26/2007   Essential hypertension 06/26/2007   GERD 06/26/2007   IBS 06/26/2007   LYMPHADENOPATHY 06/10/2007   History of thyroid nodule 06/10/2007   Past Medical History:  Diagnosis Date   Abdominal pain, other specified site 03/31/2008   Centricity Description: FLANK PAIN, RIGHT Qualifier: Diagnosis of  By: Alwyn Ren MD, William   Centricity Description: ABDOMINAL PAIN OTHER SPECIFIED SITE Qualifier: Diagnosis of  By: Janit Bern     Acute sprain or strain of cervical region 04/10/2012   ALLERGIC RHINITIS 06/26/2007   Qualifier: Diagnosis of  By: Charlsie Quest RMA, Lucy     ANKYLOSING SPONDYLITIS 03/31/2008   Qualifier: Diagnosis of  By: Alwyn Ren MD, William     Ankylosing spondylitis (HCC)    neck and  spin   Ankylosing spondylitis of multiple sites in spine [facility])    Arthritis    Back pain    Bariatric surgery status 01/14/2008   Qualifier: Diagnosis of  By: Laury Axon DO, Myrene Buddy     Blood in stool 12/02/2008   Qualifier: Diagnosis of  By: Janit Bern     Blood transfusion without reported diagnosis    Calf pain 10/13/2012   CHEST PAIN  UNSPECIFIED 10/11/2009   Qualifier: Diagnosis of  By: Arlyce Dice MD, Barbette Hair    CONSTIPATION 04/06/2010   Qualifier: Diagnosis of  By: Janit Bern     CORNEAL ABRASION, LEFT 01/22/2010   Qualifier: Diagnosis of  By: Janit Bern     Gastritis    GERD 06/26/2007   Qualifier: Diagnosis of  By: Charlsie Quest RMA, Lucy     GERD (gastroesophageal reflux disease)    Hiatal hernia    Hyperlipidemia    Hypertension    no medications now   IBS 06/26/2007   Qualifier: Diagnosis of  By: Charlsie Quest RMA, Lucy     Joint pain    Lactose intolerance    LEG CRAMPS, NOCTURNAL 11/14/2010   Qualifier: Diagnosis of  By: Floydene Flock     Long-term current use of steroids 04/20/2013   LYMPHADENOPATHY 06/10/2007   Qualifier: Diagnosis of  By: Janit Bern     MORBID OBESITY 06/26/2007   Qualifier: Diagnosis of  By: Samara Snide     MUSCLE PAIN 03/31/2008   Qualifier: Diagnosis of  By: Alwyn Ren MD, William     Noninfectious gastroenteritis and colitis 03/17/2013   Obesity (BMI 30-39.9) 03/16/2014   OSA (obstructive sleep apnea) 03/08/2016   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/23/2010   Qualifier: Diagnosis of  By: Janit Bern     Rheumatoid arthritis (HCC)    Seasonal allergies 05/14/2015   THYROID CYST 06/26/2007   Qualifier: Diagnosis of  By: Charlsie Quest RMA, Lucy     THYROID NODULE, HX OF 06/10/2007   Qualifier: Diagnosis of  By: Janit Bern     Vitamin D deficiency    Past Surgical History:  Procedure Laterality Date   BARIATRIC SURGERY     BREAST REDUCTION SURGERY  1996   COLONOSCOPY  10/24/2009   kaplan-normal exam   ESOPHAGOGASTRODUODENOSCOPY     pelvis replacement  2010   TOTAL HIP ARTHROPLASTY     x 2 left, one revision   TOTAL KNEE ARTHROPLASTY Right 01/28/2023   Procedure: TOTAL KNEE ARTHROPLASTY;  Surgeon: Durene Romans, MD;  Location: [facility];  Service: Orthopedics;  Laterality: Right;   UPPER GASTROINTESTINAL ENDOSCOPY     Social History   Tobacco Use    Smoking status: Never   Smokeless tobacco: Never  Vaping Use   Vaping status: Never Used  Substance Use Topics   Alcohol use: No    Alcohol/week: 0.0 standard drinks of alcohol   Drug use: No   Social History   Socioeconomic History   Marital status: Married    Spouse name: Adama Corea   Number of children: Not on file   Years of education: Not on file   Highest education level: Master's degree (e.g., MA, MS, MEng, MEd, MSW, MBA)  Occupational History   Occupation: retired  Tobacco Use   Smoking status: Never   Smokeless tobacco: Never  Vaping Use   Vaping status: Never Used  Substance and Sexual Activity   Alcohol use: No    Alcohol/week: 0.0  standard drinks of alcohol   Drug use: No   Sexual activity: Yes  Other Topics Concern   Not on file  Social History Narrative   Occupation: Sports coach   Daily Caffeine use- 1 cup daily      Social Determinants of Health   Financial Resource Strain: Low Risk  (02/20/2023)   Overall Financial Resource Strain (CARDIA)    Difficulty of Paying Living Expenses: Not hard at all  Food Insecurity: No Food Insecurity (02/20/2023)   Hunger Vital Sign    Worried About Running Out of Food in the Last Year: Never true    Ran Out of Food in the Last Year: Never true  Transportation Needs: No Transportation Needs (02/20/2023)   PRAPARE - Administrator, Civil Service (Medical): No    Lack of Transportation (Non-Medical): No  Physical Activity: Inactive (02/20/2023)   Exercise Vital Sign    Days of Exercise per Week: 0 days    Minutes of Exercise per Session: 0 min  Stress: No Stress Concern Present (02/20/2023)   [employer] of Occupational Health - Occupational Stress Questionnaire    Feeling of Stress : Not at all  Social Connections: Socially Integrated (02/20/2023)   Social Connection and Isolation Panel [NHANES]    Frequency of Communication with Friends and Family: More than three times a week    Frequency of  Social Gatherings with Friends and Family: More than three times a week    Attends Religious Services: More than 4 times per year    Active Member of [employer] or Organizations: Yes    Attends Engineer, structural: More than 4 times per year    Marital Status: Married  Catering manager Violence: Not At Risk (01/28/2023)   Humiliation, Afraid, Rape, and Kick questionnaire    Fear of Current or Ex-Partner: No    Emotionally Abused: No    Physically Abused: No    Sexually Abused: No   Family Status  Relation Name Status   Mother  Alive   Father  Alive   Sister  Alive   MGM  (Not Specified)   Neg Hx  (Not Specified)  No partnership data on file   Family History  Problem Relation Age of Onset   Hypertension Mother    High Cholesterol Mother    Diabetes Mother    Bladder Cancer Mother    Arthritis Father    Hypertension Father    Diabetes Father    High Cholesterol Father    Hypertension Sister    Diabetes Sister    Melanoma Maternal Grandmother    Breast cancer Maternal Grandmother        and Aunt   Colon cancer Neg Hx    Esophageal cancer Neg Hx    Rectal cancer Neg Hx    Stomach cancer Neg Hx    Colon polyps Neg Hx    Allergies  Allergen Reactions   Aspirin     REACTION: intolerance-gi upset   Codeine Itching   Morphine Itching   Norvasc [Amlodipine]    Sulfa Antibiotics Other (See Comments)    Unsure of reaction   Sulfonamide Derivatives     Unsure of reaction      Review of Systems  Constitutional:  Negative for fever and malaise/fatigue.  HENT:  Negative for congestion.   Eyes:  Negative for blurred vision.  Respiratory:  Negative for cough and shortness of breath.   Cardiovascular:  Negative for chest pain, palpitations and  leg swelling.  Gastrointestinal:  Negative for vomiting.  Musculoskeletal:  Negative for back pain.  Skin:  Negative for rash.  Neurological:  Negative for loss of consciousness and headaches.      Objective:     BP  130/80 (BP Location: Left Arm, Patient Position: Sitting, Cuff Size: Large)   Pulse 84   Temp 98.2 F (36.8 C) (Oral)   Resp 18   Ht 5\' 2"  (1.575 m)   Wt 171 lb 9.6 oz (77.8 kg)   SpO2 99%   BMI 31.39 kg/m  BP Readings from Last 3 Encounters:  06/03/23 130/80  02/20/23 136/80  01/31/23 139/78   Wt Readings from Last 3 Encounters:  06/03/23 171 lb 9.6 oz (77.8 kg)  02/20/23 190 lb 6.4 oz (86.4 kg)  01/28/23 185 lb 3.2 oz (84 kg)   SpO2 Readings from Last 3 Encounters:  06/03/23 99%  02/20/23 98%  01/31/23 100%      Physical Exam Vitals and nursing note reviewed.  Constitutional:      General: She is not in acute distress.    Appearance: Normal appearance. She is well-developed.  HENT:     Head: Normocephalic and atraumatic.  Eyes:     General: No scleral icterus.       Right eye: No discharge.        Left eye: No discharge.  Cardiovascular:     Rate and Rhythm: Normal rate and regular rhythm.     Heart sounds: No murmur heard. Pulmonary:     Effort: Pulmonary effort is normal. No respiratory distress.     Breath sounds: Normal breath sounds.  Musculoskeletal:        General: Normal range of motion.     Cervical back: Normal range of motion and neck supple.     Right lower leg: No edema.     Left lower leg: No edema.  Skin:    General: Skin is warm and dry.  Neurological:     Mental Status: She is alert and oriented to person, place, and time.  Psychiatric:        Mood and Affect: Mood normal.        Behavior: Behavior normal.        Thought Content: Thought content normal.        Judgment: Judgment normal.     No results found for any visits on 06/03/23.    The 10-year ASCVD risk score (Arnett DK, et al., 2019) is: 6.4%    Assessment & Plan:   Problem List Items Addressed This Visit       Unprioritized   Hyperlipidemia - Primary   Relevant Orders   CBC with Differential/Platelet   Comprehensive metabolic panel   Lipid panel   TSH    Lipoprotein A (LPA)   Essential hypertension   Relevant Orders   CBC with Differential/Platelet   Comprehensive metabolic panel   Lipid panel   TSH   Other Visit Diagnoses     Allergic reaction, initial encounter       Relevant Medications   triamcinolone cream (KENALOG) 0.1 %     Discussed the use of AI scribe software for clinical note transcription with the patient, who gave verbal consent to proceed.  History of Present Illness   The patient, with a history of obesity, hypertension, hyperlipidemia, and recent knee surgery, presents for follow-up. She has successfully lost weight through dietary changes and exercise, including cutting back on late-night eating and daily cycling.  She reports improved mobility in her knee and has transitioned from physical therapy to gym workouts. However, she has developed a new issue with her knee after using Scar Away strips, which she believes she had an allergic reaction to. The knee is itchy, feels internally swollen, and the nerve pain she had been experiencing has flared up again. She also reports intermittent use of her prescribed cholesterol medication and fluid pill, the latter of which she only takes when she experiences swelling.       No follow-ups on file.    Donato Schultz, DO

## 2023-06-04 ENCOUNTER — Ambulatory Visit: Payer: Medicare Other

## 2023-06-04 LAB — LIPID PANEL
Cholesterol: 158 mg/dL (ref 0–200)
HDL: 82 mg/dL (ref >39.00)
LDL Cholesterol: 64 mg/dL (ref 0–99)
NonHDL: 76.02
Total CHOL/HDL Ratio: 2
Triglycerides: 58 mg/dL (ref 0.0–149.0)
VLDL: 11.6 mg/dL (ref 0.0–40.0)

## 2023-06-04 LAB — COMPREHENSIVE METABOLIC PANEL
ALT: 15 U/L (ref 0–35)
AST: 19 U/L (ref 0–37)
Albumin: 4.2 g/dL (ref 3.5–5.2)
Alkaline Phosphatase: 91 U/L (ref 39–117)
BUN: 14 mg/dL (ref 6–23)
CO2: 31 meq/L (ref 19–32)
Calcium: 10.2 mg/dL (ref 8.4–10.5)
Chloride: 102 meq/L (ref 96–112)
Creatinine, Ser: 0.62 mg/dL (ref 0.40–1.20)
GFR: 93.91 mL/min (ref >60.00)
Glucose, Bld: 67 mg/dL — ABNORMAL LOW (ref 70–99)
Potassium: 4.3 meq/L (ref 3.5–5.1)
Sodium: 140 meq/L (ref 135–145)
Total Bilirubin: 0.5 mg/dL (ref 0.2–1.2)
Total Protein: 7.3 g/dL (ref 6.0–8.3)

## 2023-06-04 LAB — CBC WITH DIFFERENTIAL/PLATELET
Basophils Absolute: 0.1 10*3/uL (ref 0.0–0.1)
Basophils Relative: 1.3 % (ref 0.0–3.0)
Eosinophils Absolute: 0.1 10*3/uL (ref 0.0–0.7)
Eosinophils Relative: 1.8 % (ref 0.0–5.0)
HCT: 43.5 % (ref 36.0–46.0)
Hemoglobin: 13.8 g/dL (ref 12.0–15.0)
Lymphocytes Relative: 48.4 % — ABNORMAL HIGH (ref 12.0–46.0)
Lymphs Abs: 2.6 10*3/uL (ref 0.7–4.0)
MCHC: 31.8 g/dL (ref 30.0–36.0)
MCV: 95 fl (ref 78.0–100.0)
Monocytes Absolute: 0.6 10*3/uL (ref 0.1–1.0)
Monocytes Relative: 11.5 % (ref 3.0–12.0)
Neutro Abs: 1.9 10*3/uL (ref 1.4–7.7)
Neutrophils Relative %: 37 % — ABNORMAL LOW (ref 43.0–77.0)
Platelets: 362 10*3/uL (ref 150.0–400.0)
RBC: 4.58 Mil/uL (ref 3.87–5.11)
RDW: 14.4 % (ref 11.5–15.5)
WBC: 5.3 10*3/uL (ref 4.0–10.5)

## 2023-06-04 LAB — TSH: TSH: 2.04 u[IU]/mL (ref 0.35–5.50)

## 2023-06-05 LAB — LIPOPROTEIN A (LPA): Lipoprotein (a): 93 nmol/L — ABNORMAL HIGH (ref <75)

## 2023-06-10 DIAGNOSIS — M25569 Pain in unspecified knee: Secondary | ICD-10-CM | POA: Diagnosis not present

## 2023-06-12 DIAGNOSIS — M8588 Other specified disorders of bone density and structure, other site: Secondary | ICD-10-CM | POA: Diagnosis not present

## 2023-06-12 DIAGNOSIS — Z1231 Encounter for screening mammogram for malignant neoplasm of breast: Secondary | ICD-10-CM | POA: Diagnosis not present

## 2023-06-12 LAB — HM DEXA SCAN

## 2023-06-13 MED ORDER — ROSUVASTATIN CALCIUM 20 MG PO TABS: 20.0000 mg | ORAL_TABLET | Freq: Every day | ORAL | 5 refills | Status: DC

## 2023-06-13 NOTE — Addendum Note (Signed)
Addended by: Wilford Corner on: 06/13/2023 04:38 PM   Modules accepted: Orders

## 2023-06-19 ENCOUNTER — Ambulatory Visit: Payer: Medicare Other | Admitting: Rheumatology

## 2023-06-24 ENCOUNTER — Ambulatory Visit (INDEPENDENT_AMBULATORY_CARE_PROVIDER_SITE_OTHER): Payer: Medicare Other | Admitting: *Deleted

## 2023-06-24 VITALS — BP 118/84 | Ht 62.0 in | Wt 171.0 lb

## 2023-06-24 DIAGNOSIS — Z0000 Encounter for general adult medical examination without abnormal findings: Secondary | ICD-10-CM | POA: Diagnosis not present

## 2023-06-24 NOTE — Progress Notes (Signed)
Subjective:   [name] is a 67 y.o. female who presents for Medicare Annual (Subsequent) preventive examination.  Visit Complete: Virtual  I connected with  [name] on 06/24/23 by a audio enabled telemedicine application and verified that I am speaking with the correct person using two identifiers.  Patient Location: Home  Provider Location: Office/Clinic  I discussed the limitations of evaluation and management by telemedicine. The patient expressed understanding and agreed to proceed.   Cardiac Risk Factors include: advanced age (>15men, >15 women);dyslipidemia;hypertension;obesity (BMI >30kg/m2)     Objective:   Vital Signs: Vital signs are patient reported.  Today's Vitals   06/24/23 1309  BP: 118/84  Weight: 171 lb (77.6 kg)  Height: 5\' 2"  (1.575 m)   Body mass index is 31.28 kg/m.     06/24/2023    1:08 PM 01/28/2023   11:20 AM 01/28/2023    5:59 AM 01/15/2023    8:02 AM 06/17/2022    9:33 AM 07/25/2021   12:03 PM 05/01/2021    8:23 AM  Advanced Directives  Does Patient Have a Medical Advance Directive? No No  No No No No  Would patient like information on creating a medical advance directive? No - Patient declined No - Patient declined No - Patient declined No - Patient declined  Yes (ED - Information included in AVS) Yes (MAU/Ambulatory/Procedural Areas - Information given)    Current Medications (verified) Outpatient Encounter Medications as of 06/24/2023  Medication Sig   bariatric multiple vitamin (ADVANCED MULTI EA) CHEW chewable tablet Chew 2 tablets by mouth daily.   Biotin w/ Vitamins C & E (HAIR SKIN & NAILS GUMMIES PO) Take 1 tablet by mouth daily.   Calcium Carb-Cholecalciferol (CALTRATE 600+D3 SOFT PO) Take 1 tablet by mouth daily.   chlorthalidone (HYGROTON) 25 MG tablet Take 0.5 tablets (12.5 mg total) by mouth daily.   cyclobenzaprine (FLEXERIL) 5 MG tablet Take 1 tablet (5 mg total) by mouth 3 (three) times daily as needed for muscle  spasms.   esomeprazole (NEXIUM) 40 MG capsule Take 1 capsule (40 mg total) by mouth daily as needed. Crack open capsule and ingest contents. Please keep your January appointment for further refills. Thank you   hydrOXYzine (ATARAX) 10 MG tablet Take 1 tablet (10 mg total) by mouth 3 (three) times daily as needed for itching.   lisinopril (ZESTRIL) 10 MG tablet Take 1 tablet (10 mg total) by mouth daily.   ondansetron (ZOFRAN) 4 MG tablet Take 1 tablet (4 mg total) by mouth every 6 (six) hours as needed for nausea.   polyethylene glycol (MIRALAX / GLYCOLAX) 17 g packet Take 17 g by mouth 2 (two) times daily.   rivaroxaban (XARELTO) 10 MG TABS tablet Take 1 tablet (10 mg total) by mouth daily with breakfast for 14 days.   rosuvastatin (CRESTOR) 20 MG tablet Take 1 tablet (20 mg total) by mouth daily.   sucralfate (CARAFATE) 1 g tablet Take 1 g by mouth 4 (four) times daily -  with meals and at bedtime.   triamcinolone cream (KENALOG) 0.1 % Apply 1 Application topically 2 (two) times daily.   No facility-administered encounter medications on file as of 06/24/2023.    Allergies (verified) Aspirin, Codeine, Morphine, Norvasc [amlodipine], Sulfa antibiotics, and Sulfonamide derivatives   History: Past Medical History:  Diagnosis Date   Abdominal pain, other specified site 03/31/2008   Centricity Description: FLANK PAIN, RIGHT Qualifier: Diagnosis of  By: Alwyn Ren MD, Lavella Lemons Description:  ABDOMINAL PAIN OTHER SPECIFIED SITE Qualifier: Diagnosis of  By: Janit Bern     Acute sprain or strain of cervical region 04/10/2012   ALLERGIC RHINITIS 06/26/2007   Qualifier: Diagnosis of  By: Charlsie Quest RMA, Lucy     ANKYLOSING SPONDYLITIS 03/31/2008   Qualifier: Diagnosis of  By: Alwyn Ren MD, Chrissie Noa     Ankylosing spondylitis (HCC)    neck and spin   Ankylosing spondylitis of multiple sites in spine [facility])    Arthritis    Back pain    Bariatric surgery status 01/14/2008   Qualifier: Diagnosis  of  By: Janit Bern     Blood in stool 12/02/2008   Qualifier: Diagnosis of  By: Janit Bern     Blood transfusion without reported diagnosis    Calf pain 10/13/2012   CHEST PAIN UNSPECIFIED 10/11/2009   Qualifier: Diagnosis of  By: Arlyce Dice MD, Barbette Hair    CONSTIPATION 04/06/2010   Qualifier: Diagnosis of  By: Janit Bern     CORNEAL ABRASION, LEFT 01/22/2010   Qualifier: Diagnosis of  By: Janit Bern     Gastritis    GERD 06/26/2007   Qualifier: Diagnosis of  By: Charlsie Quest RMA, Lucy     GERD (gastroesophageal reflux disease)    Hiatal hernia    Hyperlipidemia    Hypertension    no medications now   IBS 06/26/2007   Qualifier: Diagnosis of  By: Charlsie Quest RMA, Lucy     Joint pain    Lactose intolerance    LEG CRAMPS, NOCTURNAL 11/14/2010   Qualifier: Diagnosis of  By: Floydene Flock     Long-term current use of steroids 04/20/2013   LYMPHADENOPATHY 06/10/2007   Qualifier: Diagnosis of  By: Janit Bern     MORBID OBESITY 06/26/2007   Qualifier: Diagnosis of  By: Samara Snide     MUSCLE PAIN 03/31/2008   Qualifier: Diagnosis of  By: Alwyn Ren MD, William     Noninfectious gastroenteritis and colitis 03/17/2013   Obesity (BMI 30-39.9) 03/16/2014   OSA (obstructive sleep apnea) 03/08/2016   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/23/2010   Qualifier: Diagnosis of  By: Janit Bern     Rheumatoid arthritis (HCC)    Seasonal allergies 05/14/2015   THYROID CYST 06/26/2007   Qualifier: Diagnosis of  By: Charlsie Quest RMA, Lucy     THYROID NODULE, HX OF 06/10/2007   Qualifier: Diagnosis of  By: Janit Bern     Vitamin D deficiency    Past Surgical History:  Procedure Laterality Date   BARIATRIC SURGERY     BREAST REDUCTION SURGERY  1996   COLONOSCOPY  10/24/2009   kaplan-normal exam   ESOPHAGOGASTRODUODENOSCOPY     pelvis replacement  2010   TOTAL HIP ARTHROPLASTY     x 2 left, one revision   TOTAL KNEE ARTHROPLASTY Right 01/28/2023   Procedure:  TOTAL KNEE ARTHROPLASTY;  Surgeon: Durene Romans, MD;  Location: [facility];  Service: Orthopedics;  Laterality: Right;   UPPER GASTROINTESTINAL ENDOSCOPY     Family History  Problem Relation Age of Onset   Hypertension Mother    High Cholesterol Mother    Diabetes Mother    Bladder Cancer Mother    Arthritis Father    Hypertension Father    Diabetes Father    High Cholesterol Father    Hypertension Sister    Diabetes Sister    Melanoma Maternal Grandmother    Breast cancer Maternal Grandmother  and Aunt   Colon cancer Neg Hx    Esophageal cancer Neg Hx    Rectal cancer Neg Hx    Stomach cancer Neg Hx    Colon polyps Neg Hx    Social History   Socioeconomic History   Marital status: Married    Spouse name: Zimal Biedron   Number of children: Not on file   Years of education: Not on file   Highest education level: Master's degree (e.g., MA, MS, MEng, MEd, MSW, MBA)  Occupational History   Occupation: retired  Tobacco Use   Smoking status: Never   Smokeless tobacco: Never  Vaping Use   Vaping status: Never Used  Substance and Sexual Activity   Alcohol use: No    Alcohol/week: 0.0 standard drinks of alcohol   Drug use: No   Sexual activity: Yes  Other Topics Concern   Not on file  Social History Narrative   Occupation: Sports coach   Daily Caffeine use- 1 cup daily      Social Determinants of Health   Financial Resource Strain: Low Risk  (02/20/2023)   Overall Financial Resource Strain (CARDIA)    Difficulty of Paying Living Expenses: Not hard at all  Food Insecurity: No Food Insecurity (02/20/2023)   Hunger Vital Sign    Worried About Running Out of Food in the Last Year: Never true    Ran Out of Food in the Last Year: Never true  Transportation Needs: No Transportation Needs (02/20/2023)   PRAPARE - Administrator, Civil Service (Medical): No    Lack of Transportation (Non-Medical): No  Physical Activity: Inactive (02/20/2023)   Exercise  Vital Sign    Days of Exercise per Week: 0 days    Minutes of Exercise per Session: 0 min  Stress: No Stress Concern Present (02/20/2023)   [employer] of Occupational Health - Occupational Stress Questionnaire    Feeling of Stress : Not at all  Social Connections: Unknown (06/06/2023)   Received from [facility]   Social Network    Social Network: Not on file    Tobacco Counseling Counseling given: Not Answered   Clinical Intake:  Pre-visit preparation completed: Yes  Pain : No/denies pain  BMI - recorded: 31.28 Nutritional Status: BMI > 30  Obese Nutritional Risks: None Diabetes: No  How often do you need to have someone help you when you read instructions, pamphlets, or other written materials from your doctor or pharmacy?: 1 - Never  Interpreter Needed?: No  Information entered by :: Donne Anon, CMA   Activities of Daily Living    06/24/2023    1:02 PM 01/28/2023   11:20 AM  In your present state of health, do you have any difficulty performing the following activities:  Hearing? 0 0  Vision? 0 0  Difficulty concentrating or making decisions? 1 0  Comment memory   Walking or climbing stairs? 1 0  Dressing or bathing? 0 0  Doing errands, shopping? 0 0  Preparing Food and eating ? N   Using the Toilet? N   In the past six months, have you accidently leaked urine? N   Do you have problems with loss of bowel control? N   Managing your Medications? N   Managing your Finances? N   Housekeeping or managing your Housekeeping? N     Patient Care Team: Zola Button, Grayling Congress, DO as PCP - [name] Candice Camp, MD as Consulting Physician (Obstetrics and Gynecology) Charlie Pitter, MD as  Referring Physician (Surgery) Pollyann Savoy, MD as Consulting Physician (Rheumatology) Jake Bathe, MD as Consulting Physician (Cardiology) Armbruster, Willaim Rayas, MD as Consulting Physician (Gastroenterology) Carlus Pavlov, MD as Consulting Physician  (Endocrinology) Phipps, C. Onalee Hua, MD (Otolaryngology) Durene Romans, MD as Consulting Physician (Orthopedic Surgery) Waymon Budge, MD as Consulting Physician (Pulmonary Disease)  Indicate any recent Medical Services you may have received from other than [facility] providers in the past year (date may be approximate).     Assessment:   This is a routine wellness examination for [place].  Hearing/Vision screen No results found.   Goals Addressed   None    Depression Screen    06/24/2023    1:04 PM 02/20/2023    8:31 AM 10/01/2022   10:56 AM 06/17/2022    9:35 AM 05/01/2021    8:25 AM 02/20/2021    6:57 AM 02/29/2020   10:17 AM  PHQ 2/9 Scores  PHQ - 2 Score 0 0 0 0 0 0 3  PHQ- 9 Score  0 0   6 11    Fall Risk    06/24/2023    1:06 PM 06/17/2022    9:34 AM 05/01/2021    8:24 AM 06/08/2019    8:08 AM 02/09/2018    8:25 AM  Fall Risk   Falls in the past year? 0 0 0 1 Yes  Number falls in past yr: 0 0 0 1 2 or more  Injury with Fall? 0 0 0 0 No  Risk for fall due to : No Fall Risks   -- History of fall(s)  Risk for fall due to: Comment    slipped x2   Follow up Falls evaluation completed Falls prevention discussed Falls prevention discussed Education provided;Falls prevention discussed Education provided;Falls prevention discussed    MEDICARE RISK AT HOME: Medicare Risk at Home Any stairs in or around the home?: Yes If so, are there any without handrails?: No Home free of loose throw rugs in walkways, pet beds, electrical cords, etc?: Yes Adequate lighting in your home to reduce risk of falls?: Yes Life alert?: No Use of a cane, walker or w/c?: Yes (cane sometimes) Grab bars in the bathroom?: Yes (in the shower) Shower chair or bench in shower?: No Elevated toilet seat or a handicapped toilet?: Yes (comfort height)  TIMED UP AND GO:  Was the test performed?  No    Cognitive Function:    03/05/2016   10:05 AM  MMSE - Mini Mental State Exam  Orientation to time 5   Orientation to Place 5  Registration 3  Attention/ Calculation 5  Recall 3  Language- name 2 objects 2  Language- repeat 1  Language- follow 3 step command 3  Language- read & follow direction 1  Write a sentence 1  Copy design 1  Total score 30        06/24/2023    1:10 PM 06/17/2022    9:40 AM 06/08/2019    8:13 AM  6CIT Screen  What Year? 0 points 0 points 0 points  What month? 0 points 0 points 0 points  What time? 0 points 0 points 0 points  Count back from 20 0 points 0 points 0 points  Months in reverse 0 points 0 points 0 points  Repeat phrase 0 points 0 points 4 points  Total Score 0 points 0 points 4 points    Immunizations Immunization History  Administered Date(s) Administered   Influenza Whole 06/22/2010   PFIZER(Purple Top)SARS-COV-2  Vaccination 11/12/2019, 12/07/2019, 09/14/2020   Pfizer Covid-19 Vaccine Bivalent Booster 5yrs & up 10/05/2021   Pfizer(Comirnaty)Fall Seasonal Vaccine 12 years and older 09/03/2022   Tdap 03/16/2014    TDAP status: Up to date  Flu Vaccine status: Due, Education has been provided regarding the importance of this vaccine. Advised may receive this vaccine at local pharmacy or Health Dept. Aware to provide a copy of the vaccination record if obtained from local pharmacy or Health Dept. Verbalized acceptance and understanding.  Pneumococcal vaccine status: Due, Education has been provided regarding the importance of this vaccine. Advised may receive this vaccine at local pharmacy or Health Dept. Aware to provide a copy of the vaccination record if obtained from local pharmacy or Health Dept. Verbalized acceptance and understanding.  Covid-19 vaccine status: Information provided on how to obtain vaccines.   Qualifies for Shingles Vaccine? Yes   Zostavax completed No   Shingrix Completed?: No.    Education has been provided regarding the importance of this vaccine. Patient has been advised to call insurance company to determine out  of pocket expense if they have not yet received this vaccine. Advised may also receive vaccine at local pharmacy or Health Dept. Verbalized acceptance and understanding.  Screening Tests Health Maintenance  Topic Date Due   Zoster Vaccines- Shingrix (1 of 2) Never done   Cervical Cancer Screening (HPV/Pap Cotest)  08/02/2017   Medicare Annual Wellness (AWV)  06/18/2023   INFLUENZA VACCINE  12/29/2023 (Originally 05/01/2023)   MAMMOGRAM  10/31/2023   DTaP/Tdap/Td (2 - Td or Tdap) 03/16/2024   Colonoscopy  04/13/2030   COVID-19 Vaccine  Completed   Hepatitis C Screening  Completed   HIV Screening  Completed   HPV VACCINES  Aged Out    Health Maintenance  Health Maintenance Due  Topic Date Due   Zoster Vaccines- Shingrix (1 of 2) Never done   Cervical Cancer Screening (HPV/Pap Cotest)  08/02/2017   Medicare Annual Wellness (AWV)  06/18/2023    Colorectal cancer screening: Type of screening: Colonoscopy. Completed 04/13/20. Repeat every 10 years  Mammogram status: Completed 10/30/21. Repeat every year  Bone Density status: Completed 06/12/23. Results reflect: Bone density results: OSTEOPENIA. Repeat every 2 years. Ordered by GYN  Lung Cancer Screening: (Low Dose CT Chest recommended if Age 60-80 years, 20 pack-year currently smoking OR have quit w/in 15years.) does not qualify.   Additional Screening:  Hepatitis C Screening: does qualify; Completed 11/17/17  Vision Screening: Recommended annual ophthalmology exams for early detection of glaucoma and other disorders of the eye. Is the patient up to date with their annual eye exam?  Yes  Who is the provider or what is the name of the office in which the patient attends annual eye exams? [facility] If pt is not established with a provider, would they like to be referred to a provider to establish care? No .   Dental Screening: Recommended annual dental exams for proper oral hygiene  Diabetic Foot Exam: N/a  Community Resource  Referral / Chronic Care Management: CRR required this visit?  No   CCM required this visit?  No     Plan:     I have personally reviewed and noted the following in the patient's chart:   Medical and social history Use of alcohol, tobacco or illicit drugs  Current medications and supplements including opioid prescriptions. Patient is not currently taking opioid prescriptions. Functional ability and status Nutritional status Physical activity Advanced directives List of other physicians Hospitalizations, surgeries, and  ER visits in previous 12 months Vitals Screenings to include cognitive, depression, and falls Referrals and appointments  In addition, I have reviewed and discussed with patient certain preventive protocols, quality metrics, and best practice recommendations. A written personalized care plan for preventive services as well as [name] preventive health recommendations were provided to patient.     Donne Anon, CMA   06/24/2023   After Visit Summary: (MyChart) Due to this being a telephonic visit, the after visit summary with patients personalized plan was offered to patient via MyChart   Nurse Notes: None

## 2023-06-24 NOTE — Patient Instructions (Signed)
[name] , Thank you for taking time to come for your Medicare Wellness Visit. I appreciate your ongoing commitment to your health goals. Please review the following plan we discussed and let me know if I can assist you in the future.     This is a list of the screening recommended for you and due dates:  Health Maintenance  Topic Date Due   Zoster (Shingles) Vaccine (1 of 2) Never done   Pap with HPV screening  08/02/2017   Flu Shot  12/29/2023*   Mammogram  10/31/2023   DTaP/Tdap/Td vaccine (2 - Td or Tdap) 03/16/2024   Medicare Annual Wellness Visit  06/23/2024   Colon Cancer Screening  04/13/2030   COVID-19 Vaccine  Completed   Hepatitis C Screening  Completed   HIV Screening  Completed   HPV Vaccine  Aged Out  *Topic was postponed. The date shown is not the original due date.     Next appointment: Follow up in one year for your annual wellness visit.   Preventive Care 40-64 Years, Female Preventive care refers to lifestyle choices and visits with your health care provider that can promote health and wellness. What does preventive care include? A yearly physical exam. This is also called an annual well check. Dental exams once or twice a year. Routine eye exams. Ask your health care provider how often you should have your eyes checked. Personal lifestyle choices, including: Daily care of your teeth and gums. Regular physical activity. Eating a healthy diet. Avoiding tobacco and drug use. Limiting alcohol use. Practicing safe sex. Taking low-dose aspirin daily starting at age 43. Taking vitamin and mineral supplements as recommended by your health care provider. What happens during an annual well check? The services and screenings done by your health care provider during your annual well check will depend on your age, overall health, lifestyle risk factors, and family history of disease. Counseling  Your health care provider may ask you questions about your: Alcohol  use. Tobacco use. Drug use. Emotional well-being. Home and relationship well-being. Sexual activity. Eating habits. Work and work [employer]. Method of birth control. Menstrual cycle. Pregnancy history. Screening  You may have the following tests or measurements: Height, weight, and BMI. Blood pressure. Lipid and cholesterol levels. These may be checked every 5 years, or more frequently if you are over 61 years old. Skin check. Lung cancer screening. You may have this screening every year starting at age 42 if you have a 30-pack-year history of smoking and currently smoke or have quit within the past 15 years. Fecal occult blood test (FOBT) of the stool. You may have this test every year starting at age 22. Flexible sigmoidoscopy or colonoscopy. You may have a sigmoidoscopy every 5 years or a colonoscopy every 10 years starting at age 32. Hepatitis C blood test. Hepatitis B blood test. Sexually transmitted disease (STD) testing. Diabetes screening. This is done by checking your blood sugar (glucose) after you have not eaten for a while (fasting). You may have this done every 1-3 years. Mammogram. This may be done every 1-2 years. Talk to your health care provider about when you should start having regular mammograms. This may depend on whether you have a family history of breast cancer. BRCA-related cancer screening. This may be done if you have a family history of breast, ovarian, tubal, or peritoneal cancers. Pelvic exam and Pap test. This may be done every 3 years starting at age 25. Starting at age 53, this 15  be done every 5 years if you have a Pap test in combination with an HPV test. Bone density scan. This is done to screen for osteoporosis. You may have this scan if you are at high risk for osteoporosis. Discuss your test results, treatment options, and if necessary, the need for more tests with your health care provider. Vaccines  Your health care provider may recommend  certain vaccines, such as: Influenza vaccine. This is recommended every year. Tetanus, diphtheria, and acellular pertussis (Tdap, Td) vaccine. You may need a Td booster every 10 years. Zoster vaccine. You may need this after age 27. Pneumococcal 13-valent conjugate (PCV13) vaccine. You may need this if you have certain conditions and were not previously vaccinated. Pneumococcal polysaccharide (PPSV23) vaccine. You may need one or two doses if you smoke cigarettes or if you have certain conditions. Talk to your health care provider about which screenings and vaccines you need and how often you need them. This information is not intended to replace advice given to you by your health care provider. Make sure you discuss any questions you have with your health care provider. Document Released: 10/13/2015 Document Revised: 06/05/2016 Document Reviewed: 07/18/2015 Elsevier Interactive Patient Education  2017 [employer].    Fall Prevention in the Home Falls can cause injuries. They can happen to people of all ages. There are many things you can do to make your home safe and to help prevent falls. What can I do on the outside of my home? Regularly fix the edges of walkways and driveways and fix any cracks. Remove anything that might make you trip as you walk through a door, such as a raised step or threshold. Trim any bushes or trees on the path to your home. Use bright outdoor lighting. Clear any walking paths of anything that might make someone trip, such as rocks or tools. Regularly check to see if handrails are loose or broken. Make sure that both sides of any steps have handrails. Any raised decks and porches should have guardrails on the edges. Have any leaves, snow, or ice cleared regularly. Use sand or salt on walking paths during winter. Clean up any spills in your garage right away. This includes oil or grease spills. What can I do in the bathroom? Use night lights. Install grab bars  by the toilet and in the tub and shower. Do not use towel bars as grab bars. Use non-skid mats or decals in the tub or shower. If you need to sit down in the shower, use a plastic, non-slip stool. Keep the floor dry. Clean up any water that spills on the floor as soon as it happens. Remove soap buildup in the tub or shower regularly. Attach bath mats securely with double-sided non-slip rug tape. Do not have throw rugs and other things on the floor that can make you trip. What can I do in the bedroom? Use night lights. Make sure that you have a light by your bed that is easy to reach. Do not use any sheets or blankets that are too big for your bed. They should not hang down onto the floor. Have a firm chair that has side arms. You can use this for support while you get dressed. Do not have throw rugs and other things on the floor that can make you trip. What can I do in the kitchen? Clean up any spills right away. Avoid walking on wet floors. Keep items that you use a lot in easy-to-reach places. If  you need to reach something above you, use a strong step stool that has a grab bar. Keep electrical cords out of the way. Do not use floor polish or wax that makes floors slippery. If you must use wax, use non-skid floor wax. Do not have throw rugs and other things on the floor that can make you trip. What can I do with my stairs? Do not leave any items on the stairs. Make sure that there are handrails on both sides of the stairs and use them. Fix handrails that are broken or loose. Make sure that handrails are as long as the stairways. Check any carpeting to make sure that it is firmly attached to the stairs. Fix any carpet that is loose or worn. Avoid having throw rugs at the top or bottom of the stairs. If you do have throw rugs, attach them to the floor with carpet tape. Make sure that you have a light switch at the top of the stairs and the bottom of the stairs. If you do not have them, ask  someone to add them for you. What else can I do to help prevent falls? Wear shoes that: Do not have high heels. Have rubber bottoms. Are comfortable and fit you well. Are closed at the toe. Do not wear sandals. If you use a stepladder: Make sure that it is fully opened. Do not climb a closed stepladder. Make sure that both sides of the stepladder are locked into place. Ask someone to hold it for you, if possible. Clearly mark and make sure that you can see: Any grab bars or handrails. First and last steps. Where the edge of each step is. Use tools that help you move around (mobility aids) if they are needed. These include: Canes. Walkers. Scooters. Crutches. Turn on the lights when you go into a dark area. Replace any light bulbs as soon as they burn out. Set up your furniture so you have a clear path. Avoid moving your furniture around. If any of your floors are uneven, fix them. If there are any pets around you, be aware of where they are. Review your medicines with your doctor. Some medicines can make you feel dizzy. This can increase your chance of falling. Ask your doctor what other things that you can do to help prevent falls. This information is not intended to replace advice given to you by your health care provider. Make sure you discuss any questions you have with your health care provider. Document Released: 07/13/2009 Document Revised: 02/22/2016 Document Reviewed: 10/21/2014 Elsevier Interactive Patient Education  2017 [employer].

## 2023-06-25 DIAGNOSIS — M25569 Pain in unspecified knee: Secondary | ICD-10-CM | POA: Diagnosis not present

## 2023-07-07 DIAGNOSIS — M25569 Pain in unspecified knee: Secondary | ICD-10-CM | POA: Diagnosis not present

## 2023-07-11 DIAGNOSIS — M25569 Pain in unspecified knee: Secondary | ICD-10-CM | POA: Diagnosis not present

## 2023-10-06 ENCOUNTER — Encounter: Payer: Medicare Other | Admitting: Family Medicine

## 2023-10-09 ENCOUNTER — Other Ambulatory Visit: Payer: Self-pay | Admitting: Cardiology

## 2023-10-14 ENCOUNTER — Encounter: Payer: Self-pay | Admitting: Family Medicine

## 2023-10-14 ENCOUNTER — Ambulatory Visit: Payer: Medicare Other | Admitting: Family Medicine

## 2023-10-14 VITALS — BP 130/70 | HR 67 | Temp 97.8°F | Resp 18 | Ht 62.0 in | Wt 177.0 lb

## 2023-10-14 DIAGNOSIS — E785 Hyperlipidemia, unspecified: Secondary | ICD-10-CM | POA: Diagnosis not present

## 2023-10-14 DIAGNOSIS — Z0000 Encounter for general adult medical examination without abnormal findings: Secondary | ICD-10-CM

## 2023-10-14 DIAGNOSIS — I10 Essential (primary) hypertension: Secondary | ICD-10-CM | POA: Diagnosis not present

## 2023-10-14 DIAGNOSIS — Z23 Encounter for immunization: Secondary | ICD-10-CM

## 2023-10-14 DIAGNOSIS — K219 Gastro-esophageal reflux disease without esophagitis: Secondary | ICD-10-CM

## 2023-10-14 DIAGNOSIS — M450 Ankylosing spondylitis of multiple sites in spine: Secondary | ICD-10-CM | POA: Diagnosis not present

## 2023-10-14 DIAGNOSIS — E66812 Obesity, class 2: Secondary | ICD-10-CM | POA: Diagnosis not present

## 2023-10-14 DIAGNOSIS — Z6836 Body mass index (BMI) 36.0-36.9, adult: Secondary | ICD-10-CM

## 2023-10-14 LAB — LIPID PANEL
Cholesterol: 171 mg/dL (ref 0–200)
HDL: 89 mg/dL (ref >39.00)
LDL Cholesterol: 73 mg/dL (ref 0–99)
NonHDL: 82.04
Total CHOL/HDL Ratio: 2
Triglycerides: 47 mg/dL (ref 0.0–149.0)
VLDL: 9.4 mg/dL (ref 0.0–40.0)

## 2023-10-14 LAB — COMPREHENSIVE METABOLIC PANEL
ALT: 17 U/L (ref 0–35)
AST: 20 U/L (ref 0–37)
Albumin: 4.1 g/dL (ref 3.5–5.2)
Alkaline Phosphatase: 79 U/L (ref 39–117)
BUN: 11 mg/dL (ref 6–23)
CO2: 31 meq/L (ref 19–32)
Calcium: 9.8 mg/dL (ref 8.4–10.5)
Chloride: 100 meq/L (ref 96–112)
Creatinine, Ser: 0.65 mg/dL (ref 0.40–1.20)
GFR: 92.62 mL/min (ref >60.00)
Glucose, Bld: 94 mg/dL (ref 70–99)
Potassium: 4 meq/L (ref 3.5–5.1)
Sodium: 141 meq/L (ref 135–145)
Total Bilirubin: 0.7 mg/dL (ref 0.2–1.2)
Total Protein: 6.8 g/dL (ref 6.0–8.3)

## 2023-10-14 LAB — CBC WITH DIFFERENTIAL/PLATELET
Basophils Absolute: 0 10*3/uL (ref 0.0–0.1)
Basophils Relative: 0.8 % (ref 0.0–3.0)
Eosinophils Absolute: 0.1 10*3/uL (ref 0.0–0.7)
Eosinophils Relative: 1.6 % (ref 0.0–5.0)
HCT: 41.2 % (ref 36.0–46.0)
Hemoglobin: 13.5 g/dL (ref 12.0–15.0)
Lymphocytes Relative: 56.7 % — ABNORMAL HIGH (ref 12.0–46.0)
Lymphs Abs: 2.5 10*3/uL (ref 0.7–4.0)
MCHC: 32.8 g/dL (ref 30.0–36.0)
MCV: 98 fL (ref 78.0–100.0)
Monocytes Absolute: 0.4 10*3/uL (ref 0.1–1.0)
Monocytes Relative: 9.3 % (ref 3.0–12.0)
Neutro Abs: 1.4 10*3/uL (ref 1.4–7.7)
Neutrophils Relative %: 31.6 % — ABNORMAL LOW (ref 43.0–77.0)
Platelets: 328 10*3/uL (ref 150.0–400.0)
RBC: 4.21 Mil/uL (ref 3.87–5.11)
RDW: 13.2 % (ref 11.5–15.5)
WBC: 4.4 10*3/uL (ref 4.0–10.5)

## 2023-10-14 LAB — TSH: TSH: 2.32 u[IU]/mL (ref 0.35–5.50)

## 2023-10-14 MED ORDER — ESOMEPRAZOLE MAGNESIUM 40 MG PO CPDR
40.0000 mg | DELAYED_RELEASE_CAPSULE | Freq: Every day | ORAL | 1 refills | Status: AC | PRN
Start: 2023-10-14 — End: ?

## 2023-10-14 NOTE — Assessment & Plan Note (Signed)
D/w pt diet and exercise  

## 2023-10-14 NOTE — Assessment & Plan Note (Signed)
 Well controlled, no changes to meds. Encouraged heart healthy diet such as the DASH diet and exercise as tolerated.

## 2023-10-14 NOTE — Progress Notes (Signed)
 Established Patient Office Visit  Subjective   Patient ID: [name], female    DOB: 1955-10-14  Age: 68 y.o. MRN: [id number]  Chief Complaint  Patient presents with   Annual Exam    Pt states fasting.     HPI Discussed the use of AI scribe software for clinical note transcription with the patient, who gave verbal consent to proceed.  History of Present Illness   The patient presents with a primary concern of gastroesophageal reflux disease (GERD), managed intermittently with Nexium  (generic). She reports occasional dietary indiscretions that exacerbate her symptoms. She also expresses concerns about her blood pressure, which has been improving with adherence to prescribed medications and lifestyle modifications. However, she expresses a desire to discontinue the medication in the future.  The patient also reports a weight gain of six pounds since the last visit and acknowledges the need to resume regular exercise. She has been taking diuretics intermittently and requests a kidney function check. She also requests a cholesterol check but admits non-adherence to prescribed cholesterol medication due to fear of side effects.  She has been experiencing nocturia, which she attributes to diuretic use. She also reports ongoing issues with her knee, which lacks complete range of motion following an unspecified procedure. She expresses a desire to return to physical therapy but struggles to find the time due to caregiving responsibilities for her father.  The patient also reports discomfort in the area affected by her ankylosing spondylitis, which she has been managing with massage. She expresses interest in increasing the frequency of these massages. She also reports osteopenia, managed with increased Vitamin D  intake. She is up-to-date with her Pap smear and mammogram, performed by her GYN, and is due for a dental appointment. She has not yet received a pneumonia shot or shingles vaccine.       Patient Active Problem List   Diagnosis Date Noted   Hyperlipidemia 02/20/2023   Edema of right lower extremity 02/20/2023   Leukocytes in urine 02/20/2023   S/P total knee arthroplasty, right 01/28/2023   ASCVD (arteriosclerotic cardiovascular disease) 12/23/2022   Preventative health care 10/01/2022   Elevated serum creatinine 06/26/2021   COVID-19 04/26/2021   Diverticulitis 02/20/2021   Depression 07/10/2020   History of gastric bypass 07/10/2020   Has daytime drowsiness 05/09/2020   Other fatigue 02/29/2020   Short of breath on exertion 02/29/2020   Vitamin D  deficiency 08/11/2018   Gastric bypass status for obesity 07/30/2018   Hives 06/07/2018   Primary osteoarthritis of right knee 01/23/2018   Primary osteoarthritis of both hands 01/23/2018   History of iritis 11/28/2017   History of total hip replacement, left 11/28/2017   Primary osteoarthritis of both knees 11/28/2017   Obstructive sleep apnea 03/08/2016   Seasonal allergies 05/14/2015   Obesity (BMI 30-39.9) 03/16/2014   Long-term current use of steroids 04/20/2013   Noninfectious gastroenteritis and colitis 03/17/2013   Calf pain 10/13/2012   Acute sprain or strain of cervical region 04/10/2012   LEG CRAMPS, NOCTURNAL 11/14/2010   OTHER SPECIFIED DISEASE OF WHITE BLOOD CELLS 07/23/2010   CONSTIPATION 04/06/2010   CORNEAL ABRASION, LEFT 01/22/2010   CHEST PAIN UNSPECIFIED 10/11/2009   BLOOD IN STOOL 12/02/2008   Ankylosing spondylitis (HCC) 03/31/2008   MUSCLE PAIN 03/31/2008   BARIATRIC SURGERY STATUS 01/14/2008   THYROID  CYST 06/26/2007   Class 2 severe obesity with serious comorbidity and body mass index (BMI) of 36.0 to 36.9 in adult [facility]) 06/26/2007   Essential hypertension 06/26/2007  GERD 06/26/2007   IBS 06/26/2007   LYMPHADENOPATHY 06/10/2007   History of thyroid  nodule 06/10/2007   Past Medical History:  Diagnosis Date   Abdominal pain, other specified site 03/31/2008   Centricity  Description: FLANK PAIN, RIGHT Qualifier: Diagnosis of  By: [name] MD, William   Centricity Description: ABDOMINAL PAIN OTHER SPECIFIED SITE Qualifier: Diagnosis of  By: Antonio ROSALEA Rockers     Acute sprain or strain of cervical region 04/10/2012   ALLERGIC RHINITIS 06/26/2007   Qualifier: Diagnosis of  By: Wilhemina RMA, Lucy     ANKYLOSING SPONDYLITIS 03/31/2008   Qualifier: Diagnosis of  By: [name] MD, Elsie     Ankylosing spondylitis (HCC)    neck and spin   Ankylosing spondylitis of multiple sites in spine (HCC)    Arthritis    Back pain    Bariatric surgery status 01/14/2008   Qualifier: Diagnosis of  By: Antonio DO, Rockers     Blood in stool 12/02/2008   Qualifier: Diagnosis of  By: Antonio ROSALEA Rockers     Blood transfusion without reported diagnosis    Calf pain 10/13/2012   CHEST PAIN UNSPECIFIED 10/11/2009   Qualifier: Diagnosis of  By: Debrah MD, Lamar BIRCH    CONSTIPATION 04/06/2010   Qualifier: Diagnosis of  By: Antonio ROSALEA Rockers     CORNEAL ABRASION, LEFT 01/22/2010   Qualifier: Diagnosis of  By: Antonio ROSALEA Rockers     Gastritis    GERD 06/26/2007   Qualifier: Diagnosis of  By: Wilhemina RMA, Lucy     GERD (gastroesophageal reflux disease)    Hiatal hernia    Hyperlipidemia    Hypertension    no medications now   IBS 06/26/2007   Qualifier: Diagnosis of  By: Wilhemina RMA, Lucy     Joint pain    Lactose intolerance    LEG CRAMPS, NOCTURNAL 11/14/2010   Qualifier: Diagnosis of  By: Josetta Corrigan     Long-term current use of steroids 04/20/2013   LYMPHADENOPATHY 06/10/2007   Qualifier: Diagnosis of  By: Antonio ROSALEA Rockers     MORBID OBESITY 06/26/2007   Qualifier: Diagnosis of  By: Wilhemina SHARALYN Aspen     MUSCLE PAIN 03/31/2008   Qualifier: Diagnosis of  By: [name] MD, William     Noninfectious gastroenteritis and colitis 03/17/2013   Obesity (BMI 30-39.9) 03/16/2014   OSA (obstructive sleep apnea) 03/08/2016   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/23/2010   Qualifier:  Diagnosis of  By: Antonio ROSALEA Rockers     Rheumatoid arthritis (HCC)    Seasonal allergies 05/14/2015   THYROID  CYST 06/26/2007   Qualifier: Diagnosis of  By: Wilhemina RMA, Lucy     THYROID  NODULE, HX OF 06/10/2007   Qualifier: Diagnosis of  By: Antonio ROSALEA Rockers     Vitamin D  deficiency    Past Surgical History:  Procedure Laterality Date   BARIATRIC SURGERY     BREAST REDUCTION SURGERY  1996   COLONOSCOPY  10/24/2009   kaplan-normal exam   ESOPHAGOGASTRODUODENOSCOPY     pelvis replacement  2010   TOTAL HIP ARTHROPLASTY     x 2 left, one revision   TOTAL KNEE ARTHROPLASTY Right 01/28/2023   Procedure: TOTAL KNEE ARTHROPLASTY;  Surgeon: Ernie Cough, MD;  Location: [facility];  Service: Orthopedics;  Laterality: Right;   UPPER GASTROINTESTINAL ENDOSCOPY     Social History   Tobacco Use   Smoking status: Never   Smokeless tobacco: Never  Vaping Use  Vaping status: Never Used  Substance Use Topics   Alcohol use: No    Alcohol/week: 0.0 standard drinks of alcohol   Drug use: No   Social History   Socioeconomic History   Marital status: Married    Spouse name: Beatric Fulop   Number of children: Not on file   Years of education: Not on file   Highest education level: Master's degree (e.g., MA, MS, MEng, MEd, MSW, MBA)  Occupational History   Occupation: retired  Tobacco Use   Smoking status: Never   Smokeless tobacco: Never  Vaping Use   Vaping status: Never Used  Substance and Sexual Activity   Alcohol use: No    Alcohol/week: 0.0 standard drinks of alcohol   Drug use: No   Sexual activity: Yes  Other Topics Concern   Not on file  Social History Narrative   Occupation: Sports Coach   Daily Caffeine use- 1 cup daily      Social Drivers of Corporate Investment Banker Strain: Low Risk  (10/11/2023)   Overall Financial Resource Strain (CARDIA)    Difficulty of Paying Living Expenses: Not hard at all  Food Insecurity: No Food Insecurity (10/11/2023)   Hunger Vital  Sign    Worried About Running Out of Food in the Last Year: Never true    Ran Out of Food in the Last Year: Never true  Transportation Needs: No Transportation Needs (10/11/2023)   PRAPARE - Administrator, Civil Service (Medical): No    Lack of Transportation (Non-Medical): No  Physical Activity: Unknown (10/11/2023)   Exercise Vital Sign    Days of Exercise per Week: 0 days    Minutes of Exercise per Session: Not on file  Stress: No Stress Concern Present (10/11/2023)   [employer] of Occupational Health - Occupational Stress Questionnaire    Feeling of Stress : Not at all  Social Connections: Socially Integrated (10/11/2023)   Social Connection and Isolation Panel [NHANES]    Frequency of Communication with Friends and Family: More than three times a week    Frequency of Social Gatherings with Friends and Family: More than three times a week    Attends Religious Services: More than 4 times per year    Active Member of [employer] or Organizations: Yes    Attends Banker Meetings: More than 4 times per year    Marital Status: Married  Catering Manager Violence: Unknown (06/06/2023)   Received from [facility]   HITS    Physically Hurt: Not on file    Insult or Talk Down To: Not on file    Threaten Physical Harm: Not on file    Scream or Curse: Not on file   Family Status  Relation Name Status   Mother  Alive   Father  Alive   Sister  Alive   MGM  (Not Specified)   Neg Hx  (Not Specified)  No partnership data on file   Family History  Problem Relation Age of Onset   Hypertension Mother    High Cholesterol Mother    Diabetes Mother    Bladder Cancer Mother    Arthritis Father    Hypertension Father    Diabetes Father    High Cholesterol Father    Hypertension Sister    Diabetes Sister    Melanoma Maternal Grandmother    Breast cancer Maternal Grandmother        and Aunt   Colon cancer Neg Hx  Esophageal cancer Neg Hx    Rectal cancer Neg  Hx    Stomach cancer Neg Hx    Colon polyps Neg Hx    Allergies  Allergen Reactions   Aspirin     REACTION: intolerance-gi upset   Codeine Itching   Morphine Itching   Norvasc  [Amlodipine ]    Sulfa Antibiotics Other (See Comments)    Unsure of reaction   Sulfonamide Derivatives     Unsure of reaction      Review of Systems  Constitutional:  Negative for fever and malaise/fatigue.  HENT:  Negative for congestion.   Eyes:  Negative for blurred vision.  Respiratory:  Negative for shortness of breath.   Cardiovascular:  Negative for chest pain, palpitations and leg swelling.  Gastrointestinal:  Negative for abdominal pain, blood in stool and nausea.  Genitourinary:  Negative for dysuria and frequency.  Musculoskeletal:  Negative for falls.  Skin:  Negative for rash.  Neurological:  Negative for dizziness, loss of consciousness and headaches.  Endo/Heme/Allergies:  Negative for environmental allergies.  Psychiatric/Behavioral:  Negative for depression. The patient is not nervous/anxious.       Objective:     BP 130/70 (BP Location: Right Arm, Patient Position: Sitting, Cuff Size: Normal)   Pulse 67   Temp 97.8 F (36.6 C) (Oral)   Resp 18   Ht 5' 2 (1.575 m)   Wt 177 lb (80.3 kg)   SpO2 100%   BMI 32.37 kg/m  BP Readings from Last 3 Encounters:  10/14/23 130/70  06/24/23 118/84  06/03/23 130/80   Wt Readings from Last 3 Encounters:  10/14/23 177 lb (80.3 kg)  06/24/23 171 lb (77.6 kg)  06/03/23 171 lb 9.6 oz (77.8 kg)   SpO2 Readings from Last 3 Encounters:  10/14/23 100%  06/03/23 99%  02/20/23 98%      Physical Exam Vitals and nursing note reviewed.  Constitutional:      General: She is not in acute distress.    Appearance: Normal appearance. She is well-developed.  HENT:     Head: Normocephalic and atraumatic.     Right Ear: Tympanic membrane, ear canal and external ear normal. There is no impacted cerumen.     Left Ear: Tympanic membrane, ear  canal and external ear normal. There is no impacted cerumen.     Nose: Nose normal.     Mouth/Throat:     Mouth: Mucous membranes are moist.     Pharynx: Oropharynx is clear. No oropharyngeal exudate or posterior oropharyngeal erythema.  Eyes:     General: No scleral icterus.       Right eye: No discharge.        Left eye: No discharge.     Conjunctiva/sclera: Conjunctivae normal.     Pupils: Pupils are equal, round, and reactive to light.  Neck:     Thyroid : No thyromegaly or thyroid  tenderness.     Vascular: No JVD.  Cardiovascular:     Rate and Rhythm: Normal rate and regular rhythm.     Heart sounds: Normal heart sounds. No murmur heard. Pulmonary:     Effort: Pulmonary effort is normal. No respiratory distress.     Breath sounds: Normal breath sounds.  Abdominal:     General: Bowel sounds are normal. There is no distension.     Palpations: Abdomen is soft. There is no mass.     Tenderness: There is no abdominal tenderness. There is no guarding or rebound.  Musculoskeletal:  General: Normal range of motion.     Cervical back: Normal range of motion and neck supple.     Right lower leg: No edema.     Left lower leg: No edema.  Lymphadenopathy:     Cervical: No cervical adenopathy.  Skin:    General: Skin is warm and dry.     Findings: No erythema or rash.  Neurological:     Mental Status: She is alert and oriented to person, place, and time.     Cranial Nerves: No cranial nerve deficit.     Deep Tendon Reflexes: Reflexes are normal and symmetric.  Psychiatric:        Mood and Affect: Mood normal.        Behavior: Behavior normal.        Thought Content: Thought content normal.        Judgment: Judgment normal.      No results found for any visits on 10/14/23.  Last CBC Lab Results  Component Value Date   WBC 5.3 06/03/2023   HGB 13.8 06/03/2023   HCT 43.5 06/03/2023   MCV 95.0 06/03/2023   MCH 31.2 01/30/2023   RDW 14.4 06/03/2023   PLT 362.0  06/03/2023   Last metabolic panel Lab Results  Component Value Date   GLUCOSE 67 (L) 06/03/2023   NA 140 06/03/2023   K 4.3 06/03/2023   CL 102 06/03/2023   CO2 31 06/03/2023   BUN 14 06/03/2023   CREATININE 0.62 06/03/2023   GFR 93.91 06/03/2023   CALCIUM  10.2 06/03/2023   PHOS 4.2 02/04/2007   PROT 7.3 06/03/2023   ALBUMIN 4.2 06/03/2023   LABGLOB 2.9 04/01/2022   AGRATIO 1.4 04/01/2022   BILITOT 0.5 06/03/2023   ALKPHOS 91 06/03/2023   AST 19 06/03/2023   ALT 15 06/03/2023   ANIONGAP 8 01/29/2023   Last lipids Lab Results  Component Value Date   CHOL 158 06/03/2023   HDL 82.00 06/03/2023   LDLCALC 64 06/03/2023   TRIG 58.0 06/03/2023   CHOLHDL 2 06/03/2023   Last hemoglobin A1c Lab Results  Component Value Date   HGBA1C 5.5 04/01/2022   Last thyroid  functions Lab Results  Component Value Date   TSH 2.04 06/03/2023   T3TOTAL 112 02/20/2021   Last vitamin D  Lab Results  Component Value Date   VD25OH 39.25 10/01/2022   Last vitamin B12 and Folate Lab Results  Component Value Date   VITAMINB12 710 10/01/2022   FOLATE >20.0 04/22/2022      The 10-year ASCVD risk score (Arnett DK, et al., 2019) is: 7%    Assessment & Plan:   Problem List Items Addressed This Visit       Unprioritized   GERD   Relevant Medications   esomeprazole  (NEXIUM ) 40 MG capsule   Other Relevant Orders   CBC with Differential/Platelet   Comprehensive metabolic panel   Lipid panel   TSH   Ankylosing spondylitis (HCC)   Preventative health care - Primary   Ghm utd Check labs See AVS Health Maintenance  Topic Date Due   Pneumonia Vaccine 45+ Years old (1 of 2 - PCV) Never done   Zoster Vaccines- Shingrix (1 of 2) Never done   Cervical Cancer Screening (HPV/Pap Cotest)  08/02/2017   COVID-19 Vaccine (6 - 2024-25 season) 06/01/2023   DEXA SCAN  Never done   INFLUENZA VACCINE  12/29/2023 (Originally 05/01/2023)   MAMMOGRAM  10/31/2023   DTaP/Tdap/Td (2 - Td or Tdap)  03/16/2024  Medicare Annual Wellness (AWV)  06/23/2024   Colonoscopy  04/13/2030   Hepatitis C Screening  Completed   HIV Screening  Completed   HPV VACCINES  Aged Out         Hyperlipidemia   Encourage heart healthy diet such as MIND or DASH diet, increase exercise, avoid trans fats, simple carbohydrates and processed foods, consider a krill or fish or flaxseed oil cap daily.        Relevant Orders   CBC with Differential/Platelet   Comprehensive metabolic panel   Lipid panel   TSH   Essential hypertension   Well controlled, no changes to meds. Encouraged heart healthy diet such as the DASH diet and exercise as tolerated.        Relevant Orders   CBC with Differential/Platelet   Comprehensive metabolic panel   Lipid panel   TSH   Class 2 severe obesity with serious comorbidity and body mass index (BMI) of 36.0 to 36.9 in adult [facility])   D/w pt diet and exercise        Other Visit Diagnoses       Need for pneumococcal 20-valent conjugate vaccination       Relevant Orders   Pneumococcal conjugate vaccine 20-valent (Prevnar 20)     Assessment and Plan    Gastroesophageal Reflux Disease (GERD)   Intermittent symptoms are triggered by certain foods, and medication is used as needed. Insurance coverage for a 90-day supply was discussed. Refill esomeprazole  (Nexium ) for a 90-day supply at [facility].  Hypertension   Blood pressure is improving with current management. There is motivation to lose weight and reduce medication, although there has been a 6-pound weight gain since the last visit. Diuretics are used intermittently, and a kidney function check is needed. Continue current antihypertensive medication, monitor blood pressure regularly, and order kidney function tests.  Hyperlipidemia   There is non-compliance with medication due to fear of side effects. The risks of untreated hyperlipidemia, including heart attack and stroke, were discussed. Preference for natural  alternatives was noted, but she was informed of her ineffectiveness. Order a lipid panel and encourage taking prescribed statin medication.  Osteopenia   Vitamin D  supplements are being taken, but there is a need to increase physical activity. Exercise was stopped due to caregiving responsibilities. Encourage weight-bearing exercises and continue vitamin D  supplementation.  Knee Stiffness   There is limited range of motion and stiffness, and physical therapy has not been completed. Resuming physical therapy is necessary. Refer to physical therapy and encourage regular exercise.  Ankylosing Spondylitis   Muscle tightness and discomfort are present, with benefits from massage therapy. Regular massages are recommended, at least once a month.  General Health Maintenance   Due for a pneumonia vaccination and has not received the shingles vaccine. Regular eye and dental check-ups are needed. The last Pap smear and mammogram were in June last year. Administer the pneumonia vaccine, recommend the shingles vaccine at the pharmacy, and encourage regular eye and dental check-ups.  Follow-up   Order blood work, including kidney function tests, and schedule a follow-up appointment.        Return in about 6 months (around 04/12/2024), or if symptoms worsen or fail to improve.    Saurav Crumble R Lowne Chase, DO

## 2023-10-14 NOTE — Patient Instructions (Signed)
 Preventive Care 83 Years and Older, Female Preventive care refers to lifestyle choices and visits with your health care provider that can promote health and wellness. Preventive care visits are also called wellness exams. What can I expect for my preventive care visit? Counseling Your health care provider may ask you questions about your: Medical history, including: Past medical problems. Family medical history. Pregnancy and menstrual history. History of falls. Current health, including: Memory and ability to understand (cognition). Emotional well-being. Home life and relationship well-being. Sexual activity and sexual health. Lifestyle, including: Alcohol, nicotine or tobacco, and drug use. Access to firearms. Diet, exercise, and sleep habits. Work and work Astronomer. Sunscreen use. Safety issues such as seatbelt and bike helmet use. Physical exam Your health care provider will check your: Height and weight. These may be used to calculate your BMI (body mass index). BMI is a measurement that tells if you are at a healthy weight. Waist circumference. This measures the distance around your waistline. This measurement also tells if you are at a healthy weight and may help predict your risk of certain diseases, such as type 2 diabetes and high blood pressure. Heart rate and blood pressure. Body temperature. Skin for abnormal spots. What immunizations do I need?  Vaccines are usually given at various ages, according to a schedule. Your health care provider will recommend vaccines for you based on your age, medical history, and lifestyle or other factors, such as travel or where you work. What tests do I need? Screening Your health care provider may recommend screening tests for certain conditions. This may include: Lipid and cholesterol levels. Hepatitis C test. Hepatitis B test. HIV (human immunodeficiency virus) test. STI (sexually transmitted infection) testing, if you are at  risk. Lung cancer screening. Colorectal cancer screening. Diabetes screening. This is done by checking your blood sugar (glucose) after you have not eaten for a while (fasting). Mammogram. Talk with your health care provider about how often you should have regular mammograms. BRCA-related cancer screening. This may be done if you have a family history of breast, ovarian, tubal, or peritoneal cancers. Bone density scan. This is done to screen for osteoporosis. Talk with your health care provider about your test results, treatment options, and if necessary, the need for more tests. Follow these instructions at home: Eating and drinking  Eat a diet that includes fresh fruits and vegetables, whole grains, lean protein, and low-fat dairy products. Limit your intake of foods with high amounts of sugar, saturated fats, and salt. Take vitamin and mineral supplements as recommended by your health care provider. Do not drink alcohol if your health care provider tells you not to drink. If you drink alcohol: Limit how much you have to 0-1 drink a day. Know how much alcohol is in your drink. In the [place] one drink equals one 12 oz bottle of beer (355 mL), one 5 oz glass of wine (148 mL), or one 1 oz glass of hard liquor (44 mL). Lifestyle Brush your teeth every morning and night with fluoride toothpaste. Floss one time each day. Exercise for at least 30 minutes 5 or more days each week. Do not use any products that contain nicotine or tobacco. These products include cigarettes, chewing tobacco, and vaping devices, such as e-cigarettes. If you need help quitting, ask your health care provider. Do not use drugs. If you are sexually active, practice safe sex. Use a condom or other form of protection in order to prevent STIs. Take aspirin only as told by  your health care provider. Make sure that you understand how much to take and what form to take. Work with your health care provider to find out whether it  is safe and beneficial for you to take aspirin daily. Ask your health care provider if you need to take a cholesterol-lowering medicine (statin). Find healthy ways to manage stress, such as: Meditation, yoga, or listening to music. Journaling. Talking to a trusted person. Spending time with friends and family. Minimize exposure to UV radiation to reduce your risk of skin cancer. Safety Always wear your seat belt while driving or riding in a vehicle. Do not drive: If you have been drinking alcohol. Do not ride with someone who has been drinking. When you are tired or distracted. While texting. If you have been using any mind-altering substances or drugs. Wear a helmet and other protective equipment during sports activities. If you have firearms in your house, make sure you follow all gun safety procedures. What's next? Visit your health care provider once a year for an annual wellness visit. Ask your health care provider how often you should have your eyes and teeth checked. Stay up to date on all vaccines. This information is not intended to replace advice given to you by your health care provider. Make sure you discuss any questions you have with your health care provider. Document Revised: 03/14/2021 Document Reviewed: 03/14/2021 Elsevier Patient Education  2024 [employer].

## 2023-10-14 NOTE — Assessment & Plan Note (Signed)
 Ghm utd Check labs See [facility] Health Maintenance  Topic Date Due   Pneumonia Vaccine 82+ Years old (1 of 2 - PCV) Never done   Zoster Vaccines- Shingrix (1 of 2) Never done   Cervical Cancer Screening (HPV/Pap Cotest)  08/02/2017   COVID-19 Vaccine (6 - 2024-25 season) 06/01/2023   DEXA SCAN  Never done   INFLUENZA VACCINE  12/29/2023 (Originally 05/01/2023)   MAMMOGRAM  10/31/2023   DTaP/Tdap/Td (2 - Td or Tdap) 03/16/2024   Medicare Annual Wellness (AWV)  06/23/2024   Colonoscopy  04/13/2030   Hepatitis C Screening  Completed   HIV Screening  Completed   HPV VACCINES  Aged Out

## 2023-10-14 NOTE — Assessment & Plan Note (Signed)
 Encourage heart healthy diet such as MIND or DASH diet, increase exercise, avoid trans fats, simple carbohydrates and processed foods, consider a krill or fish or flaxseed oil cap daily.

## 2023-10-19 ENCOUNTER — Encounter: Payer: Self-pay | Admitting: Family Medicine

## 2023-11-19 DIAGNOSIS — M7542 Impingement syndrome of left shoulder: Secondary | ICD-10-CM | POA: Diagnosis not present

## 2023-11-19 DIAGNOSIS — M25561 Pain in right knee: Secondary | ICD-10-CM | POA: Diagnosis not present

## 2023-11-19 DIAGNOSIS — Z96651 Presence of right artificial knee joint: Secondary | ICD-10-CM | POA: Diagnosis not present

## 2023-11-19 DIAGNOSIS — M25512 Pain in left shoulder: Secondary | ICD-10-CM | POA: Diagnosis not present

## 2023-12-03 ENCOUNTER — Other Ambulatory Visit: Payer: Self-pay | Admitting: Cardiology

## 2024-01-14 DIAGNOSIS — Z96651 Presence of right artificial knee joint: Secondary | ICD-10-CM | POA: Diagnosis not present

## 2024-01-23 ENCOUNTER — Telehealth: Payer: Self-pay

## 2024-01-23 MED ORDER — CHLORTHALIDONE 25 MG PO TABS: 12.5000 mg | ORAL_TABLET | Freq: Every day | ORAL | 2 refills | Status: DC

## 2024-01-23 NOTE — Telephone Encounter (Signed)
 Copied from CRM [id number]. Topic: Clinical - Medication Question >> Jan 23, 2024 10:26 AM Elita Guitar wrote: Reason for CRM: patient called and explained that she was receiving chlorthalidone  from her cardiologist, Dr. Renna Cary, for her blood pressure. She is asking if Dr. Jalene Mayor could take over and continue refills for it. Please advise.

## 2024-01-23 NOTE — Telephone Encounter (Signed)
 Refill sent.

## 2024-01-23 NOTE — Addendum Note (Signed)
 Addended by: Ysidro Her on: 01/23/2024 01:29 PM   Modules accepted: Orders

## 2024-01-30 DIAGNOSIS — H5213 Myopia, bilateral: Secondary | ICD-10-CM | POA: Diagnosis not present

## 2024-01-30 DIAGNOSIS — H25013 Cortical age-related cataract, bilateral: Secondary | ICD-10-CM | POA: Diagnosis not present

## 2024-01-30 DIAGNOSIS — H35371 Puckering of macula, right eye: Secondary | ICD-10-CM | POA: Diagnosis not present

## 2024-01-30 DIAGNOSIS — H2513 Age-related nuclear cataract, bilateral: Secondary | ICD-10-CM | POA: Diagnosis not present

## 2024-03-16 ENCOUNTER — Telehealth: Payer: Self-pay

## 2024-03-16 NOTE — Progress Notes (Signed)
 [facility] Evaluation  Patient Details  Name: [name] MRN: [id number] Date of Birth: 1956/02/26 Age: 68 y.o. PCP: Crecencio Dodge, Candida Chalk, DO  Vitals:   03/16/24 1603  BP: (!) 140/76  Pulse: 71  SpO2: 99%  Weight: 187 lb 12.8 oz (85.2 kg)     [facility] Eval - 03/16/24 1600       [facility] PREP Location   [facility] PREP Location [facility]      Referral    Referring Provider Pending    Reason for referral Hypertension;Inactivity;Obesitity/Overweight;High Cholesterol    Program Start Date 03/22/24      Measurement   Waist Circumference 38 inches    Hip Circumference 47.5 inches    Body fat 46.4 percent      Information for Trainer   Goals --   Lose 10-15 pounds by end of program;   Current Exercise walk/bike    Orthopedic Concerns --   ankylosing spondylitis; R TKA '24, L THR '10   Pertinent Medical History --   HTN, OSA, HLD     Timed Up and Go (TUGS)   Timed Up and Go Low risk <9 seconds      Mobility and Daily Activities   I find it easy to walk up or down two or more flights of stairs. 2    I have no trouble taking out the trash. 4    I do housework such as vacuuming and dusting on my own without difficulty. 1    I can easily lift a gallon of milk (8lbs). 4    I can easily walk a mile. 1    I have no trouble reaching into high cupboards or reaching down to pick up something from the floor. 4    I do not have trouble doing out-door work such as Loss adjuster, chartered, raking leaves, or gardening. 4      Mobility and Daily Activities   I feel younger than my age. 4    I feel independent. 4    I feel energetic. 2    I live an active life.  4    I feel strong. 2    I feel healthy. 2    I feel active as other people my age. 1      How fit and strong are you.   Fit and Strong Total Score 39         Past Medical History:  Diagnosis Date   Abdominal pain, other specified site 03/31/2008   Centricity Description: FLANK PAIN, RIGHT Qualifier: Diagnosis of  By:  Donnice Gale MD, William   Centricity Description: ABDOMINAL PAIN OTHER SPECIFIED SITE Qualifier: Diagnosis of  By: Tracy Friedlander     Acute sprain or strain of cervical region 04/10/2012   ALLERGIC RHINITIS 06/26/2007   Qualifier: Diagnosis of  By: Georganne Kind RMA, Lucy     ANKYLOSING SPONDYLITIS 03/31/2008   Qualifier: Diagnosis of  By: Donnice Gale MD, Sammie Crigler     Ankylosing spondylitis (HCC)    neck and spin   Ankylosing spondylitis of multiple sites in spine [facility])    Arthritis    Back pain    Bariatric surgery status 01/14/2008   Qualifier: Diagnosis of  By: Tracy Friedlander     Blood in stool 12/02/2008   Qualifier: Diagnosis of  By: Tracy Friedlander     Blood transfusion without reported diagnosis    Calf pain 10/13/2012   CHEST PAIN UNSPECIFIED 10/11/2009   Qualifier:  Diagnosis of  By: Arvie Latus MD, Moishe Angel    CONSTIPATION 04/06/2010   Qualifier: Diagnosis of  By: Tracy Friedlander     CORNEAL ABRASION, LEFT 01/22/2010   Qualifier: Diagnosis of  By: Tracy Friedlander     Gastritis    GERD 06/26/2007   Qualifier: Diagnosis of  By: Georganne Kind RMA, Lucy     GERD (gastroesophageal reflux disease)    Hiatal hernia    Hyperlipidemia    Hypertension    no medications now   IBS 06/26/2007   Qualifier: Diagnosis of  By: Georganne Kind RMA, Lucy     Joint pain    Lactose intolerance    LEG CRAMPS, NOCTURNAL 11/14/2010   Qualifier: Diagnosis of  By: Maebelle Schmid     Long-term current use of steroids 04/20/2013   LYMPHADENOPATHY 06/10/2007   Qualifier: Diagnosis of  By: Tracy Friedlander     MORBID OBESITY 06/26/2007   Qualifier: Diagnosis of  By: Marrian Siva     MUSCLE PAIN 03/31/2008   Qualifier: Diagnosis of  By: Donnice Gale MD, William     Noninfectious gastroenteritis and colitis 03/17/2013   Obesity (BMI 30-39.9) 03/16/2014   OSA (obstructive sleep apnea) 03/08/2016   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/23/2010   Qualifier: Diagnosis of  By: Tracy Friedlander     Rheumatoid arthritis (HCC)     Seasonal allergies 05/14/2015   THYROID  CYST 06/26/2007   Qualifier: Diagnosis of  By: Georganne Kind RMA, Lucy     THYROID  NODULE, HX OF 06/10/2007   Qualifier: Diagnosis of  By: Tracy Friedlander     Vitamin D  deficiency    Past Surgical History:  Procedure Laterality Date   BARIATRIC SURGERY     BREAST REDUCTION SURGERY  1996   COLONOSCOPY  10/24/2009   kaplan-normal exam   ESOPHAGOGASTRODUODENOSCOPY     pelvis replacement  2010   TOTAL HIP ARTHROPLASTY     x 2 left, one revision   TOTAL KNEE ARTHROPLASTY Right 01/28/2023   Procedure: TOTAL KNEE ARTHROPLASTY;  Surgeon: Claiborne Crew, MD;  Location: [facility];  Service: Orthopedics;  Laterality: Right;   UPPER GASTROINTESTINAL ENDOSCOPY     Social History   Tobacco Use  Smoking Status Never  Smokeless Tobacco Never  To begin PREP class at [facility] on June 23, every M/W 2-3:15  Jim Motts 03/16/2024, 4:09 PM

## 2024-03-16 NOTE — Telephone Encounter (Signed)
 She called me inquiring about PREP program at [facility]; she would like to attend next class on June 23, every M/W at 2; will come in later today for assessment visit; referral requested from Dr. Jalene Mayor.

## 2024-03-22 NOTE — Progress Notes (Signed)
 [facility] Weekly Session  Patient Details  Name: [name] MRN: [id number] Date of Birth: Nov 08, 1955 Age: 68 y.o. PCP: Courtney Cyndee Jamee JONELLE, [name]  There were no vitals filed for this visit.   [facility] Weekly seesion - 03/22/24 1500       [facility] PREP Location   [facility] PREP Location [facility]      Weekly Session   Topic Discussed Goal setting and welcome to the program   Introductions, review of notebook, tour of facility; offered option to work out on cardio machine.   Classes attended to date 1          [name] 03/22/2024, 3:40 PM

## 2024-03-29 NOTE — Progress Notes (Signed)
 [facility] Weekly Session  Patient Details  Name: MAKENZEE CHOUDHRY MRN: [id number] Date of Birth: 03-07-1956 Age: 68 y.o. PCP: Antonio Cyndee Jamee JONELLE, DO  Vitals:   03/29/24 1516  Weight: 186 lb 12.8 oz (84.7 kg)     [facility] Weekly seesion - 03/29/24 1500       [facility] PREP Location   [facility] PREP Location [facility]      Weekly Session   Topic Discussed Importance of resistance training;Other ways to be active   Goal: work up to 150 min/wk of cardio and strength training 2-3 times/wk for 20-40 minutes; reivew of unhealthy/restricitive diet trends to avoid   Minutes exercised this week 40 minutes    Classes attended to date 3          Marco Raper B Neema Fluegge 03/29/2024, 3:17 PM

## 2024-03-30 DIAGNOSIS — H2513 Age-related nuclear cataract, bilateral: Secondary | ICD-10-CM | POA: Diagnosis not present

## 2024-03-30 DIAGNOSIS — H2000 Unspecified acute and subacute iridocyclitis: Secondary | ICD-10-CM | POA: Diagnosis not present

## 2024-04-06 NOTE — Progress Notes (Signed)
 [facility] Weekly Session  Patient Details  Name: [name] MRN: [id number] Date of Birth: 1955/12/07 Age: 68 y.o. PCP: Antonio Cyndee Jamee JONELLE, DO  There were no vitals filed for this visit.   [facility] Weekly seesion - 04/06/24 1300       [facility] PREP Location   [facility] PREP Location [facility]      Weekly Session   Topic Discussed Goal setting and welcome to the program   Introductions, review of notebook; tour of facility; offered option to work out on cardio machine   Classes attended to date 1          [name] [name] 04/06/2024, 1:58 PM

## 2024-04-08 ENCOUNTER — Telehealth: Payer: Self-pay

## 2024-04-08 NOTE — Telephone Encounter (Signed)
 Copied from CRM [id number]. Topic: Clinical - Medical Advice >> Apr 08, 2024  9:10 AM Burnard DEL wrote: Reason for CRM: Patient called in stating would it be okay to stop taking the lisinopril  (ZESTRIL ) 10 MG tablet due to her friend having an very bad adverse reaction,and her blood pressure has been pretty good. She just want to only continue to take the  chlorthalidone  (HYGROTON ) 25 MG tablet. Please advise.

## 2024-04-09 MED ORDER — LOSARTAN POTASSIUM 50 MG PO TABS: 50.0000 mg | ORAL_TABLET | Freq: Every day | ORAL | 2 refills | Status: DC

## 2024-04-09 NOTE — Telephone Encounter (Signed)
 Spoke with patient. Pt verbalized understanding. New rx sent

## 2024-04-09 NOTE — Addendum Note (Signed)
 Addended by: ELOUISE POWELL HERO on: 04/09/2024 11:04 AM   Modules accepted: Orders

## 2024-04-19 ENCOUNTER — Ambulatory Visit: Payer: Medicare Other | Admitting: Family Medicine

## 2024-04-20 NOTE — Progress Notes (Signed)
 [facility] Weekly Session  Patient Details  Name: [name] MRN: [id number] Date of Birth: 11/23/1955 Age: 68 y.o. PCP: Antonio Cyndee Jamee JONELLE, DO  There were no vitals filed for this visit.   [facility] Weekly seesion - 04/20/24 1400       [facility] PREP Location   [facility] PREP Location [facility]      Weekly Session   Topic Discussed Healthy eating tips   foods to reduce, foods to increase; introduced [employer] app   Classes attended to date 2          Dajon Rowe B Maiyah Goyne 04/20/2024, 2:56 PM

## 2024-04-23 ENCOUNTER — Ambulatory Visit: Admitting: Family Medicine

## 2024-04-23 ENCOUNTER — Encounter: Payer: Self-pay | Admitting: Family Medicine

## 2024-04-23 VITALS — BP 118/78 | HR 70 | Temp 97.9°F | Resp 18 | Ht 62.0 in | Wt 186.2 lb

## 2024-04-23 DIAGNOSIS — E785 Hyperlipidemia, unspecified: Secondary | ICD-10-CM | POA: Diagnosis not present

## 2024-04-23 DIAGNOSIS — I2510 Atherosclerotic heart disease of native coronary artery without angina pectoris: Secondary | ICD-10-CM | POA: Diagnosis not present

## 2024-04-23 DIAGNOSIS — I10 Essential (primary) hypertension: Secondary | ICD-10-CM | POA: Diagnosis not present

## 2024-04-23 DIAGNOSIS — E559 Vitamin D deficiency, unspecified: Secondary | ICD-10-CM

## 2024-04-23 DIAGNOSIS — K219 Gastro-esophageal reflux disease without esophagitis: Secondary | ICD-10-CM

## 2024-04-23 DIAGNOSIS — E66812 Obesity, class 2: Secondary | ICD-10-CM | POA: Diagnosis not present

## 2024-04-23 DIAGNOSIS — Z6836 Body mass index (BMI) 36.0-36.9, adult: Secondary | ICD-10-CM

## 2024-04-23 DIAGNOSIS — M450 Ankylosing spondylitis of multiple sites in spine: Secondary | ICD-10-CM | POA: Diagnosis not present

## 2024-04-23 DIAGNOSIS — R739 Hyperglycemia, unspecified: Secondary | ICD-10-CM | POA: Diagnosis not present

## 2024-04-23 LAB — LIPID PANEL
Cholesterol: 169 mg/dL (ref 0–200)
HDL: 89.4 mg/dL (ref >39.00)
LDL Cholesterol: 67 mg/dL (ref 0–99)
NonHDL: 79.26
Total CHOL/HDL Ratio: 2
Triglycerides: 60 mg/dL (ref 0.0–149.0)
VLDL: 12 mg/dL (ref 0.0–40.0)

## 2024-04-23 LAB — COMPREHENSIVE METABOLIC PANEL WITH GFR
ALT: 16 U/L (ref 0–35)
AST: 18 U/L (ref 0–37)
Albumin: 4 g/dL (ref 3.5–5.2)
Alkaline Phosphatase: 75 U/L (ref 39–117)
BUN: 14 mg/dL (ref 6–23)
CO2: 29 meq/L (ref 19–32)
Calcium: 9.6 mg/dL (ref 8.4–10.5)
Chloride: 100 meq/L (ref 96–112)
Creatinine, Ser: 0.76 mg/dL (ref 0.40–1.20)
GFR: 82.12 mL/min (ref >60.00)
Glucose, Bld: 91 mg/dL (ref 70–99)
Potassium: 4 meq/L (ref 3.5–5.1)
Sodium: 137 meq/L (ref 135–145)
Total Bilirubin: 0.7 mg/dL (ref 0.2–1.2)
Total Protein: 6.8 g/dL (ref 6.0–8.3)

## 2024-04-23 LAB — CBC WITH DIFFERENTIAL/PLATELET
Basophils Absolute: 0 K/uL (ref 0.0–0.1)
Basophils Relative: 0.5 % (ref 0.0–3.0)
Eosinophils Absolute: 0.1 K/uL (ref 0.0–0.7)
Eosinophils Relative: 2.1 % (ref 0.0–5.0)
HCT: 41.3 % (ref 36.0–46.0)
Hemoglobin: 13.6 g/dL (ref 12.0–15.0)
Lymphocytes Relative: 47.7 % — ABNORMAL HIGH (ref 12.0–46.0)
Lymphs Abs: 2.5 K/uL (ref 0.7–4.0)
MCHC: 32.9 g/dL (ref 30.0–36.0)
MCV: 95.4 fl (ref 78.0–100.0)
Monocytes Absolute: 0.5 K/uL (ref 0.1–1.0)
Monocytes Relative: 9.1 % (ref 3.0–12.0)
Neutro Abs: 2.1 K/uL (ref 1.4–7.7)
Neutrophils Relative %: 40.6 % — ABNORMAL LOW (ref 43.0–77.0)
Platelets: 337 K/uL (ref 150.0–400.0)
RBC: 4.34 Mil/uL (ref 3.87–5.11)
RDW: 13.5 % (ref 11.5–15.5)
WBC: 5.2 K/uL (ref 4.0–10.5)

## 2024-04-23 LAB — VITAMIN D 25 HYDROXY (VIT D DEFICIENCY, FRACTURES): VITD: 37.23 ng/mL (ref 30.00–100.00)

## 2024-04-23 LAB — HEMOGLOBIN A1C: Hgb A1c MFr Bld: 5.9 % (ref 4.6–6.5)

## 2024-04-23 NOTE — Progress Notes (Signed)
 Established Patient Office Visit  Subjective   Patient ID: [name], female    DOB: [birth year]  Age: 68 y.o. MRN: [id number]  Chief Complaint  Patient presents with   Hypertension   Hyperlipidemia   Follow-up   HPI Discussed the use of AI scribe software for clinical note transcription with the patient, who gave verbal consent to proceed.  History of Present Illness [name] is a 68 year old female who presents for medication management and follow-up.  They recently switched from lisinopril  to losartan  due to chest discomfort experienced with lisinopril . They have been on losartan  for a few days and report that it is 'so far so good'. They are also taking chlorthalidone  alongside losartan .  They are experiencing flare-ups in the pelvis and surgery site, for which they have resumed prednisone . They mention being cautious as they are 'working on fifteen years' since the surgery.  They are participating in a program at the [facility] that includes exercise and nutrition education. They experienced an injury on a machine two weeks ago, resulting in ongoing pain.  They express concern about their weight gain, attributing it to stress eating following their father's death in 02-13-2024. They report a history of weight loss prior to this due to decreased appetite following knee replacement surgery.  They mention a family history of diabetes, with their father and sister affected. They are concerned about their blood sugar levels, noting that they feel sleepy after eating and sometimes experience drops in blood sugar.   Patient Active Problem List   Diagnosis Date Noted   Hyperglycemia 04/23/2024   Hyperlipidemia 02/20/2023   Edema of right lower extremity 02/20/2023   Leukocytes in urine 02/20/2023   S/P total knee arthroplasty, right Feb 13, 2023   ASCVD (arteriosclerotic cardiovascular disease) 12/23/2022   Preventative health care 10/01/2022   Elevated serum creatinine  06/26/2021   COVID-19 04/26/2021   Diverticulitis 02/20/2021   Depression 07/10/2020   History of gastric bypass 07/10/2020   Has daytime drowsiness 05/09/2020   Other fatigue 02/29/2020   Short of breath on exertion 02/29/2020   Vitamin D  insufficiency 08/11/2018   Gastric bypass status for obesity 07/30/2018   Hives 06/07/2018   Primary osteoarthritis of right knee 01/23/2018   Primary osteoarthritis of both hands 01/23/2018   History of iritis 11/28/2017   History of total hip replacement, left 11/28/2017   Primary osteoarthritis of both knees 11/28/2017   Obstructive sleep apnea 03/08/2016   Seasonal allergies 05/14/2015   Obesity (BMI 30-39.9) 03/16/2014   Long-term current use of steroids 04/20/2013   Noninfectious gastroenteritis and colitis 03/17/2013   Calf pain 10/13/2012   Acute sprain or strain of cervical region 04/10/2012   LEG CRAMPS, NOCTURNAL 11/14/2010   OTHER SPECIFIED DISEASE OF WHITE BLOOD CELLS 07/23/2010   CONSTIPATION 04/06/2010   CORNEAL ABRASION, LEFT 01/22/2010   CHEST PAIN UNSPECIFIED 10/11/2009   BLOOD IN STOOL 12/02/2008   Ankylosing spondylitis (HCC) 03/31/2008   MUSCLE PAIN 03/31/2008   BARIATRIC SURGERY STATUS 01/14/2008   THYROID  CYST 06/26/2007   Class 2 severe obesity with serious comorbidity and body mass index (BMI) of 36.0 to 36.9 in adult [facility]) 06/26/2007   Essential hypertension 06/26/2007   GERD 06/26/2007   IBS 06/26/2007   LYMPHADENOPATHY 06/10/2007   History of thyroid  nodule 06/10/2007   Past Medical History:  Diagnosis Date   Abdominal pain, other specified site 03/31/2008   Centricity Description: FLANK PAIN, RIGHT Qualifier: Diagnosis of  By: [name] MD, Elsie  Centricity Description: ABDOMINAL PAIN OTHER SPECIFIED SITE Qualifier: Diagnosis of  By: Antonio ROSALEA Rockers     Acute sprain or strain of cervical region 04/10/2012   ALLERGIC RHINITIS 06/26/2007   Qualifier: Diagnosis of  By: Wilhemina RMA, Lucy     ANKYLOSING  SPONDYLITIS 03/31/2008   Qualifier: Diagnosis of  By: [name] MD, Elsie     Ankylosing spondylitis (HCC)    neck and spin   Ankylosing spondylitis of multiple sites in spine [facility])    Arthritis    Back pain    Bariatric surgery status 01/14/2008   Qualifier: Diagnosis of  By: Antonio ROSALEA Rockers     Blood in stool 12/02/2008   Qualifier: Diagnosis of  By: Antonio ROSALEA Rockers     Blood transfusion without reported diagnosis    Calf pain 10/13/2012   CHEST PAIN UNSPECIFIED 10/11/2009   Qualifier: Diagnosis of  By: Debrah MD, Lamar BIRCH    CONSTIPATION 04/06/2010   Qualifier: Diagnosis of  By: Antonio ROSALEA Rockers     CORNEAL ABRASION, LEFT 01/22/2010   Qualifier: Diagnosis of  By: Antonio ROSALEA Rockers     Gastritis    GERD 06/26/2007   Qualifier: Diagnosis of  By: Wilhemina RMA, Lucy     GERD (gastroesophageal reflux disease)    Hiatal hernia    Hyperlipidemia    Hypertension    no medications now   IBS 06/26/2007   Qualifier: Diagnosis of  By: Wilhemina RMA, Lucy     Joint pain    Lactose intolerance    LEG CRAMPS, NOCTURNAL 11/14/2010   Qualifier: Diagnosis of  By: Josetta Corrigan     Long-term current use of steroids 04/20/2013   LYMPHADENOPATHY 06/10/2007   Qualifier: Diagnosis of  By: Antonio ROSALEA Rockers     MORBID OBESITY 06/26/2007   Qualifier: Diagnosis of  By: Wilhemina SHARALYN Aspen     MUSCLE PAIN 03/31/2008   Qualifier: Diagnosis of  By: [name] MD, William     Noninfectious gastroenteritis and colitis 03/17/2013   Obesity (BMI 30-39.9) 03/16/2014   OSA (obstructive sleep apnea) 03/08/2016   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/23/2010   Qualifier: Diagnosis of  By: Antonio ROSALEA Rockers     Rheumatoid arthritis (HCC)    Seasonal allergies 05/14/2015   THYROID  CYST 06/26/2007   Qualifier: Diagnosis of  By: Wilhemina RMA, Lucy     THYROID  NODULE, HX OF 06/10/2007   Qualifier: Diagnosis of  By: Antonio ROSALEA Rockers     Vitamin D  deficiency    Past Surgical History:  Procedure Laterality Date    BARIATRIC SURGERY     BREAST REDUCTION SURGERY  1996   COLONOSCOPY  10/24/2009   kaplan-normal exam   ESOPHAGOGASTRODUODENOSCOPY     pelvis replacement  2010   TOTAL HIP ARTHROPLASTY     x 2 left, one revision   TOTAL KNEE ARTHROPLASTY Right 01/28/2023   Procedure: TOTAL KNEE ARTHROPLASTY;  Surgeon: Ernie Cough, MD;  Location: [facility];  Service: Orthopedics;  Laterality: Right;   UPPER GASTROINTESTINAL ENDOSCOPY     Social History   Tobacco Use   Smoking status: Never   Smokeless tobacco: Never  Vaping Use   Vaping status: Never Used  Substance Use Topics   Alcohol use: No    Alcohol/week: 0.0 standard drinks of alcohol   Drug use: No   Social History   Socioeconomic History   Marital status: Married    Spouse name: Santa Abdelrahman   Number  of children: Not on file   Years of education: Not on file   Highest education level: Master's degree (e.g., MA, MS, MEng, MEd, MSW, MBA)  Occupational History   Occupation: retired  Tobacco Use   Smoking status: Never   Smokeless tobacco: Never  Vaping Use   Vaping status: Never Used  Substance and Sexual Activity   Alcohol use: No    Alcohol/week: 0.0 standard drinks of alcohol   Drug use: No   Sexual activity: Yes  Other Topics Concern   Not on file  Social History Narrative   Occupation: Sports coach   Daily Caffeine use- 1 cup daily      Social Drivers of Corporate investment banker Strain: Low Risk  (04/22/2024)   Overall Financial Resource Strain (CARDIA)    Difficulty of Paying Living Expenses: Not hard at all  Food Insecurity: No Food Insecurity (04/22/2024)   Hunger Vital Sign    Worried About Running Out of Food in the Last Year: Never true    Ran Out of Food in the Last Year: Never true  Transportation Needs: No Transportation Needs (04/22/2024)   PRAPARE - Administrator, Civil Service (Medical): No    Lack of Transportation (Non-Medical): No  Physical Activity: Insufficiently Active  (04/22/2024)   Exercise Vital Sign    Days of Exercise per Week: 2 days    Minutes of Exercise per Session: 30 min  Stress: Stress Concern Present (04/22/2024)   [employer] of Occupational Health - Occupational Stress Questionnaire    Feeling of Stress: To some extent  Social Connections: Socially Integrated (04/22/2024)   Social Connection and Isolation Panel    Frequency of Communication with Friends and Family: More than three times a week    Frequency of Social Gatherings with Friends and Family: Not on file    Attends Religious Services: More than 4 times per year    Active Member of [employer] or Organizations: Yes    Attends Banker Meetings: More than 4 times per year    Marital Status: Married  Catering manager Violence: Unknown (06/06/2023)   Received from [facility]   HITS    Physically Hurt: Not on file    Insult or Talk Down To: Not on file    Threaten Physical Harm: Not on file    Scream or Curse: Not on file   Family Status  Relation Name Status   Mother  Alive   Father  Deceased at age 41   Sister  Alive   MGM  (Not Specified)   Neg Hx  (Not Specified)  No partnership data on file   Family History  Problem Relation Age of Onset   Hypertension Mother    High Cholesterol Mother    Diabetes Mother    Bladder Cancer Mother    Cancer Father    Arthritis Father    Hypertension Father    Diabetes Father    High Cholesterol Father    Dementia Father    Hypertension Sister    Diabetes Sister    Melanoma Maternal Grandmother    Breast cancer Maternal Grandmother        and Aunt   Colon cancer Neg Hx    Esophageal cancer Neg Hx    Rectal cancer Neg Hx    Stomach cancer Neg Hx    Colon polyps Neg Hx    Allergies  Allergen Reactions   Aspirin     REACTION: intolerance-gi  upset   Codeine Itching   Morphine Itching   Norvasc  [Amlodipine ]    Sulfa Antibiotics Other (See Comments)    Unsure of reaction   Sulfonamide Derivatives     Unsure  of reaction      Review of Systems  Constitutional:  Negative for fever and malaise/fatigue.  HENT:  Negative for congestion.   Eyes:  Negative for blurred vision.  Respiratory:  Negative for shortness of breath.   Cardiovascular:  Negative for chest pain, palpitations and leg swelling.  Gastrointestinal:  Negative for abdominal pain, blood in stool and nausea.  Genitourinary:  Negative for dysuria and frequency.  Musculoskeletal:  Negative for falls.  Skin:  Negative for rash.  Neurological:  Negative for dizziness, loss of consciousness and headaches.  Endo/Heme/Allergies:  Negative for environmental allergies.  Psychiatric/Behavioral:  Negative for depression. The patient is not nervous/anxious.       Objective:     BP 118/78 (BP Location: Left Arm, Patient Position: Sitting, Cuff Size: Large)   Pulse 70   Temp 97.9 F (36.6 C) (Oral)   Resp 18   Ht 5' 2 (1.575 m)   Wt 186 lb 3.2 oz (84.5 kg)   SpO2 99%   BMI 34.06 kg/m  BP Readings from Last 3 Encounters:  04/23/24 118/78  03/16/24 (!) 140/76  10/14/23 130/70   Wt Readings from Last 3 Encounters:  04/23/24 186 lb 3.2 oz (84.5 kg)  03/29/24 186 lb 12.8 oz (84.7 kg)  03/16/24 187 lb 12.8 oz (85.2 kg)   SpO2 Readings from Last 3 Encounters:  04/23/24 99%  03/16/24 99%  10/14/23 100%      Physical Exam Vitals and nursing note reviewed.  Constitutional:      General: She is not in acute distress.    Appearance: Normal appearance. She is well-developed.  HENT:     Head: Normocephalic and atraumatic.  Eyes:     General: No scleral icterus.       Right eye: No discharge.        Left eye: No discharge.  Cardiovascular:     Rate and Rhythm: Normal rate and regular rhythm.     Heart sounds: No murmur heard. Pulmonary:     Effort: Pulmonary effort is normal. No respiratory distress.     Breath sounds: Normal breath sounds.  Musculoskeletal:        General: Normal range of motion.     Cervical back: Normal  range of motion and neck supple.     Right lower leg: No edema.     Left lower leg: No edema.  Skin:    General: Skin is warm and dry.  Neurological:     Mental Status: She is alert and oriented to person, place, and time.  Psychiatric:        Mood and Affect: Mood normal.        Behavior: Behavior normal.        Thought Content: Thought content normal.        Judgment: Judgment normal.      No results found for any visits on 04/23/24.  Last CBC Lab Results  Component Value Date   WBC 4.4 10/14/2023   HGB 13.5 10/14/2023   HCT 41.2 10/14/2023   MCV 98.0 10/14/2023   MCH 31.2 01/30/2023   RDW 13.2 10/14/2023   PLT 328.0 10/14/2023   Last metabolic panel Lab Results  Component Value Date   GLUCOSE 94 10/14/2023   NA 141 10/14/2023  K 4.0 10/14/2023   CL 100 10/14/2023   CO2 31 10/14/2023   BUN 11 10/14/2023   CREATININE 0.65 10/14/2023   GFR 92.62 10/14/2023   CALCIUM  9.8 10/14/2023   PHOS 4.2 02/04/2007   PROT 6.8 10/14/2023   ALBUMIN 4.1 10/14/2023   LABGLOB 2.9 04/01/2022   AGRATIO 1.4 04/01/2022   BILITOT 0.7 10/14/2023   ALKPHOS 79 10/14/2023   AST 20 10/14/2023   ALT 17 10/14/2023   ANIONGAP 8 01/29/2023   Last lipids Lab Results  Component Value Date   CHOL 171 10/14/2023   HDL 89.00 10/14/2023   LDLCALC 73 10/14/2023   TRIG 47.0 10/14/2023   CHOLHDL 2 10/14/2023   Last hemoglobin A1c Lab Results  Component Value Date   HGBA1C 5.5 04/01/2022   Last thyroid  functions Lab Results  Component Value Date   TSH 2.32 10/14/2023   T3TOTAL 112 02/20/2021   Last vitamin D  Lab Results  Component Value Date   VD25OH 39.25 10/01/2022   Last vitamin B12 and Folate Lab Results  Component Value Date   VITAMINB12 710 10/01/2022   FOLATE >20.0 04/22/2022      The 10-year ASCVD risk score (Arnett DK, et al., 2019) is: 5.9%    Assessment & Plan:   Problem List Items Addressed This Visit       Unprioritized   GERD - Primary   Ankylosing  spondylitis (HCC)   Class 2 severe obesity with serious comorbidity and body mass index (BMI) of 36.0 to 36.9 in adult (HCC)   Vitamin D  insufficiency   Relevant Orders   VITAMIN D  25 Hydroxy (Vit-D Deficiency, Fractures)   Hyperlipidemia   Encourage heart healthy diet such as MIND or DASH diet, increase exercise, avoid trans fats, simple carbohydrates and processed foods, consider a krill or fish or flaxseed oil cap daily.        Relevant Orders   Comprehensive metabolic panel with GFR   CBC with Differential/Platelet   Lipid panel   Hyperglycemia   Check labs       Relevant Orders   Hemoglobin A1c   Essential hypertension   Well controlled, no changes to meds. Encouraged heart healthy diet such as the DASH diet and exercise as tolerated.         Relevant Orders   Comprehensive metabolic panel with GFR   CBC with Differential/Platelet   Lipid panel   Hemoglobin A1c   VITAMIN D  25 Hydroxy (Vit-D Deficiency, Fractures)   ASCVD (arteriosclerotic cardiovascular disease)   Check labs  Pt still resistant to starting a statin      Assessment and Plan Assessment & Plan Chronic Pain   They report flare-ups in the pelvis and surgery site, requiring prednisone . Persistent [facility]. They plan to resume water aerobics next week as a gentler form of exercise.  Hypertension   They switched from lisinopril  to losartan  due to chest discomfort with lisinopril . They are tolerating losartan  well with no issues reported after a few days of use. Blood pressure is well-controlled. Continue losartan  and chlorthalidone  unless advised otherwise by Dr. General.  Hyperlipidemia   A high Lp(a) level indicates a genetic predisposition to hyperlipidemia. They are apprehensive about starting cholesterol medication despite a calcium  score of 95, which suggests increased cardiovascular risk. Dietary changes cannot reverse the genetic  component of hyperlipidemia. Further discussion with Dr. General regarding statin therapy is needed.  Blood Sugar Variability  They experience postprandial somnolence and occasional hypoglycemic episodes, possibly related to gastric issues. There is concern about potential diabetes due to family history and recent weight gain. They are interested in monitoring blood sugar levels to understand fluctuations. Check A1c level and advise on obtaining an over-the-counter continuous glucose monitor (Starlet, Stelo, or Lingo) to monitor blood sugar levels.  General Health Maintenance   [facility] and are under the care of Dr. Marget for routine screenings, including mammograms. [facility] and ensure regular follow-up with Dr. Marget for routine screenings.    Return in about 6 months (around 10/24/2024), or if symptoms worsen or fail to improve, for annual exam, fasting.    Geryl Dohn R Lowne Chase, DO

## 2024-04-23 NOTE — Assessment & Plan Note (Signed)
 Well controlled, no changes to meds. Encouraged heart healthy diet such as the DASH diet and exercise as tolerated.

## 2024-04-23 NOTE — Assessment & Plan Note (Signed)
 Check labs

## 2024-04-23 NOTE — Assessment & Plan Note (Signed)
 Check labs  Pt still resistant to starting a statin

## 2024-04-23 NOTE — Assessment & Plan Note (Signed)
 Encourage heart healthy diet such as MIND or DASH diet, increase exercise, avoid trans fats, simple carbohydrates and processed foods, consider a krill or fish or flaxseed oil cap daily.

## 2024-04-23 NOTE — Patient Instructions (Signed)

## 2024-04-25 ENCOUNTER — Ambulatory Visit: Payer: Self-pay | Admitting: Family Medicine

## 2024-06-14 DIAGNOSIS — Z1231 Encounter for screening mammogram for malignant neoplasm of breast: Secondary | ICD-10-CM | POA: Diagnosis not present

## 2024-06-14 LAB — HM MAMMOGRAPHY

## 2024-06-22 ENCOUNTER — Ambulatory Visit (INDEPENDENT_AMBULATORY_CARE_PROVIDER_SITE_OTHER)

## 2024-06-22 VITALS — Ht 62.0 in | Wt 186.0 lb

## 2024-06-22 DIAGNOSIS — Z0000 Encounter for general adult medical examination without abnormal findings: Secondary | ICD-10-CM

## 2024-06-22 NOTE — Progress Notes (Signed)
 Subjective:   [name] is a 68 y.o. who presents for a Medicare Wellness preventive visit.  As a reminder, Annual Wellness Visits don't include a physical exam, and some assessments may be limited, especially if this visit is performed virtually. We may recommend an in-person follow-up visit with your provider if needed.  Visit Complete: Virtual I connected with  [name] on 06/22/24 by a audio enabled telemedicine application and verified that I am speaking with the correct person using two identifiers.  Patient Location: Home  Provider Location: Home Office  I discussed the limitations of evaluation and management by telemedicine. The patient expressed understanding and agreed to proceed.  Vital Signs: Because this visit was a virtual/telehealth visit, some criteria may be missing or patient reported. Any vitals not documented were not able to be obtained and vitals that have been documented are patient reported.    Persons Participating in Visit: Patient.  AWV Questionnaire: No: Patient Medicare AWV questionnaire was not completed prior to this visit.  Cardiac Risk Factors include: advanced age (>12men, >21 women);hypertension     Objective:    Today's Vitals   06/22/24 0813  Weight: 186 lb (84.4 kg)  Height: 5' 2 (1.575 m)   Body mass index is 34.02 kg/m.     06/22/2024    8:18 AM 06/24/2023    1:08 PM 01/28/2023   11:20 AM 01/28/2023    5:59 AM 01/15/2023    8:02 AM 06/17/2022    9:33 AM 07/25/2021   12:03 PM  Advanced Directives  Does Patient Have a Medical Advance Directive? No No No  No No No  Would patient like information on creating a medical advance directive? No - Patient declined No - Patient declined No - Patient declined No - Patient declined No - Patient declined  Yes (ED - Information included in AVS)    Current Medications (verified) Outpatient Encounter Medications as of 06/22/2024  Medication Sig   bariatric multiple vitamin  (ADVANCED MULTI EA) CHEW chewable tablet Chew 2 tablets by mouth daily.   Biotin w/ Vitamins C & E (HAIR SKIN & NAILS GUMMIES PO) Take 1 tablet by mouth daily.   Calcium  Carb-Cholecalciferol (CALTRATE 600+D3 SOFT PO) Take 1 tablet by mouth daily.   chlorthalidone  (HYGROTON ) 25 MG tablet Take 0.5 tablets (12.5 mg total) by mouth daily.   esomeprazole  (NEXIUM ) 40 MG capsule Take 1 capsule (40 mg total) by mouth daily as needed. Crack open capsule and ingest contents. Please keep your January appointment for further refills. Thank you   hydrOXYzine  (ATARAX ) 10 MG tablet Take 1 tablet (10 mg total) by mouth 3 (three) times daily as needed for itching.   losartan  (COZAAR ) 50 MG tablet Take 1 tablet (50 mg total) by mouth daily.   polyethylene glycol (MIRALAX  / GLYCOLAX ) 17 g packet Take 17 g by mouth 2 (two) times daily.   rosuvastatin  (CRESTOR ) 20 MG tablet Take 1 tablet (20 mg total) by mouth daily.   sucralfate  (CARAFATE ) 1 g tablet Take 1 g by mouth 4 (four) times daily -  with meals and at bedtime.   No facility-administered encounter medications on file as of 06/22/2024.    Allergies (verified) Aspirin, Codeine, Morphine, Norvasc  [amlodipine ], Sulfa antibiotics, and Sulfonamide derivatives   History: Past Medical History:  Diagnosis Date   Abdominal pain, other specified site 03/31/2008   Centricity Description: FLANK PAIN, RIGHT Qualifier: Diagnosis of  By: [name] MD, Elsie   Centricity Description: ABDOMINAL PAIN OTHER  SPECIFIED SITE Qualifier: Diagnosis of  By: Antonio ROSALEA Rockers     Acute sprain or strain of cervical region 04/10/2012   ALLERGIC RHINITIS 06/26/2007   Qualifier: Diagnosis of  By: Wilhemina RMA, Lucy     ANKYLOSING SPONDYLITIS 03/31/2008   Qualifier: Diagnosis of  By: [name] MD, Elsie     Ankylosing spondylitis (HCC)    neck and spin   Ankylosing spondylitis of multiple sites in spine [employer] [employer])    Arthritis    Back pain    Bariatric surgery status 01/14/2008   Qualifier:  Diagnosis of  By: Antonio ROSALEA Rockers     Blood in stool 12/02/2008   Qualifier: Diagnosis of  By: Antonio ROSALEA Rockers     Blood transfusion without reported diagnosis    Calf pain 10/13/2012   CHEST PAIN UNSPECIFIED 10/11/2009   Qualifier: Diagnosis of  By: Debrah MD, Lamar BIRCH    CONSTIPATION 04/06/2010   Qualifier: Diagnosis of  By: Antonio ROSALEA Rockers     CORNEAL ABRASION, LEFT 01/22/2010   Qualifier: Diagnosis of  By: Antonio ROSALEA Rockers     Gastritis    GERD 06/26/2007   Qualifier: Diagnosis of  By: Wilhemina RMA, Lucy     GERD (gastroesophageal reflux disease)    Hiatal hernia    Hyperlipidemia    Hypertension    no medications now   IBS 06/26/2007   Qualifier: Diagnosis of  By: Wilhemina RMA, Lucy     Joint pain    Lactose intolerance    LEG CRAMPS, NOCTURNAL 11/14/2010   Qualifier: Diagnosis of  By: Josetta Corrigan     Long-term current use of steroids 04/20/2013   LYMPHADENOPATHY 06/10/2007   Qualifier: Diagnosis of  By: Antonio ROSALEA Rockers     MORBID OBESITY 06/26/2007   Qualifier: Diagnosis of  By: Wilhemina SHARALYN Aspen     MUSCLE PAIN 03/31/2008   Qualifier: Diagnosis of  By: [name] MD, William     Noninfectious gastroenteritis and colitis 03/17/2013   Obesity (BMI 30-39.9) 03/16/2014   OSA (obstructive sleep apnea) 03/08/2016   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/23/2010   Qualifier: Diagnosis of  By: Antonio ROSALEA Rockers     Rheumatoid arthritis (HCC)    Seasonal allergies 05/14/2015   THYROID  CYST 06/26/2007   Qualifier: Diagnosis of  By: Wilhemina RMA, Lucy     THYROID  NODULE, HX OF 06/10/2007   Qualifier: Diagnosis of  By: Antonio ROSALEA Rockers     Vitamin D  deficiency    Past Surgical History:  Procedure Laterality Date   BARIATRIC SURGERY     BREAST REDUCTION SURGERY  1996   COLONOSCOPY  10/24/2009   kaplan-normal exam   ESOPHAGOGASTRODUODENOSCOPY     pelvis replacement  2010   TOTAL HIP ARTHROPLASTY     x 2 left, one revision   TOTAL KNEE ARTHROPLASTY Right 01/28/2023    Procedure: TOTAL KNEE ARTHROPLASTY;  Surgeon: Ernie Cough, MD;  Location: [facility];  Service: Orthopedics;  Laterality: Right;   UPPER GASTROINTESTINAL ENDOSCOPY     Family History  Problem Relation Age of Onset   Hypertension Mother    High Cholesterol Mother    Diabetes Mother    Bladder Cancer Mother    Cancer Father    Arthritis Father    Hypertension Father    Diabetes Father    High Cholesterol Father    Dementia Father    Hypertension Sister    Diabetes Sister    Melanoma Maternal Grandmother  Breast cancer Maternal Grandmother        and Aunt   Colon cancer Neg Hx    Esophageal cancer Neg Hx    Rectal cancer Neg Hx    Stomach cancer Neg Hx    Colon polyps Neg Hx    Social History   Socioeconomic History   Marital status: Married    Spouse name: Brennley Curtice   Number of children: Not on file   Years of education: Not on file   Highest education level: Master's degree (e.g., MA, MS, MEng, MEd, MSW, MBA)  Occupational History   Occupation: retired  Tobacco Use   Smoking status: Never   Smokeless tobacco: Never  Vaping Use   Vaping status: Never Used  Substance and Sexual Activity   Alcohol use: No    Alcohol/week: 0.0 standard drinks of alcohol   Drug use: No   Sexual activity: Yes  Other Topics Concern   Not on file  Social History Narrative   Occupation: Sports coach   Daily Caffeine use- 1 cup daily      Social Drivers of Corporate investment banker Strain: Low Risk  (06/22/2024)   Overall Financial Resource Strain (CARDIA)    Difficulty of Paying Living Expenses: Not hard at all  Food Insecurity: No Food Insecurity (06/22/2024)   Hunger Vital Sign    Worried About Running Out of Food in the Last Year: Never true    Ran Out of Food in the Last Year: Never true  Transportation Needs: No Transportation Needs (06/22/2024)   PRAPARE - Administrator, Civil Service (Medical): No    Lack of Transportation (Non-Medical): No  Physical  Activity: Inactive (06/22/2024)   Exercise Vital Sign    Days of Exercise per Week: 0 days    Minutes of Exercise per Session: 0 min  Stress: No Stress Concern Present (06/22/2024)   [employer] of Occupational Health - Occupational Stress Questionnaire    Feeling of Stress: Not at all  Recent Concern: Stress - Stress Concern Present (04/22/2024)   [employer] of Occupational Health - Occupational Stress Questionnaire    Feeling of Stress: To some extent  Social Connections: Socially Integrated (06/22/2024)   Social Connection and Isolation Panel    Frequency of Communication with Friends and Family: More than three times a week    Frequency of Social Gatherings with Friends and Family: More than three times a week    Attends Religious Services: More than 4 times per year    Active Member of [employer] or Organizations: Yes    Attends Engineer, structural: More than 4 times per year    Marital Status: Married    Tobacco Counseling Counseling given: Not Answered    Clinical Intake:  Pre-visit preparation completed: Yes  Pain : No/denies pain     BMI - recorded: 34.02 Nutritional Status: BMI > 30  Obese Nutritional Risks: None Diabetes: No  Lab Results  Component Value Date   HGBA1C 5.9 04/23/2024   HGBA1C 5.5 04/01/2022   HGBA1C 5.6 08/27/2021     How often do you need to have someone help you when you read instructions, pamphlets, or other written materials from your doctor or pharmacy?: 1 - Never  Interpreter Needed?: No  Information entered by :: Courtney Blush LPN   Activities of Daily Living     06/22/2024    8:17 AM 06/24/2023    1:02 PM  In your present state of health,  do you have any difficulty performing the following activities:  Hearing? 0 0  Vision? 0 0  Difficulty concentrating or making decisions? 0 1  Comment  memory  Walking or climbing stairs? 1 1  Comment Uses a Cane   Dressing or bathing? 0 0  Doing errands, shopping? 0 0   Preparing Food and eating ? N N  Using the Toilet? N N  In the past six months, have you accidently leaked urine? N N  Do you have problems with loss of bowel control? N N  Managing your Medications? N N  Managing your Finances? N N  Housekeeping or managing your Housekeeping? N N    Patient Care Team: Antonio Meth, Jamee SAUNDERS, DO as PCP - General Marget Lenis, MD as Consulting Physician (Obstetrics and Gynecology) Zora, Lenis, MD as Referring Physician (Surgery) Dolphus Reiter, MD as Consulting Physician (Rheumatology) Jeffrie Oneil BROCKS, MD as Consulting Physician (Cardiology) Armbruster, Elspeth SQUIBB, MD as Consulting Physician (Gastroenterology) Trixie File, MD as Consulting Physician (Endocrinology) Phipps, C. Lenis, MD (Otolaryngology) Ernie Cough, MD as Consulting Physician (Orthopedic Surgery) Neysa Reggy BIRCH, MD as Consulting Physician (Pulmonary Disease)  I have updated your Care Teams any recent Medical Services you may have received from other providers in the past year.     Assessment:   This is a routine wellness examination for Aragon.  Hearing/Vision screen Hearing Screening - Comments:: Denies hearing difficulties   Vision Screening - Comments:: Wears rx glasses - up to date with routine eye exams with  Dr Charmayne   Goals Addressed               This Visit's Progress     Increase physical activity (pt-stated)        Get more active.       Depression Screen     06/22/2024    8:17 AM 06/24/2023    1:04 PM 02/20/2023    8:31 AM 10/01/2022   10:56 AM 06/17/2022    9:35 AM 05/01/2021    8:25 AM 02/20/2021    6:57 AM  PHQ 2/9 Scores  PHQ - 2 Score 0 0 0 0 0 0 0  PHQ- 9 Score   0 0   6    Fall Risk     06/22/2024    8:18 AM 06/24/2023    1:06 PM 06/17/2022    9:34 AM 05/01/2021    8:24 AM 06/08/2019    8:08 AM  Fall Risk   Falls in the past year? 0 0 0 0 1   Number falls in past yr: 0 0 0 0 1   Injury with Fall? 0 0 0 0 0  Risk for fall due to :  No Fall Risks No Fall Risks   --   Risk for fall due to: Comment     slipped x2   Follow up Falls evaluation completed Falls evaluation completed Falls prevention discussed  Falls prevention discussed  Education provided;Falls prevention discussed      Data saved with a previous flowsheet row definition    MEDICARE RISK AT HOME:  Medicare Risk at Home Any stairs in or around the home?: Yes If so, are there any without handrails?: No Home free of loose throw rugs in walkways, pet beds, electrical cords, etc?: Yes Adequate lighting in your home to reduce risk of falls?: Yes Life alert?: No Use of a cane, walker or w/c?: Yes Grab bars in the bathroom?: Yes Shower chair or bench  in shower?: Yes Elevated toilet seat or a handicapped toilet?: Yes  TIMED UP AND GO:  Was the test performed?  No  Cognitive Function: 6CIT completed    03/05/2016   10:05 AM  MMSE - Mini Mental State Exam  Orientation to time 5   Orientation to Place 5   Registration 3   Attention/ Calculation 5   Recall 3   Language- name 2 objects 2   Language- repeat 1  Language- follow 3 step command 3   Language- read & follow direction 1   Write a sentence 1   Copy design 1   Total score 30      Data saved with a previous flowsheet row definition        06/22/2024    8:19 AM 06/24/2023    1:10 PM 06/17/2022    9:40 AM 06/08/2019    8:13 AM  6CIT Screen  What Year? 0 points 0 points 0 points 0 points  What month? 0 points 0 points 0 points 0 points  What time? 0 points 0 points 0 points 0 points  Count back from 20 0 points 0 points 0 points 0 points  Months in reverse 0 points 0 points 0 points 0 points  Repeat phrase 0 points 0 points 0 points 4 points  Total Score 0 points 0 points 0 points 4 points    Immunizations Immunization History  Administered Date(s) Administered   Influenza Whole 06/22/2010   PFIZER(Purple Top)SARS-COV-2 Vaccination 11/12/2019, 12/07/2019, 09/14/2020   PNEUMOCOCCAL  CONJUGATE-20 10/14/2023   Pfizer Covid-19 Vaccine Bivalent Booster 32yrs & up 10/05/2021   Pfizer(Comirnaty )Fall Seasonal Vaccine 12 years and older 09/03/2022   Tdap 03/16/2014    Screening Tests Health Maintenance  Topic Date Due   Zoster Vaccines- Shingrix (1 of 2) Never done   Cervical Cancer Screening (HPV/Pap Cotest)  08/02/2017   DEXA SCAN  Never done   DTaP/Tdap/Td (2 - Td or Tdap) 03/16/2024   Influenza Vaccine  04/30/2024   COVID-19 Vaccine (6 - 2025-26 season) 05/31/2024   Medicare Annual Wellness (AWV)  06/22/2025   Mammogram  06/14/2026   Colonoscopy  04/13/2030   Pneumococcal Vaccine: 50+ Years  Completed   Hepatitis C Screening  Completed   HIV Screening  Completed   Hepatitis B Vaccines 19-59 Average Risk  Aged Out   HPV VACCINES  Aged Out   Meningococcal B Vaccine  Aged Out    Health Maintenance Items Addressed:   Additional Screening:  Vision Screening: Recommended annual ophthalmology exams for early detection of glaucoma and other disorders of the eye. Is the patient up to date with their annual eye exam?  Yes  Who is the provider or what is the name of the office in which the patient attends annual eye exams? Dr Charmayne  Dental Screening: Recommended annual dental exams for proper oral hygiene  Community Resource Referral / Chronic Care Management: CRR required this visit?  No   CCM required this visit?  No   Plan:    I have personally reviewed and noted the following in the patient's chart:   Medical and social history Use of alcohol, tobacco or illicit drugs  Current medications and supplements including opioid prescriptions. Patient is not currently taking opioid prescriptions. Functional ability and status Nutritional status Physical activity Advanced directives List of other physicians Hospitalizations, surgeries, and ER visits in previous 12 months Vitals Screenings to include cognitive, depression, and falls Referrals and  appointments  In addition, I  have reviewed and discussed with patient certain preventive protocols, quality metrics, and best practice recommendations. A written personalized care plan for preventive services as well as general preventive health recommendations were provided to patient.   SHUNNA MIKAELIAN, LPN   [id number]   After Visit Summary: (MyChart) Due to this being a telephonic visit, the after visit summary with patients personalized plan was offered to patient via MyChart   Notes: Nothing significant to report at this time.

## 2024-06-22 NOTE — Patient Instructions (Addendum)
 [name],  Thank you for taking the time for your Medicare Wellness Visit. I appreciate your continued commitment to your health goals. Please review the care plan we discussed, and feel free to reach out if I can assist you further.  Medicare recommends these wellness visits once per year to help you and your care team stay ahead of potential health issues. These visits are designed to focus on prevention, allowing your provider to concentrate on managing your acute and chronic conditions during your regular appointments.  Please note that Annual Wellness Visits do not include a physical exam. Some assessments may be limited, especially if the visit was conducted virtually. If needed, we may recommend a separate in-person follow-up with your provider.  Ongoing Care Seeing your primary care provider every 3 to 6 months helps us  monitor your health and provide consistent, personalized care.   Referrals If a referral was made during today's visit and you haven't received any updates within two weeks, please contact the referred provider directly to check on the status.  Recommended Screenings:  Health Maintenance  Topic Date Due   Zoster (Shingles) Vaccine (1 of 2) Never done   Pap with HPV screening  08/02/2017   DEXA scan (bone density measurement)  Never done   DTaP/Tdap/Td vaccine (2 - Td or Tdap) 03/16/2024   Flu Shot  04/30/2024   COVID-19 Vaccine (6 - 2025-26 season) 05/31/2024   Medicare Annual Wellness Visit  06/22/2025   Breast Cancer Screening  06/14/2026   Colon Cancer Screening  04/13/2030   Pneumococcal Vaccine for age over 60  Completed   Hepatitis C Screening  Completed   HIV Screening  Completed   Hepatitis B Vaccine  Aged Out   HPV Vaccine  Aged Out   Meningitis B Vaccine  Aged Out       06/22/2024    8:18 AM  Advanced Directives  Does Patient Have a Medical Advance Directive? No  Would patient like information on creating a medical advance directive? No -  Patient declined   Advance Care Planning is important because it: Ensures you receive medical care that aligns with your values, goals, and preferences. Provides guidance to your family and loved ones, reducing the emotional burden of decision-making during critical moments.  Vision: Annual vision screenings are recommended for early detection of glaucoma, cataracts, and diabetic retinopathy. These exams can also reveal signs of chronic conditions such as diabetes and high blood pressure.  Dental: Annual dental screenings help detect early signs of oral cancer, gum disease, and other conditions linked to overall health, including heart disease and diabetes.  Please see the attached documents for additional preventive care recommendations.

## 2024-06-24 DIAGNOSIS — T8484XD Pain due to internal orthopedic prosthetic devices, implants and grafts, subsequent encounter: Secondary | ICD-10-CM | POA: Diagnosis not present

## 2024-06-24 DIAGNOSIS — Z4789 Encounter for other orthopedic aftercare: Secondary | ICD-10-CM | POA: Diagnosis not present

## 2024-06-24 DIAGNOSIS — Z96651 Presence of right artificial knee joint: Secondary | ICD-10-CM | POA: Diagnosis not present

## 2024-06-30 NOTE — Progress Notes (Unsigned)
 Pinky stopped attending PREP Class on July 22nd, so did not complete the program.

## 2024-07-01 ENCOUNTER — Ambulatory Visit: Payer: Self-pay

## 2024-07-01 NOTE — Telephone Encounter (Signed)
 FYI Only or Action Required?: FYI only for provider.  Patient was last seen in primary care on 04/23/2024 by Antonio Meth, Jamee SAUNDERS, DO.  Called Nurse Triage reporting Neck Pain.  Symptoms began a week ago.  Interventions attempted: OTC medications: Tylenol , Motrin.  Symptoms are: unchanged.  Triage Disposition: See PCP When Office is Open (Within 3 Days)  Patient/caregiver understands and will follow disposition?: Yes Reason for Disposition  Neck pain present > 2 weeks  Answer Assessment - Initial Assessment Questions Patient stated has Ankylosing spondylitis and this is making it worse. Takes Tylenol  takes the edge off, but not entirely.   1. ONSET: When did the pain begin?      At least a week ago  2. LOCATION: Where does it hurt?      Left side of neck into upper part of left arm  3. PATTERN Does the pain come and go, or has it been constant since it started?      Comes and goes  4. SEVERITY: How bad is the pain?  (Scale 0-10; or none or slight stiffness, mild, moderate, severe)     Mild to moderate  5. RADIATION: Does the pain go anywhere else, shoot into your arms?     Upper part of left arm  6. CORD SYMPTOMS: Any weakness or numbness of the arms or legs?     No  7. CAUSE: What do you think is causing the neck pain?     Unsure  8. NECK OVERUSE: Any recent activities that involved turning or twisting the neck?     No  9. OTHER SYMPTOMS: Do you have any other symptoms? (e.g., headache, fever, chest pain, difficulty breathing, neck swelling)     No  Protocols used: Neck Pain or Stiffness-A-AH  Copied from CRM #[id number]. Topic: Clinical - Red Word Triage >> Jul 01, 2024  9:35 AM [place] E wrote: [facility] that prompted transfer to Nurse Triage: Left side neck pain, radiating into left shoulder, going on for 1 week.

## 2024-07-02 ENCOUNTER — Encounter: Payer: Self-pay | Admitting: Family Medicine

## 2024-07-02 ENCOUNTER — Ambulatory Visit (HOSPITAL_BASED_OUTPATIENT_CLINIC_OR_DEPARTMENT_OTHER)
Admission: RE | Admit: 2024-07-02 | Discharge: 2024-07-02 | Disposition: A | Discharge status: Home / Self Care | Source: Ambulatory Visit | Attending: Family Medicine | Admitting: Family Medicine

## 2024-07-02 ENCOUNTER — Telehealth: Payer: Self-pay

## 2024-07-02 ENCOUNTER — Ambulatory Visit: Admitting: Family Medicine

## 2024-07-02 VITALS — BP 110/78 | HR 80 | Temp 97.6°F | Resp 18 | Ht 62.0 in | Wt 194.0 lb

## 2024-07-02 DIAGNOSIS — M542 Cervicalgia: Secondary | ICD-10-CM

## 2024-07-02 DIAGNOSIS — M4802 Spinal stenosis, cervical region: Secondary | ICD-10-CM | POA: Diagnosis not present

## 2024-07-02 DIAGNOSIS — Z6836 Body mass index (BMI) 36.0-36.9, adult: Secondary | ICD-10-CM

## 2024-07-02 DIAGNOSIS — E66812 Obesity, class 2: Secondary | ICD-10-CM

## 2024-07-02 DIAGNOSIS — M450 Ankylosing spondylitis of multiple sites in spine: Secondary | ICD-10-CM

## 2024-07-02 DIAGNOSIS — M47812 Spondylosis without myelopathy or radiculopathy, cervical region: Secondary | ICD-10-CM | POA: Diagnosis not present

## 2024-07-02 MED ORDER — CYCLOBENZAPRINE HCL 10 MG PO TABS: 10.0000 mg | ORAL_TABLET | Freq: Three times a day (TID) | ORAL | 0 refills | Status: AC | PRN

## 2024-07-02 MED ORDER — TIRZEPATIDE-WEIGHT MANAGEMENT 2.5 MG/0.5ML ~~LOC~~ SOLN
0.2500 mg | SUBCUTANEOUS | 0 refills | Status: DC
Start: 2024-07-02 — End: 2024-08-24

## 2024-07-02 MED ORDER — PREDNISONE 10 MG PO TABS: ORAL_TABLET | ORAL | 0 refills | Status: DC

## 2024-07-02 NOTE — Telephone Encounter (Signed)
 Copied from CRM [id number]. Topic: Clinical - Medication Question >> Jul 02, 2024 12:19 PM Mercedes MATSU wrote: Reason for CRM: Phamracy has some questions about the dosage and instructions for Zepbound. Pharmacy is requesting a call back [phone] no ext.

## 2024-07-02 NOTE — Telephone Encounter (Signed)
Pharmacy notified of instructions.

## 2024-07-02 NOTE — Progress Notes (Signed)
 Subjective:    Patient ID: [name], female    DOB: 1956/05/21, 68 y.o.   MRN: [id number]  Chief Complaint  Patient presents with   Neck Pain    Left side, x1 week, Pt states taking leftover Prednisone      HPI Patient is in today for neck pain.  Discussed the use of AI scribe software for clinical note transcription with the patient, who gave verbal consent to proceed.  History of Present Illness [name] is a 68 year old female with ankylosing spondylitis who presents with severe neck pain and limited motion.  She has been experiencing severe neck pain for over a week, described as 'killing me'. The pain is localized to one side of her neck and radiates from her head down. No associated chest pain or radiation down the arm. She has been taking leftover prednisone , one to two tablets, which provides some relief, but she feels it may not be sufficient. The pain causes limited motion in her neck, which is concerning given her history of restricted movement due to ankylosing spondylitis. She wakes up screaming from the pain, indicating its severity.  Her history of ankylosing spondylitis primarily affects her neck, with fused vertebrae. She is concerned about the possibility of further fusion or reactivation of symptoms. Her father had a similar condition with a 'totally frozen stiff' neck, and she is worried about losing the little mobility she has, which is important for daily activities like driving.  Additionally, she reports ongoing issues with her knee following a knee replacement surgery in April of the previous year. She continues to struggle with regaining range of motion and experiences significant pain and tingling. A recent follow-up led to fluid being aspirated from the knee, and she is awaiting results. The patient reports that Dr. Jonette expressed concern about possible issues with the knee replacement components, and she is awaiting further results.  Her medication  regimen includes prednisone  for flare-ups. She has not had neck surgery in the past.    Past Medical History:  Diagnosis Date   Abdominal pain, other specified site 03/31/2008   Centricity Description: FLANK PAIN, RIGHT Qualifier: Diagnosis of  By: [name] MD, Elsie   Centricity Description: ABDOMINAL PAIN OTHER SPECIFIED SITE Qualifier: Diagnosis of  By: Antonio ROSALEA Rockers     Acute sprain or strain of cervical region 04/10/2012   ALLERGIC RHINITIS 06/26/2007   Qualifier: Diagnosis of  By: Wilhemina RMA, Lucy     ANKYLOSING SPONDYLITIS 03/31/2008   Qualifier: Diagnosis of  By: [name] MD, William     Ankylosing spondylitis (HCC)    neck and spin   Ankylosing spondylitis of multiple sites in spine [facility])    Arthritis    Back pain    Bariatric surgery status 01/14/2008   Qualifier: Diagnosis of  By: Antonio ROSALEA Rockers     Blood in stool 12/02/2008   Qualifier: Diagnosis of  By: Antonio ROSALEA Rockers     Blood transfusion without reported diagnosis    Calf pain 10/13/2012   CHEST PAIN UNSPECIFIED 10/11/2009   Qualifier: Diagnosis of  By: Debrah MD, Lamar BIRCH    CONSTIPATION 04/06/2010   Qualifier: Diagnosis of  By: Antonio ROSALEA Rockers     CORNEAL ABRASION, LEFT 01/22/2010   Qualifier: Diagnosis of  By: Antonio ROSALEA Rockers     Gastritis    GERD 06/26/2007   Qualifier: Diagnosis of  By: Wilhemina RMA, Lucy     GERD (gastroesophageal reflux disease)  Hiatal hernia    Hyperlipidemia    Hypertension    no medications now   IBS 06/26/2007   Qualifier: Diagnosis of  By: Wilhemina RMA, Lucy     Joint pain    Lactose intolerance    LEG CRAMPS, NOCTURNAL 11/14/2010   Qualifier: Diagnosis of  By: Josetta Corrigan     Long-term current use of steroids 04/20/2013   LYMPHADENOPATHY 06/10/2007   Qualifier: Diagnosis of  By: Antonio ROSALEA Rockers     MORBID OBESITY 06/26/2007   Qualifier: Diagnosis of  By: Wilhemina SHARALYN Aspen     MUSCLE PAIN 03/31/2008   Qualifier: Diagnosis of  By: [name] MD, William     Noninfectious  gastroenteritis and colitis 03/17/2013   Obesity (BMI 30-39.9) 03/16/2014   OSA (obstructive sleep apnea) 03/08/2016   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/23/2010   Qualifier: Diagnosis of  By: Antonio ROSALEA Rockers     Rheumatoid arthritis (HCC)    Seasonal allergies 05/14/2015   THYROID  CYST 06/26/2007   Qualifier: Diagnosis of  By: Wilhemina RMA, Lucy     THYROID  NODULE, HX OF 06/10/2007   Qualifier: Diagnosis of  By: Antonio ROSALEA Rockers     Vitamin D  deficiency     Past Surgical History:  Procedure Laterality Date   BARIATRIC SURGERY     BREAST REDUCTION SURGERY  1996   COLONOSCOPY  10/24/2009   kaplan-normal exam   ESOPHAGOGASTRODUODENOSCOPY     pelvis replacement  2010   TOTAL HIP ARTHROPLASTY     x 2 left, one revision   TOTAL KNEE ARTHROPLASTY Right 01/28/2023   Procedure: TOTAL KNEE ARTHROPLASTY;  Surgeon: Ernie Cough, MD;  Location: [facility];  Service: Orthopedics;  Laterality: Right;   UPPER GASTROINTESTINAL ENDOSCOPY      Family History  Problem Relation Age of Onset   Hypertension Mother    High Cholesterol Mother    Diabetes Mother    Bladder Cancer Mother    Cancer Father    Arthritis Father    Hypertension Father    Diabetes Father    High Cholesterol Father    Dementia Father    Hypertension Sister    Diabetes Sister    Melanoma Maternal Grandmother    Breast cancer Maternal Grandmother        and Aunt   Colon cancer Neg Hx    Esophageal cancer Neg Hx    Rectal cancer Neg Hx    Stomach cancer Neg Hx    Colon polyps Neg Hx     Social History   Socioeconomic History   Marital status: Married    Spouse name: Lanika Colgate   Number of children: Not on file   Years of education: Not on file   Highest education level: Master's degree (e.g., MA, MS, MEng, MEd, MSW, MBA)  Occupational History   Occupation: retired  Tobacco Use   Smoking status: Never   Smokeless tobacco: Never  Vaping Use   Vaping status: Never Used  Substance and Sexual  Activity   Alcohol use: No    Alcohol/week: 0.0 standard drinks of alcohol   Drug use: No   Sexual activity: Yes  Other Topics Concern   Not on file  Social History Narrative   Occupation: Sports coach   Daily Caffeine use- 1 cup daily      Social Drivers of Health   Financial Resource Strain: Low Risk  (06/22/2024)   Overall Financial Resource Strain (CARDIA)    Difficulty  of Paying Living Expenses: Not hard at all  Food Insecurity: No Food Insecurity (06/22/2024)   Hunger Vital Sign    Worried About Running Out of Food in the Last Year: Never true    Ran Out of Food in the Last Year: Never true  Transportation Needs: No Transportation Needs (06/22/2024)   PRAPARE - Administrator, Civil Service (Medical): No    Lack of Transportation (Non-Medical): No  Physical Activity: Inactive (06/22/2024)   Exercise Vital Sign    Days of Exercise per Week: 0 days    Minutes of Exercise per Session: 0 min  Stress: No Stress Concern Present (06/22/2024)   [employer] of Occupational Health - Occupational Stress Questionnaire    Feeling of Stress: Not at all  Recent Concern: Stress - Stress Concern Present (04/22/2024)   [employer] of Occupational Health - Occupational Stress Questionnaire    Feeling of Stress: To some extent  Social Connections: Socially Integrated (06/22/2024)   Social Connection and Isolation Panel    Frequency of Communication with Friends and Family: More than three times a week    Frequency of Social Gatherings with Friends and Family: More than three times a week    Attends Religious Services: More than 4 times per year    Active Member of [employer] or Organizations: Yes    Attends Engineer, structural: More than 4 times per year    Marital Status: Married  Catering manager Violence: Not At Risk (06/22/2024)   Humiliation, Afraid, Rape, and Kick questionnaire    Fear of Current or Ex-Partner: No    Emotionally Abused: No     Physically Abused: No    Sexually Abused: No    Outpatient Medications Prior to Visit  Medication Sig Dispense Refill   bariatric multiple vitamin (ADVANCED MULTI EA) CHEW chewable tablet Chew 2 tablets by mouth daily.     Biotin w/ Vitamins C & E (HAIR SKIN & NAILS GUMMIES PO) Take 1 tablet by mouth daily.     Calcium  Carb-Cholecalciferol (CALTRATE 600+D3 SOFT PO) Take 1 tablet by mouth daily.     chlorthalidone  (HYGROTON ) 25 MG tablet Take 0.5 tablets (12.5 mg total) by mouth daily. 15 tablet 2   esomeprazole  (NEXIUM ) 40 MG capsule Take 1 capsule (40 mg total) by mouth daily as needed. Crack open capsule and ingest contents. Please keep your January appointment for further refills. Thank you 90 capsule 1   hydrOXYzine  (ATARAX ) 10 MG tablet Take 1 tablet (10 mg total) by mouth 3 (three) times daily as needed for itching. 30 tablet 0   losartan  (COZAAR ) 50 MG tablet Take 1 tablet (50 mg total) by mouth daily. 30 tablet 2   polyethylene glycol (MIRALAX  / GLYCOLAX ) 17 g packet Take 17 g by mouth 2 (two) times daily. 14 each 0   rosuvastatin  (CRESTOR ) 20 MG tablet Take 1 tablet (20 mg total) by mouth daily. 30 tablet 5   sucralfate  (CARAFATE ) 1 g tablet Take 1 g by mouth 4 (four) times daily -  with meals and at bedtime.     No facility-administered medications prior to visit.    Allergies  Allergen Reactions   Aspirin     REACTION: intolerance-gi upset   Codeine Itching   Morphine Itching   Norvasc  [Amlodipine ]    Sulfa Antibiotics Other (See Comments)    Unsure of reaction   Sulfonamide Derivatives     Unsure of reaction    Review of Systems  Constitutional:  Negative for fever and malaise/fatigue.  HENT:  Negative for congestion.   Eyes:  Negative for blurred vision.  Respiratory:  Negative for shortness of breath.   Cardiovascular:  Negative for chest pain, palpitations and leg swelling.  Gastrointestinal:  Negative for abdominal pain, blood in stool and nausea.   Genitourinary:  Negative for dysuria and frequency.  Musculoskeletal:  Positive for neck pain. Negative for falls.  Skin:  Negative for rash.  Neurological:  Negative for dizziness, loss of consciousness and headaches.  Endo/Heme/Allergies:  Negative for environmental allergies.  Psychiatric/Behavioral:  Negative for depression. The patient is not nervous/anxious.        Objective:    Physical Exam Vitals and nursing note reviewed.  Constitutional:      General: She is not in acute distress.    Appearance: Normal appearance. She is well-developed.  HENT:     Head: Normocephalic and atraumatic.  Eyes:     General: No scleral icterus.       Right eye: No discharge.        Left eye: No discharge.  Cardiovascular:     Rate and Rhythm: Normal rate and regular rhythm.     Heart sounds: No murmur heard. Pulmonary:     Effort: Pulmonary effort is normal. No respiratory distress.     Breath sounds: Normal breath sounds.  Musculoskeletal:     Cervical back: Neck supple. Spasms and tenderness present. Decreased range of motion.     Right lower leg: No edema.     Left lower leg: No edema.  Skin:    General: Skin is warm and dry.  Neurological:     Mental Status: She is alert and oriented to person, place, and time.  Psychiatric:        Mood and Affect: Mood normal.        Behavior: Behavior normal.        Thought Content: Thought content normal.        Judgment: Judgment normal.     BP 110/78 (BP Location: Right Arm, Patient Position: Sitting, Cuff Size: Large)   Pulse 80   Temp 97.6 F (36.4 C) (Oral)   Resp 18   Ht 5' 2 (1.575 m)   Wt 194 lb (88 kg)   SpO2 97%   BMI 35.48 kg/m  Wt Readings from Last 3 Encounters:  07/02/24 194 lb (88 kg)  06/22/24 186 lb (84.4 kg)  04/23/24 186 lb 3.2 oz (84.5 kg)    Diabetic Foot Exam - Simple   No data filed    Lab Results  Component Value Date   WBC 5.2 04/23/2024   HGB 13.6 04/23/2024   HCT 41.3 04/23/2024   PLT 337.0  04/23/2024   GLUCOSE 91 04/23/2024   CHOL 169 04/23/2024   TRIG 60.0 04/23/2024   HDL 89.40 04/23/2024   LDLCALC 67 04/23/2024   ALT 16 04/23/2024   AST 18 04/23/2024   NA 137 04/23/2024   K 4.0 04/23/2024   CL 100 04/23/2024   CREATININE 0.76 04/23/2024   BUN 14 04/23/2024   CO2 29 04/23/2024   TSH 2.32 10/14/2023   INR 1.2 09/26/2008   HGBA1C 5.9 04/23/2024    Lab Results  Component Value Date   TSH 2.32 10/14/2023   Lab Results  Component Value Date   WBC 5.2 04/23/2024   HGB 13.6 04/23/2024   HCT 41.3 04/23/2024   MCV 95.4 04/23/2024   PLT 337.0 04/23/2024   Lab  Results  Component Value Date   NA 137 04/23/2024   K 4.0 04/23/2024   CO2 29 04/23/2024   GLUCOSE 91 04/23/2024   BUN 14 04/23/2024   CREATININE 0.76 04/23/2024   BILITOT 0.7 04/23/2024   ALKPHOS 75 04/23/2024   AST 18 04/23/2024   ALT 16 04/23/2024   PROT 6.8 04/23/2024   ALBUMIN 4.0 04/23/2024   CALCIUM  9.6 04/23/2024   ANIONGAP 8 01/29/2023   EGFR 98 09/13/2022   GFR 82.12 04/23/2024   Lab Results  Component Value Date   CHOL 169 04/23/2024   Lab Results  Component Value Date   HDL 89.40 04/23/2024   Lab Results  Component Value Date   LDLCALC 67 04/23/2024   Lab Results  Component Value Date   TRIG 60.0 04/23/2024   Lab Results  Component Value Date   CHOLHDL 2 04/23/2024   Lab Results  Component Value Date   HGBA1C 5.9 04/23/2024       Assessment & Plan:  Neck pain -     DG Cervical Spine Complete; Future -     predniSONE ; TAKE 3 TABLETS PO QD FOR 3 DAYS THEN TAKE 2 TABLETS PO QD FOR 3 DAYS THEN TAKE 1 TABLET PO QD FOR 3 DAYS THEN TAKE 1/2 TAB PO QD FOR 3 DAYS  Dispense: 20 tablet; Refill: 0 -     Cyclobenzaprine  HCl; Take 1 tablet (10 mg total) by mouth 3 (three) times daily as needed for muscle spasms.  Dispense: 30 tablet; Refill: 0  Ankylosing spondylitis of multiple sites in spine (HCC) -     Ambulatory referral to Rheumatology -     predniSONE ; TAKE 3 TABLETS  PO QD FOR 3 DAYS THEN TAKE 2 TABLETS PO QD FOR 3 DAYS THEN TAKE 1 TABLET PO QD FOR 3 DAYS THEN TAKE 1/2 TAB PO QD FOR 3 DAYS  Dispense: 20 tablet; Refill: 0 -     Cyclobenzaprine  HCl; Take 1 tablet (10 mg total) by mouth 3 (three) times daily as needed for muscle spasms.  Dispense: 30 tablet; Refill: 0  Class 2 severe obesity due to excess calories with serious comorbidity and body mass index (BMI) of 36.0 to 36.9 in adult -     Tirzepatide-Weight Management; Inject 0.25 mg into the skin once a week.  Dispense: 2 mL; Refill: 0  Assessment and Plan Assessment & Plan Ankylosing spondylitis of cervical region   Chronic ankylosing spondylitis in the cervical region has recently worsened, causing severe pain, limited range of motion, muscle tightness, and episodes of waking up screaming due to pain. There are concerns about potential progression, further fusion, and loss of mobility. No recent imaging has been done to assess the current status. Order a cervical spine X-ray to evaluate the condition. Prescribe a prednisone  taper to control inflammation and a muscle relaxer for muscle tightness. Refer her to Dr. Derrek at [facility] for a rheumatology consultation. Recommend massage therapy for muscle tightness and provide contact information for [facility] at [place].  Pain and limited range of motion, right knee after knee replacement   She experiences persistent pain and limited range of motion in the right knee following knee replacement surgery in April of last year. Dr. Jonette recently evaluated her, aspirated fluid from the knee, and is awaiting results to determine if there is loosening of components or other issues. Await the results of the knee fluid analysis by Dr. Jonette to determine further management.    Chang Tiggs R Lowne Chase,  DO

## 2024-07-05 NOTE — Patient Instructions (Signed)
Place neck pain patient instructions here.  

## 2024-07-07 ENCOUNTER — Telehealth: Payer: Self-pay

## 2024-07-07 NOTE — Telephone Encounter (Signed)
 Spoke with pt , stated she will stop by office and sign medical release form

## 2024-07-07 NOTE — Telephone Encounter (Signed)
 Patient would need to fill out and sign a medical records release form.  She can contact the medical records department directly [phone]  Thanks!

## 2024-07-07 NOTE — Telephone Encounter (Signed)
 Patient last seen by Dr.Deveshwar in 2019   Copied from CRM #[id number]. Topic: Medical Record Request - Provider/Facility Request >> Jul 07, 2024  7:48 AM Laymon HERO wrote: Reason for CRM: [facility]- they need Prior rheumatology records Fax # [phone]

## 2024-07-08 ENCOUNTER — Telehealth: Payer: Self-pay

## 2024-07-08 ENCOUNTER — Ambulatory Visit: Payer: Self-pay | Admitting: Family Medicine

## 2024-07-08 NOTE — Telephone Encounter (Signed)
 Copied from CRM [id number]. Topic: General - Other >> Jul 08, 2024  9:08 AM Berneda FALCON wrote: Reason for CRM: Santana from [facility] states she need prior rheumatology notes before they can get her scheduled. Do have openings on Monday if we can send that over to her.  Fax-[phone] [phone] extension [phone]

## 2024-07-08 NOTE — Telephone Encounter (Signed)
 Chart reviewed, I don't see that Pt has seen another rheumatology office. OV note and x-ray from 07/02/24 faxed to number provided.

## 2024-07-12 ENCOUNTER — Other Ambulatory Visit: Payer: Self-pay | Admitting: Family Medicine

## 2024-07-20 ENCOUNTER — Encounter: Payer: Self-pay | Admitting: Family Medicine

## 2024-07-20 ENCOUNTER — Telehealth: Admitting: Family Medicine

## 2024-07-20 VITALS — BP 133/88 | Ht 62.0 in | Wt 194.0 lb

## 2024-07-20 DIAGNOSIS — J069 Acute upper respiratory infection, unspecified: Secondary | ICD-10-CM

## 2024-07-20 MED ORDER — DOXYCYCLINE HYCLATE 100 MG PO TABS: 100.0000 mg | ORAL_TABLET | Freq: Two times a day (BID) | ORAL | 0 refills | Status: AC

## 2024-07-20 MED ORDER — BENZONATATE 100 MG PO CAPS: 100.0000 mg | ORAL_CAPSULE | Freq: Two times a day (BID) | ORAL | 0 refills | Status: DC | PRN

## 2024-07-20 NOTE — Progress Notes (Addendum)
 Virtual Video Visit via MyChart Note - converted to audio only because patient could not access video   I connected with  [name] on 07/20/24 at 12:40 PM EDT by the video enabled telemedicine application for MyChart, and verified that I am speaking with the correct person using two identifiers.   I introduced myself as a Publishing Rights Manager with the practice. We discussed the limitations of evaluation and management by telemedicine and the availability of in person appointments. The patient expressed understanding and agreed to proceed.  Participating parties in this visit include: The patient and the nurse practitioner listed.  The patient is: At home I am: In the office - [facility]  Subjective:    CC:  Chief Complaint  Patient presents with   Cough   Nasal Congestion    HPI: [name] is a 68 y.o. year old female presenting today via MyChart today for cough, .  Upper Respiratory Infection: Patient complains of symptoms of a URI. Symptoms include congestion, cough, and wheezing. Onset of symptoms was 8 days ago, unchanged since that time. She also c/o congestion, nasal congestion, productive cough with  yellow and green colored sputum, sinus pressure, and wheezing for the past 8 days .  She is drinking plenty of fluids. Evaluation to date: none. Treatment to date: Mucinex, Tylenol .      Past medical history, Surgical history, Family history not pertinant except as noted below, Social history, Allergies, and medications have been entered into the medical record, reviewed, and corrections made.   Review of Systems:  All review of systems negative except what is listed in the HPI   Objective:    General:  Speaking clearly in complete sentences. Absent shortness of breath noted.   Alert and oriented x3.   Normal judgment.  Absent acute distress.   Impression and Recommendations:    Problem List Items Addressed This Visit    None Visit Diagnoses       Upper respiratory tract infection, unspecified type    -  Primary   Relevant Medications   doxycycline (VIBRA-TABS) 100 MG tablet   benzonatate  (TESSALON ) 100 MG capsule       Given duration of symptoms, will treat as possible sinusitis with URI/cough. Adding doxycycline and tessalon . Continue Mucinex and PRN tylenol . Continue supportive measures including rest, hydration, humidifier use, steam showers, warm compresses to sinuses, warm liquids with lemon and honey, and over-the-counter cough, cold, and analgesics as needed.   Patient aware of signs/symptoms requiring further/urgent evaluation.      Follow-up if symptoms worsen or fail to improve.    I discussed the assessment and treatment plan with the patient. The patient was provided an opportunity to ask questions and all were answered. The patient agreed with the plan and demonstrated an understanding of the instructions.   The patient was advised to call back or seek an in-person evaluation if the symptoms worsen or if the condition fails to improve as anticipated.   Courtney KATHEE Mon, NP     I spent 20 minutes dedicated to the care of this patient on the date of this encounter to include pre-visit chart review of prior notes and results, face-to-face time with the patient performing a medically appropriate exam, counseling/education regarding URI/sinusitis, and post-visit documentation and ordering of medication as indicated.

## 2024-07-20 NOTE — Progress Notes (Signed)
 Started last Tuesday Congestion, wheezing  Phlegm green  Cough (mainly at night)  Taking mucinex and tylenol 

## 2024-08-24 ENCOUNTER — Telehealth: Admitting: Family Medicine

## 2024-08-24 DIAGNOSIS — E66812 Obesity, class 2: Secondary | ICD-10-CM | POA: Diagnosis not present

## 2024-08-24 DIAGNOSIS — I2510 Atherosclerotic heart disease of native coronary artery without angina pectoris: Secondary | ICD-10-CM

## 2024-08-24 DIAGNOSIS — I10 Essential (primary) hypertension: Secondary | ICD-10-CM

## 2024-08-24 DIAGNOSIS — E785 Hyperlipidemia, unspecified: Secondary | ICD-10-CM

## 2024-08-24 DIAGNOSIS — R739 Hyperglycemia, unspecified: Secondary | ICD-10-CM | POA: Diagnosis not present

## 2024-08-24 DIAGNOSIS — G4733 Obstructive sleep apnea (adult) (pediatric): Secondary | ICD-10-CM

## 2024-08-24 DIAGNOSIS — M450 Ankylosing spondylitis of multiple sites in spine: Secondary | ICD-10-CM | POA: Diagnosis not present

## 2024-08-24 DIAGNOSIS — Z6836 Body mass index (BMI) 36.0-36.9, adult: Secondary | ICD-10-CM

## 2024-08-24 MED ORDER — TIRZEPATIDE-WEIGHT MANAGEMENT 2.5 MG/0.5ML ~~LOC~~ SOLN
0.2500 mg | SUBCUTANEOUS | 0 refills | Status: DC
Start: 2024-08-24 — End: 2024-08-25

## 2024-08-24 NOTE — Assessment & Plan Note (Signed)
 Mounjaro  refilled Return to office 3 months

## 2024-08-24 NOTE — Assessment & Plan Note (Signed)
 Stable

## 2024-08-24 NOTE — Progress Notes (Signed)
 MyChart Video Visit    Virtual Visit via Video Note   This patient is at least at moderate risk for complications without adequate follow up. This format is felt to be most appropriate for this patient at this time. Physical exam was limited by quality of the video and audio technology used for the visit. [name] was able to get the patient set up on a video visit.  Patient location: home Patient and provider in visit Provider location: Office  I discussed the limitations of evaluation and management by telemedicine and the availability of in person appointments. The patient expressed understanding and agreed to proceed.  Visit Date: 08/24/2024  Today's healthcare provider: Jamee JONELLE Antonio Cyndee, DO     Subjective:    Patient ID: [name], female    DOB: [birth year], 68 y.o.   MRN: [id number]  No chief complaint on file.   HPI Patient is in today for f/u knee pain,  Right.  Discussed the use of AI scribe software for clinical note transcription with the patient, who gave verbal consent to proceed.  History of Present Illness [name] is a 68 year old female who presents for pre-surgical evaluation for a knee revision surgery.  She experiences ongoing discomfort in her right knee, which was replaced nearly two years ago. Despite the initial surgery, she has not experienced relief from the discomfort. Her orthopedic surgeon informed her that the components of the knee replacement may be slipping, and she is scheduled for a revision surgery on February 18th.  She is concerned about her weight, which her surgeon emphasized needs to be reduced to prolong the life of her hip and pelvis replacements. She inquires about the possibility of using medication to assist with weight loss, noting that her insurance may cover it if linked to comorbidities such as hypertension and sleep apnea.  She has a history of sleep apnea and uses a CPAP machine, although she has not used it  for several months. She reports that her sleep apnea was previously moderate and improved with weight loss, but she has recently gained weight and wonders if her sleep apnea has worsened.    Past Medical History:  Diagnosis Date   Abdominal pain, other specified site 03/31/2008   Centricity Description: FLANK PAIN, RIGHT Qualifier: Diagnosis of  By: [name] MD, Elsie   Centricity Description: ABDOMINAL PAIN OTHER SPECIFIED SITE Qualifier: Diagnosis of  By: Antonio ROSALEA Jamee     Acute sprain or strain of cervical region 04/10/2012   ALLERGIC RHINITIS 06/26/2007   Qualifier: Diagnosis of  By: Wilhemina RMA, Lucy     ANKYLOSING SPONDYLITIS 03/31/2008   Qualifier: Diagnosis of  By: [name] MD, Elsie     Ankylosing spondylitis (HCC)    neck and spin   Ankylosing spondylitis of multiple sites in spine [facility])    Arthritis    Back pain    Bariatric surgery status 01/14/2008   Qualifier: Diagnosis of  By: Antonio ROSALEA Jamee     Blood in stool 12/02/2008   Qualifier: Diagnosis of  By: Antonio ROSALEA Jamee     Blood transfusion without reported diagnosis    Calf pain 10/13/2012   CHEST PAIN UNSPECIFIED 10/11/2009   Qualifier: Diagnosis of  By: Debrah MD, Lamar BIRCH    CONSTIPATION 04/06/2010   Qualifier: Diagnosis of  By: Antonio ROSALEA Jamee     CORNEAL ABRASION, LEFT 01/22/2010   Qualifier: Diagnosis of  By: Antonio ROSALEA Jamee  Gastritis    GERD 06/26/2007   Qualifier: Diagnosis of  By: Wilhemina RMA, Lucy     GERD (gastroesophageal reflux disease)    Hiatal hernia    Hyperlipidemia    Hypertension    no medications now   IBS 06/26/2007   Qualifier: Diagnosis of  By: Wilhemina RMA, Lucy     Joint pain    Lactose intolerance    LEG CRAMPS, NOCTURNAL 11/14/2010   Qualifier: Diagnosis of  By: Josetta Corrigan     Long-term current use of steroids 04/20/2013   LYMPHADENOPATHY 06/10/2007   Qualifier: Diagnosis of  By: Antonio ROSALEA Rockers     MORBID OBESITY 06/26/2007   Qualifier: Diagnosis of  By: Wilhemina SHARALYN Aspen     MUSCLE PAIN 03/31/2008   Qualifier: Diagnosis of  By: [name] MD, William     Noninfectious gastroenteritis and colitis 03/17/2013   Obesity (BMI 30-39.9) 03/16/2014   OSA (obstructive sleep apnea) 03/08/2016   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/23/2010   Qualifier: Diagnosis of  By: Antonio ROSALEA Rockers     Rheumatoid arthritis (HCC)    Seasonal allergies 05/14/2015   THYROID  CYST 06/26/2007   Qualifier: Diagnosis of  By: Wilhemina RMA, Lucy     THYROID  NODULE, HX OF 06/10/2007   Qualifier: Diagnosis of  By: Antonio ROSALEA Rockers     Vitamin D  deficiency     Past Surgical History:  Procedure Laterality Date   BARIATRIC SURGERY     BREAST REDUCTION SURGERY  1996   COLONOSCOPY  10/24/2009   kaplan-normal exam   ESOPHAGOGASTRODUODENOSCOPY     pelvis replacement  2010   TOTAL HIP ARTHROPLASTY     x 2 left, one revision   TOTAL KNEE ARTHROPLASTY Right 01/28/2023   Procedure: TOTAL KNEE ARTHROPLASTY;  Surgeon: Ernie Cough, MD;  Location: [facility];  Service: Orthopedics;  Laterality: Right;   UPPER GASTROINTESTINAL ENDOSCOPY      Family History  Problem Relation Age of Onset   Hypertension Mother    High Cholesterol Mother    Diabetes Mother    Bladder Cancer Mother    Cancer Father    Arthritis Father    Hypertension Father    Diabetes Father    High Cholesterol Father    Dementia Father    Hypertension Sister    Diabetes Sister    Melanoma Maternal Grandmother    Breast cancer Maternal Grandmother        and Aunt   Colon cancer Neg Hx    Esophageal cancer Neg Hx    Rectal cancer Neg Hx    Stomach cancer Neg Hx    Colon polyps Neg Hx     Social History   Socioeconomic History   Marital status: Married    Spouse name: Atira Borello   Number of children: Not on file   Years of education: Not on file   Highest education level: Master's degree (e.g., MA, MS, MEng, MEd, MSW, MBA)  Occupational History   Occupation: retired  Tobacco Use   Smoking status:  Never   Smokeless tobacco: Never  Vaping Use   Vaping status: Never Used  Substance and Sexual Activity   Alcohol use: No    Alcohol/week: 0.0 standard drinks of alcohol   Drug use: No   Sexual activity: Yes  Other Topics Concern   Not on file  Social History Narrative   Occupation: Sports Coach   Daily Caffeine use- 1 cup daily  Social Drivers of Corporate Investment Banker Strain: Low Risk  (06/22/2024)   Overall Financial Resource Strain (CARDIA)    Difficulty of Paying Living Expenses: Not hard at all  Food Insecurity: No Food Insecurity (06/22/2024)   Hunger Vital Sign    Worried About Running Out of Food in the Last Year: Never true    Ran Out of Food in the Last Year: Never true  Transportation Needs: No Transportation Needs (06/22/2024)   PRAPARE - Administrator, Civil Service (Medical): No    Lack of Transportation (Non-Medical): No  Physical Activity: Inactive (06/22/2024)   Exercise Vital Sign    Days of Exercise per Week: 0 days    Minutes of Exercise per Session: 0 min  Stress: No Stress Concern Present (06/22/2024)   [employer] of Occupational Health - Occupational Stress Questionnaire    Feeling of Stress: Not at all  Recent Concern: Stress - Stress Concern Present (04/22/2024)   [employer] of Occupational Health - Occupational Stress Questionnaire    Feeling of Stress: To some extent  Social Connections: Socially Integrated (06/22/2024)   Social Connection and Isolation Panel    Frequency of Communication with Friends and Family: More than three times a week    Frequency of Social Gatherings with Friends and Family: More than three times a week    Attends Religious Services: More than 4 times per year    Active Member of [employer] or Organizations: Yes    Attends Engineer, Structural: More than 4 times per year    Marital Status: Married  Catering Manager Violence: Not At Risk (06/22/2024)   Humiliation, Afraid, Rape,  and Kick questionnaire    Fear of Current or Ex-Partner: No    Emotionally Abused: No    Physically Abused: No    Sexually Abused: No    Outpatient Medications Prior to Visit  Medication Sig Dispense Refill   bariatric multiple vitamin (ADVANCED MULTI EA) CHEW chewable tablet Chew 2 tablets by mouth daily.     benzonatate  (TESSALON ) 100 MG capsule Take 1 capsule (100 mg total) by mouth 2 (two) times daily as needed for cough. 20 capsule 0   Biotin w/ Vitamins C & E (HAIR SKIN & NAILS GUMMIES PO) Take 1 tablet by mouth daily.     Calcium  Carb-Cholecalciferol (CALTRATE 600+D3 SOFT PO) Take 1 tablet by mouth daily.     chlorthalidone  (HYGROTON ) 25 MG tablet Take 1/2 (one-half) tablet by mouth once daily 15 tablet 2   cyclobenzaprine  (FLEXERIL ) 10 MG tablet Take 1 tablet (10 mg total) by mouth 3 (three) times daily as needed for muscle spasms. 30 tablet 0   esomeprazole  (NEXIUM ) 40 MG capsule Take 1 capsule (40 mg total) by mouth daily as needed. Crack open capsule and ingest contents. Please keep your January appointment for further refills. Thank you 90 capsule 1   hydrOXYzine  (ATARAX ) 10 MG tablet Take 1 tablet (10 mg total) by mouth 3 (three) times daily as needed for itching. 30 tablet 0   losartan  (COZAAR ) 50 MG tablet Take 1 tablet (50 mg total) by mouth daily. 30 tablet 2   polyethylene glycol (MIRALAX  / GLYCOLAX ) 17 g packet Take 17 g by mouth 2 (two) times daily. 14 each 0   rosuvastatin  (CRESTOR ) 20 MG tablet Take 1 tablet (20 mg total) by mouth daily. 30 tablet 5   sucralfate  (CARAFATE ) 1 g tablet Take 1 g by mouth 4 (four) times daily -  with meals and at bedtime.     tirzepatide  (ZEPBOUND ) 2.5 MG/0.5ML injection vial Inject 0.25 mg into the skin once a week. (Patient not taking: Reported on 07/20/2024) 2 mL 0   No facility-administered medications prior to visit.    Allergies  Allergen Reactions   Aspirin     REACTION: intolerance-gi upset   Codeine Itching   Morphine Itching    Norvasc  [Amlodipine ]    Sulfa Antibiotics Other (See Comments)    Unsure of reaction   Sulfonamide Derivatives     Unsure of reaction    Review of Systems  Constitutional:  Negative for fever and malaise/fatigue.  HENT:  Negative for congestion.   Eyes:  Negative for blurred vision.  Respiratory:  Negative for cough and shortness of breath.   Cardiovascular:  Negative for chest pain, palpitations and leg swelling.  Gastrointestinal:  Negative for vomiting.  Musculoskeletal:  Positive for joint pain. Negative for back pain.  Skin:  Negative for rash.  Neurological:  Negative for loss of consciousness and headaches.       Objective:    Physical Exam Vitals and nursing note reviewed.  Neurological:     General: No focal deficit present.     Mental Status: She is oriented to person, place, and time.  Psychiatric:        Mood and Affect: Mood normal.        Thought Content: Thought content normal.     There were no vitals taken for this visit. Wt Readings from Last 3 Encounters:  07/20/24 194 lb (88 kg)  07/02/24 194 lb (88 kg)  06/22/24 186 lb (84.4 kg)       Assessment & Plan:  Obstructive sleep apnea syndrome -     Tirzepatide -Weight Management; Inject 0.25 mg into the skin once a week.  Dispense: 2 mL; Refill: 0  Class 2 severe obesity due to excess calories with serious comorbidity and body mass index (BMI) of 36.0 to 36.9 in adult -     Tirzepatide -Weight Management; Inject 0.25 mg into the skin once a week.  Dispense: 2 mL; Refill: 0  Ankylosing spondylitis of multiple sites in spine [facility]) Assessment & Plan: Stable    Hyperglycemia  ASCVD (arteriosclerotic cardiovascular disease)  Hyperlipidemia, unspecified hyperlipidemia type Assessment & Plan: Encourage heart healthy diet such as MIND or DASH diet, increase exercise, avoid trans fats, simple carbohydrates and processed foods, consider a krill or fish or flaxseed oil cap daily.     Essential  hypertension Assessment & Plan: Well controlled, no changes to meds. Encouraged heart healthy diet such as the DASH diet and exercise as tolerated.     Obstructive sleep apnea Assessment & Plan: Mounjaro  refilled Return to office 3 months      Assessment and Plan Assessment & Plan Right knee prosthesis mechanical complication   She is scheduled for revision surgery on February 18th due to suspected component slippage causing persistent discomfort. Preoperative workup is required. Presurgical testing, including EKG and blood work, will be scheduled within 90 days of surgery. Coordination with Dr. Jonette for preoperative workup requirements is planned. Post-surgery, physical therapy will be arranged at a convenient location.  Obesity, class 2   Obesity management is crucial for improving joint health and surgical outcomes. Dietary changes focusing on protein intake were discussed, and potential medication coverage due to comorbidities, including sleep apnea and hypertension, was considered. Preauthorization for weight loss medication has been submitted. Dietary changes will be implemented as recommended by Dr.  Mason.  Obstructive sleep apnea   Previously managed with CPAP, which is currently not in use. Sleep apnea may qualify for weight loss medication coverage. A follow-up sleep study will reassess sleep apnea severity. CPAP usage will be documented for insurance purposes.  Hypertension   May qualify for weight loss medication coverage.  History of gastric bypass surgery   Gastric bypass surgery with potential vitamin deficiencies. Previous vitamin level checks were challenging due to blood draw logistics. A comprehensive vitamin level check is planned during the next physical examination.   I discussed the assessment and treatment plan with the patient. The patient was provided an opportunity to ask questions and all were answered. The patient agreed with the plan and demonstrated an  understanding of the instructions.   The patient was advised to call back or seek an in-person evaluation if the symptoms worsen or if the condition fails to improve as anticipated.  Jamee JONELLE Antonio Cyndee, DO [place] [facility] [phone] (phone) [phone] (fax)  [facility]

## 2024-08-24 NOTE — Assessment & Plan Note (Signed)
 Encourage heart healthy diet such as MIND or DASH diet, increase exercise, avoid trans fats, simple carbohydrates and processed foods, consider a krill or fish or flaxseed oil cap daily.

## 2024-08-24 NOTE — Assessment & Plan Note (Signed)
 Well controlled, no changes to meds. Encouraged heart healthy diet such as the DASH diet and exercise as tolerated.

## 2024-08-25 ENCOUNTER — Telehealth: Payer: Self-pay

## 2024-08-25 ENCOUNTER — Telehealth: Payer: Self-pay | Admitting: Family Medicine

## 2024-08-25 MED ORDER — ZEPBOUND 2.5 MG/0.5ML ~~LOC~~ SOAJ: 2.5000 mg | SUBCUTANEOUS | 0 refills | Status: DC

## 2024-08-25 NOTE — Telephone Encounter (Signed)
 Copied from CRM #[id number]. Topic: Clinical - Prescription Issue >> Aug 24, 2024  5:50 PM Nessti S wrote: Reason for CRM: mary grace called because pt wants the tirzepatide  (ZEPBOUND ) pen instead so her insurance is able to cover it. Please contact pt concerning the med change.

## 2024-08-25 NOTE — Telephone Encounter (Signed)
 Copied from CRM #[id number]. Topic: Appointments - Scheduling Inquiry for Clinic >> Aug 25, 2024 10:02 AM Herma G wrote: Reason for CRM: [facility] requested for pt's Physical appt on 1/26 to also be considered as a medical clearance appt. Clinic will fax over paperwork.

## 2024-08-25 NOTE — Telephone Encounter (Signed)
 Rx was sent as Zepbound  2.5mg  however was sent as vials. Rx changed to pens.

## 2024-08-25 NOTE — Telephone Encounter (Signed)
 Can we do this ?

## 2024-08-25 NOTE — Telephone Encounter (Signed)
OV note and labs faxed

## 2024-09-07 ENCOUNTER — Telehealth: Payer: Self-pay

## 2024-09-07 NOTE — Telephone Encounter (Signed)
 Pt has been informed.

## 2024-09-07 NOTE — Telephone Encounter (Signed)
 Copied from CRM #[id number]. Topic: General - Other >> Sep 07, 2024 10:06 AM [name] wrote: Reason for CRM: Pt called in stating that she is needing handicap placard signed. Pt is wanting to know when can she bring it by.

## 2024-09-07 NOTE — Telephone Encounter (Signed)
 Pt wanting to drop off handicap placard form, looks like she is having upcoming knee surgery, would you like me to have ortho complete that?

## 2024-09-07 NOTE — Telephone Encounter (Signed)
 Can you let patient know it is okay to drop off form please?

## 2024-09-09 ENCOUNTER — Telehealth: Payer: Self-pay | Admitting: Family Medicine

## 2024-09-09 NOTE — Telephone Encounter (Signed)
 Received

## 2024-09-09 NOTE — Telephone Encounter (Signed)
 Pt dropped off paper to be filled out by pcp. Left paper in pcps box. Please call when paper is ready to be picked up.

## 2024-09-13 ENCOUNTER — Encounter: Payer: Self-pay | Admitting: Family Medicine

## 2024-09-13 ENCOUNTER — Ambulatory Visit: Admitting: Family Medicine

## 2024-09-13 VITALS — BP 120/84 | HR 81 | Temp 97.9°F | Resp 16 | Ht 62.0 in | Wt 198.2 lb

## 2024-09-13 DIAGNOSIS — I10 Essential (primary) hypertension: Secondary | ICD-10-CM

## 2024-09-13 DIAGNOSIS — K219 Gastro-esophageal reflux disease without esophagitis: Secondary | ICD-10-CM | POA: Diagnosis not present

## 2024-09-13 DIAGNOSIS — Z01818 Encounter for other preprocedural examination: Secondary | ICD-10-CM

## 2024-09-13 DIAGNOSIS — G4733 Obstructive sleep apnea (adult) (pediatric): Secondary | ICD-10-CM

## 2024-09-13 MED ORDER — TIRZEPATIDE-WEIGHT MANAGEMENT 5 MG/0.5ML ~~LOC~~ SOLN: 5.0000 mg | SUBCUTANEOUS | 1 refills | Status: DC

## 2024-09-13 MED ORDER — SUCRALFATE 1 G PO TABS: 1.0000 g | ORAL_TABLET | Freq: Three times a day (TID) | ORAL | 3 refills | Status: AC

## 2024-09-13 MED ORDER — LOSARTAN POTASSIUM 50 MG PO TABS: 50.0000 mg | ORAL_TABLET | Freq: Every day | ORAL | 2 refills | Status: AC

## 2024-09-13 NOTE — Progress Notes (Unsigned)
 Subjective:    Patient ID: [name], female    DOB: 11/22/1955, 68 y.o.   MRN: [id number]  Chief Complaint  Patient presents with   Pre-op Exam    HPI Patient is in today for pre surgical clearance.  Discussed the use of AI scribe software for clinical note transcription with the patient, who gave verbal consent to proceed.  History of Present Illness [name] is a 68 year old female who presents with knee pain and swelling.  She has experienced persistent swelling and burning pain in her knee since her knee replacement surgery nearly two years ago. The swelling is constant, and the pain is described as burning 'like fire.' Her knee and calf are swollen, and she has gained weight, which she attributes to the swelling. She perceives a leg length discrepancy, which she attributes to the knee replacement.  She has started taking Zepbound  to aid in weight loss. She has an upcoming appointment for a sleep study to address her sleep apnea, initially diagnosed as moderate and later as mild after weight loss. She previously used a CPAP machine but stopped when her condition was mild.  She is also seeing a rheumatologist for ankylosing spondylitis, which has affected her hand mobility, making it difficult to wear rings. She is concerned about potential changes in her fingers and hopes for improved neck motion.  She has a history of elevated lipoprotein A levels. She is not currently taking cholesterol medication and is attempting to manage her cholesterol through dietary changes, such as reducing cheese and butter intake.    Past Medical History:  Diagnosis Date   Abdominal pain, other specified site 03/31/2008   Centricity Description: FLANK PAIN, RIGHT Qualifier: Diagnosis of  By: [name] MD, Elsie   Centricity Description: ABDOMINAL PAIN OTHER SPECIFIED SITE Qualifier: Diagnosis of  By: Antonio ROSALEA Rockers     Acute sprain or strain of cervical region 04/10/2012   ALLERGIC  RHINITIS 06/26/2007   Qualifier: Diagnosis of  By: Wilhemina RMA, Lucy     ANKYLOSING SPONDYLITIS 03/31/2008   Qualifier: Diagnosis of  By: [name] MD, William     Ankylosing spondylitis (HCC)    neck and spin   Ankylosing spondylitis of multiple sites in spine [facility])    Arthritis    Back pain    Bariatric surgery status 01/14/2008   Qualifier: Diagnosis of  By: Antonio DO, Rockers     Blood in stool 12/02/2008   Qualifier: Diagnosis of  By: Antonio ROSALEA Rockers     Blood transfusion without reported diagnosis    Calf pain 10/13/2012   CHEST PAIN UNSPECIFIED 10/11/2009   Qualifier: Diagnosis of  By: Debrah MD, Lamar BIRCH    CONSTIPATION 04/06/2010   Qualifier: Diagnosis of  By: Antonio ROSALEA Rockers     CORNEAL ABRASION, LEFT 01/22/2010   Qualifier: Diagnosis of  By: Antonio ROSALEA Rockers     Gastritis    GERD 06/26/2007   Qualifier: Diagnosis of  By: Wilhemina RMA, Lucy     GERD (gastroesophageal reflux disease)    Hiatal hernia    Hyperlipidemia    Hypertension    no medications now   IBS 06/26/2007   Qualifier: Diagnosis of  By: Wilhemina RMA, Lucy     Joint pain    Lactose intolerance    LEG CRAMPS, NOCTURNAL 11/14/2010   Qualifier: Diagnosis of  By: Josetta Corrigan     Long-term current use of steroids 04/20/2013   LYMPHADENOPATHY 06/10/2007  Qualifier: Diagnosis of  By: Antonio ROSALEA Rockers     MORBID OBESITY 06/26/2007   Qualifier: Diagnosis of  By: Wilhemina SHARALYN Aspen     MUSCLE PAIN 03/31/2008   Qualifier: Diagnosis of  By: [name] MD, William     Noninfectious gastroenteritis and colitis 03/17/2013   Obesity (BMI 30-39.9) 03/16/2014   OSA (obstructive sleep apnea) 03/08/2016   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/23/2010   Qualifier: Diagnosis of  By: Antonio ROSALEA Rockers     Rheumatoid arthritis (HCC)    Seasonal allergies 05/14/2015   THYROID  CYST 06/26/2007   Qualifier: Diagnosis of  By: Wilhemina RMA, Lucy     THYROID  NODULE, HX OF 06/10/2007   Qualifier: Diagnosis of  By: Antonio ROSALEA Rockers      Vitamin D  deficiency     Past Surgical History:  Procedure Laterality Date   BARIATRIC SURGERY     BREAST REDUCTION SURGERY  1996   COLONOSCOPY  10/24/2009   kaplan-normal exam   ESOPHAGOGASTRODUODENOSCOPY     pelvis replacement  2010   TOTAL HIP ARTHROPLASTY     x 2 left, one revision   TOTAL KNEE ARTHROPLASTY Right 01/28/2023   Procedure: TOTAL KNEE ARTHROPLASTY;  Surgeon: Ernie Cough, MD;  Location: [facility];  Service: Orthopedics;  Laterality: Right;   UPPER GASTROINTESTINAL ENDOSCOPY      Family History  Problem Relation Age of Onset   Hypertension Mother    High Cholesterol Mother    Diabetes Mother    Bladder Cancer Mother    Cancer Father    Arthritis Father    Hypertension Father    Diabetes Father    High Cholesterol Father    Dementia Father    Hypertension Sister    Diabetes Sister    Melanoma Maternal Grandmother    Breast cancer Maternal Grandmother        and Aunt   Colon cancer Neg Hx    Esophageal cancer Neg Hx    Rectal cancer Neg Hx    Stomach cancer Neg Hx    Colon polyps Neg Hx     Social History   Socioeconomic History   Marital status: Married    Spouse name: [name]   Number of children: Not on file   Years of education: Not on file   Highest education level: Master's degree (e.g., MA, MS, MEng, MEd, MSW, MBA)  Occupational History   Occupation: retired  Tobacco Use   Smoking status: Never   Smokeless tobacco: Never  Vaping Use   Vaping status: Never Used  Substance and Sexual Activity   Alcohol use: No    Alcohol/week: 0.0 standard drinks of alcohol   Drug use: No   Sexual activity: Yes  Other Topics Concern   Not on file  Social History Narrative   Occupation: Sports Coach   Daily Caffeine use- 1 cup daily      Social Drivers of Health   Tobacco Use: Low Risk (09/13/2024)   Patient History    Smoking Tobacco Use: Never    Smokeless Tobacco Use: Never    Passive Exposure: Not on file  Financial Resource  Strain: Low Risk (06/22/2024)   Overall Financial Resource Strain (CARDIA)    Difficulty of Paying Living Expenses: Not hard at all  Food Insecurity: No Food Insecurity (06/22/2024)   Epic    Worried About Radiation Protection Practitioner of Food in the Last Year: Never true    Ran Out of Food in the  Last Year: Never true  Transportation Needs: No Transportation Needs (06/22/2024)   Epic    Lack of Transportation (Medical): No    Lack of Transportation (Non-Medical): No  Physical Activity: Inactive (06/22/2024)   Exercise Vital Sign    Days of Exercise per Week: 0 days    Minutes of Exercise per Session: 0 min  Stress: No Stress Concern Present (06/22/2024)   [employer] of Occupational Health - Occupational Stress Questionnaire    Feeling of Stress: Not at all  Recent Concern: Stress - Stress Concern Present (04/22/2024)   [employer] of Occupational Health - Occupational Stress Questionnaire    Feeling of Stress: To some extent  Social Connections: Socially Integrated (06/22/2024)   Social Connection and Isolation Panel    Frequency of Communication with Friends and Family: More than three times a week    Frequency of Social Gatherings with Friends and Family: More than three times a week    Attends Religious Services: More than 4 times per year    Active Member of Clubs or Organizations: Yes    Attends Banker Meetings: More than 4 times per year    Marital Status: Married  Catering Manager Violence: Not At Risk (06/22/2024)   Epic    Fear of Current or Ex-Partner: No    Emotionally Abused: No    Physically Abused: No    Sexually Abused: No  Depression (PHQ2-9): Low Risk (06/22/2024)   Depression (PHQ2-9)    PHQ-2 Score: 0  Alcohol Screen: Low Risk (06/22/2024)   Alcohol Screen    Last Alcohol Screening Score (AUDIT): 0  Housing: Unknown (06/22/2024)   Epic    Unable to Pay for Housing in the Last Year: No    Number of Times Moved in the Last Year: Not on file    Homeless in  the Last Year: No  Utilities: Not At Risk (06/22/2024)   Epic    Threatened with loss of utilities: No  Health Literacy: Adequate Health Literacy (06/22/2024)   B1300 Health Literacy    Frequency of need for help with medical instructions: Never    Outpatient Medications Prior to Visit  Medication Sig Dispense Refill   bariatric multiple vitamin (ADVANCED MULTI EA) CHEW chewable tablet Chew 2 tablets by mouth daily.     benzonatate  (TESSALON ) 100 MG capsule Take 1 capsule (100 mg total) by mouth 2 (two) times daily as needed for cough. 20 capsule 0   Biotin w/ Vitamins C & E (HAIR SKIN & NAILS GUMMIES PO) Take 1 tablet by mouth daily.     Calcium  Carb-Cholecalciferol (CALTRATE 600+D3 SOFT PO) Take 1 tablet by mouth daily.     chlorthalidone  (HYGROTON ) 25 MG tablet Take 1/2 (one-half) tablet by mouth once daily 15 tablet 2   cyclobenzaprine  (FLEXERIL ) 10 MG tablet Take 1 tablet (10 mg total) by mouth 3 (three) times daily as needed for muscle spasms. 30 tablet 0   esomeprazole  (NEXIUM ) 40 MG capsule Take 1 capsule (40 mg total) by mouth daily as needed. Crack open capsule and ingest contents. Please keep your January appointment for further refills. Thank you 90 capsule 1   hydrOXYzine  (ATARAX ) 10 MG tablet Take 1 tablet (10 mg total) by mouth 3 (three) times daily as needed for itching. 30 tablet 0   polyethylene glycol (MIRALAX  / GLYCOLAX ) 17 g packet Take 17 g by mouth 2 (two) times daily. 14 each 0   rosuvastatin  (CRESTOR ) 20 MG tablet Take 1 tablet (20  mg total) by mouth daily. 30 tablet 5   losartan  (COZAAR ) 50 MG tablet Take 1 tablet (50 mg total) by mouth daily. 30 tablet 2   sucralfate  (CARAFATE ) 1 g tablet Take 1 g by mouth 4 (four) times daily -  with meals and at bedtime.     tirzepatide  (ZEPBOUND ) 2.5 MG/0.5ML Pen Inject 2.5 mg into the skin once a week. 2 mL 0   No facility-administered medications prior to visit.    Allergies[1]  ROS     Objective:    Physical  Exam  BP 120/84 (BP Location: Left Arm, Patient Position: Sitting, Cuff Size: Large)   Pulse 81   Temp 97.9 F (36.6 C) (Oral)   Resp 16   Ht 5' 2 (1.575 m)   Wt 198 lb 3.2 oz (89.9 kg)   SpO2 100%   BMI 36.25 kg/m  Wt Readings from Last 3 Encounters:  09/13/24 198 lb 3.2 oz (89.9 kg)  07/20/24 194 lb (88 kg)  07/02/24 194 lb (88 kg)    Diabetic Foot Exam - Simple   No data filed    Lab Results  Component Value Date   WBC 5.2 04/23/2024   HGB 13.6 04/23/2024   HCT 41.3 04/23/2024   PLT 337.0 04/23/2024   GLUCOSE 91 04/23/2024   CHOL 169 04/23/2024   TRIG 60.0 04/23/2024   HDL 89.40 04/23/2024   LDLCALC 67 04/23/2024   ALT 16 04/23/2024   AST 18 04/23/2024   NA 137 04/23/2024   K 4.0 04/23/2024   CL 100 04/23/2024   CREATININE 0.76 04/23/2024   BUN 14 04/23/2024   CO2 29 04/23/2024   TSH 2.32 10/14/2023   INR 1.2 09/26/2008   HGBA1C 5.9 04/23/2024    Lab Results  Component Value Date   TSH 2.32 10/14/2023   Lab Results  Component Value Date   WBC 5.2 04/23/2024   HGB 13.6 04/23/2024   HCT 41.3 04/23/2024   MCV 95.4 04/23/2024   PLT 337.0 04/23/2024   Lab Results  Component Value Date   NA 137 04/23/2024   K 4.0 04/23/2024   CO2 29 04/23/2024   GLUCOSE 91 04/23/2024   BUN 14 04/23/2024   CREATININE 0.76 04/23/2024   BILITOT 0.7 04/23/2024   ALKPHOS 75 04/23/2024   AST 18 04/23/2024   ALT 16 04/23/2024   PROT 6.8 04/23/2024   ALBUMIN 4.0 04/23/2024   CALCIUM  9.6 04/23/2024   ANIONGAP 8 01/29/2023   EGFR 98 09/13/2022   GFR 82.12 04/23/2024   Lab Results  Component Value Date   CHOL 169 04/23/2024   Lab Results  Component Value Date   HDL 89.40 04/23/2024   Lab Results  Component Value Date   LDLCALC 67 04/23/2024   Lab Results  Component Value Date   TRIG 60.0 04/23/2024   Lab Results  Component Value Date   CHOLHDL 2 04/23/2024   Lab Results  Component Value Date   HGBA1C 5.9 04/23/2024       Assessment & Plan:   Pre-op exam -     EKG 12-Lead  Gastroesophageal reflux disease, unspecified whether esophagitis present -     Sucralfate ; Take 1 tablet (1 g total) by mouth 4 (four) times daily -  with meals and at bedtime.  Dispense: 120 tablet; Refill: 3  Essential hypertension -     Losartan  Potassium; Take 1 tablet (50 mg total) by mouth daily.  Dispense: 30 tablet; Refill: 2  OSA on CPAP -  Tirzepatide -Weight Management; Inject 5 mg into the skin once a week.  Dispense: 2 mL; Refill: 1  Assessment and Plan Assessment & Plan Preoperative evaluation for knee revision   She is scheduled for knee revision surgery due to suspected issues with knee components, experiencing persistent swelling and burning pain. No infection is detected. The revision likely involves only the top component, making it less invasive than a full knee replacement. She reports a leg length discrepancy, but no bone cutting is planned.  Obstructive sleep apnea   Previously diagnosed with moderate obstructive sleep apnea, now mild after weight loss. She is not using CPAP, and recent weight gain may be contributing to symptoms. An appointment is scheduled with a new sleep study doctor to discuss CPAP use. Attend the appointment to discuss CPAP use.  Hyperlipidemia with elevated lipoprotein(a)   Elevated lipoprotein(a) indicates a genetic risk for cardiovascular events. Previous cholesterol levels were good without medication. She is advised to maintain lower cholesterol levels due to genetic risk and is actively trying to lose weight and improve her diet. Continue dietary modifications to manage cholesterol levels and monitor them regularly.  Ankylosing spondylitis   She has long-standing ankylosing spondylitis with potential for improved neck mobility. Reports changes in fingers and concerns about hand function. She is under rheumatologist care for further evaluation and management. Continue follow-up with the rheumatologist for  management.  General Health Maintenance   Routine health maintenance was discussed, including vitamin level monitoring. She reports frequently dropping objects, possibly related to hand changes. Ordered vitamin level tests including B12, D, and magnesium .    Jamee SAUNDERS Lowne Chase, DO     [1]  Allergies Allergen Reactions   Aspirin     REACTION: intolerance-gi upset   Codeine Itching   Morphine Itching   Norvasc  [Amlodipine ]    Sulfa Antibiotics Other (See Comments)    Unsure of reaction   Sulfonamide Derivatives     Unsure of reaction

## 2024-09-14 ENCOUNTER — Ambulatory Visit: Payer: Self-pay | Admitting: Family Medicine

## 2024-09-14 LAB — LIPID PANEL
Cholesterol: 159 mg/dL (ref 28–200)
HDL: 88.6 mg/dL (ref >39.00)
LDL Cholesterol: 59 mg/dL (ref 10–99)
NonHDL: 70.03
Total CHOL/HDL Ratio: 2
Triglycerides: 56 mg/dL (ref 10.0–149.0)
VLDL: 11.2 mg/dL (ref 0.0–40.0)

## 2024-09-14 LAB — COMPREHENSIVE METABOLIC PANEL WITH GFR
ALT: 12 U/L (ref 3–35)
AST: 18 U/L (ref 5–37)
Albumin: 4.1 g/dL (ref 3.5–5.2)
Alkaline Phosphatase: 81 U/L (ref 39–117)
BUN: 20 mg/dL (ref 6–23)
CO2: 29 meq/L (ref 19–32)
Calcium: 10 mg/dL (ref 8.4–10.5)
Chloride: 102 meq/L (ref 96–112)
Creatinine, Ser: 0.97 mg/dL (ref 0.40–1.20)
GFR: 61.11 mL/min (ref >60.00)
Glucose, Bld: 87 mg/dL (ref 70–99)
Potassium: 4.5 meq/L (ref 3.5–5.1)
Sodium: 139 meq/L (ref 135–145)
Total Bilirubin: 0.4 mg/dL (ref 0.2–1.2)
Total Protein: 6.9 g/dL (ref 6.0–8.3)

## 2024-09-14 LAB — CBC WITH DIFFERENTIAL/PLATELET
Basophils Absolute: 0.1 K/uL (ref 0.0–0.1)
Basophils Relative: 1 % (ref 0.0–3.0)
Eosinophils Absolute: 0.1 K/uL (ref 0.0–0.7)
Eosinophils Relative: 2 % (ref 0.0–5.0)
HCT: 40.3 % (ref 36.0–46.0)
Hemoglobin: 13.4 g/dL (ref 12.0–15.0)
Lymphocytes Relative: 39.4 % (ref 12.0–46.0)
Lymphs Abs: 2 K/uL (ref 0.7–4.0)
MCHC: 33.1 g/dL (ref 30.0–36.0)
MCV: 95.7 fl (ref 78.0–100.0)
Monocytes Absolute: 0.5 K/uL (ref 0.1–1.0)
Monocytes Relative: 9.4 % (ref 3.0–12.0)
Neutro Abs: 2.5 K/uL (ref 1.4–7.7)
Neutrophils Relative %: 48.2 % (ref 43.0–77.0)
Platelets: 328 K/uL (ref 150.0–400.0)
RBC: 4.21 Mil/uL (ref 3.87–5.11)
RDW: 13.3 % (ref 11.5–15.5)
WBC: 5.1 K/uL (ref 4.0–10.5)

## 2024-09-14 LAB — MAGNESIUM: Magnesium: 2.2 mg/dL (ref 1.5–2.5)

## 2024-09-14 LAB — TSH: TSH: 1.57 u[IU]/mL (ref 0.35–5.50)

## 2024-09-14 LAB — APTT: aPTT: 28.5 s (ref 25.4–36.8)

## 2024-09-14 LAB — VITAMIN B12: Vitamin B-12: 1451 pg/mL — ABNORMAL HIGH (ref 211–911)

## 2024-09-14 LAB — VITAMIN D 25 HYDROXY (VIT D DEFICIENCY, FRACTURES): VITD: 34.02 ng/mL (ref 30.00–100.00)

## 2024-09-14 LAB — PROTIME-INR
INR: 1.1 ratio — ABNORMAL HIGH (ref 0.8–1.0)
Prothrombin Time: 11.4 s (ref 9.6–13.1)

## 2024-09-20 ENCOUNTER — Telehealth: Payer: Self-pay

## 2024-09-20 NOTE — Telephone Encounter (Signed)
 Preop form, OV, labs, and EKG faxed to [facility].

## 2024-09-20 NOTE — Telephone Encounter (Signed)
 Copied from CRM #[id number]. Topic: General - Other >> Sep 20, 2024 11:21 AM Eva FALCON wrote: Reason for CRM: Pt is calling in and states Dr. Antonio gave her a fax number that was different to the fax number that is on the [facility]. I did not see anything in her Chart and was wondering if someone could help out and see if we could get that fax number she is speaking of? Please call patient [phone].

## 2024-09-21 ENCOUNTER — Ambulatory Visit: Admitting: Pulmonary Disease

## 2024-09-21 ENCOUNTER — Telehealth: Payer: Self-pay

## 2024-09-21 ENCOUNTER — Encounter: Payer: Self-pay | Admitting: Pulmonary Disease

## 2024-09-21 ENCOUNTER — Other Ambulatory Visit (HOSPITAL_COMMUNITY): Payer: Self-pay

## 2024-09-21 ENCOUNTER — Encounter: Payer: Self-pay | Admitting: Family Medicine

## 2024-09-21 VITALS — BP 110/74 | HR 78 | Temp 98.0°F | Ht 62.0 in | Wt 194.0 lb

## 2024-09-21 DIAGNOSIS — R0683 Snoring: Secondary | ICD-10-CM | POA: Diagnosis not present

## 2024-09-21 NOTE — Progress Notes (Signed)
 "  Established Patient Pulmonology Office Visit   Subjective:  Patient ID: [name], female    DOB: 02-23-1956  MRN: [id number]  CC:  Chief Complaint  Patient presents with   Obstructive Sleep Apnea    Transfer of care    HPI 68 y.o. female with mild OSA (AHI 12-13/hr on most recent HST in 2023, though 15-16/hr by PSG in 2017 in REM-dominant pattern), ankylosing spondylitis, GERD, IBS, and HTN who presents for follow up.  She is not currently on CPAP therapy but would like to resume this due to recent worsening of her snoring and EDS symptoms (ESS 17 today) in the context of recent weight gain. She denies any sleepiness when driving.  Medication list is notable for: - Zepbound  for weight management - Losartan  + chlorthalidone  for hypertension  Pulm Questionnaires:     03/22/2020    9:00 AM  Results of the Epworth flowsheet  Sitting and reading 3  Watching TV 3  Sitting, inactive in a public place (e.g. a theatre or a meeting) 3  As a passenger in a car for an hour without a break 0  Lying down to rest in the afternoon when circumstances permit 3  Sitting and talking to someone 2  Sitting quietly after a lunch without alcohol 3  In a car, while stopped for a few minutes in traffic 0  Total score 17     Current Medications[1]      Objective:  BP 110/74   Pulse 78   Temp 98 F (36.7 C) (Oral)   Ht 5' 2 (1.575 m)   Wt 194 lb (88 kg)   SpO2 100%   BMI 35.48 kg/m    Physical Exam Constitutional:      Appearance: Normal appearance. She is obese.  HENT:     Head: Normocephalic and atraumatic.     Mouth/Throat:     Mouth: Mucous membranes are moist.     Pharynx: Oropharynx is clear. No oropharyngeal exudate or posterior oropharyngeal erythema.     Comments: MP IV Eyes:     General: No scleral icterus.    Conjunctiva/sclera: Conjunctivae normal.  Cardiovascular:     Heart sounds:     No friction rub.  Pulmonary:     Effort: Pulmonary effort is normal.  No respiratory distress.     Breath sounds: Normal breath sounds. No stridor. No wheezing, rhonchi or rales.  Neurological:     Mental Status: She is alert and oriented to person, place, and time.  Psychiatric:        Thought Content: Thought content normal.      Diagnostic Review:  RAR:Ojdu metabolic panel Lab Results  Component Value Date   WBC 5.1 09/13/2024   HGB 13.4 09/13/2024   HCT 40.3 09/13/2024   MCV 95.7 09/13/2024   MCH 31.2 01/30/2023   RDW 13.3 09/13/2024   PLT 328.0 09/13/2024   Metabolic Panel : Last metabolic panel Lab Results  Component Value Date   GLUCOSE 87 09/13/2024   NA 139 09/13/2024   K 4.5 09/13/2024   CL 102 09/13/2024   CO2 29 09/13/2024   BUN 20 09/13/2024   CREATININE 0.97 09/13/2024   GFR 61.11 09/13/2024   CALCIUM  10.0 09/13/2024   PHOS 4.2 02/04/2007   PROT 6.9 09/13/2024   ALBUMIN 4.1 09/13/2024   LABGLOB 2.9 04/01/2022   AGRATIO 1.4 04/01/2022   BILITOT 0.4 09/13/2024   ALKPHOS 81 09/13/2024   AST 18 09/13/2024  ALT 12 09/13/2024   ANIONGAP 8 01/29/2023   No results found for: BNP  Sleep Studies: HST (01/2022): AHI 12.8/hr. SpO2 nadir 85%. T89% 0.6 minutes. BMI 36.9 (202 lbs) at that time.  PSG (03/07/2016): REM-dominant OSA. AHI3% 15.9/hr. RDI 17.8/hr. BMI 35 (194 lbs) at that time.        Assessment & Plan:   Assessment & Plan Snoring Patient previously diagnosed with mild-to-moderate OSA but had stopped using her CPAP machine so she needs to requalify for treatment.  Since she does own a CPAP device from earlier treatment, she may resume use of her existing device at current settings. However, she will need to complete a HST to resume coverage of CPAP supplies.  Prior DME: [facility] Mask: Nasal pillows without chin strap Device: ResMed Airsense 10 Settings: AutoCPAP 4-10 cm H2O  Patient also is on Zepbound  for weight management which may help with reducing the severity of her OSA.  Orders:   Home  sleep test; Future    No follow-ups on file. Office will call patient with results of HST and to inform her when a prescription for CPAP supplies has been sent to [facility].   Time spent on day of this encounter (includes time spent face-to-face with the patient as well as time spent the same day as the encounter reviewing existing data and notes, and/or documenting my findings and the plan of care): 30 minutes   Lamar JINNY Dales, MD      [1]  Current Outpatient Medications:    bariatric multiple vitamin (ADVANCED MULTI EA) CHEW chewable tablet, Chew 2 tablets by mouth daily., Disp: , Rfl:    Biotin w/ Vitamins C & E (HAIR SKIN & NAILS GUMMIES PO), Take 1 tablet by mouth daily., Disp: , Rfl:    Calcium  Carb-Cholecalciferol (CALTRATE 600+D3 SOFT PO), Take 1 tablet by mouth daily., Disp: , Rfl:    chlorthalidone  (HYGROTON ) 25 MG tablet, Take 1/2 (one-half) tablet by mouth once daily, Disp: 15 tablet, Rfl: 2   cyclobenzaprine  (FLEXERIL ) 10 MG tablet, Take 1 tablet (10 mg total) by mouth 3 (three) times daily as needed for muscle spasms., Disp: 30 tablet, Rfl: 0   esomeprazole  (NEXIUM ) 40 MG capsule, Take 1 capsule (40 mg total) by mouth daily as needed. Crack open capsule and ingest contents. Please keep your January appointment for further refills. Thank you, Disp: 90 capsule, Rfl: 1   hydrOXYzine  (ATARAX ) 10 MG tablet, Take 1 tablet (10 mg total) by mouth 3 (three) times daily as needed for itching., Disp: 30 tablet, Rfl: 0   losartan  (COZAAR ) 50 MG tablet, Take 1 tablet (50 mg total) by mouth daily., Disp: 30 tablet, Rfl: 2   polyethylene glycol (MIRALAX  / GLYCOLAX ) 17 g packet, Take 17 g by mouth 2 (two) times daily., Disp: 14 each, Rfl: 0   sucralfate  (CARAFATE ) 1 g tablet, Take 1 tablet (1 g total) by mouth 4 (four) times daily -  with meals and at bedtime., Disp: 120 tablet, Rfl: 3   ZEPBOUND  2.5 MG/0.5ML injection vial, SMARTSIG:0.25 Milligram(s) SUB-Q Once a Week, Disp: , Rfl:    "

## 2024-09-21 NOTE — Telephone Encounter (Signed)
 Pharmacy Patient Advocate Encounter   Received notification from Patient Advice Request messages that prior authorization for Zepbound  2.5mg /0.45ml is required/requested.   Insurance verification completed.   The patient is insured through [facility].   Per test claim: PA required; PA submitted to above mentioned insurance via Latent Key/confirmation #/EOC [id number] Status is pending

## 2024-09-21 NOTE — Telephone Encounter (Signed)
 Pharmacy Patient Advocate Encounter  Received notification from [employer] that Prior Authorization for Zepbound  2.5mg /0.27ml has been APPROVED from 09/21/24 to 09/29/25   PA #/Case ID/Reference #: [id number]

## 2024-09-24 ENCOUNTER — Other Ambulatory Visit: Payer: Self-pay | Admitting: *Deleted

## 2024-09-24 MED ORDER — ZEPBOUND 5 MG/0.5ML ~~LOC~~ SOAJ: 5.0000 mg | SUBCUTANEOUS | 1 refills | Status: AC

## 2024-09-24 NOTE — Telephone Encounter (Signed)
 [facility] sent over a request for Zepbound  5mg .  Pt requesting pens.

## 2024-09-27 ENCOUNTER — Other Ambulatory Visit (HOSPITAL_COMMUNITY): Payer: Self-pay

## 2024-09-28 ENCOUNTER — Encounter: Payer: Self-pay | Admitting: Family Medicine

## 2024-09-29 ENCOUNTER — Other Ambulatory Visit: Payer: Self-pay | Admitting: Family Medicine

## 2024-10-22 ENCOUNTER — Telehealth: Payer: Self-pay | Admitting: Family Medicine

## 2024-10-22 NOTE — Telephone Encounter (Signed)
 Copied from CRM [id number]. Topic: General - Other >> Oct 22, 2024 12:54 PM Revonda D wrote: Reason for CRM: The pt is requesting a callback from [facility] or her nurse in regards to her appt scheduled on 10/25/24. Pt would like a callback today.

## 2024-10-25 ENCOUNTER — Encounter: Admitting: Family Medicine

## 2024-10-25 NOTE — Telephone Encounter (Signed)
 Appt cancelled, office closed d/t weather

## 2024-11-05 ENCOUNTER — Telehealth: Payer: Self-pay | Admitting: Pulmonary Disease

## 2024-11-05 ENCOUNTER — Other Ambulatory Visit: Payer: Self-pay | Admitting: *Deleted

## 2024-11-05 DIAGNOSIS — G4733 Obstructive sleep apnea (adult) (pediatric): Secondary | ICD-10-CM

## 2024-11-05 NOTE — Telephone Encounter (Signed)
 Please inform patient their HST has been completed, shows moderate obstructive sleep apnea.  Patient already has a CPAP device at home but needed sleep study / re-qualification to resume CPAP supplies via her DME company [employer]).  Office staff to send a prescription for CPAP 4-10 cm H2O with P-10 nasal pillows mask + chin strap to her DME company so she can resume receiving supplies. A new device is NOT needed at this time.  Needs a follow up appt with me in 3 months.

## 2024-11-05 NOTE — Telephone Encounter (Signed)
 I called and spoke with patient, advised of recommendations per Dr. Olena, she verbalized understanding.  I sent in an order for settings and supplies to [facility].  I also scheduled her follow up for May 12 at 11 am, advised to arrive by 10:45 am for check in she verbalized understanding.  She is having a knee replacement on 11/17/24.  Nothing further needed.

## 2025-02-08 ENCOUNTER — Ambulatory Visit: Admitting: Pulmonary Disease

## 2025-06-30 ENCOUNTER — Ambulatory Visit
